# Patient Record
Sex: Female | Born: 1955 | Race: Black or African American | Hispanic: No | Marital: Married | Smoking: Never smoker
Health system: Southern US, Community
[De-identification: ages and names within clinical notes are randomized; demographics above are authoritative.]

## PROBLEM LIST (undated history)

## (undated) DIAGNOSIS — M797 Fibromyalgia: Secondary | ICD-10-CM

## (undated) DIAGNOSIS — F32A Depression, unspecified: Secondary | ICD-10-CM

## (undated) DIAGNOSIS — G629 Polyneuropathy, unspecified: Secondary | ICD-10-CM

## (undated) DIAGNOSIS — IMO0001 Reserved for inherently not codable concepts without codable children: Secondary | ICD-10-CM

## (undated) DIAGNOSIS — I639 Cerebral infarction, unspecified: Secondary | ICD-10-CM

## (undated) DIAGNOSIS — K2960 Other gastritis without bleeding: Secondary | ICD-10-CM

## (undated) DIAGNOSIS — R569 Unspecified convulsions: Secondary | ICD-10-CM

## (undated) DIAGNOSIS — T8859XA Other complications of anesthesia, initial encounter: Secondary | ICD-10-CM

## (undated) DIAGNOSIS — R202 Paresthesia of skin: Secondary | ICD-10-CM

## (undated) DIAGNOSIS — M1990 Unspecified osteoarthritis, unspecified site: Secondary | ICD-10-CM

## (undated) DIAGNOSIS — T4145XA Adverse effect of unspecified anesthetic, initial encounter: Secondary | ICD-10-CM

## (undated) DIAGNOSIS — K589 Irritable bowel syndrome without diarrhea: Secondary | ICD-10-CM

## (undated) DIAGNOSIS — I209 Angina pectoris, unspecified: Secondary | ICD-10-CM

## (undated) DIAGNOSIS — G4730 Sleep apnea, unspecified: Secondary | ICD-10-CM

## (undated) DIAGNOSIS — F329 Major depressive disorder, single episode, unspecified: Secondary | ICD-10-CM

## (undated) DIAGNOSIS — G7000 Myasthenia gravis without (acute) exacerbation: Secondary | ICD-10-CM

## (undated) DIAGNOSIS — G459 Transient cerebral ischemic attack, unspecified: Secondary | ICD-10-CM

## (undated) DIAGNOSIS — K219 Gastro-esophageal reflux disease without esophagitis: Secondary | ICD-10-CM

## (undated) DIAGNOSIS — E612 Magnesium deficiency: Secondary | ICD-10-CM

## (undated) DIAGNOSIS — R200 Anesthesia of skin: Secondary | ICD-10-CM

## (undated) DIAGNOSIS — Z9889 Other specified postprocedural states: Secondary | ICD-10-CM

## (undated) DIAGNOSIS — Z8719 Personal history of other diseases of the digestive system: Secondary | ICD-10-CM

## (undated) DIAGNOSIS — M5430 Sciatica, unspecified side: Secondary | ICD-10-CM

## (undated) DIAGNOSIS — Z8489 Family history of other specified conditions: Secondary | ICD-10-CM

## (undated) DIAGNOSIS — J45909 Unspecified asthma, uncomplicated: Secondary | ICD-10-CM

## (undated) HISTORY — DX: Fibromyalgia: M79.7

## (undated) HISTORY — PX: HAND SURGERY: SHX662

## (undated) HISTORY — DX: Magnesium deficiency: E61.2

## (undated) HISTORY — DX: Other gastritis without bleeding: K29.60

## (undated) HISTORY — PX: ABDOMINAL HYSTERECTOMY: SHX81

## (undated) HISTORY — PX: NECK SURGERY: SHX720

## (undated) HISTORY — DX: Polyneuropathy, unspecified: G62.9

## (undated) HISTORY — PX: NISSEN FUNDOPLICATION: SHX2091

## (undated) HISTORY — PX: CHOLECYSTECTOMY: SHX55

## (undated) HISTORY — PX: OTHER SURGICAL HISTORY: SHX169

## (undated) HISTORY — DX: Irritable bowel syndrome, unspecified: K58.9

## (undated) HISTORY — PX: BLADDER SURGERY: SHX569

## (undated) HISTORY — DX: Other specified postprocedural states: Z98.890

---

## 2005-04-28 ENCOUNTER — Ambulatory Visit: Payer: Self-pay | Admitting: Orthopedic Surgery

## 2008-07-18 ENCOUNTER — Ambulatory Visit: Payer: Self-pay | Admitting: Gastroenterology

## 2008-11-07 DIAGNOSIS — G4730 Sleep apnea, unspecified: Secondary | ICD-10-CM

## 2008-11-07 HISTORY — DX: Sleep apnea, unspecified: G47.30

## 2009-02-05 DIAGNOSIS — Z9109 Other allergy status, other than to drugs and biological substances: Secondary | ICD-10-CM | POA: Insufficient documentation

## 2009-02-05 DIAGNOSIS — IMO0001 Reserved for inherently not codable concepts without codable children: Secondary | ICD-10-CM | POA: Insufficient documentation

## 2009-02-05 DIAGNOSIS — G5790 Unspecified mononeuropathy of unspecified lower limb: Secondary | ICD-10-CM | POA: Insufficient documentation

## 2009-02-11 ENCOUNTER — Ambulatory Visit: Payer: Self-pay | Admitting: Gastroenterology

## 2009-02-11 DIAGNOSIS — K589 Irritable bowel syndrome without diarrhea: Secondary | ICD-10-CM | POA: Insufficient documentation

## 2009-06-02 ENCOUNTER — Ambulatory Visit: Payer: Self-pay | Admitting: Gastroenterology

## 2009-06-02 DIAGNOSIS — R1310 Dysphagia, unspecified: Secondary | ICD-10-CM

## 2009-06-02 DIAGNOSIS — R1319 Other dysphagia: Secondary | ICD-10-CM | POA: Insufficient documentation

## 2009-06-02 DIAGNOSIS — K6289 Other specified diseases of anus and rectum: Secondary | ICD-10-CM | POA: Insufficient documentation

## 2009-06-03 ENCOUNTER — Encounter: Payer: Self-pay | Admitting: Gastroenterology

## 2009-06-03 ENCOUNTER — Telehealth: Payer: Self-pay | Admitting: Gastroenterology

## 2009-06-04 ENCOUNTER — Encounter: Payer: Self-pay | Admitting: Gastroenterology

## 2009-06-09 ENCOUNTER — Ambulatory Visit (HOSPITAL_COMMUNITY): Admission: RE | Admit: 2009-06-09 | Discharge: 2009-06-09 | Payer: Self-pay | Admitting: Gastroenterology

## 2009-06-25 ENCOUNTER — Telehealth (INDEPENDENT_AMBULATORY_CARE_PROVIDER_SITE_OTHER): Payer: Self-pay

## 2009-06-26 ENCOUNTER — Encounter: Payer: Self-pay | Admitting: Gastroenterology

## 2009-09-01 ENCOUNTER — Ambulatory Visit: Payer: Self-pay | Admitting: Gastroenterology

## 2009-09-01 DIAGNOSIS — K219 Gastro-esophageal reflux disease without esophagitis: Secondary | ICD-10-CM | POA: Insufficient documentation

## 2009-09-21 ENCOUNTER — Telehealth (INDEPENDENT_AMBULATORY_CARE_PROVIDER_SITE_OTHER): Payer: Self-pay

## 2009-10-28 ENCOUNTER — Ambulatory Visit: Payer: Self-pay | Admitting: Gastroenterology

## 2009-12-18 ENCOUNTER — Ambulatory Visit: Payer: Self-pay | Admitting: Gastroenterology

## 2009-12-24 ENCOUNTER — Telehealth (INDEPENDENT_AMBULATORY_CARE_PROVIDER_SITE_OTHER): Payer: Self-pay

## 2010-04-30 ENCOUNTER — Encounter (INDEPENDENT_AMBULATORY_CARE_PROVIDER_SITE_OTHER): Payer: Self-pay | Admitting: *Deleted

## 2010-11-07 DIAGNOSIS — G459 Transient cerebral ischemic attack, unspecified: Secondary | ICD-10-CM

## 2010-11-07 HISTORY — DX: Transient cerebral ischemic attack, unspecified: G45.9

## 2010-12-07 NOTE — Letter (Signed)
Summary: [facility]  [facility]   Imported By: Diana Eves 06/26/2009 09:54:47  _____________________________________________________________________  External Attachment:    Type:   Image     Comment:   External Document

## 2010-12-07 NOTE — Letter (Signed)
Summary: Recall Office Visit  [facility]  [address]   [place], [place] [address]   Phone: [phone]  Fax: [phone]      April 30, 2010   [facility] [address] [place], [place]  [address] [birth year]   Dear [name],   According to our records, it is time for you to schedule a follow-up office visit with [place].   At your convenience, please call [phone] to schedule an office visit. If you have any questions, concerns, or feel that this letter is in error, we would appreciate your call.   Sincerely,    Diana Eves  [facility] Ph: [phone]   Fax: [phone]

## 2010-12-07 NOTE — Assessment & Plan Note (Signed)
Summary: IBS-CONSTIPATION/REFLUX   Visit Type:  Follow-up Visit Primary Care Provider:  Olena Leatherwood, M.D.  Chief Complaint:  abd pain.  History of Present Illness: No paion doctor. Came to see dr. Olena Leatherwood and he changed her to Aciphex. Taking [name] 2 at bedtime. Imipramine 3 at bedtime. BM: q2days, hard and soft stools. Pain: @navel  and in epigastrium. Giving hemorrhoids: pressure and they are coming out. No bleeding.   Current Medications (verified): 1)  Metanx 2.8-25-2 Mg Tabs (L-Methylfolate-B6-B12) .... Once Daily 2)  Promethazine Hcl 25 Mg Tabs (Promethazine Hcl) .... As Needed 3)  Premarin 0.625 Mg Tabs (Estrogens Conjugated) .... Take 1 Tablet By Mouth Once A Day 4)  Xyzal 5 Mg Tabs (Levocetirizine Dihydrochloride) .... Take 1 Tablet By Mouth Once A Day 5)  Citrucel .... As Needed 6)  Vitamin B-12 500 Mcg Lozg (Cyanocobalamin) .... Once Daily 7)  [name] 24 Mcg Caps (Lubiprostone) .... 2 At Bedtime 8)  Melatonin 5 Mg Caps (Melatonin) .... Once Daily 9)  Gas-X 80 Mg Chew (Simethicone) .... As Needed 10)  Triamcinolone Acetonide 0.1 % Crea (Triamcinolone Acetonide) .... Once Daily 11)  Restasis 0.05 % Emul (Cyclosporine) .... As Needed 12)  Astelin 137 Mcg/spray Soln (Azelastine Hcl) .... Two Times A Day 13)  Imipramine Hcl 20 Mg Tabs (Imipramine Hcl) .... Take Three Tablets At Bedtime 14)  Asa 81 Mg .... Take 1 Tablet By Mouth Once A Day 15)  Carafate 1 Gm/48ml Susp (Sucralfate) .... Two Tbls Before Meals 16)  Citrate of Magnesia  Soln (Magnesium Citrate) .... As Needed 17)  Zantac 150 Mg Tabs (Ranitidine Hcl) .... Take Two Tablets Daily As Needed 18)  Mucinex D [phone] Mg Xr12h-Tab (Pseudoephedrine-Guaifenesin) .... Take 1 Tablet By Mouth Two Times A Day 19)  Epi Pen .... As Directed 20)  Aciphex 20 Mg Tbec (Rabeprazole Sodium) .... Once Daily  Allergies (verified): 1)  ! Sulfa 2)  ! Penicillin 3)  ! Tetracycline 4)  ! Ultram 5)  ! Codeine 6)  ! Darvocet 7)  ! *  Macrolides 8)  ! * Metronidazol 9)  ! Keflex 10)  ! * Cymbalta 11)  ! Morphine 12)  ! 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 Linna Darner 2010  Past Surgical History: Last updated: 10/28/2009 Nissen Fundoplication, 1993 Choleystectomy in 9/09 Esophageal Surgery at [facility] in 2008 Hysterectomy 1986  Family History: Family History of Breast Cancer: aunt FH of Colon Cancer: 4 cousins  Social History: Disabled Married  Review of Systems       Long time ago saw a Camera operator.   Vital Signs:  Patient profile:   55 year old female Height:      63 inches Weight:      159 pounds BMI:     28.27 Temp:     98.3 degrees F oral Pulse rate:   72 / minute BP sitting:   110 / 70  (left arm) Cuff size:   regular  Vitals Entered By: Hendricks Limes LPN (December 18, 2009 8:37 AM)  Physical Exam  General:  Well developed, well nourished, no acute distress. Head:  Normocephalic and atraumatic. Mouth:  No deformity or lesions. Neck:  Supple; no masses. Lungs:  Clear throughout to auscultation. Heart:  Regular rate and rhythm; no murmurs Abdomen:  Soft, nondistended. Normal bowel sounds. Extremities:  No edema noted. Neurologic:  Alert and  oriented x4;  grossly  normal neurologically.  Impression & Recommendations:  Problem # 1:  IRRITABLE BOWEL, PREDOMINANTLY CONSTIPATION (ICD-564.1) Assessment Unchanged  Continues to c/o abd pain. No warning Sx/signs-weight stable. Refer to [facility], Dr. Tora Perches. Consider mental health visit. Explained stress may exacerbate all her Sx. Continue Imipramine, [name], and Aciphex. Call Dr. Linna Darner and ask about the Aciphex. OPV in 4 mos.  CC: Hasanaj, M.D.   Orders: Est. Patient Level III (60454) Prescriptions: [name] 24 MCG CAPS (LUBIPROSTONE) 1 by mouth BID  #60 x 5   Entered and Authorized  by:   West Bali MD   Signed by:   West Bali MD on 12/18/2009   Method used:   Electronically to        [facility] (retail)       [address]       [place], [place]  [address]       Ph: [phone]       Fax: [phone]   RxID:   [id number]   Appended Document: IBS-CONSTIPATION/REFLUX AUG 2010 [facility] EGD** no dilation**-MODERATE gastritis APR 2009 [facility] TCS: DR. Linna Darner NORMAL OCT 2008 [facility] EGD/removal of impacted food bolus-Dr. Linna Darner SEP 2008 [facility] LAP CHOLY  Appended Document: IBS-CONSTIPATION/REFLUX APPT [facility] MAR 11-2P

## 2010-12-07 NOTE — Assessment & Plan Note (Signed)
Summary: FU OV 6 MO,IBS/AMS   Visit Type:  Follow-up Consult Primary Care Provider:  Camelia Eng, M.D.  Chief Complaint:  follow up and still having problems with stomach.  History of Present Illness: Yesenia Boyd has IBS-constipation predominant. She continues to complain of abd pain, and constipation. [name] helps but cause abd cramps. She hasd a BW daily with [name]. She has stress caring for her grandchild who is with her in the office today. She complains of bloating and swelling. Cymbalta "almost took me out of here." She is not taking stool softeners or Cirtucel.  Current Medications (verified): 1)  Metanx 2.8-25-2 Mg Tabs (L-Methylfolate-B6-B12) .... Take 1 Tablet By Mouth Two Times A Day 2)  Lidoderm 5 % Ptch (Lidocaine) .... As Needed 3)  Promethazine Hcl 25 Mg Tabs (Promethazine Hcl) .... As Needed 4)  Premarin 0.625 Mg Tabs (Estrogens Conjugated) .... Take 1 Tablet By Mouth Once A Day 5)  Xyzal 5 Mg Tabs (Levocetirizine Dihydrochloride) .... Take 1 Tablet By Mouth Once A Day 6)  Clarinex 5 Mg Tabs (Desloratadine) .... Take 1 Tablet By Mouth Once A Day 7)  Nasonex 50 Mcg/act Susp (Mometasone Furoate) .... As Needed 8)  Carafate 1 Gm Tabs (Sucralfate) .... As Needed 9)  Citrucel .... As Needed 10)  Vitamin B-12 500 Mcg Lozg (Cyanocobalamin) .... Once Daily 11)  Xyzal 5 Mg Tabs (Levocetirizine Dihydrochloride) .... Once Daily 12)  [name] 24 Mcg Caps (Lubiprostone) .... Once Daily 13)  Vitamin D 60454 Unit Caps (Ergocalciferol) .... Q Month 14)  Baclofen 10 Mg Tabs (Baclofen) .... Q Hs 15)  Melatonin 5 Mg Caps (Melatonin) .... Once Daily 16)  Gas-X 80 Mg Chew (Simethicone) .... As Needed 17)  Tylenol Pm Extra Strength 500-25 Mg Tabs (Diphenhydramine-Apap (Sleep)) .... Once Daily 18)  Triamcinolone Acetonide 0.1 % Crea (Triamcinolone Acetonide) .... Once Daily  Allergies: 1)  ! Sulfa 2)  ! Penicillin 3)  ! Tetracycline 4)  ! Ultram 5)  ! Codeine 6)  ! Darvocet 7)  ! *  Macrolides 8)  ! * Metronidazol 9)  ! Keflex 10)  ! * Cymbalta 11)  ! Morphine 12)  ! 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 Head:  Normocephalic and atraumatic. Neck:  Supple; no masses or thyromegaly. Lungs:  Clear throughout to auscultation. Heart:  Regular rate and rhythm; no murmurs, rubs,  or bruits. Abdomen:  Soft, nontender but mild distention. No masses,  or hernias noted. Normal bowel sounds.  Impression & Recommendations:  Problem # 1:  IRRITABLE BOWEL SYNDROME, HX OF (ICD-V12.79) Assessment Unchanged She continues to be symptomatic. Could consider  adding a TCA. Discharge instructions given in writing. Drink water. Citrucel 1-2 times a day. Change [name] to 8 microg two times a day.  RPV in 4 months.  CC: Hasasnj, M.D. Prescriptions: [name] 8 MCG CAPS (LUBIPROSTONE) 1 by mouth bid  #60 x 3   Entered and Authorized by:   Jacques Navy MD   Signed by:   Jacques Navy MD on 02/11/2009   Method used:   Electronically to        [facility] (retail)       [address]       [place], [place]  [address]       Ph: [phone]       Fax: [phone]   RxID:   [id number]   Appended Document: Orders Update-charge    Clinical Lists Changes  Problems: Added new problem of  IRRITABLE BOWEL, PREDOMINANTLY CONSTIPATION (ICD-564.1) Orders: Added new Service order of Est. Patient Level III (81017) - Signed

## 2010-12-07 NOTE — Progress Notes (Signed)
Summary: pt wants referral  Phone Note Call from Patient Call back at Home Phone [phone]   Caller: Patient Summary of Call: pt called- would like referral to mental health. pt stated she would only go if she can get a private therapist that takes Medicare/BCBS. She said she would not go if it was in a "clinic". please advise Initial call taken by: Hendricks Limes LPN,  December 24, 2009 10:25 AM     Appended Document: pt wants referral Refer pt for Mental Health.  Appended Document: pt wants referral I called the pt. and told her I could refer her to Mental Health in [place] or [facility] in [place].Marland KitchenMarland KitchenShe stated she didn't want to be seen at either place.Marland KitchenShe will just deal with it herself.

## 2010-12-07 NOTE — Letter (Signed)
Summary: BARIUM SWALLOW ORDER  BARIUM SWALLOW ORDER   Imported By: Ave Filter 06/04/2009 08:02:44  _____________________________________________________________________  External Attachment:    Type:   Image     Comment:   External Document  Appended Document: BARIUM SWALLOW ORDER Pt is scheduled for 06/18/09@ 9:30am  Pt is aware of appt.  Appended Document: BARIUM SWALLOW ORDER Pt is rescheduled to 06/09/09@12 :30 pm

## 2010-12-07 NOTE — Assessment & Plan Note (Signed)
Summary: fu ov 4 mo,ibs,constipation/ams   Visit Type:  Follow-up Visit Primary Care Provider:  Olena Leatherwood, M.D.  Chief Complaint:  follow up- having problems with constipation and reflux and hemorroids.  History of Present Illness: Citrucel causes bloating. Doesn't really move her. Couldn't take the [name] 8 twice a day-gives her hemorrhoids. 24 once a day pushes out. MgCitrate-2x/week (1/2 btl). Burning in her esophagus and stomach. Been happening for a week. Going through changes: sickness on and off, never having a good day, problems at home. "Myasthenia Graves? has been messing with her". BM as long as with [name]. TCS: Linna Darner in past 5 years. Last BM yesterday. Feels like hemorrhoid coming out. Pushes it back up and  it helps.   Current Medications (verified): 1)  Metanx 2.8-25-2 Mg Tabs (L-Methylfolate-B6-B12) .... Take 1 Tablet By Mouth Two Times A Day 2)  Lidoderm 5 % Ptch (Lidocaine) .... As Needed 3)  Promethazine Hcl 25 Mg Tabs (Promethazine Hcl) .... As Needed 4)  Premarin 0.625 Mg Tabs (Estrogens Conjugated) .... Take 1 Tablet By Mouth Once A Day 5)  Xyzal 5 Mg Tabs (Levocetirizine Dihydrochloride) .... Take 1 Tablet By Mouth Once A Day 6)  Clarinex 5 Mg Tabs (Desloratadine) .... Take 1 Tablet By Mouth Once A Day 7)  Nasonex 50 Mcg/act Susp (Mometasone Furoate) .... As Needed 8)  Carafate 1 Gm Tabs (Sucralfate) .... As Needed 9)  Citrucel .... As Needed 10)  Vitamin B-12 500 Mcg Lozg (Cyanocobalamin) .... Once Daily 11)  Xyzal 5 Mg Tabs (Levocetirizine Dihydrochloride) .... Once Daily 12)  [name] 8 Mcg Caps (Lubiprostone) .Marland Kitchen.. 1 By Mouth Bid 13)  Vitamin D 16[address] Unit Caps (Ergocalciferol) .... Q Month 14)  Baclofen 10 Mg Tabs (Baclofen) .... Q Hs 15)  Melatonin 5 Mg Caps (Melatonin) .... Once Daily 16)  Gas-X 80 Mg Chew (Simethicone) .... As Needed 17)  Tylenol Pm Extra Strength 500-25 Mg Tabs (Diphenhydramine-Apap (Sleep)) .... Once Daily 18)  Triamcinolone Acetonide 0.1 %  Crea (Triamcinolone Acetonide) .... Once Daily 19)  Nexium 40 Mg Cpdr (Esomeprazole Magnesium) .Marland Kitchen.. 1 By Mouth 30 Minutes Before First Meal.  Allergies (verified): 1)  ! Sulfa 2)  ! Penicillin 3)  ! Tetracycline 4)  ! Ultram 5)  ! Codeine 6)  ! Darvocet 7)  ! * Macrolides 8)  ! * Metronidazol 9)  ! Keflex 10)  ! * Cymbalta 11)  ! Morphine 12)  ! 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 Head:  Normocephalic and atraumatic. Eyes:  PERRLA, no icterus. Mouth:  No deformity or lesions, dentition poor. Lungs:  Clear throughout to auscultation. Heart:  Regular rate and rhythm; no murmurs, rubs,  or bruits. Abdomen:  Soft, mild TTP in epigastrium and nondistended.  Normal bowel sounds.  Impression & Recommendations:  Problem # 1:  IRRITABLE BOWEL, PREDOMINANTLY CONSTIPATION (ICD-564.1) Assessment Improved Sx not ideally controlled. Add Imipramine 10 mg at  bedtime for 3 days the 20 mg at bedtime x3 days the 30 mg at bedtime. Continue [name] and MgCitrate. OPV in 3 months. Pt shoud consider mental health referrals. Declined today.  Problem # 2:  DYSPHAGIA UNSPECIFIED (ICD-787.20) Assessment: New P reportsprior Hx: esophageal motility disorder and Nissen. Records not available. Obtain reports from [facility] and [facility]. BaSw and EGD/dil if needed. Continue Nexium. Add imipramine.  CC: Hasanaj, M.D.  Problem # 3:  RECTAL PAIN (ICD-569.42) Pt reports prior TCS and c/o prolapsed hemorrhoid which can be reduced . Anusol HC  supp q12h for 7 days. Obtain TCS report from [facility]. Prescriptions: ANUSOL-HC 25 MG SUPP (HYDROCORTISONE ACETATE) 1 pr q12 for 7 days  #14 x 1   Entered and Authorized by:   Jacques Navy MD   Signed by:   Jacques Navy MD on 06/02/2009   Method used:   Electronically to        [facility] (ret[facility])       [address]       [place], [place]  [address]       Ph: [phone]       Fax: [phone]   RxID:   [id number] IMIPRAMINE HCL 10 MG TABS (IMIPRAMINE HCL) 1 by mouth at bedtime for 3 days then 2 by mouth at bedtime for 3 days, then 3 by mouth qhs  #90 x 5   Entered and Authorized by:   Jacques Navy MD   Signed by:   Jacques Navy MD on 06/02/2009   Method used:   Electronically to        [facility] (retail)       [address]       [place], [place]  [address]       Ph: [phone]       Fax: [phone]   RxID:   [id number]   Appended Document: Orders Update-charge    Clinical Lists Changes  Orders: Added new Service order of Est. Patient Level IV (35573) - Signed

## 2010-12-07 NOTE — Assessment & Plan Note (Signed)
Summary: FU OV 3 MO,DYSPHAGIA,IBS-C/AMS   Visit Type:  Follow-up Visit Primary Care Provider:  Olena Leatherwood, M.D.  Chief Complaint:  F/U dysphagia.  History of Present Illness: Still refluxing and soreness in stomach. For last couple weeks more irritable. Not caring for grandchild today. Not using Phenergan at all. Has a lot cold and mucous drainage. Pt has problems with varicose vein, a TIA, and Myasthenia Gravis. Taking the Imipramine and things are better: calmed down some. Taking Nexium twice a day. Using Zantac for itching. BM: slowed up lately, using MgCitrate as needed. "[name] 24s don't cause cramping but the 8s do".  Current Medications (verified): 1)  Metanx 2.8-25-2 Mg Tabs (L-Methylfolate-B6-B12) .... Take 1 Tablet By Mouth Two Times A Day 2)  Lidoderm 5 % Ptch (Lidocaine) .... As Needed 3)  Promethazine Hcl 25 Mg Tabs (Promethazine Hcl) .... As Needed 4)  Premarin 0.625 Mg Tabs (Estrogens Conjugated) .... Take 1 Tablet By Mouth Once A Day 5)  Xyzal 5 Mg Tabs (Levocetirizine Dihydrochloride) .... Take 1 Tablet By Mouth Once A Day 6)  Citrucel .... As Needed 7)  Vitamin B-12 500 Mcg Lozg (Cyanocobalamin) .... Once Daily 8)  [name] 24 Mcg Caps (Lubiprostone) .... Once Daily 9)  Vitamin D 16109 Unit Caps (Ergocalciferol) .... Q Month 10)  Melatonin 5 Mg Caps (Melatonin) .... Once Daily 11)  Gas-X 80 Mg Chew (Simethicone) .... As Needed 12)  Triamcinolone Acetonide 0.1 % Crea (Triamcinolone Acetonide) .... Once Daily 13)  Nexium 40 Mg Cpdr (Esomeprazole Magnesium) .... Take 1 Tablet By Mouth Two Times A Day 14)  Restasis 0.05 % Emul (Cyclosporine) .Marland Kitchen.. 1 Gtt Bid 15)  Astelin 137 Mcg/spray Soln (Azelastine Hcl) .... Two Times A Day 16)  Imipramine Hcl 20 Mg Tabs (Imipramine Hcl) .... Take Three Tablets At Bedtime 17)  Asa 81 Mg .... Take 1 Tablet By Mouth Once A Day 18)  Carafate 1 Gm/14ml Susp (Sucralfate) .... Two Tbls Before Meals 19)  Citrate of Magnesia  Soln (Magnesium  Citrate) .... As Needed 20)  Lcd 10% Sal/ Acid2%, Triam Cream .... As Directed 21)  Benzaclin 1-5 % Gel (Clindamycin Phos-Benzoyl Perox) .... As Directed 22)  Clobetasol Propionate 0.05 % Crea (Clobetasol Propionate) .... As Directed 23)  Refresh Tears 0.5 % Soln (Carboxymethylcellulose Sodium) .... As Directed 24)  Cetaphil Gentle Cleansing  Bar (Soap & Cleansers) .... As Directed 25)  Zantac 150 Mg Tabs (Ranitidine Hcl) .... Take Two Tablets Daily As Needed 26)  Mucinex D [phone] Mg Xr12h-Tab (Pseudoephedrine-Guaifenesin) .... Take 1 Tablet By Mouth Two Times A Day 27)  Epi Pen .... As Directed  Allergies (verified): 1)  ! Sulfa 2)  ! Penicillin 3)  ! Tetracycline 4)  ! Ultram 5)  ! Codeine 6)  ! Darvocet 7)  ! * Macrolides 8)  ! * Metronidazol 9)  ! Keflex 10)  ! * Cymbalta 11)  ! Morphine 12)  ! 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 ANWAR 2010  Vital Signs:  Patient profile:   55 year old female Height:      63 inches Weight:      158 pounds BMI:     28.09 Temp:     98.7 degrees F oral Pulse rate:   64 / minute BP sitting:   112 / 82  (left arm) Cuff size:   regular  Vitals Entered By: Cloria Spring LPN (September 01, 2009  2:32 PM)  Physical Exam  General:  Well developed, well nourished, no acute distress. Head:  Normocephalic and atraumatic. Lungs:  Clear throughout to auscultation. Heart:  Regular rate and rhythm; no murmurs, rubs,  or bruits. Abdomen:  Soft, nondistended. Normal bowel sounds. Mild epigastric tenderness without guarding, and without rebound.    Impression & Recommendations:  Problem # 1:  IRRITABLE BOWEL, PREDOMINANTLY CONSTIPATION (ICD-564.1) Assessment Improved Continue Imipramine and [name]. May use [name] twice daily. MgCitrate as needed. OPV in 4 mos.  Problem # 2:  GERD (ICD-530.81) Assessment: Improved May  use Nexium two times a day. OPV in 4 mos.  CC: Dr. Olena Leatherwood  Patient Instructions: 1)  May Use [name] 1-2 times a day according to how often you are having a BM. 2)  Use Nexium two times a day 30 minutes prior to breakfast and supper.  3)  Continue Imipramine.  4)  RETURN VISIT IN 4 MOS. 5)  The medication list was reviewed and reconciled.  All changed / newly prescribed medications were explained.  A complete medication list was provided to the patient / caregiver. Prescriptions: [name] 24 MCG CAPS (LUBIPROSTONE) 1 by mouth bid  #60 x 5   Entered and Authorized by:   West Bali MD   Signed by:   West Bali MD on 09/01/2009   Method used:   Electronically to        [facility] (ret[facility])       [address]       [place], [place]  [address]       Ph: [phone]       Fax: [phone]   RxID:   [id number] NEXIUM 40 MG CPDR (ESOMEPRAZOLE MAGNESIUM) Take 1 tablet by mouth 30 minutes priro to breakfast and supper  #60 x 5   Entered and Authorized by:   West Bali MD   Signed by:   West Bali MD on 09/01/2009   Method used:   Electronically to        [facility]       [address]       [place], [place]  [address]       Ph: [phone]       Fax: [phone]   RxID:   [id number]       Appended Document: Orders Update-charge    Clinical Lists Changes  Orders: Added new Service order of Est. Patient Level III ([address]301) - Signed

## 2010-12-07 NOTE — Progress Notes (Signed)
Summary: fyi from pt  Phone Note Call from Patient Call back at [facility] Phone [phone]   Caller: Patient Summary of Call: pt called- wanted to let [facility] know that she had an EGD/ED with Bx done recently by Dr. Linna Darner. He put her on Carafate and increased her nexium to two times a day.. Pt stated she had her Nissan wrap done at [facility] and wanted to make sure her records were here (records are here in [facility] box.). She also stated she has pictures of her Nissan wrap if you wanted to see them, she would bring them by here. Initial call taken by: Hendricks Limes LPN,  June 25, 2009 11:26 AM     Appended Document: fyi from pt Please call pt. I reviewed her information from [facility]. I will continue to manage her IBS-c but Dr. Linna Darner will need to manage her reflux & difficulty swallowing.  Appended Document: fyi from pt pt aware

## 2010-12-07 NOTE — Medication Information (Signed)
Summary: Tax adviser   Imported By: Diana Eves 06/03/2009 09:12:43  _____________________________________________________________________  External Attachment:    Type:   Image     Comment:   External Document  Appended Document: RX Folder Please call pharmacy. Ok to substitute Anucort HC.  Appended Document: RX Chief Strategy Officer

## 2010-12-07 NOTE — Letter (Signed)
Summary: [facility] Referral  [facility] Referral   Imported By: Ave Filter 12/18/2009 09:36:13  _____________________________________________________________________  External Attachment:    Type:   Image     Comment:   External Document

## 2010-12-07 NOTE — Progress Notes (Signed)
  Phone Note Call from Patient   Caller: Patient Summary of Call: Pt called and said she had some "meat like thing" come out of her rectum with the hemorrhoid. But when her  hemorrhoid moved it moved that thing back up inside of her rectum. She has not had pain, just pressure, but  has just contributed it all to her hemorrhoids. Please advise. She can be reached this afternoon on her cell ([phone]. Initial call taken by: Cloria Spring LPN,  September 21, 2009 12:47 PM     Appended Document:  Needs OV  Appended Document:  Pt informed. I told her if she needs to be seen before we can get her in to please let her PCP know, and she said she would.  Appended Document:  APPT MADE W/ LL ON 12/22@1 :30- PT AWARE- [facility]

## 2011-03-22 NOTE — Assessment & Plan Note (Signed)
Yesenia Boyd, Yesenia Boyd                   CHART#:  [id number]   DATE:  07/18/2008                       DOB:  January 13, 1956   REFERRING PHYSICIAN:  Dr. Lia Hopping   REASON FOR CONSULTATION:  Abdominal pain and reflux.   HISTORY OF PRESENT ILLNESS:  Yesenia Boyd is a 55 year old female who has a  longstanding history of functional gut disorder.  She reports her  esophagus does not move well, her stomach or intestines.  She was  diagnosed with irritable bowel syndrome and was seen and evaluated at  [facility] by Dr. Enrigue Catena.  Her last visit with him was in 2005.  She reports being on multiple medication and decided she needed to be  off this pain medicines and so she self-detoxed.  She still complains  of problems with pressure in her lower abdomen.  She said that it sounds  like her sigmoid colon is tortuous.  She is on Aciphex twice a day for  reflux.  She describes her reflux as a sensation of fluids feeling like  pushing up in her esophagus and a fullness in her abdomen and chest.  She stays sore in her abdomen.  If she gets agitated, her pain gets  worse.  She has bowel movement on a daily basis.  She uses stool  softeners and Citrucel (probably).  She only eats chicken and fish.  She  has been taking Carafate a very long time.  She is here for her flares.  She denies any vomiting.  She has not had used Phenergan in a while for  her nausea.  She desires to minimize the use of medications.   PAST MEDICAL HISTORY:  1. Irritable bowel syndrome, constipation predominant.  2. Fibromyalgia.  3. Allergies.  4. Neuropathy in her feet.  5. Report a history of myasthenia gravis.   PAST SURGICAL HISTORY:  1. Nissen fundoplication.  2. Cholecystectomy in September 2009 due to stones.  3. Esophageal surgery at [facility] in 2008.  4. Hysterectomy in 1986.   ALLERGIES:  Sulfa, penicillin, tetracycline, Ultram, codeine, Darvocet,  and macrolides.   MEDICATIONS:  1.  Metanx.  2. Lidoderm patch.  3. Phenergan.  4. Premarin.  5. Xyzal.  6. Aciphex 2 daily.  7. Clarinex.  8. Nasonex.  9. Stool softener.  10.Citrucel.   FAMILY HISTORY:  She has four cousins with colon cancer.  Her aunt has  breast cancer.   SOCIAL HISTORY:  She has been married twice.  The first marriage lasted  13 years and her second is currently 8 years.  She is disabled.  She  does not smoke or drink.   REVIEW OF SYSTEMS:  As per the HPI, otherwise all systems are negative.   PHYSICAL EXAMINATION:  VITAL SIGNS:  Weight 158 pounds, height 5 foot 3  inches, BMI 27.8 (overweight), temperature 98.4, blood pressure 110/78,  pulse 72.GENERAL:  She is in no apparent distress.  Alert and oriented  x4.HEENT:  Atraumatic, normocephalic.  Pupils equal and reactive to  light.  Mouth, no oral lesions.  Posterior pharynx without erythema or  exudate.NECK:  Full range of motion.  No lymphadenopathy.LUNGS:  Clear  to auscultation bilaterally.CARDIOVASCULAR:  Regular rhythm.  No murmur.  Normal S1 and S2.ABDOMEN:  Bowel sounds are present. <Her abdomen is  soft, nondistended  with mild tenderness to palpation in all four  quadrants without rebound or guarding.EXTREMITIES:  No cyanosis or  edema.NEUROLOGIC:  She has no focal neurologic deficits.   ASSESSMENT:  Yesenia Boyd is a 55 year old female who has a functional gut  disorder.  Her symptoms seemed to be fairly well controlled at this  point.  She has been seen and evaluated in [facility]  and [place]. Thank you for allowing me to see Yesenia Boyd in  consultation.  My recommendations follow.   RECOMMENDATIONS:  1. Will obtain records from [facility] and [facility].  2. She should continue the Aciphex for heartburn and indigestion.  She      should continue stool softeners and the Citrucel for her      constipation.  3. I explained to her that with irritable bowel syndrome and      fibromyalgia,  she is most likely always going to have abdominal      pain and should let me know if it often becomes worse.  4. She will follow up with me every 6 months.         Kassie Mends, M.D.  Electronically Signed     SM/MEDQ  D:  07/18/2008  T:  07/19/2008  Job:  [id number]   cc:   Lia Hopping

## 2011-06-20 ENCOUNTER — Ambulatory Visit (INDEPENDENT_AMBULATORY_CARE_PROVIDER_SITE_OTHER): Payer: BC Managed Care – PPO | Admitting: Gastroenterology

## 2011-06-20 ENCOUNTER — Other Ambulatory Visit: Payer: Self-pay | Admitting: General Practice

## 2011-06-20 ENCOUNTER — Encounter: Payer: Self-pay | Admitting: Gastroenterology

## 2011-06-20 VITALS — BP 127/74 | HR 60 | Temp 97.7°F | Ht 63.0 in | Wt 146.0 lb

## 2011-06-20 DIAGNOSIS — R1310 Dysphagia, unspecified: Secondary | ICD-10-CM

## 2011-06-20 DIAGNOSIS — K625 Hemorrhage of anus and rectum: Secondary | ICD-10-CM

## 2011-06-20 NOTE — Patient Instructions (Signed)
Please have labs done. We will contact you with results.  We will plan on a colonoscopy in the near future. We may be adding an endoscopy as well. We will let you know after discussion with Dr. Darrick Penna.  We will be in touch shortly.

## 2011-06-20 NOTE — Progress Notes (Signed)
Referring Provider: No ref. provider found Primary Care Physician:  Toma Deiters, MD Primary Gastroenterologist: Dr. Darrick Penna   Chief Complaint  Patient presents with  . Dysphagia  . Rectal Bleeding    and pressure    HPI:   [name] is a 55 year old female with a history significant for myasthenia gravis, GERD, and IBS. She was last seen by [place] in July of last year. Was on imipramine, [name], Aciphex at that time. She returns today with concerns of epigastric pain and intermittent brbpr. Interestingly, she states she has a consultation with Dr. Gabriel Cirri this Friday for a possible colonoscopy. I told her it would be best to group her GI care, and I asked if she still wanted to be followed by [place]. She would like to stay with [place] and cancel the upcoming appt with him.   She reports recent hospitalization at [facility] due to myasthenia gravis. She states she had hemoccult cards with her at the hospital, and she checked her stool herself. She reports this was heme +. I have no documentation of this to verify. We also do not have the records from [place] at the time of this visit. She is not quite clear about past history, but she does state she has "intermittent blockages", which sounds like significant constipation from her report.   Reports intermittent brbpr. We have not actually performed a colonoscopy at our facility; prior procedure done by Dr. Linna Darner reportedly normal in 2009  Takes 24 mcg [name] each night. Using mag citrate as well. Says gets hot and fast heart. But happens without [name]. Says gets a hot spell, then has a BM.   Also reports worsening dysphagia for a few months, has to crush pills, has to have gravy or butter, some type of sauce to help with food sliding down. Has to cut up in very small bites. +epigastric soreness, intermittent, for last month. Feels relief after BM. No NSAIDS or aspirin powders.  Has been seen at [facility] in March 2011 for myasthenia gravis. Aug 2010  UGI: small hiatal hernia, loose wrap (s/p Nissen)  Past Medical History  Diagnosis Date  . IBS (irritable bowel syndrome)   . Fibromyalgia   . Peripheral neuropathy   . Myasthenia gravis   . S/P endoscopy 2008, Aug 2010    Dr. Linna Darner 2008: removal of impacted food bolus, Dr. Linna Darner 2010: moderate gastritis  . S/P colonoscopy April 2009    normal Dr. Linna Darner    Past Surgical History  Procedure Date  . Nissen fundoplication   . Cholecystectomy   . Abdominal hysterectomy   . Esophageal surgery?     2008 [facility]  . Hand surgery   . Neck surgery   . Bladder surgery     Current Outpatient Prescriptions  Medication Sig Dispense Refill  . acetaminophen (TYLENOL) 650 MG CR tablet Take 650 mg by mouth every 8 (eight) hours as needed.        Marland Kitchen azelastine (OPTIVAR) 0.05 % ophthalmic solution 1 drop 2 (two) times daily.        . cimetidine (TAGAMET) 200 MG tablet Take 200 mg by mouth 4 (four) times daily.        . cycloSPORINE (RESTASIS) 0.05 % ophthalmic emulsion 1 drop 2 (two) times daily.        Marland Kitchen esomeprazole (NEXIUM) 40 MG capsule Take 40 mg by mouth daily before breakfast.        . estrogens, conjugated, (PREMARIN) 0.625 MG tablet Take 0.625 mg by  mouth daily. Take daily for 21 days then do not take for 7 days.       Marland Kitchen L-Methylfolate-B6-B12 (METANX PO) Take by mouth daily.        Marland Kitchen levocetirizine (XYZAL) 5 MG tablet Take 5 mg by mouth every evening.        . lubiprostone ([name]) 24 MCG capsule Take 24 mcg by mouth 2 (two) times daily with a meal.        . magnesium citrate solution Take 296 mLs by mouth once.        . Olopatadine HCl (PATADAY) 0.2 % SOLN Apply to eye.        . simethicone (GAS-X) 80 MG chewable tablet Chew 80 mg by mouth every 6 (six) hours as needed.        . sucralfate (CARAFATE) 1 GM/10ML suspension Take 1 g by mouth 4 (four) times daily.        Marland Kitchen UNABLE TO FIND Med Name: triamicinolone acetonide cream 0.1% prn        . UNABLE TO FIND Med Name: LCD10% sal acid 2 %  traim. Use as directed       . UNKNOWN TO PATIENT OXYGEN      2L        At bed time instead of C-Pap for sleep apnea.         Allergies as of 06/20/2011 - reviewed 02/11/2009  Allergen Reaction Noted  . Avelox (moxifloxacin hcl in nacl)  06/20/2011  . Cephalexin    . Codeine    . Cymbalta (duloxetine hcl)  06/20/2011  . Dicyclomine  06/20/2011  . Duloxetine    . Meperidine hcl    . Metronidazole (metrocream)  06/20/2011  . Morphine    . Penicillins    . Prednisone  06/20/2011  . Sulfonamide derivatives    . Tetracycline    . Tramadol hcl      Family History  Problem Relation Age of Onset  . Colon cancer Cousin     4 cousins    History   Social History  . Marital Status: Married    Spouse Name: N/A    Number of Children: N/A  . Years of Education: N/A   Social History Main Topics  . Smoking status: Never Smoker   . Smokeless tobacco: Not on file  . Alcohol Use: No  . Drug Use: Not on file  . Sexually Active: Not on file    Review of Systems: Gen: Denies fever, chills, anorexia. Denies fatigue, weakness, weight loss.  CV: Denies chest pain, palpitations, syncope, peripheral edema, and claudication. Resp: Denies dyspnea at rest, cough, wheezing, coughing up blood, and pleurisy. GI: Denies vomiting blood, jaundice, and fecal incontinence.   Denies odynophagia. + odynophagia.  Derm: Denies rash, itching, dry skin Psych: Denies depression, anxiety, memory loss, confusion. No homicidal or suicidal ideation.  Heme: Denies bruising, bleeding, and enlarged lymph nodes.  Physical Exam: BP 127/74  Pulse 60  Temp(Src) 97.7 F (36.5 C) (Temporal)  Ht 5\' 3"  (1.6 m)  Wt 146 lb (66.225 kg)  BMI 25.86 kg/m2 General:   Alert and oriented. No distress noted. Pleasant and cooperative.  Head:  Normocephalic and atraumatic. Eyes:  Conjuctiva clear without scleral icterus. Mouth:  Oral mucosa pink and moist. Good dentition. No lesions. Neck:  Supple, without mass or  thyromegaly. Heart:  S1, S2 present without murmurs, rubs, or gallops. Regular rate and rhythm. Abdomen:  +BS, soft, mildly tender to palpation epigastric region.  No rebound or guarding. No HSM or masses noted. Msk:  Symmetrical without gross deformities. Normal posture. Extremities:  Without edema. Neurologic:  Alert and  oriented x4;  grossly normal neurologically. Skin:  Intact without significant lesions or rashes. Cervical Nodes:  No significant cervical adenopathy. Psych:  Alert and cooperative. Normal mood and affect.

## 2011-06-21 ENCOUNTER — Telehealth: Payer: Self-pay

## 2011-06-21 LAB — CBC WITH DIFFERENTIAL/PLATELET
Basophils Absolute: 0 10*3/uL (ref 0.0–0.1)
Basophils Relative: 0 % (ref 0–1)
Eosinophils Absolute: 0 10*3/uL (ref 0.0–0.7)
Eosinophils Relative: 1 % (ref 0–5)
HCT: 35.1 % — ABNORMAL LOW (ref 36.0–46.0)
Hemoglobin: 11.5 g/dL — ABNORMAL LOW (ref 12.0–15.0)
Lymphocytes Relative: 59 % — ABNORMAL HIGH (ref 12–46)
Lymphs Abs: 2.7 10*3/uL (ref 0.7–4.0)
MCH: 26.4 pg (ref 26.0–34.0)
MCHC: 32.8 g/dL (ref 30.0–36.0)
MCV: 80.7 fL (ref 78.0–100.0)
Monocytes Absolute: 0.3 10*3/uL (ref 0.1–1.0)
Monocytes Relative: 6 % (ref 3–12)
Neutro Abs: 1.6 10*3/uL — ABNORMAL LOW (ref 1.7–7.7)
Neutrophils Relative %: 35 % — ABNORMAL LOW (ref 43–77)
Platelets: 284 10*3/uL (ref 150–400)
RBC: 4.35 MIL/uL (ref 3.87–5.11)
RDW: 13.2 % (ref 11.5–15.5)
WBC: 4.7 10*3/uL (ref 4.0–10.5)

## 2011-06-21 NOTE — Telephone Encounter (Signed)
PT NEEDS CPAP FOR PROCEDURE

## 2011-06-21 NOTE — Telephone Encounter (Signed)
Pt said she does not have a C-Pap machine. Said she uses O2 instead. Said Dr. Gerilyn Pilgrim is the doctor who is treating her for the sleep apnea.

## 2011-06-21 NOTE — Telephone Encounter (Signed)
Pt called and said that she is scheduled for colonoscopy and EGD on Friday this week. She said she forgot to tell us that she has to be on 2% oxygen whenever she has any procedures done. (She has sleep apnea but has never been able to get fitted perfectly with C-pap machine so therefore is suppose to use 2% oxygen. She did say that she does not use it very often, but wanted to make Dr. Darrick Penna aware, said if she did not have it she might not get roused up after medications for procedure. ( I'm adding Oxygen to the medication list).

## 2011-06-22 ENCOUNTER — Encounter: Payer: Self-pay | Admitting: Gastroenterology

## 2011-06-22 DIAGNOSIS — K625 Hemorrhage of anus and rectum: Secondary | ICD-10-CM | POA: Insufficient documentation

## 2011-06-22 NOTE — Assessment & Plan Note (Signed)
54 year old female with reports of intermittent brbpr, last colonoscopy by Dr. Linna Darner in 2009 listed as normal in medical records. States she checked her own stool with hemoccult cards while hospitalized for myasthenia gravis, and they were positive. Do not have confirmation of this, nor do I have the recent records with labs. She has hx of constipation, IBS, and worsening epigastric soreness for the past month that is slightly relieved after BM. Remote family hx of colon cancer (cousins). Doubt occult malignancy at this time, likely hematochezia due to benign anorectal source. Although last colonoscopy was in 2009, it was normal, and we have not performed one at our facility. We will proceed with a colonoscopy in the near future, obtain an updated CBC, and request the reports from [facility] in March 2011 for our records.    Proceed with colonoscopy with Dr. Darrick Penna in the near future. The risks, benefits, and alternatives have been discussed in detail with the patient. They state understanding and desire to proceed.

## 2011-06-22 NOTE — Assessment & Plan Note (Signed)
Worsening dysphagia over past few months with need for crushing pills, eating foods that have sauces/gravy to facilitate swallowing. Last EGD with Dr. Linna Darner in 2010, moderate gastritis. Has hx of impacted food bolus in 2008, EGD by Dr. Linna Darner at that time. Question of underlying Schatzki's ring, web, or stricture. Epigastric pain seems to be relieved by defecation; unable to exclude underlying gastritis/PUD. Will proceed with EGD at time of colonoscopy.   Proceed with upper endoscopy/dilation in the near future with Dr. Darrick Penna. The risks, benefits, and alternatives have been discussed in detail with patient. They have stated understanding and desire to proceed.

## 2011-06-22 NOTE — Progress Notes (Signed)
agree

## 2011-06-22 NOTE — Telephone Encounter (Signed)
Pt called back and said that she is getting very stressed over this situation about the C-Pap. She would like a call from Dr. Darrick Penna to her. Said she just does not want to be forced to do something that she does not want to do. York Spaniel this worrying over the situation causes a flare of her myasthenia gravis.

## 2011-06-22 NOTE — Telephone Encounter (Signed)
CALLED PT. LVM to call me RE: endo.

## 2011-06-22 NOTE — Telephone Encounter (Signed)
WE WILL PROVIDE CPAP.

## 2011-06-22 NOTE — Progress Notes (Signed)
Cc to PCP 

## 2011-06-23 ENCOUNTER — Other Ambulatory Visit: Payer: Self-pay | Admitting: General Practice

## 2011-06-23 NOTE — Telephone Encounter (Signed)
Pt is scheduled for procedure 07/18/2011. Instructions placed in the mail. Pt will come by and pick up prep kit at the office on the day of her pre-op visit 07/13/2011.

## 2011-06-23 NOTE — Telephone Encounter (Signed)
Called pt. Concerned about O2. Pt states she can't use a CPAP. When she goes to sleep she can't wake up. Hard to sedate. Pt has myasthenia gravis and last "attack" in JUL. Associated with dysphagia. Pt had BPE: sml hiatal hernia in 2010. LAST TCS 2009 ANWAR & EGD IN 2008/2010. RSC SEP 10-PICK UP MOVI-PREP SAMPLE. Pt declined Rx for hemorrhoids. Last CBC AUG 13 HB 11.5.

## 2011-06-23 NOTE — Telephone Encounter (Signed)
Pt left VM that she was at optomotrist's office yesterday and could not answer phone. Said please return call today.

## 2011-06-24 ENCOUNTER — Ambulatory Visit (HOSPITAL_COMMUNITY)
Admission: RE | Admit: 2011-06-24 | Payer: BC Managed Care – PPO | Source: Ambulatory Visit | Admitting: Gastroenterology

## 2011-06-24 ENCOUNTER — Encounter (HOSPITAL_COMMUNITY): Admission: RE | Payer: Self-pay | Source: Ambulatory Visit

## 2011-06-24 SURGERY — COLONOSCOPY
Anesthesia: Moderate Sedation

## 2011-06-27 NOTE — Progress Notes (Signed)
Pt informed

## 2011-06-27 NOTE — Progress Notes (Signed)
Quick Note:  Pt informed ______ 

## 2011-07-12 NOTE — Patient Instructions (Signed)
20 Yesenia Boyd  07/12/2011   Your procedure is scheduled on:  07/18/2011  Report to [facility] at  615  AM.  Call this number if you have problems the morning of surgery: [phone]   Remember:   Do not eat food:After Midnight.  Do not drink clear liquids: After Midnight.  Take these medicines the morning of surgery with A SIP OF WATER: tagamet,Nexium,carafate   Do not wear jewelry, make-up or nail polish.  Do not wear lotions, powders, or perfumes. You may wear deodorant.  Do not shave 48 hours prior to surgery.  Do not bring valuables to the hospital.  Contacts, dentures or bridgework may not be worn into surgery.  Leave suitcase in the car. After surgery it may be brought to your room.  For patients admitted to the hospital, checkout time is 11:00 AM the day of discharge.   Patients discharged the day of surgery will not be allowed to drive home.  Name and phone number of your driver: family  Special Instructions: N/A   Please read over the following fact sheets that you were given: Pain Booklet, Surgical Site Infection Prevention, Anesthesia Post-op Instructions and Care and Recovery After Surgery PATIENT INSTRUCTIONS POST-ANESTHESIA  IMMEDIATELY FOLLOWING SURGERY:  Do not drive or operate machinery for the first twenty four hours after surgery.  Do not make any important decisions for twenty four hours after surgery or while taking narcotic pain medications or sedatives.  If you develop intractable nausea and vomiting or a severe headache please notify your doctor immediately.  FOLLOW-UP:  Please make an appointment with your surgeon as instructed. You do not need to follow up with anesthesia unless specifically instructed to do so.  WOUND CARE INSTRUCTIONS (if applicable):  Keep a dry clean dressing on the anesthesia/puncture wound site if there is drainage.  Once the wound has quit draining you may leave it open to air.  Generally you should leave the bandage intact for twenty  four hours unless there is drainage.  If the epidural site drains for more than 36-48 hours please call the anesthesia department.  QUESTIONS?:  Please feel free to call your physician or the hospital operator if you have any questions, and they will be happy to assist you.     [facility] Anesthesia Department [address] [place] [phone]

## 2011-07-13 ENCOUNTER — Encounter (HOSPITAL_COMMUNITY)
Admission: RE | Admit: 2011-07-13 | Discharge: 2011-07-13 | Disposition: A | Discharge status: Home / Self Care | Payer: BC Managed Care – PPO | Source: Ambulatory Visit | Attending: Gastroenterology | Admitting: Gastroenterology

## 2011-07-13 ENCOUNTER — Encounter (HOSPITAL_COMMUNITY): Payer: Self-pay

## 2011-07-13 ENCOUNTER — Other Ambulatory Visit: Payer: Self-pay

## 2011-07-13 HISTORY — DX: Paresthesia of skin: R20.2

## 2011-07-13 HISTORY — DX: Anesthesia of skin: R20.0

## 2011-07-13 LAB — CBC
HCT: 31.7 % — ABNORMAL LOW (ref 36.0–46.0)
Hemoglobin: 11 g/dL — ABNORMAL LOW (ref 12.0–15.0)
MCH: 27.8 pg (ref 26.0–34.0)
MCHC: 34.7 g/dL (ref 30.0–36.0)
MCV: 80.3 fL (ref 78.0–100.0)
Platelets: 201 10*3/uL (ref 150–400)
RBC: 3.95 MIL/uL (ref 3.87–5.11)
RDW: 12.9 % (ref 11.5–15.5)
WBC: 3.9 10*3/uL — ABNORMAL LOW (ref 4.0–10.5)

## 2011-07-13 LAB — BASIC METABOLIC PANEL
BUN: 14 mg/dL (ref 6–23)
CO2: 29 mEq/L (ref 19–32)
Calcium: 9.5 mg/dL (ref 8.4–10.5)
Chloride: 102 mEq/L (ref 96–112)
Creatinine, Ser: 0.82 mg/dL (ref 0.50–1.10)
GFR calc Af Amer: >60 mL/min (ref >60)
GFR calc non Af Amer: >60 mL/min (ref >60)
Glucose, Bld: 90 mg/dL (ref 70–99)
Potassium: 4.4 mEq/L (ref 3.5–5.1)
Sodium: 138 mEq/L (ref 135–145)

## 2011-07-13 NOTE — Pre-Procedure Instructions (Addendum)
Pt sts during preop interview that she has recently been tested for myasthenia gravis but that her "test results" were not back yet. Dr Jayme Cloud notified and wanted to contact Dr Darrick Penna and have procedure postponed until test results were completed. Dr Darrick Penna called and said that according to her history, pt was diagnosed in 2009 and is on no meds for this. Sts that she would postpone procedure if Dr Jayme Cloud wanted to wait. Dr Jayme Cloud given new history information from Dr Darrick Penna, he sts he will proceed with procedure with this information. Dr Darrick Penna notified that procedure will continue as scheduled.

## 2011-07-18 ENCOUNTER — Other Ambulatory Visit: Payer: Self-pay | Admitting: Gastroenterology

## 2011-07-18 ENCOUNTER — Ambulatory Visit (HOSPITAL_COMMUNITY): Payer: BC Managed Care – PPO | Admitting: Anesthesiology

## 2011-07-18 ENCOUNTER — Encounter (HOSPITAL_COMMUNITY): Payer: Self-pay | Admitting: Anesthesiology

## 2011-07-18 ENCOUNTER — Ambulatory Visit (HOSPITAL_COMMUNITY)
Admission: RE | Admit: 2011-07-18 | Discharge: 2011-07-18 | Disposition: A | Discharge status: Home / Self Care | Payer: BC Managed Care – PPO | Source: Ambulatory Visit | Attending: Gastroenterology | Admitting: Gastroenterology

## 2011-07-18 ENCOUNTER — Encounter (HOSPITAL_COMMUNITY): Payer: Self-pay | Admitting: *Deleted

## 2011-07-18 ENCOUNTER — Encounter (HOSPITAL_COMMUNITY)
Admission: RE | Disposition: A | Discharge status: Home / Self Care | Payer: Self-pay | Source: Ambulatory Visit | Attending: Gastroenterology

## 2011-07-18 DIAGNOSIS — K2990 Gastroduodenitis, unspecified, without bleeding: Secondary | ICD-10-CM

## 2011-07-18 DIAGNOSIS — Z01812 Encounter for preprocedural laboratory examination: Secondary | ICD-10-CM | POA: Insufficient documentation

## 2011-07-18 DIAGNOSIS — R1310 Dysphagia, unspecified: Secondary | ICD-10-CM

## 2011-07-18 DIAGNOSIS — K921 Melena: Secondary | ICD-10-CM | POA: Insufficient documentation

## 2011-07-18 DIAGNOSIS — K625 Hemorrhage of anus and rectum: Secondary | ICD-10-CM

## 2011-07-18 DIAGNOSIS — Z9283 Personal history of failed moderate sedation: Secondary | ICD-10-CM | POA: Insufficient documentation

## 2011-07-18 DIAGNOSIS — Z01810 Encounter for preprocedural cardiovascular examination: Secondary | ICD-10-CM | POA: Insufficient documentation

## 2011-07-18 DIAGNOSIS — K319 Disease of stomach and duodenum, unspecified: Secondary | ICD-10-CM | POA: Insufficient documentation

## 2011-07-18 DIAGNOSIS — K648 Other hemorrhoids: Secondary | ICD-10-CM | POA: Insufficient documentation

## 2011-07-18 DIAGNOSIS — K2970 Gastritis, unspecified, without bleeding: Secondary | ICD-10-CM

## 2011-07-18 HISTORY — PX: SAVORY DILATION: SHX5439

## 2011-07-18 HISTORY — PX: COLONOSCOPY: SHX174

## 2011-07-18 HISTORY — PX: ESOPHAGOGASTRODUODENOSCOPY: SHX1529

## 2011-07-18 SURGERY — COLONOSCOPY WITH PROPOFOL
Anesthesia: Monitor Anesthesia Care | Site: Rectum

## 2011-07-18 MED ORDER — MINERAL OIL PO OIL
TOPICAL_OIL | ORAL | Status: AC
  Filled 2011-07-18: qty 30

## 2011-07-18 MED ORDER — PROPOFOL 10 MG/ML IV EMUL
INTRAVENOUS | Status: DC | PRN
  Administered 2011-07-18: 35 ug/kg/min via INTRAVENOUS

## 2011-07-18 MED ORDER — ONDANSETRON HCL 4 MG/2ML IJ SOLN
INTRAMUSCULAR | Status: AC
  Administered 2011-07-18: 4 mg via INTRAVENOUS
  Filled 2011-07-18: qty 2

## 2011-07-18 MED ORDER — FENTANYL CITRATE 0.05 MG/ML IJ SOLN
INTRAMUSCULAR | Status: DC | PRN
  Administered 2011-07-18 (×2): 50 ug via INTRAVENOUS

## 2011-07-18 MED ORDER — ONDANSETRON HCL 4 MG/2ML IJ SOLN
4.0000 mg | Freq: Once | INTRAMUSCULAR | Status: AC
  Administered 2011-07-18: 4 mg via INTRAVENOUS

## 2011-07-18 MED ORDER — FENTANYL CITRATE 0.05 MG/ML IJ SOLN
INTRAMUSCULAR | Status: AC
  Filled 2011-07-18: qty 2

## 2011-07-18 MED ORDER — LIDOCAINE HCL (PF) 1 % IJ SOLN
INTRAMUSCULAR | Status: AC
  Filled 2011-07-18: qty 5

## 2011-07-18 MED ORDER — MIDAZOLAM HCL 2 MG/2ML IJ SOLN
1.0000 mg | INTRAMUSCULAR | Status: DC | PRN
  Administered 2011-07-18: 2 mg via INTRAVENOUS

## 2011-07-18 MED ORDER — GLYCOPYRROLATE 0.2 MG/ML IJ SOLN
INTRAMUSCULAR | Status: AC
  Administered 2011-07-18: 0.2 mg via INTRAVENOUS
  Filled 2011-07-18: qty 1

## 2011-07-18 MED ORDER — LACTATED RINGERS IV SOLN
INTRAVENOUS | Status: DC
  Administered 2011-07-18: 50 mL/h via INTRAVENOUS

## 2011-07-18 MED ORDER — BUTAMBEN-TETRACAINE-BENZOCAINE 2-2-14 % EX AERO
1.0000 | INHALATION_SPRAY | Freq: Once | CUTANEOUS | Status: AC
  Administered 2011-07-18: 1 via TOPICAL
  Filled 2011-07-18: qty 56

## 2011-07-18 MED ORDER — FENTANYL CITRATE 0.05 MG/ML IJ SOLN: 25.0000 ug | INTRAMUSCULAR | Status: DC | PRN

## 2011-07-18 MED ORDER — SIMETHICONE LIQD
Status: DC | PRN
  Administered 2011-07-18: 15 mL

## 2011-07-18 MED ORDER — GLYCOPYRROLATE 0.2 MG/ML IJ SOLN
0.2000 mg | Freq: Once | INTRAMUSCULAR | Status: AC
  Administered 2011-07-18: 0.2 mg via INTRAVENOUS

## 2011-07-18 MED ORDER — WATER FOR IRRIGATION, STERILE IR SOLN
Status: DC | PRN
  Administered 2011-07-18: 1000 mL

## 2011-07-18 MED ORDER — PROPOFOL 10 MG/ML IV EMUL
INTRAVENOUS | Status: AC
  Filled 2011-07-18: qty 20

## 2011-07-18 MED ORDER — LACTATED RINGERS IV SOLN: INTRAVENOUS | Status: DC

## 2011-07-18 MED ORDER — STERILE WATER FOR IRRIGATION IR SOLN
Status: DC | PRN
  Administered 2011-07-18: 08:00:00

## 2011-07-18 MED ORDER — ALBUTEROL SULFATE (2.5 MG/3ML) 0.083% IN NEBU
2.5000 mg | INHALATION_SOLUTION | Freq: Once | RESPIRATORY_TRACT | Status: AC
  Administered 2011-07-18: 2.5 mg via RESPIRATORY_TRACT

## 2011-07-18 MED ORDER — MIDAZOLAM HCL 2 MG/2ML IJ SOLN
INTRAMUSCULAR | Status: AC
  Administered 2011-07-18: 2 mg via INTRAVENOUS
  Filled 2011-07-18: qty 2

## 2011-07-18 MED ORDER — ONDANSETRON HCL 4 MG/2ML IJ SOLN: 4.0000 mg | Freq: Once | INTRAMUSCULAR | Status: DC | PRN

## 2011-07-18 MED ORDER — ALBUTEROL SULFATE (5 MG/ML) 0.5% IN NEBU
INHALATION_SOLUTION | RESPIRATORY_TRACT | Status: AC
  Administered 2011-07-18: 2.5 mg via RESPIRATORY_TRACT
  Filled 2011-07-18: qty 0.5

## 2011-07-18 SURGICAL SUPPLY — 26 items
BLOCK BITE 60FR ADLT L/F BLUE (MISCELLANEOUS) ×4 IMPLANT
ELECT REM PT RETURN 9FT ADLT (ELECTROSURGICAL)
ELECTRODE REM PT RTRN 9FT ADLT (ELECTROSURGICAL) IMPLANT
FCP BXJMBJMB 240X2.8X (CUTTING FORCEPS)
FLOOR PAD 36X40 (MISCELLANEOUS) ×4
FORCEP RJ3 GP 1.8X160 W-NEEDLE (CUTTING FORCEPS) IMPLANT
FORCEPS BIOP RAD 4 LRG CAP 4 (CUTTING FORCEPS) ×2 IMPLANT
FORCEPS BIOP RJ4 240 W/NDL (CUTTING FORCEPS)
FORCEPS BXJMBJMB 240X2.8X (CUTTING FORCEPS) IMPLANT
INJECTOR/SNARE I SNARE (MISCELLANEOUS) IMPLANT
LUBRICANT JELLY 4.5OZ STERILE (MISCELLANEOUS) ×2 IMPLANT
MANIFOLD NEPTUNE II (INSTRUMENTS) ×2 IMPLANT
NDL SCLEROTHERAPY 25GX240 (NEEDLE) IMPLANT
NEEDLE SCLEROTHERAPY 25GX240 (NEEDLE) IMPLANT
PAD FLOOR 36X40 (MISCELLANEOUS) ×3 IMPLANT
PROBE APC STR FIRE (PROBE) IMPLANT
PROBE INJECTION GOLD (MISCELLANEOUS)
PROBE INJECTION GOLD 7FR (MISCELLANEOUS) IMPLANT
SNARE ROTATE MED OVAL 20MM (MISCELLANEOUS) IMPLANT
SNARE SHORT THROW 13M SML OVAL (MISCELLANEOUS) IMPLANT
SYR 50ML LL SCALE MARK (SYRINGE) ×2 IMPLANT
TRAP SPECIMEN MUCOUS 40CC (MISCELLANEOUS) IMPLANT
TUBING ENDO SMARTCAP (MISCELLANEOUS) ×4 IMPLANT
TUBING ENDO SMARTCAP PENTAX (MISCELLANEOUS) ×8 IMPLANT
TUBING IRRIGATION ENDOGATOR (MISCELLANEOUS) ×4 IMPLANT
WATER STERILE IRR 1000ML POUR (IV SOLUTION) ×2 IMPLANT

## 2011-07-18 NOTE — Progress Notes (Signed)
During transfer to [facility] and then to Phase II pt complains of "throat closing up"  Vital signs stable, pt anxious, Sao2 100% Dr.Gonzalez and Dr. Darrick Penna aware, albuterol nebulizer tx ordered and given.

## 2011-07-18 NOTE — Anesthesia Postprocedure Evaluation (Signed)
  Anesthesia Post-op Note  Patient: Yesenia Boyd  Procedure(s) Performed:  COLONOSCOPY WITH PROPOFOL - with propofol sedation  In cecum 0818   withdrawal time  .; ESOPHAGOGASTRODUODENOSCOPY (EGD) WITH PROPOFOL - with propofol sedation; procedure started @ 0833; SAVORY DILATION - with propofol sedation ; #16 savory dilation  Patient Location: PACU  Anesthesia Type: MAC  Level of Consciousness: awake and alert   Airway and Oxygen Therapy: Patient Spontanous Breathing  Post-op Pain: none  Post-op Assessment: Post-op Vital signs reviewed, Patient's Cardiovascular Status Stable and Respiratory Function Stable  Post-op Vital Signs: Reviewed and stable  Complications: No apparent anesthesia complications

## 2011-07-18 NOTE — Anesthesia Preprocedure Evaluation (Addendum)
Anesthesia Evaluation  Name, MR# and DOB Patient awake  General Assessment Comment  Reviewed: Allergy & Precautions, H&P , NPO status , Patient's Chart, lab work & pertinent test results  Airway Mallampati: I  Neck ROM: Full    Dental  (+) Teeth Intact, Partial Upper and Partial Lower   Pulmonary   hoarseness    Cardiovascular Regular Normal    Neuro/Psych Peripheral neuropathy  Neuromuscular disease (question of myasthenia gravis, atypical presentation thought not to be  by [facility] clinic. Off meds)   GI/Hepatic/Renal        GERD Medicated     Endo/Other    Abdominal   Musculoskeletal  (+) Fibromyalgia -  Hematology   Peds  Reproductive/Obstetrics    Anesthesia Other Findings             Anesthesia Physical Anesthesia Plan  ASA: III  Anesthesia Plan: MAC   Post-op Pain Management:    Induction: Intravenous  Airway Management Planned: Simple Face Mask  Additional Equipment:   Intra-op Plan:   Post-operative Plan:   Informed Consent: I have reviewed the patients History and Physical, chart, labs and discussed the procedure including the risks, benefits and alternatives for the proposed anesthesia with the patient or authorized representative who has indicated his/her understanding and acceptance.     Plan Discussed with: CRNA  Anesthesia Plan Comments:         Anesthesia Quick Evaluation

## 2011-07-18 NOTE — Interval H&P Note (Signed)
History and Physical Interval Note:   07/18/2011   7:43 AM   Yesenia Boyd  has presented today for surgery, with the diagnosis of INADEQUATE SEDATION, CONSTIPATION DYSPHAGIA  The various methods of treatment have been discussed with the patient and family. After consideration of risks, benefits and other options for treatment, the patient has consented to  Procedure(s): COLONOSCOPY WITH PROPOFOL ESOPHAGOGASTRODUODENOSCOPY (EGD) WITH PROPOFOL SAVORY DILATION MALONEY DILATION as a surgical intervention .  I have reviewed the patients' chart and labs.  Questions were answered to the patient's satisfaction.     Jonette Eva  MD

## 2011-07-18 NOTE — H&P (Signed)
Current Vitals       Recorded User        06/20/2011 10:46 AM  Yesenia Mutton, LPN           BP Pulse Temp (Src) Resp Ht Wt    127/74  60  97.7 F (36.5 C) (Temporal)  N/A  5\' 3"  (1.6 m)  146 lb (66.225 kg)       BMI SpO2 PF LMP    25.86 kg/m2  N/A  N/A  N/A          Progress Notes     Yesenia Halls, NP  06/22/2011  2:43 PM  Signed   Referring Provider: No ref. provider found Primary Care Physician:  Toma Deiters, MD Primary Gastroenterologist: Dr. Darrick Penna     Chief Complaint   Patient presents with   .  Dysphagia   .  Rectal Bleeding       and pressure      HPI:    [name] is a 55 year old female with a history significant for myasthenia gravis, GERD, and IBS. She was last seen by [place] in July of last year. Was on imipramine, [name], Aciphex at that time. She returns today with concerns of epigastric pain and intermittent brbpr. Interestingly, she states she has a consultation with Dr. Gabriel Cirri this Friday for a possible colonoscopy. I told her it would be best to group her GI care, and I asked if she still wanted to be followed by [place]. She would like to stay with [place] and cancel the upcoming appt with him.    She reports recent hospitalization at [facility] due to myasthenia gravis. She states she had hemoccult cards with her at the hospital, and she checked her stool herself. She reports this was heme +. I have no documentation of this to verify. We also do not have the records from [place] at the time of this visit. She is not quite clear about past history, but she does state she has "intermittent blockages", which sounds like significant constipation from her report.    Reports intermittent brbpr. We have not actually performed a colonoscopy at our facility; prior procedure done by Dr. Linna Darner reportedly normal in 2009   Takes 24 mcg [name] each night. Using mag citrate as well. Says gets hot and fast heart. But happens without [name]. Says gets a hot spell, then has a  BM.    Also reports worsening dysphagia for a few months, has to crush pills, has to have gravy or butter, some type of sauce to help with food sliding down. Has to cut up in very small bites. +epigastric soreness, intermittent, for last month. Feels relief after BM. No NSAIDS or aspirin powders.   Has been seen at [facility] in March 2011 for myasthenia gravis. Aug 2010 UGI: small hiatal hernia, loose wrap (s/p Nissen)    Past Medical History   Diagnosis  Date   .  IBS (irritable bowel syndrome)     .  Fibromyalgia     .  Peripheral neuropathy     .  Myasthenia gravis     .  S/P endoscopy  2008, Aug 2010       Dr. Linna Darner 2008: removal of impacted food bolus, Dr. Linna Darner 2010: moderate gastritis   .  S/P colonoscopy  April 2009       normal Dr. Linna Darner       Past Surgical History   Procedure  Date   .  Nissen  fundoplication     .  Cholecystectomy     .  Abdominal hysterectomy     .  Esophageal surgery?         2008 [facility]   .  Hand surgery     .  Neck surgery     .  Bladder surgery         Current Outpatient Prescriptions   Medication  Sig  Dispense  Refill   .  acetaminophen (TYLENOL) 650 MG CR tablet  Take 650 mg by mouth every 8 (eight) hours as needed.           Marland Kitchen  azelastine (OPTIVAR) 0.05 % ophthalmic solution  1 drop 2 (two) times daily.           .  cimetidine (TAGAMET) 200 MG tablet  Take 200 mg by mouth 4 (four) times daily.           .  cycloSPORINE (RESTASIS) 0.05 % ophthalmic emulsion  1 drop 2 (two) times daily.           Marland Kitchen  esomeprazole (NEXIUM) 40 MG capsule  Take 40 mg by mouth daily before breakfast.           .  estrogens, conjugated, (PREMARIN) 0.625 MG tablet  Take 0.625 mg by mouth daily. Take daily for 21 days then do not take for 7 days.          Marland Kitchen  L-Methylfolate-B6-B12 (METANX PO)  Take by mouth daily.           Marland Kitchen  levocetirizine (XYZAL) 5 MG tablet  Take 5 mg by mouth every evening.           .  lubiprostone ([name]) 24 MCG capsule  Take 24 mcg by mouth  2 (two) times daily with a meal.           .  magnesium citrate solution  Take 296 mLs by mouth once.           .  Olopatadine HCl (PATADAY) 0.2 % SOLN  Apply to eye.           .  simethicone (GAS-X) 80 MG chewable tablet  Chew 80 mg by mouth every 6 (six) hours as needed.           .  sucralfate (CARAFATE) 1 GM/10ML suspension  Take 1 g by mouth 4 (four) times daily.           Marland Kitchen  UNABLE TO FIND  Med Name: triamicinolone acetonide cream 0.1% prn            .  UNABLE TO FIND  Med Name: LCD10% sal acid 2 % traim. Use as directed          .  UNKNOWN TO PATIENT  OXYGEN      2L        At bed time instead of C-Pap for sleep apnea.              Allergies as of 06/20/2011 - reviewed 02/11/2009   Allergen  Reaction  Noted   .  Avelox (moxifloxacin hcl in nacl)    06/20/2011   .  Cephalexin       .  Codeine       .  Cymbalta (duloxetine hcl)    06/20/2011   .  Dicyclomine    06/20/2011   .  Duloxetine       .  Meperidine hcl       .  Metronidazole (metrocream)    06/20/2011   .  Morphine       .  Penicillins       .  Prednisone    06/20/2011   .  Sulfonamide derivatives       .  Tetracycline       .  Tramadol hcl           Family History   Problem  Relation  Age of Onset   .  Colon cancer  Cousin         4 cousins       History       Social History   .  Marital Status:  Married       Spouse Name:  N/A       Number of Children:  N/A   .  Years of Education:  N/A       Social History Main Topics   .  Smoking status:  Never Smoker    .  Smokeless tobacco:  Not on file   .  Alcohol Use:  No   .  Drug Use:  Not on file   .  Sexually Active:  Not on file        Review of Systems: Gen: Denies fever, chills, anorexia. Denies fatigue, weakness, weight loss.   CV: Denies chest pain, palpitations, syncope, peripheral edema, and claudication. Resp: Denies dyspnea at rest, cough, wheezing, coughing up blood, and pleurisy. GI: Denies vomiting blood, jaundice, and fecal  incontinence.   Denies odynophagia. + odynophagia.  Derm: Denies rash, itching, dry skin Psych: Denies depression, anxiety, memory loss, confusion. No homicidal or suicidal ideation.   Heme: Denies bruising, bleeding, and enlarged lymph nodes.   Physical Exam: BP 127/74  Pulse 60  Temp(Src) 97.7 F (36.5 C) (Temporal)  Ht 5\' 3"  (1.6 m)  Wt 146 lb (66.225 kg)  BMI 25.86 kg/m2 General:   Alert and oriented. No distress noted. Pleasant and cooperative.   Head:  Normocephalic and atraumatic. Eyes:  Conjuctiva clear without scleral icterus. Mouth:  Oral mucosa pink and moist. Good dentition. No lesions. Neck:  Supple, without mass or thyromegaly. Heart:  S1, S2 present without murmurs, rubs, or gallops. Regular rate and rhythm. Abdomen:  +BS, soft, mildly tender to palpation epigastric region. No rebound or guarding. No HSM or masses noted. Msk:  Symmetrical without gross deformities. Normal posture. Extremities:  Without edema. Neurologic:  Alert and  oriented x4;  grossly normal neurologically. Skin:  Intact without significant lesions or rashes. Cervical Nodes:  No significant cervical adenopathy. Psych:  Alert and cooperative. Normal mood and affect.     Jonette Eva, MD  06/22/2011  2:59 PM  Signed agree  Glendora Score  06/22/2011  3:26 PM  Signed Cc to PCP  Cloria Spring, LPN, LPN  11/25/1476  9:32 AM  Signed Pt informed.    Cloria Spring, LPN, LPN  2/95/6213  9:32 AM  Signed Pt informed.    Cloria Spring, LPN, LPN  0/86/5784  9:34 AM  Signed Quick Note:   Pt informed. ______        Rectal bleeding - Yesenia Halls, NP  06/22/2011  2:40 PM  Signed 55 year old female with reports of intermittent brbpr, last colonoscopy by Dr. Linna Darner in 2009 listed as normal in medical records. States she checked her own stool with hemoccult cards while hospitalized for myasthenia gravis, and they were positive. Do not have confirmation of this, nor do I have  the recent records with labs. She  has hx of constipation, IBS, and worsening epigastric soreness for the past month that is slightly relieved after BM. Remote family hx of colon cancer (cousins). Doubt occult malignancy at this time, likely hematochezia due to benign anorectal source. Although last colonoscopy was in 2009, it was normal, and we have not performed one at our facility. We will proceed with a colonoscopy in the near future, obtain an updated CBC, and request the reports from [facility] in March 2011 for our records.      Proceed with colonoscopy with Dr. Darrick Penna in the near future. The risks, benefits, and alternatives have been discussed in detail with the patient. They state understanding and desire to proceed.      DYSPHAGIA UNSPECIFIED - Yesenia Halls, NP  06/22/2011  2:43 PM  Signed Worsening dysphagia over past few months with need for crushing pills, eating foods that have sauces/gravy to facilitate swallowing. Last EGD with Dr. Linna Darner in 2010, moderate gastritis. Has hx of impacted food bolus in 2008, EGD by Dr. Linna Darner at that time. Question of underlying Schatzki's ring, web, or stricture. Epigastric pain seems to be relieved by defecation; unable to exclude underlying gastritis/PUD. Will proceed with EGD at time of colonoscopy.    Proceed with upper endoscopy/dilation in the near future with Dr. Darrick Penna. The risks, benefits, and alternatives have been discussed in detail with patient. They have stated understanding and desire to proceed.

## 2011-07-18 NOTE — Transfer of Care (Signed)
Immediate Anesthesia Transfer of Care Note  Patient: Yesenia Boyd  Procedure(s) Performed:  COLONOSCOPY WITH PROPOFOL - with propofol sedation  In cecum 0818   withdrawal time  .; ESOPHAGOGASTRODUODENOSCOPY (EGD) WITH PROPOFOL - with propofol sedation; procedure started @ 0833; SAVORY DILATION - with propofol sedation ; #16 savory dilation  Patient Location: PACU  Anesthesia Type: MAC  Level of Consciousness: awake and alert   Airway & Oxygen Therapy: Patient Spontanous Breathing and Patient connected to face mask oxygen  Post-op Assessment: Report given to PACU RN, Post -op Vital signs reviewed and stable and Patient moving all extremities  Post vital signs: Reviewed and stable  Complications: No apparent anesthesia complications

## 2011-07-22 ENCOUNTER — Encounter (HOSPITAL_COMMUNITY): Payer: Self-pay | Admitting: Gastroenterology

## 2011-07-27 ENCOUNTER — Telehealth: Payer: Self-pay | Admitting: Gastroenterology

## 2011-07-27 NOTE — Telephone Encounter (Signed)
Pt is aware of OV for 1/10 at 0830 with AS

## 2011-07-27 NOTE — Telephone Encounter (Signed)
Results Cc to PCP  

## 2011-07-27 NOTE — Telephone Encounter (Signed)
Please call pt. His stomach Bx shows mild gastritis. Continue Nexium, [name], and Tagamet. No need for additional GI workup. You may use Anusol HC suppositories intermittently for rectal bleeding from hemorrhoids. FOLLOW UP IN 4 MOS.

## 2011-07-28 NOTE — Telephone Encounter (Signed)
Pt informed

## 2011-11-17 ENCOUNTER — Ambulatory Visit: Payer: BC Managed Care – PPO | Admitting: Gastroenterology

## 2011-11-21 DIAGNOSIS — K921 Melena: Secondary | ICD-10-CM | POA: Diagnosis not present

## 2011-11-21 DIAGNOSIS — R1084 Generalized abdominal pain: Secondary | ICD-10-CM | POA: Diagnosis not present

## 2011-11-21 DIAGNOSIS — K2990 Gastroduodenitis, unspecified, without bleeding: Secondary | ICD-10-CM | POA: Diagnosis not present

## 2011-11-21 DIAGNOSIS — L503 Dermatographic urticaria: Secondary | ICD-10-CM | POA: Diagnosis not present

## 2011-11-21 DIAGNOSIS — J45901 Unspecified asthma with (acute) exacerbation: Secondary | ICD-10-CM | POA: Diagnosis not present

## 2011-11-21 DIAGNOSIS — K219 Gastro-esophageal reflux disease without esophagitis: Secondary | ICD-10-CM | POA: Diagnosis not present

## 2011-11-21 DIAGNOSIS — K2970 Gastritis, unspecified, without bleeding: Secondary | ICD-10-CM | POA: Diagnosis not present

## 2011-11-21 DIAGNOSIS — IMO0001 Reserved for inherently not codable concepts without codable children: Secondary | ICD-10-CM | POA: Diagnosis not present

## 2011-12-22 DIAGNOSIS — L819 Disorder of pigmentation, unspecified: Secondary | ICD-10-CM | POA: Diagnosis not present

## 2011-12-22 DIAGNOSIS — L708 Other acne: Secondary | ICD-10-CM | POA: Diagnosis not present

## 2011-12-22 DIAGNOSIS — L738 Other specified follicular disorders: Secondary | ICD-10-CM | POA: Diagnosis not present

## 2011-12-26 ENCOUNTER — Ambulatory Visit (INDEPENDENT_AMBULATORY_CARE_PROVIDER_SITE_OTHER): Payer: BC Managed Care – PPO | Admitting: Gastroenterology

## 2011-12-26 ENCOUNTER — Encounter: Payer: Self-pay | Admitting: Gastroenterology

## 2011-12-26 VITALS — BP 107/67 | HR 72 | Temp 97.6°F | Ht 63.0 in | Wt 147.0 lb

## 2011-12-26 DIAGNOSIS — K219 Gastro-esophageal reflux disease without esophagitis: Secondary | ICD-10-CM | POA: Diagnosis not present

## 2011-12-26 DIAGNOSIS — R109 Unspecified abdominal pain: Secondary | ICD-10-CM | POA: Diagnosis not present

## 2011-12-26 DIAGNOSIS — K589 Irritable bowel syndrome without diarrhea: Secondary | ICD-10-CM

## 2011-12-26 MED ORDER — LINACLOTIDE 290 MCG PO CAPS: 1.0000 | ORAL_CAPSULE | Freq: Every day | ORAL | Status: DC

## 2011-12-26 NOTE — Progress Notes (Signed)
Referring Provider: Toma Deiters, MD Primary Care Physician:  Toma Deiters, MD, MD Primary Gastroenterologist: Dr. Darrick Penna   Chief Complaint  Patient presents with  . Follow-up    HPI:   Yesenia Boyd presents today in follow-up for IBS-C and hx of dysphagia. Recent EGD/TCS as of Sept 2012 without significant findings, outlined below. Next TCS in 2022. Presents today with continued reflux, abdominal soreness/epigastric/RUQ, a lot of growling in stomach (gas). BMs there is a "pulling sensation". States having to take more mag citrate even with amitiza just daily (24 mcg). States stool is thick. Reports hx of myasthenia gravis, states can't take many medications due to "side effects". States [name] BID was too much to handle, made her dizzy, heart palpitations. She has tried multiple PPIs in the past, see below:  Prilosec, Protonix, Dexilant, Nexium, Aciphex on awhile. Aciphex did pretty well for awhile.   Past Medical History  Diagnosis Date  . IBS (irritable bowel syndrome)   . S/P endoscopy 2008, Aug 2010, Sept 2012    Dr. Linna Darner 2008: removal of impacted food bolus, Dr. Linna Darner 2010: moderate gastritis, Sept 2012 with [facility]: path with mild gastritis, no definite stricture noted, dilation with Svary 16 mm  . S/P colonoscopy April 2009, Sept 2012    normal Dr. Linna Darner, internal hemorrhoids, repeat in Sept 2022  . Fibromyalgia     sees Dr Philis Kendall at [facility]  . Peripheral neuropathy   . Myasthenia gravis   . Numbness and tingling in right hand     and whole left side of body-pt sts she has "spells to where she cannot walk or talk"    Past Surgical History  Procedure Date  . Nissen fundoplication   . Cholecystectomy   . Abdominal hysterectomy   . Esophageal surgery?     2008 [facility]  . Hand surgery     carpal tumnnel right and removal of cyst left  . Neck surgery     removal of "knot"t from neck  . Bladder surgery     stretch bladder opening  . Savory dilation 07/18/2011    Procedure: SAVORY  DILATION;  Surgeon: Arlyce Harman, MD;  Location: [facility];  Service: Endoscopy;  Laterality: N/A;  with propofol sedation ; #16 savory dilation    Current Outpatient Prescriptions  Medication Sig Dispense Refill  . acetaminophen (TYLENOL) 650 MG CR tablet Take 650 mg by mouth every 8 (eight) hours as needed.        Marland Kitchen adapalene (DIFFERIN) 0.1 % gel Apply topically at bedtime.       Marland Kitchen azelastine (ASTELIN) 137 MCG/SPRAY nasal spray Place 1 spray into the nose 2 (two) times daily. Use in each nostril as directed       . azelastine (OPTIVAR) 0.05 % ophthalmic solution 1 drop 2 (two) times daily.       . Cholecalciferol ([employer] VIT D 5000 HIGH-POTENCY) 5000 UNITS capsule Take 5,000 Units by mouth daily.        . cycloSPORINE (RESTASIS) 0.05 % ophthalmic emulsion Place 1 drop into both eyes 2 (two) times daily.       Marland Kitchen desonide (DESOWEN) 0.05 % cream Apply 1 application topically 2 (two) times daily.       . ergocalciferol (VITAMIN D2) 50000 UNITS capsule Take 50,000 Units by mouth once a week.        . esomeprazole (NEXIUM) 40 MG capsule Take 40 mg by mouth 2 (two) times daily.       Marland Kitchen estrogens,  conjugated, (PREMARIN) 0.625 MG tablet Take 0.625 mg by mouth daily. Take daily for 21 days then do not take for 7 days.       Marland Kitchen L-Methylfolate-B6-B12 (METANX PO) Take by mouth daily.        Marland Kitchen levocetirizine (XYZAL) 5 MG tablet Take 5 mg by mouth every evening.        . lubiprostone ([name]) 24 MCG capsule Take 24 mcg by mouth 2 (two) times daily with a meal.        . magnesium citrate solution Take 296 mLs by mouth daily as needed. For blockage      . Olopatadine HCl (PATADAY) 0.2 % SOLN Apply to eye.        . simethicone (GAS-X) 80 MG chewable tablet Chew 80 mg by mouth every 6 (six) hours as needed. For gas      . Soap & Cleansers (AQUA GLYCOLIC TONER EX) Apply topically.      . triamcinolone (KENALOG) 0.1 % cream Apply 1 application topically 2 (two) times daily as needed. For skin       . UNABLE TO FIND  Med Name: triamicinolone acetonide cream 0.1% prn        . UNABLE TO FIND Apply 1 application topically daily. Med Name: LCD10% sal acid 2 % traim. Use as directed      . UNKNOWN TO PATIENT OXYGEN      2L        At bed time instead of C-Pap for sleep apnea.         Allergies as of 12/26/2011 - Review Complete 12/26/2011  Allergen Reaction Noted  . Avelox (moxifloxacin hcl in nacl)  06/20/2011  . Cephalexin    . Codeine    . Cymbalta (duloxetine hcl)  06/20/2011  . Dicyclomine  06/20/2011  . Duloxetine    . Macrolides and ketolides Swelling 07/18/2011  . Meperidine hcl    . Metronidazole (metrocream)  06/20/2011  . Morphine    . Penicillins    . Prednisone  06/20/2011  . Sulfonamide derivatives    . Tetracycline    . Tramadol hcl      Family History  Problem Relation Age of Onset  . Colon cancer Cousin     4 cousins  . Anesthesia problems Sister   . Pseudochol deficiency Neg Hx   . Hypotension Neg Hx   . Malignant hyperthermia Neg Hx     History   Social History  . Marital Status: Married    Spouse Name: N/A    Number of Children: N/A  . Years of Education: N/A   Social History Main Topics  . Smoking status: Never Smoker   . Smokeless tobacco: None  . Alcohol Use: No     drinks wine "every blue moon"  . Drug Use: No  . Sexually Active: Yes    Birth Control/ Protection: Surgical   Other Topics Concern  . None   Social History Narrative  . None    Review of Systems: Gen: Denies fever, chills, anorexia. Denies fatigue, weakness, weight loss.  CV: Denies chest pain, palpitations, syncope, peripheral edema, and claudication. Resp: Denies dyspnea at rest, cough, wheezing, coughing up blood, and pleurisy. GI: Denies vomiting blood, jaundice, and fecal incontinence.   Denies dysphagia or odynophagia. Derm: Denies rash, itching, dry skin Psych: Denies depression, anxiety, memory loss, confusion. No homicidal or suicidal ideation.  Heme: Denies bruising,  bleeding, and enlarged lymph nodes.  Physical Exam: BP 107/67  Pulse  72  Temp(Src) 97.6 F (36.4 C) (Temporal)  Ht 5\' 3"  (1.6 m)  Wt 147 lb (66.679 kg)  BMI 26.04 kg/m2 General:   Alert and oriented. No distress noted. Pleasant and cooperative.  Head:  Normocephalic and atraumatic. Eyes:  Conjuctiva clear without scleral icterus. Neck:  Supple, without mass or thyromegaly. Heart:  S1, S2 present without murmurs, rubs, or gallops. Regular rate and rhythm. Abdomen:  +BS, soft, non-tender and non-distended. No rebound or guarding. No HSM or masses noted. Msk:  Symmetrical without gross deformities. Normal posture. Extremities:  Without edema. Neurologic:  Alert and  oriented x4;  grossly normal neurologically. Skin:  Intact without significant lesions or rashes. Cervical Nodes:  No significant cervical adenopathy. Psych:  Alert and cooperative. Normal mood and affect.

## 2011-12-26 NOTE — Patient Instructions (Signed)
Please have blood work completed. We will call you with the results.  Stop [name]. Start taking Linzess 290 mcg daily. Take this 30 minutes before the first meal of the day.   Continue to take Nexium as you are doing.  You may also start taking a probiotic daily. Some examples over-the-counter are [employer]age, [employer], Restora, Librarian, academic. We have provided samples for you.  Dr. Darrick Penna will be seeing you in 2 months. If you notice improvement with Linzess for constipation, please let us know so we may send this to your pharmacy.

## 2011-12-27 DIAGNOSIS — R109 Unspecified abdominal pain: Secondary | ICD-10-CM | POA: Insufficient documentation

## 2011-12-27 LAB — HEPATIC FUNCTION PANEL
ALT: 15 U/L (ref 0–35)
AST: 19 U/L (ref 0–37)
Albumin: 4 g/dL (ref 3.5–5.2)
Alkaline Phosphatase: 42 U/L (ref 39–117)
Bilirubin, Direct: 0.1 mg/dL (ref 0.0–0.3)
Indirect Bilirubin: 0.2 mg/dL (ref 0.0–0.9)
Total Bilirubin: 0.3 mg/dL (ref 0.3–1.2)
Total Protein: 6.6 g/dL (ref 6.0–8.3)

## 2011-12-27 NOTE — Assessment & Plan Note (Signed)
[name] 24 mcg daily, does not want to do BID. Taking mag citrate routinely. Will trial Linzess 290 mcg. Samples provided. Pt to call if improvement and rx will be sent. F/U in 2 mos with [facility]

## 2011-12-27 NOTE — Assessment & Plan Note (Signed)
Appears chronic, continued epigastric/RUQ pain, EGD up-to-date. Question chronic abdominal pain. Has been on imipramine in the past. May benefit from pain management, restarting imipramine, or referral to tertiary facility for further management. No recent CT scans, may be beneficial. Will proceed with HFP. To discuss with [facility].  Return to see Dr. Darrick Penna in 2 mos

## 2011-12-27 NOTE — Assessment & Plan Note (Signed)
Chronic, on Nexium currently, has tried virtually all PPIs. EGD up-to-date, has had multiple endoscopic evaluations in past. F/U with [facility] in 2 mos. Offered to restart Aciphex, pt denied at this time. Doubt further evaluation (pH/impedance, etc) would be of much benefit, will defer to [facility] for further recommendations.

## 2011-12-28 NOTE — Progress Notes (Signed)
Faxed to PCP

## 2012-01-02 ENCOUNTER — Ambulatory Visit (HOSPITAL_COMMUNITY): Payer: BC Managed Care – PPO | Admitting: Psychology

## 2012-01-10 ENCOUNTER — Telehealth: Payer: Self-pay | Admitting: Gastroenterology

## 2012-01-10 NOTE — Telephone Encounter (Signed)
Patient is asking if there is a lower dose Lenzess she could take shes wanting Rx called to [facility] in [place] please advise if dose has changed

## 2012-01-10 NOTE — Progress Notes (Signed)
Quick Note:  Normal.  Pt is scheduled to see Dr. Darrick Penna again in future. I don't see where she has had an imaging recently.  Is she still having abdominal pain? (most likely, yes, because she has chronic abdominal pain). Let's find out how she is doing. ______

## 2012-01-10 NOTE — Telephone Encounter (Signed)
If the 290 mcg is working well, why doesn't she stick with that? Sorry for confusion.

## 2012-01-10 NOTE — Telephone Encounter (Signed)
The Linzess is working good. She will come by and try the sample first before we call in the Rx. I will leave them up front for her.

## 2012-01-10 NOTE — Progress Notes (Signed)
Quick Note:  Called, LMOM for a return call. ______

## 2012-01-10 NOTE — Telephone Encounter (Signed)
Great!

## 2012-01-10 NOTE — Telephone Encounter (Signed)
Will try to call back later

## 2012-01-10 NOTE — Telephone Encounter (Signed)
Yes, she could try Linzess 145 mcg.  We can offer her samples.  Have her call if this works, and we will send in prescription.

## 2012-01-10 NOTE — Telephone Encounter (Signed)
She will stay with the 290 for now. I will call back if she has anymore questions

## 2012-01-11 NOTE — Progress Notes (Signed)
Quick Note:  Noted. On Linzess. May need adjustment of dose if further diarrhea. ______

## 2012-01-11 NOTE — Progress Notes (Signed)
PT NEEDS E 30 FOLLOW UP VISIT.  REVIEWED.

## 2012-01-11 NOTE — Progress Notes (Signed)
Quick Note:  Pt informed and will let us know if she has any more problems with diarrhea. ______

## 2012-01-11 NOTE — Progress Notes (Signed)
Quick Note:  Pt returned call and was informed. Said the abdominal pain is much improved. Does not have pain on a daily basis, just occasionally. She said her hemorrhoids are better. She did have some diarrhea last night, but is OK this AM. She will call if she has any more problems. ______

## 2012-01-11 NOTE — Progress Notes (Signed)
Quick Note:  LMOM (cell ) for a return call. ______

## 2012-01-16 ENCOUNTER — Telehealth: Payer: Self-pay

## 2012-01-16 NOTE — Telephone Encounter (Signed)
Stop Linzess.  Once diarrhea resolves, may resume taking previous doze of [name], just once per day for now.  Keep upcoming appt.

## 2012-01-16 NOTE — Telephone Encounter (Signed)
Called and informed pt.  

## 2012-01-16 NOTE — Telephone Encounter (Signed)
Pt called and said she is having some complications. She had a bad week-end, with abdominal pain/soreness. She had some diarrhea and some dizziness. Her heart was racing at times. She wants to know if this could be side effect of the Linzess or the probitotic. Please advise!

## 2012-01-17 ENCOUNTER — Ambulatory Visit (HOSPITAL_COMMUNITY): Payer: BC Managed Care – PPO | Admitting: Psychology

## 2012-01-19 NOTE — Progress Notes (Signed)
Reminder nicd in the computer

## 2012-01-20 ENCOUNTER — Ambulatory Visit (HOSPITAL_COMMUNITY): Payer: BC Managed Care – PPO | Admitting: Psychiatry

## 2012-01-23 DIAGNOSIS — K219 Gastro-esophageal reflux disease without esophagitis: Secondary | ICD-10-CM | POA: Diagnosis not present

## 2012-01-23 DIAGNOSIS — K921 Melena: Secondary | ICD-10-CM | POA: Diagnosis not present

## 2012-01-23 DIAGNOSIS — R1084 Generalized abdominal pain: Secondary | ICD-10-CM | POA: Diagnosis not present

## 2012-01-23 DIAGNOSIS — K2990 Gastroduodenitis, unspecified, without bleeding: Secondary | ICD-10-CM | POA: Diagnosis not present

## 2012-01-23 DIAGNOSIS — J45901 Unspecified asthma with (acute) exacerbation: Secondary | ICD-10-CM | POA: Diagnosis not present

## 2012-01-23 DIAGNOSIS — IMO0001 Reserved for inherently not codable concepts without codable children: Secondary | ICD-10-CM | POA: Diagnosis not present

## 2012-01-23 DIAGNOSIS — K2970 Gastritis, unspecified, without bleeding: Secondary | ICD-10-CM | POA: Diagnosis not present

## 2012-01-23 DIAGNOSIS — L503 Dermatographic urticaria: Secondary | ICD-10-CM | POA: Diagnosis not present

## 2012-01-31 ENCOUNTER — Ambulatory Visit (HOSPITAL_COMMUNITY): Payer: BC Managed Care – PPO | Admitting: Psychology

## 2012-02-16 ENCOUNTER — Ambulatory Visit (HOSPITAL_COMMUNITY): Payer: Self-pay | Admitting: Psychology

## 2012-02-20 DIAGNOSIS — R252 Cramp and spasm: Secondary | ICD-10-CM | POA: Diagnosis not present

## 2012-02-21 ENCOUNTER — Ambulatory Visit (HOSPITAL_COMMUNITY): Payer: BC Managed Care – PPO | Admitting: Psychology

## 2012-03-13 ENCOUNTER — Ambulatory Visit (HOSPITAL_COMMUNITY): Payer: BC Managed Care – PPO | Admitting: Psychology

## 2012-03-23 DIAGNOSIS — K219 Gastro-esophageal reflux disease without esophagitis: Secondary | ICD-10-CM | POA: Diagnosis not present

## 2012-04-03 ENCOUNTER — Ambulatory Visit (HOSPITAL_COMMUNITY): Payer: BC Managed Care – PPO | Admitting: Psychology

## 2012-04-23 DIAGNOSIS — M545 Low back pain, unspecified: Secondary | ICD-10-CM | POA: Diagnosis not present

## 2012-04-24 ENCOUNTER — Ambulatory Visit (HOSPITAL_COMMUNITY): Payer: BC Managed Care – PPO | Admitting: Psychology

## 2012-05-25 ENCOUNTER — Ambulatory Visit (HOSPITAL_COMMUNITY): Payer: BC Managed Care – PPO | Admitting: Psychology

## 2012-06-25 ENCOUNTER — Ambulatory Visit (HOSPITAL_COMMUNITY): Payer: BC Managed Care – PPO | Admitting: Psychology

## 2012-06-26 DIAGNOSIS — H2510 Age-related nuclear cataract, unspecified eye: Secondary | ICD-10-CM | POA: Diagnosis not present

## 2012-06-26 DIAGNOSIS — H5200 Hypermetropia, unspecified eye: Secondary | ICD-10-CM | POA: Diagnosis not present

## 2012-06-26 DIAGNOSIS — H524 Presbyopia: Secondary | ICD-10-CM | POA: Diagnosis not present

## 2012-06-26 DIAGNOSIS — H52229 Regular astigmatism, unspecified eye: Secondary | ICD-10-CM | POA: Diagnosis not present

## 2012-06-28 DIAGNOSIS — L708 Other acne: Secondary | ICD-10-CM | POA: Diagnosis not present

## 2012-07-26 ENCOUNTER — Ambulatory Visit (HOSPITAL_COMMUNITY): Payer: BC Managed Care – PPO | Admitting: Psychology

## 2012-07-30 DIAGNOSIS — K2990 Gastroduodenitis, unspecified, without bleeding: Secondary | ICD-10-CM | POA: Diagnosis not present

## 2012-07-30 DIAGNOSIS — K2970 Gastritis, unspecified, without bleeding: Secondary | ICD-10-CM | POA: Diagnosis not present

## 2012-08-03 DIAGNOSIS — Z23 Encounter for immunization: Secondary | ICD-10-CM | POA: Diagnosis not present

## 2012-08-06 DIAGNOSIS — Z8249 Family history of ischemic heart disease and other diseases of the circulatory system: Secondary | ICD-10-CM | POA: Diagnosis not present

## 2012-08-06 DIAGNOSIS — Z886 Allergy status to analgesic agent status: Secondary | ICD-10-CM | POA: Diagnosis not present

## 2012-08-06 DIAGNOSIS — Z880 Allergy status to penicillin: Secondary | ICD-10-CM | POA: Diagnosis not present

## 2012-08-06 DIAGNOSIS — Z882 Allergy status to sulfonamides status: Secondary | ICD-10-CM | POA: Diagnosis not present

## 2012-08-06 DIAGNOSIS — Z883 Allergy status to other anti-infective agents status: Secondary | ICD-10-CM | POA: Diagnosis not present

## 2012-08-06 DIAGNOSIS — Z888 Allergy status to other drugs, medicaments and biological substances status: Secondary | ICD-10-CM | POA: Diagnosis not present

## 2012-08-06 DIAGNOSIS — Z881 Allergy status to other antibiotic agents status: Secondary | ICD-10-CM | POA: Diagnosis not present

## 2012-08-06 DIAGNOSIS — Z885 Allergy status to narcotic agent status: Secondary | ICD-10-CM | POA: Diagnosis not present

## 2012-08-06 DIAGNOSIS — Z79899 Other long term (current) drug therapy: Secondary | ICD-10-CM | POA: Diagnosis not present

## 2012-08-06 DIAGNOSIS — Z803 Family history of malignant neoplasm of breast: Secondary | ICD-10-CM | POA: Diagnosis not present

## 2012-08-06 DIAGNOSIS — IMO0001 Reserved for inherently not codable concepts without codable children: Secondary | ICD-10-CM | POA: Diagnosis not present

## 2012-08-06 DIAGNOSIS — I2510 Atherosclerotic heart disease of native coronary artery without angina pectoris: Secondary | ICD-10-CM | POA: Diagnosis not present

## 2012-08-06 DIAGNOSIS — K5900 Constipation, unspecified: Secondary | ICD-10-CM | POA: Diagnosis not present

## 2012-08-06 DIAGNOSIS — Z800 Family history of malignant neoplasm of digestive organs: Secondary | ICD-10-CM | POA: Diagnosis not present

## 2012-08-06 DIAGNOSIS — K449 Diaphragmatic hernia without obstruction or gangrene: Secondary | ICD-10-CM | POA: Diagnosis not present

## 2012-08-06 DIAGNOSIS — R079 Chest pain, unspecified: Secondary | ICD-10-CM | POA: Diagnosis not present

## 2012-08-07 DIAGNOSIS — R079 Chest pain, unspecified: Secondary | ICD-10-CM | POA: Diagnosis not present

## 2012-08-07 DIAGNOSIS — K449 Diaphragmatic hernia without obstruction or gangrene: Secondary | ICD-10-CM | POA: Diagnosis not present

## 2012-08-07 DIAGNOSIS — IMO0001 Reserved for inherently not codable concepts without codable children: Secondary | ICD-10-CM | POA: Diagnosis not present

## 2012-08-07 DIAGNOSIS — I2510 Atherosclerotic heart disease of native coronary artery without angina pectoris: Secondary | ICD-10-CM | POA: Diagnosis not present

## 2012-08-07 DIAGNOSIS — K5900 Constipation, unspecified: Secondary | ICD-10-CM | POA: Diagnosis not present

## 2012-08-21 DIAGNOSIS — K2960 Other gastritis without bleeding: Secondary | ICD-10-CM | POA: Diagnosis not present

## 2012-09-05 ENCOUNTER — Ambulatory Visit (HOSPITAL_COMMUNITY): Payer: BC Managed Care – PPO | Admitting: Psychology

## 2012-09-21 DIAGNOSIS — K589 Irritable bowel syndrome without diarrhea: Secondary | ICD-10-CM | POA: Diagnosis not present

## 2012-10-03 ENCOUNTER — Ambulatory Visit (HOSPITAL_COMMUNITY): Payer: BC Managed Care – PPO | Admitting: Psychology

## 2012-10-22 DIAGNOSIS — K2960 Other gastritis without bleeding: Secondary | ICD-10-CM | POA: Diagnosis not present

## 2012-10-24 ENCOUNTER — Ambulatory Visit (HOSPITAL_COMMUNITY): Payer: BC Managed Care – PPO | Admitting: Psychology

## 2012-11-16 ENCOUNTER — Ambulatory Visit (INDEPENDENT_AMBULATORY_CARE_PROVIDER_SITE_OTHER): Payer: BC Managed Care – PPO | Admitting: Psychology

## 2012-11-16 DIAGNOSIS — F411 Generalized anxiety disorder: Secondary | ICD-10-CM | POA: Diagnosis not present

## 2012-11-16 DIAGNOSIS — F339 Major depressive disorder, recurrent, unspecified: Secondary | ICD-10-CM

## 2012-11-23 DIAGNOSIS — R5381 Other malaise: Secondary | ICD-10-CM | POA: Diagnosis not present

## 2012-12-06 DIAGNOSIS — K219 Gastro-esophageal reflux disease without esophagitis: Secondary | ICD-10-CM | POA: Diagnosis not present

## 2012-12-07 ENCOUNTER — Ambulatory Visit (INDEPENDENT_AMBULATORY_CARE_PROVIDER_SITE_OTHER): Payer: BC Managed Care – PPO | Admitting: Psychology

## 2012-12-07 DIAGNOSIS — F339 Major depressive disorder, recurrent, unspecified: Secondary | ICD-10-CM | POA: Diagnosis not present

## 2012-12-07 DIAGNOSIS — F411 Generalized anxiety disorder: Secondary | ICD-10-CM | POA: Diagnosis not present

## 2012-12-07 DIAGNOSIS — F419 Anxiety disorder, unspecified: Secondary | ICD-10-CM

## 2012-12-11 ENCOUNTER — Encounter (HOSPITAL_COMMUNITY): Payer: Self-pay | Admitting: Psychology

## 2012-12-11 NOTE — Progress Notes (Signed)
Patient:  Yesenia Boyd   DOB: [birth year]  MR Number: [id number]  Location: [facility]AVIOR[facility] [address] [place] [address] Dept: [phone]  Start:  2 PM End: 3 PM  Provider/Observer:     Hershal Coria PSYD  Chief Complaint:      Chief Complaint  Patient presents with  . Agitation  . Stress  . Depression    Reason For Service:     The patient initially presented with issues of depression, anxiety, and adjustment difficulties. She has had a lot of traumatic and stressful events in her life that she has difficulty coping with. The patient has numerous medical issues that can be found in her medical chart. Many these have some components to difficulty with sympathetic arousal and anxiety issues.  Interventions Strategy:  Cognitive/behavioral psychotherapeutic interventions  Participation Level:   Active  Participation Quality:  Appropriate      Behavioral Observation:  Well Groomed, Alert, and Appropriate.   Current Psychosocial Factors: The patient continues to struggle a great deal with issues have to do with her son and his complex medical issues as well as her daughter. Today issues primarily has to do with stress around her daughters relationship and the fact that her daughter's girlfriend is strongly shown her strikes to the point that the daughter is realizing that the patient has been right about a number of issues regarding this relationship.  Content of Session:   Reviewed current symptoms and continued work on therapeutic interventions for depression and anxiety.  Current Status:   The patient reports that her symptoms of depression anxiety have continued to improve but she continues to experience a lot of agitation and stress.  Patient Progress:   Very good  Target Goals:   Target goals include reducing the intensity, duration, and frequency of significant episodes of  depression and anxiety.  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  Impression/Diagnosis:   The patient is a long-standing history of recurrent depression and anxiety. Fibromyalgia and irritable bowel syndrome are also present. 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  Diagnosis:    Axis I:  1. Major depression, recurrent   2. Anxiety         Axis II: No diagnosis

## 2012-12-24 ENCOUNTER — Encounter (HOSPITAL_COMMUNITY): Payer: Self-pay | Admitting: Psychology

## 2012-12-24 DIAGNOSIS — L255 Unspecified contact dermatitis due to plants, except food: Secondary | ICD-10-CM | POA: Diagnosis not present

## 2012-12-24 NOTE — Progress Notes (Signed)
Patient:  Yesenia Boyd   DOB: 1956/08/02  MR Number: [id number]  Location: [facility] [address] [place] [address] Dept: [phone]  Start: 2 PM  End: 3 PM  Provider/Observer: Hershal Coria PSYD  Chief Complaint:  Chief Complaint   Patient presents with   .  Agitation   .  Stress   .  Depression   Reason For Service: The patient initially presented with issues of depression, anxiety, and adjustment difficulties. She has had a lot of traumatic and stressful events in her life that she has difficulty coping with. The patient has numerous medical issues that can be found in her medical chart. Many these have some components to difficulty with sympathetic arousal and anxiety issues.  Interventions Strategy: Cognitive/behavioral psychotherapeutic interventions  Participation Level: Active  Participation Quality: Appropriate  Behavioral Observation: Well Groomed, Alert, and Appropriate.  Current Psychosocial Factors: The patient continues to struggle a great deal with issues have to do with her son and his complex medical issues as well as her daughter. Today issues primarily has to do with stress around her daughters relationship and the fact that her daughter's girlfriend is strongly shown her strikes to the point that the daughter is realizing that the patient has been right about a number of issues regarding this relationship.  Content of Session: Reviewed current symptoms and continued work on therapeutic interventions for depression and anxiety.  Current Status: The patient reports that her symptoms of depression anxiety have continued to improve but she continues to experience a lot of agitation and stress.  Patient Progress: Very good  Target Goals: Target goals include reducing the intensity, duration, and frequency of significant episodes of depression and anxiety.  Last Reviewed:  12/07/2012  Goals Addressed Today: Today we worked on Producer, television/film/video and strategies are in issues of her depression and anxiety.  Impression/Diagnosis: The patient is a long-standing history of recurrent depression and anxiety. Fibromyalgia and irritable bowel syndrome are also present. 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  Diagnosis:   Axis I: Major depression, recurrent  Anxiety state, unspecified      Axis II: No diagnosis

## 2012-12-25 DIAGNOSIS — R1013 Epigastric pain: Secondary | ICD-10-CM | POA: Diagnosis not present

## 2012-12-25 DIAGNOSIS — K219 Gastro-esophageal reflux disease without esophagitis: Secondary | ICD-10-CM | POA: Diagnosis not present

## 2012-12-26 DIAGNOSIS — R1310 Dysphagia, unspecified: Secondary | ICD-10-CM | POA: Diagnosis not present

## 2012-12-27 ENCOUNTER — Encounter: Payer: Self-pay | Admitting: Gastroenterology

## 2012-12-27 ENCOUNTER — Ambulatory Visit (INDEPENDENT_AMBULATORY_CARE_PROVIDER_SITE_OTHER): Payer: BC Managed Care – PPO | Admitting: Gastroenterology

## 2012-12-27 ENCOUNTER — Telehealth (INDEPENDENT_AMBULATORY_CARE_PROVIDER_SITE_OTHER): Payer: Self-pay

## 2012-12-27 VITALS — BP 109/66 | HR 71 | Temp 98.3°F | Ht 63.0 in | Wt 147.2 lb

## 2012-12-27 DIAGNOSIS — K219 Gastro-esophageal reflux disease without esophagitis: Secondary | ICD-10-CM | POA: Diagnosis not present

## 2012-12-27 DIAGNOSIS — R1310 Dysphagia, unspecified: Secondary | ICD-10-CM | POA: Diagnosis not present

## 2012-12-27 DIAGNOSIS — K589 Irritable bowel syndrome without diarrhea: Secondary | ICD-10-CM

## 2012-12-27 NOTE — Assessment & Plan Note (Addendum)
SX NOT IDEALLY CONTROLLED.   1. PUT THE HEAD OF YOUR BED ON 6 INCH BLOCKS. 2. CONTINUE NEXIUM. TAKE 30 MINUTES BEFORE MEALS TWICE DAILY. 3. FOLLOW FULL LIQUID DIET FOR 3 DAYS WITH FLARES OF PAIN, NAUSEA, OR VOMITING THEN FOLLOW A LOW FAT/SOFT MECHANICAL DIET.   HO GIVEN. 4. EAT 4 TO 6 SMALL MEALS DAILY. 5. STAY UPRIGHT 30 MINUTES AFTER EATING. WAIT 3 TO 4 HOURS BEFORE LAYING DOWN AFTER EATING. 6. FOLLOW UP WITH [facility] FOR EVALUATION FOR RE-DO NISSEN. 7. FOLLOW UP IN 3 MOS

## 2012-12-27 NOTE — Assessment & Plan Note (Signed)
MOST LIKELY DUE TO MANY FACTORS: PRIOR NISSEN FUNDOPLICATION, ESOPHAGEAL MOTILITY DISORDER, AND REFLUX.   FOLLOW FULL LIQUID DIET WITH FLARES OF PAIN, NAUSEA, OR VOMITIING. HO GIVEN. FOLLOW A LOW FAT/SOFT MECHANICAL DIET.  HO GIVEN. EAT 4 TO 6 SMALL MEALS DAILY. FOLLOW UP WITH [facility] FOR EVALUATION FOR RE-DO NISSEN. FOLLOW UP IN 3 MOS.

## 2012-12-27 NOTE — Telephone Encounter (Signed)
LMOM for pt letting her know that I have scheduled for NP appt for Mon, March 3 @ 930a.  I also stated in the message that the pt needs to bring in her recent UGI report.

## 2012-12-27 NOTE — Progress Notes (Signed)
Faxed to PCP

## 2012-12-27 NOTE — Assessment & Plan Note (Signed)
SX FAIRLY WELL CONTROLLED WITH [name] AND MG CITRATE.

## 2012-12-27 NOTE — Patient Instructions (Addendum)
YOUR SWALLOWING PROBLEM IS DUE TO YOUR PRIOR SURGERY, A WEAK ESOPHAGUS, AND REFLUX.  TO BETTER CONTROL YOUR SYMPTOMS:  1. PUT THE HEAD OF YOUR BED ON 6 INCH BLOCKS. 2. CONTINUE NEXIUM. TAKE 30 MINUTES BEFORE MEALS TWICE DAILY. 3. FOLLOW FULL LIQUID DIET FOR 3 DAYS WITH FLARES OF PAIN, NAUSEA, OR VOMITING THEN FOLLOW A LOW FAT/SOFT MECHANICAL DIET.  SEE INFO BELOW. 4. EAT 4 TO 6 SMALL MEALS DAILY. 5. STAY UPRIGHT 30 MINUTES AFTER EATING. WAIT 3 TO 4 HOURS BEFORE LAYING DOWN AFTER EATING. 6. FOLLOW UP WITH [facility] FOR EVALUATION FOR RE-DO NISSEN. 7. FOLLOW UP IN 3 MOS.    Full Liquid Diet A high-calorie, high-protein supplement should be used to meet your nutritional requirements when the full liquid diet is continued for more than 2 or 3 days. If this diet is to be used for an extended period of time (more than 7 days), a multivitamin should be considered.  Breads and Starches  Allowed: None are allowed except crackersWHOLE OR pureed (made into a thick, smooth soup) in soup. Cooked, refined corn, oat, rice, rye, and wheat cereals are also allowed.   Avoid: Any others.    Potatoes/Pasta/Rice  Allowed: ANY ITEM AS A SOUP OR SMALL PLATE OF MASHED POTATOES OR RICE.       Vegetables  Allowed: Strained tomato or vegetable juice. Vegetables pureed in soup.   Avoid: Any others.    Fruit  Allowed: Any strained fruit juices and fruit drinks. Include 1 serving of citrus or vitamin C-enriched fruit juice daily.   Avoid: Any others.  Meat and Meat Substitutes  Allowed: Egg  Avoid: Any meat, fish, or fowl. All cheese.  Milk  Allowed: SOY Milk beverages, including milk shakes and instant breakfast mixes. Smooth yogurt.   Avoid: Any others. Avoid dairy products if not tolerated.    Soups and Combination Foods  Allowed: Broth, strained cream soups. Strained, broth-based soups.   Avoid: Any others.    Desserts and Sweets  Allowed: flavored gelatin,  tapioca, plain LACTOSE FREE ice cream, sherbet, smooth pudding, junket, fruit ices, frozen ice pops, pudding pops,, frozen fudge pops, chocolate syrup. Sugar, honey, jelly, syrup.   Avoid: Any others.  Fats and Oils  Allowed: Margarine, butter, cream, sour cream, oils.   Avoid: Any others.  Beverages  Allowed: All.   Avoid: None.  Condiments  Allowed: Iodized salt, pepper, spices, flavorings. Cocoa powder.   Avoid: Any others.    SAMPLE MEAL PLAN Breakfast   cup orange juice.   1 cup cooked wheat cereal.   1 cup SOY milk.   1 cup beverage (coffee or tea).   Cream or sugar, if desired.    Midmorning Snack  2 SCRAMBLED OR HARD BOILED EGG   Lunch  1 cup cream soup.    cup fruit juice.   1 cup SOY milk.    cup custard.   1 cup beverage (coffee or tea).   Cream or sugar, if desired.    Midafternoon Snack  1 cup VANILLA SOY milk shake.  Dinner  1 cup cream soup.    cup fruit juice.   1 cup SOY milk.    cup pudding.   1 cup beverage (coffee or tea).   Cream or sugar, if desired.  Evening Snack  1 cup supplement.  To increase calories, add sugar, cream, butter, or margarine if possible. Nutritional supplements will also increase the total calories.   SOFT MECHANICAL DIET This SOFT  MECHANICAL DIET is restricted to:  Foods that are moist, soft-textured, and easy to chew and swallow. MEATS SHOULD BE CHOPPED OR GROUND.  Meats that are ground or are minced no larger than one-quarter inch pieces. Meats are moist with gravy or sauce added.   Foods that do not include bread or bread-like textures except soft pancakes, well-moistened with syrup or sauce.   Textures with some chewing ability required.   Casseroles without rice.   Cooked vegetables that are less than half an inch in size and easily mashed with a fork. No cooked corn, peas, broccoli, cauliflower, cabbage, Brussels sprouts, asparagus, or other fibrous, non-tender or rubbery  cooked vegetables.   Canned fruit except for pineapple. Fruit must be cut into pieces no larger than half an inch in size.   Foods that do not include nuts, seeds, coconut, or sticky textures.    FOOD TEXTURES FOR DYSPHAGIA DIET LEVEL 2 -SOFT MECHANICAL DIET (includes all foods on Dysphagia Diet Level 1 - Pureed, in addition to the foods listed below)  FOOD GROUP: Breads. RECOMMENDED: Soft pancakes, well-moistened with syrup or sauce. AVOID: All others.  FOOD GROUP: Cereals. RECOMMENDED: Cooked cereals with little texture, including oatmeal. Unprocessed wheat bran stirred into cereals for bulk. Note: If thin liquids are restricted, it is important that all of the liquid is absorbed into the cereal. AVOID: All dry cereals and any cooked cereals that may contain flax seeds or other seeds or nuts. Whole-grain, dry, or coarse cereals. Cereals with nuts, seeds, dried fruit, and/or coconut.  FOOD GROUP: Desserts. RECOMMENDED: Pudding, custard. Soft fruit pies with bottom crust only. Canned fruit (excluding pineapple). Soft, moist cakes with icing.Frozen malts, milk shakes, frozen yogurt, eggnog, nutritional supplements, ice cream, sherbet, regular or sugar-free gelatin, or any foods that become thin liquid at either room (70 F) or body temperature (98 F). AVOID: Dry, coarse cakes and cookies. Anything with nuts, seeds, coconut, pineapple, or dried fruit. Breakfast yogurt with nuts. Rice or bread pudding.  FOOD GROUP: Fats. RECOMMENDED: Butter, margarine, cream for cereal (depending on liquid consistency recommendations), gravy, cream sauces, sour cream, sour cream dips with soft additives, mayonnaise, salad dressings, cream cheese, cream cheese spreads with soft additives, whipped toppings. AVOID: All fats with coarse or chunky additives.  FOOD GROUP: Fruits. RECOMMENDED: Soft drained, canned, or cooked fruits without seeds or skin. Fresh soft and ripe banana. Fruit juices with a small amount  of pulp. If thin liquids are restricted, fruit juices should be thickened to appropriate consistency. AVOID: Fresh or frozen fruits. Cooked fruit with skin or seeds. Dried fruits. Fresh, canned, or cooked pineapple.  FOOD GROUP: Meats and Meat Substitutes. (Meat pieces should not exceed 1/4 of an inch cube and should be tender.) RECOMMENDED: Moistened ground or cooked meat, poultry, or fish. Moist ground or tender meat may be served with gravy or sauce. Casseroles without rice. Moist macaroni and cheese, well-cooked pasta with meat sauce, tuna noodle casserole, soft, moist lasagna. Moist meatballs, meatloaf, or fish loaf. Protein salads, such as tuna or egg without large chunks, celery, or onion. Cottage cheese, smooth quiche without large chunks. Poached, scrambled, or soft-cooked eggs (egg yolks should not be "runny" but should be moist and able to be mashed with butter, margarine, or other moisture added to them). (Cook eggs to 160 F or use pasteurized eggs for safety.) Souffls may have small, soft chunks. Tofu. Well-cooked, slightly mashed, moist legumes, such as baked beans. All meats or protein substitutes should be served with  sauces or moistened to help maintain cohesiveness in the oral cavity. AVOID: Dry meats, tough meats (such as bacon, sausage, hot dogs, bratwurst). Dry casseroles or casseroles with rice or large chunks. Peanut butter. Cheese slices and cubes. Hard-cooked or crisp fried eggs. Sandwiches.Pizza.  FOOD GROUP: Potatoes and Starches. RECOMMENDED: Well-cooked, moistened, boiled, baked, or mashed potatoes. Well-cooked shredded hash brown potatoes that are not crisp. (All potatoes need to be moist and in sauces.)Well-cooked noodles in sauce. Spaetzel or soft dumplings that have been moistened with butter or gravy. AVOID: Potato skins and chips. Fried or French-fried potatoes. Rice.  FOOD GROUP: Soups. RECOMMENDED: Soups with easy-to-chew or easy-to-swallow meats or vegetables:  Particle sizes in soups should be less than 1/2 inch. Soups will need to be thickened to appropriate consistency if soup is thinner than prescribed liquid consistency. AVOID: Soups with large chunks of meat and vegetables. Soups with rice, corn, peas.  FOOD GROUP: Vegetables. RECOMMENDED: All soft, well-cooked vegetables. Vegetables should be less than a half inch. Should be easily mashed with a fork. AVOID: Cooked corn and peas. Broccoli, cabbage, Brussels sprouts, asparagus, or other fibrous, non-tender or rubbery cooked vegetables.  FOOD GROUP: Miscellaneous. RECOMMENDED: Jams and preserves without seeds, jelly. Sauces, salsas, etc., that may have small tender chunks less than 1/2 inch. Soft, smooth chocolate bars that are easily chewed. AVOID: Seeds, nuts, coconut, or sticky foods. Chewy candies such as caramels or licorice.      Low-Fat Diet BREADS, CEREALS, PASTA, RICE, DRIED PEAS, AND BEANS These products are high in carbohydrates and most are low in fat. Therefore, they can be increased in the diet as substitutes for fatty foods. They too, however, contain calories and should not be eaten in excess. Cereals can be eaten for snacks as well as for breakfast.   FRUITS AND VEGETABLES It is good to eat fruits and vegetables. Besides being sources of fiber, both are rich in vitamins and some minerals. They help you get the daily allowances of these nutrients. Fruits and vegetables can be used for snacks and desserts.  MEATS Limit lean meat, chicken, [place], and fish to no more than 6 ounces per day. Beef, Pork, and Lamb Use lean cuts of beef, pork, and lamb. Lean cuts include:  Extra-lean ground beef.  Arm roast.  Sirloin tip.  Center-cut ham.  Round steak.  Loin chops.  Rump roast.  Tenderloin.  Trim all fat off the outside of meats before cooking. It is not necessary to severely decrease the intake of red meat, but lean choices should be made. Lean meat is rich in protein and  contains a highly absorbable form of iron. Premenopausal women, in particular, should avoid reducing lean red meat because this could increase the risk for low red blood cells (iron-deficiency anemia).  Chicken and [place] These are good sources of protein. The fat of poultry can be reduced by removing the skin and underlying fat layers before cooking. Chicken and [place] can be substituted for lean red meat in the diet. Poultry should not be fried or covered with high-fat sauces. Fish and Shellfish Fish is a good source of protein. Shellfish contain cholesterol, but they usually are low in saturated fatty acids. The preparation of fish is important. Like chicken and [place], they should not be fried or covered with high-fat sauces. EGGS Egg whites contain no fat or cholesterol. They can be eaten often. Try 1 to 2 egg whites instead of whole eggs in recipes or use egg substitutes that do not  contain yolk. MILK AND DAIRY PRODUCTS Use skim or 1% milk instead of 2% or whole milk. Decrease whole milk, natural, and processed cheeses. Use nonfat or low-fat (2%) cottage cheese or low-fat cheeses made from vegetable oils. Choose nonfat or low-fat (1 to 2%) yogurt. Experiment with evaporated skim milk in recipes that call for heavy cream. Substitute low-fat yogurt or low-fat cottage cheese for sour cream in dips and salad dressings. Have at least 2 servings of low-fat dairy products, such as 2 glasses of skim (or 1%) milk each day to help get your daily calcium intake. FATS AND OILS Reduce the total intake of fats, especially saturated fat. Butterfat, lard, and beef fats are high in saturated fat and cholesterol. These should be avoided as much as possible. Vegetable fats do not contain cholesterol, but certain vegetable fats, such as coconut oil, palm oil, and palm kernel oil are very high in saturated fats. These should be limited. These fats are often used in bakery goods, processed foods, popcorn, oils, and  nondairy creamers. Vegetable shortenings and some peanut butters contain hydrogenated oils, which are also saturated fats. Read the labels on these foods and check for saturated vegetable oils. Unsaturated vegetable oils and fats do not raise blood cholesterol. However, they should be limited because they are fats and are high in calories. Total fat should still be limited to 30% of your daily caloric intake. Desirable liquid vegetable oils are corn oil, cottonseed oil, olive oil, canola oil, safflower oil, soybean oil, and sunflower oil. Peanut oil is not as good, but small amounts are acceptable. Buy a heart-healthy tub margarine that has no partially hydrogenated oils in the ingredients. Mayonnaise and salad dressings often are made from unsaturated fats, but they should also be limited because of their high calorie and fat content. Seeds, nuts, peanut butter, olives, and avocados are high in fat, but the fat is mainly the unsaturated type. These foods should be limited mainly to avoid excess calories and fat. OTHER EATING TIPS Snacks  Most sweets should be limited as snacks. They tend to be rich in calories and fats, and their caloric content outweighs their nutritional value. Some good choices in snacks are graham crackers, melba toast, soda crackers, bagels (no egg), English muffins, fruits, and vegetables. These snacks are preferable to snack crackers, [place] fries, TORTILLA CHIPS, and POTATO chips. Popcorn should be air-popped or cooked in small amounts of liquid vegetable oil. Desserts Eat fruit, low-fat yogurt, and fruit ices instead of pastries, cake, and cookies. Sherbet, angel food cake, gelatin dessert, frozen low-fat yogurt, or other frozen products that do not contain saturated fat (pure fruit juice bars, frozen ice pops) are also acceptable.  COOKING METHODS Choose those methods that use little or no fat. They include: Poaching.  Braising.  Steaming.  Grilling.  Baking.  Stir-frying.   Broiling.  Microwaving.  Foods can be cooked in a nonstick pan without added fat, or use a nonfat cooking spray in regular cookware. Limit fried foods and avoid frying in saturated fat. Add moisture to lean meats by using water, broth, cooking wines, and other nonfat or low-fat sauces along with the cooking methods mentioned above. Soups and stews should be chilled after cooking. The fat that forms on top after a few hours in the refrigerator should be skimmed off. When preparing meals, avoid using excess salt. Salt can contribute to raising blood pressure in some people.  EATING AWAY FROM HOME Order entres, potatoes, and vegetables without sauces or butter.  When meat exceeds the size of a deck of cards (3 to 4 ounces), the rest can be taken home for another meal. Choose vegetable or fruit salads and ask for low-calorie salad dressings to be served on the side. Use dressings sparingly. Limit high-fat toppings, such as bacon, crumbled eggs, cheese, sunflower seeds, and olives. Ask for heart-healthy tub margarine instead of butter.   Patient has been referred to Dr. Daphine Deutscher at [facility] on March 3rd at 9:30 and patient is aware

## 2012-12-27 NOTE — Progress Notes (Signed)
Subjective:    Patient ID: Yesenia Boyd, female    DOB: October 18, 1956, 57 y.o.   MRN: [id number]  PCP: Hasanaj  HPI Last seen by me 2012. Seen as OP BY AS. SAW DR. Marcha Solders AND ORDERED UGI FEB 18 AND SHOWED 13 MM BARIUM TABLET HANGS AT GE JUNCTION, MILD ESOPHAGEAL MOTILITY DISORDER, REFLUX.  WEIGHT STABLE FOR 2 YEARS. HAS REFLUX AND FOOD HANGS UP AND FEELS A PULLING IN HER SIDE. INTESTINES FEEL LIKE THEY ARE ROLLING. LAST REFLUX LAST NIGHT. HAS MOST EPISODE AFTER EATING. MAY HAPPEN IN HER SLEEPS: 1-2X/MO, MAYBE MORE. LINZESS GAVE HER DIARRHEA.DIZZINESS. [name] CAUSE A HEART PALPITATIONS STILL TAKING IT BECAUSE IT'S THE ONLY THING LEFT. BMs: WITH [name] WILL BE REGULAR AND SOMETIMES TAKES CITRATE WITH IT. THINKS SOMETHING IS WRONG WITH HER LIVER BECAUSE SHE HAS RUQ PAIN. LAST HFP: FEB 2013-NL. BEEN SWELLING AND BLOATING. GOT DOWN TO 143 LBS. DOESN'T GET HUNGRY. PT WEARING BELT AROUND WAIST TODAY    Past Medical History  Diagnosis Date  . IBS (irritable bowel syndrome)   . S/P endoscopy 2008, Aug 2010, Sept 2012    Dr. Linna Darner 2008: removal of impacted food bolus, Dr. Linna Darner 2010: moderate gastritis, Sept 2012 with SLF: path with mild gastritis, no definite stricture noted, dilation with Svary 16 mm  . S/P colonoscopy April 2009, Sept 2012    normal Dr. Linna Darner, internal hemorrhoids, repeat in Sept 2022  . Fibromyalgia     sees Dr Philis Kendall at [facility]  . Peripheral neuropathy   . Myasthenia gravis   . Numbness and tingling in right hand     and whole left side of body-pt sts she has "spells to where she cannot walk or talk"    Past Surgical History  Procedure Laterality Date  . Nissen fundoplication    . Cholecystectomy    . Abdominal hysterectomy    . Esophageal surgery?      2008 [facility]  . Hand surgery      carpal tumnnel right and removal of cyst left  . Neck surgery      removal of "knot"t from neck  . Bladder surgery      stretch bladder opening  . Savory dilation  07/18/2011    Procedure:  SAVORY DILATION;  Surgeon: Arlyce Harman, MD;  Location: [facility];  Service: Endoscopy;  Laterality: N/A;  with propofol sedation ; #16 savory dilation   Allergies  Allergen Reactions  . Avelox (Moxifloxacin Hcl In Nacl)   . Cephalexin   . Codeine   . Cymbalta (Duloxetine Hcl)   . Dicyclomine   . Duloxetine   . Macrolides And Ketolides Swelling  . Meperidine Hcl   . Metronidazole (Metronidazole)   . Morphine   . Penicillins   . Prednisone   . Sulfonamide Derivatives   . Tetracycline   . Tramadol Hcl     Current Outpatient Prescriptions  Medication Sig Dispense Refill  . acetaminophen (TYLENOL) 650 MG CR tablet Take 650 mg by mouth every 8 (eight) hours as needed.        Marland Kitchen adapalene (DIFFERIN) 0.1 % gel Apply topically at bedtime.       Marland Kitchen azelastine (ASTELIN) 137 MCG/SPRAY nasal spray Place 1 spray into the nose 2 (two) times daily. Use in each nostril as directed       . azelastine (OPTIVAR) 0.05 % ophthalmic solution 1 drop 2 (two) times daily.       . Cholecalciferol (CVS VIT D 5000 HIGH-POTENCY) 5000 UNITS  capsule Take 5,000 Units by mouth daily.        . cycloSPORINE (RESTASIS) 0.05 % ophthalmic emulsion Place 1 drop into both eyes 2 (two) times daily.       Marland Kitchen desonide (DESOWEN) 0.05 % cream Apply 1 application topically 2 (two) times daily.       . ergocalciferol (VITAMIN D2) 50000 UNITS capsule Take 50,000 Units by mouth once a week.        . esomeprazole (NEXIUM) 40 MG capsule Take 40 mg by mouth 2 (two) times daily.       Marland Kitchen estrogens, conjugated, (PREMARIN) 0.625 MG tablet Take 0.625 mg by mouth daily. Take daily for 21 days then do not take for 7 days.       Marland Kitchen L-Methylfolate-B6-B12 (METANX PO) Take by mouth daily.        Marland Kitchen levocetirizine (XYZAL) 5 MG tablet Take 5 mg by mouth every evening.        .      . lubiprostone ([name]) 24 MCG capsule Take 24 mcg by mouth 2 (two) times daily with a meal.        . magnesium citrate solution Take 296 mLs by mouth daily as needed.  For blockage      . Olopatadine HCl (PATADAY) 0.2 % SOLN Apply to eye.        . simethicone (GAS-X) 80 MG chewable tablet Chew 80 mg by mouth every 6 (six) hours as needed. For gas      . Soap & Cleansers (AQUA GLYCOLIC TONER EX) Apply topically.      . triamcinolone (KENALOG) 0.1 % cream Apply 1 application topically 2 (two) times daily as needed. For skin       . UNABLE TO FIND Med Name: triamicinolone acetonide cream 0.1% prn        . UNABLE TO FIND Apply 1 application topically daily. Med Name: LCD10% sal acid 2 % traim. Use as directed      . UNKNOWN TO PATIENT OXYGEN      2L        At bed time instead of C-Pap for sleep apnea.          Review of Systems     Objective:   Physical Exam  Vitals reviewed. Constitutional: She is oriented to person, place, and time. She appears well-nourished. No distress.  HENT:  Head: Normocephalic and atraumatic.  Mouth/Throat: Oropharynx is clear and moist.  Eyes: Pupils are equal, round, and reactive to light. No scleral icterus.  Neck: Normal range of motion. Neck supple.  Cardiovascular: Normal rate, regular rhythm and normal heart sounds.   Pulmonary/Chest: Effort normal and breath sounds normal. No respiratory distress.  Abdominal: Soft. Bowel sounds are normal. She exhibits no distension. There is tenderness. There is no rebound and no guarding.  MILD TO MODERATE TTP IN THE EPIGASTRIUM   Musculoskeletal: She exhibits no edema.  Lymphadenopathy:    She has no cervical adenopathy.  Neurological: She is alert and oriented to person, place, and time.  NO  NEW FOCAL DEFICITS   Psychiatric:  FLAT AFFECT, NL MOOD           Assessment & Plan:

## 2013-01-04 ENCOUNTER — Ambulatory Visit (INDEPENDENT_AMBULATORY_CARE_PROVIDER_SITE_OTHER): Payer: BC Managed Care – PPO | Admitting: Psychology

## 2013-01-04 DIAGNOSIS — F411 Generalized anxiety disorder: Secondary | ICD-10-CM

## 2013-01-04 DIAGNOSIS — F339 Major depressive disorder, recurrent, unspecified: Secondary | ICD-10-CM | POA: Diagnosis not present

## 2013-01-04 DIAGNOSIS — F419 Anxiety disorder, unspecified: Secondary | ICD-10-CM

## 2013-01-04 NOTE — Progress Notes (Signed)
Pt is aware of OV on 5/7 at 2 with [facility]

## 2013-01-07 ENCOUNTER — Ambulatory Visit (INDEPENDENT_AMBULATORY_CARE_PROVIDER_SITE_OTHER): Payer: BC Managed Care – PPO | Admitting: Surgery

## 2013-01-07 ENCOUNTER — Encounter (HOSPITAL_COMMUNITY): Payer: Self-pay | Admitting: Psychology

## 2013-01-07 ENCOUNTER — Encounter (INDEPENDENT_AMBULATORY_CARE_PROVIDER_SITE_OTHER): Payer: Self-pay | Admitting: Surgery

## 2013-01-07 DIAGNOSIS — Z9889 Other specified postprocedural states: Secondary | ICD-10-CM

## 2013-01-07 DIAGNOSIS — Z09 Encounter for follow-up examination after completed treatment for conditions other than malignant neoplasm: Secondary | ICD-10-CM | POA: Diagnosis not present

## 2013-01-07 NOTE — Progress Notes (Signed)
Chief Complaint:  Dysphagia with unsuccessful esophageal dilatations  History of Present Illness:  Yesenia Boyd is an 57 y.o. female referred by Dr. Fraser Din for Prolonged dysphagia. She had an upper GI series dated 01/04/2013 which I reviewed on the DVD. This was done at [facility].  She has a very long narrowed area consistent with a very tight wrap. She has been dilated unsuccessfully before.  In discussing her surgical history this dysphasia occurred immediately postop and never resolved in the usual 4-6 weeks after a laparoscopic Nissen fundoplication. At that time she was referred to [facility] and [facility] And both were unable to help her. At the time of this procedure there was no harmonic scalpel available. Oftentimes wraps were performed with an adequate mobilization of the proximal stomach and this resulted in a twisting of the wrap that caused this to be very tight although on endoscopy this twisting is not apparent. The patient may well benefit from a redo laparoscopic procedure where the old wrap was taken down if possible and then she is rewrapped over a 56 or 60 dilator.  At the present time I will obtain an esophageal manometry to assess her motility and will plan to see her back after that. It is likely that we will go ahead and discuss redo lap Nissen possible open Nissen with her and proceed.  Past Medical History  Diagnosis Date  . IBS (irritable bowel syndrome)   . S/P endoscopy 2008, Aug 2010, Sept 2012    Dr. Linna Darner 2008: removal of impacted food bolus, Dr. Linna Darner 2010: moderate gastritis, Sept 2012 with SLF: path with mild gastritis, no definite stricture noted, dilation with Svary 16 mm  . S/P colonoscopy April 2009, Sept 2012    normal Dr. Linna Darner, internal hemorrhoids, repeat in Sept 2022  . Fibromyalgia     sees Dr Philis Kendall at [facility]  . Peripheral neuropathy   . Myasthenia gravis   . Numbness and tingling in right hand     and whole left side of body-pt sts she has "spells  to where she cannot walk or talk"    Past Surgical History  Procedure Laterality Date  . Nissen fundoplication    . Cholecystectomy    . Abdominal hysterectomy    . Esophageal surgery?      2008 [facility]  . Hand surgery      carpal tumnnel right and removal of cyst left  . Neck surgery      removal of "knot"t from neck  . Bladder surgery      stretch bladder opening  . Savory dilation  07/18/2011    Procedure: SAVORY DILATION;  Surgeon: Arlyce Harman, MD;  Location: [facility];  Service: Endoscopy;  Laterality: N/A;  with propofol sedation ; #16 savory dilation    Current Outpatient Prescriptions  Medication Sig Dispense Refill  . acetaminophen (TYLENOL) 650 MG CR tablet Take 650 mg by mouth every 8 (eight) hours as needed.        Marland Kitchen azelastine (ASTELIN) 137 MCG/SPRAY nasal spray Place 1 spray into the nose 2 (two) times daily. Use in each nostril as directed       . azelastine (OPTIVAR) 0.05 % ophthalmic solution 1 drop 2 (two) times daily.       Marland Kitchen CARAFATE 1 GM/10ML suspension       . Cholecalciferol (CVS VIT D 5000 HIGH-POTENCY) 5000 UNITS capsule Take 5,000 Units by mouth daily.        . cycloSPORINE (RESTASIS)  0.05 % ophthalmic emulsion Place 1 drop into both eyes 2 (two) times daily.       . ergocalciferol (VITAMIN D2) 50000 UNITS capsule Take 50,000 Units by mouth once a week.        . esomeprazole (NEXIUM) 40 MG capsule Take 40 mg by mouth 2 (two) times daily.       Marland Kitchen estrogens, conjugated, (PREMARIN) 0.625 MG tablet Take 0.625 mg by mouth daily. Take daily for 21 days then do not take for 7 days.       . hydrOXYzine (VISTARIL) 25 MG capsule       . L-Methylfolate-B6-B12 (METANX PO) Take by mouth daily.        Marland Kitchen levocetirizine (XYZAL) 5 MG tablet Take 5 mg by mouth every evening.        . Linaclotide (LINZESS) 290 MCG CAPS Take 1 capsule by mouth daily. 30 minutes before first meal of the day  30 capsule  0  . lubiprostone ([name]) 24 MCG capsule Take 24 mcg by mouth 2 (two) times  daily with a meal.        . magnesium citrate solution Take 296 mLs by mouth daily as needed. For blockage      . Olopatadine HCl (PATADAY) 0.2 % SOLN Apply to eye.        . simethicone (GAS-X) 80 MG chewable tablet Chew 80 mg by mouth every 6 (six) hours as needed. For gas      . tiZANidine (ZANAFLEX) 4 MG tablet       . triamcinolone (KENALOG) 0.1 % cream Apply 1 application topically 2 (two) times daily as needed. For skin       . UNABLE TO FIND Med Name: triamicinolone acetonide cream 0.1% prn        . UNABLE TO FIND Apply 1 application topically daily. Med Name: LCD10% sal acid 2 % traim. Use as directed      . UNKNOWN TO PATIENT OXYGEN      2L        At bed time instead of C-Pap for sleep apnea.        No current facility-administered medications for this visit.   Avelox; Cephalexin; Codeine; Cymbalta; Dicyclomine; Duloxetine; Macrolides and ketolides; Meperidine hcl; Metronidazole; Morphine; Penicillins; Prednisone; Sulfonamide derivatives; Tetracycline; and Tramadol hcl Family History  Problem Relation Age of Onset  . Colon cancer Cousin     4 cousins  . Anesthesia problems Sister   . Cancer Sister     breast  . Hypertension Sister   . Pseudochol deficiency Neg Hx   . Hypotension Neg Hx   . Malignant hyperthermia Neg Hx   . Parkinson's disease Mother   . Kidney disease Mother   . Diabetes Mother   . Heart attack Father   . Cancer Father     stomach cancer  . Hypertension Brother   . Diabetes Brother   . Arthritis Brother   . Kidney disease Brother   . Parkinson's disease Brother   . Cancer Maternal Aunt     breast  . Cancer Maternal Grandmother     breast   Social History:   reports that she has never smoked. She does not have any smokeless tobacco history on file. She reports that  drinks alcohol. She reports that she does not use illicit drugs.   REVIEW OF SYSTEMS - PERTINENT POSITIVES ONLY: Positive for marked weight loss after her procedure. Her asthma could  be related to reflux and she  has now based more on pain too tight.  Physical Exam:   There were no vitals taken for this visit. There is no weight on file to calculate BMI.  Gen:  WDWN AAF NAD  Neurological: Alert and oriented to person, place, and time. Motor and sensory function is grossly intact  Head: Normocephalic and atraumatic.  Eyes: Conjunctivae are normal. Pupils are equal, round, and reactive to light. No scleral icterus.  Neck: Normal range of motion. Neck supple. No tracheal deviation or thyromegaly present.  Cardiovascular:  SR without murmurs or gallops.  No carotid bruits Respiratory: Effort normal.  No respiratory distress. No chest wall tenderness. Breath sounds normal.  No wheezes, rales or rhonchi.  Abdomen:  Well healed incisions from prior laparoscopic Nissen fundoplication GU: Musculoskeletal: Normal range of motion. Extremities are nontender. No cyanosis, edema or clubbing noted Lymphadenopathy: No cervical, preauricular, postauricular or axillary adenopathy is present Skin: Skin is warm and dry. No rash noted. No diaphoresis. No erythema. No pallor. Pscyh: Normal mood and affect. Behavior is normal. Judgment and thought content normal.   LABORATORY RESULTS: No results found for this or any previous visit (from the past 48 hour(s)).  RADIOLOGY RESULTS: No results found.  Problem List: Patient Active Problem List  Diagnosis  . PERIPHERAL NEUROPATHY, FEET  . GERD  . IRRITABLE BOWEL, PREDOMINANTLY CONSTIPATION  . RECTAL PAIN  . FIBROMYALGIA  . NEONATAL MYASTHENIA GRAVIS  . DYSPHAGIA UNSPECIFIED  . ALLERGY, HX OF  . Rectal bleeding  . Abdominal  pain, other specified site  . Lap Nissen 1993 [place], [place] with postop prolonged dysphagia    Assessment & Plan: Rosetti modification of Nissen with twisting of wrap and stricture.  Plan motility study and then probable takedown and redo lap Nissen.     Matt B. Daphine Deutscher, MD, [facility] [phone] beeper [phone]  01/07/2013 10:36 AM

## 2013-01-07 NOTE — Progress Notes (Signed)
Patient:  Yesenia Boyd   DOB: [birth year]  MR Number: [id number]  Location: [facility] [address] [place] [address] Dept: [phone]  Start: 4 PM End: 5 PM  Provider/Observer:     Hershal Coria PSYD  Chief Complaint:      Chief Complaint  Patient presents with  . Anxiety  . Depression    Reason For Service:     The patient initially presented with issues of depression, anxiety, and adjustment difficulties. She has had a lot of traumatic and stressful events in her life that she has difficulty coping with. The patient has numerous medical issues that can be found in her medical chart. Many these have some components to difficulty with sympathetic arousal and anxiety issues.  Interventions Strategy:  Cognitive/behavioral psychotherapeutic interventions  Participation Level:   Active  Participation Quality:  Appropriate      Behavioral Observation:  Well Groomed, Alert, and Appropriate.   Current Psychosocial Factors: The patient is still having difficult with her son who is been very sick and really can't take care of his medical situation is on. The patient's daughter is also in some significant difficulty as well.  Content of Session:   Reviewed current symptoms and continued work on therapeutic interventions for depression and anxiety.  Current Status:   The patient reports that continued psychosocial stressors are having a negative impact on her mood state. Depression and anxiety continue..  Patient Progress:   Very good  Target Goals:   Target goals include reducing the intensity, duration, and frequency of significant episodes of depression and anxiety.  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  Impression/Diagnosis:   The patient is a long-standing history  of recurrent depression and anxiety. Fibromyalgia and irritable bowel syndrome are also present. 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  Diagnosis:    Axis I:  Major depression, recurrent  Anxiety      Axis II: No diagnosis

## 2013-01-07 NOTE — Patient Instructions (Signed)
Thanks for your patience.  If you need further assistance after leaving the office, please call our office and speak with a [facility] nurse.  [phone].  If you want to leave a message for Dr. Martin, please call his office phone at [phone]. 

## 2013-01-15 ENCOUNTER — Telehealth (INDEPENDENT_AMBULATORY_CARE_PROVIDER_SITE_OTHER): Payer: Self-pay

## 2013-01-15 NOTE — Telephone Encounter (Signed)
Pt called asking what her eating restrictions will be prior to manometry. I advised pt she should be npo after midnight prior to test. Pt states she understands.

## 2013-01-21 ENCOUNTER — Encounter (HOSPITAL_COMMUNITY): Payer: Self-pay | Admitting: *Deleted

## 2013-01-21 ENCOUNTER — Ambulatory Visit (HOSPITAL_COMMUNITY)
Admission: RE | Admit: 2013-01-21 | Discharge: 2013-01-21 | Disposition: A | Discharge status: Home / Self Care | Payer: BC Managed Care – PPO | Source: Ambulatory Visit | Attending: Surgery | Admitting: Surgery

## 2013-01-21 ENCOUNTER — Encounter (HOSPITAL_COMMUNITY)
Admission: RE | Disposition: A | Discharge status: Home / Self Care | Payer: Self-pay | Source: Ambulatory Visit | Attending: Surgery

## 2013-01-21 DIAGNOSIS — R1310 Dysphagia, unspecified: Secondary | ICD-10-CM | POA: Insufficient documentation

## 2013-01-21 HISTORY — PX: ESOPHAGEAL MANOMETRY: SHX5429

## 2013-01-21 SURGERY — MANOMETRY, ESOPHAGUS

## 2013-01-21 MED ORDER — LIDOCAINE VISCOUS 2 % MT SOLN
OROMUCOSAL | Status: AC
  Filled 2013-01-21: qty 15

## 2013-01-22 ENCOUNTER — Ambulatory Visit (INDEPENDENT_AMBULATORY_CARE_PROVIDER_SITE_OTHER): Payer: BC Managed Care – PPO | Admitting: Surgery

## 2013-01-22 ENCOUNTER — Encounter (HOSPITAL_COMMUNITY): Payer: Self-pay | Admitting: Surgery

## 2013-01-22 VITALS — BP 110/78 | HR 74 | Resp 18 | Ht 62.0 in | Wt 144.0 lb

## 2013-01-22 DIAGNOSIS — R1310 Dysphagia, unspecified: Secondary | ICD-10-CM | POA: Diagnosis not present

## 2013-01-22 DIAGNOSIS — Z9889 Other specified postprocedural states: Secondary | ICD-10-CM

## 2013-01-22 NOTE — Patient Instructions (Signed)
Nissen Fundoplication Care After Please read the instructions outlined below and refer to this sheet for the next few weeks. These discharge instructions provide you with general information on caring for yourself after you leave the hospital. Your doctor may also give you specific instructions. While your treatment has been planned according to the most current medical practices available, unavoidable complications sometimes happen. If you have any problems or questions after discharge, please call your doctor. ACTIVITY  Take frequent rest periods throughout the day.  Take frequent walks throughout the day. This will help to prevent blood clots.  Continue to do your coughing and deep breathing exercises once you get home. This will help to prevent pneumonia.  No strenuous activities such as heavy lifting, pushing or pulling until after your follow-up visit with your doctor. Do not lift anything heavier than 10 pounds.  Talk with your caregiver about when you may return to work and your exercise routine.  You may shower 2 days after surgery. Pat incisions dry. Do not rub incisions with washcloth or towel.  Do not drive while taking prescription pain medication. NUTRITION  Continue with a liquid diet, or the diet you were directed to take, until your first follow-up visit with your surgeon.  Drink fluids (6-8 glasses a day).  Call your caregiver for persistent nausea (feeling sick to your stomach), vomiting, bloating or difficulty swallowing. ELIMINATION It is very important not to strain during bowel movements. If constipation should occur, you may:  Take a mild laxative (such as Milk of Magnesia).  Add fruit and bran to your diet.  Drink more fluids.  Call your caregiver if constipation is not relieved. FEVER If you feel feverish or have shaking chills, take your temperature. If it is 102 F (38.9 C) or above, call your caregiver. The fever may mean there is an infection. PAIN  CONTROL  If a prescription was given for a pain reliever, please follow your caregiver's directions.  Only take over-the-counter or prescription medicines for pain, discomfort, or fever as directed by your caregiver.  If the pain is not relieved by your medicine, becomes worse, or you have difficulty breathing, call your doctor. INCISION  It is normal for your cuts (incisions) from surgery to have a small amount of drainage for the first 1-2 days. Once the drainage has stopped, leave your incision(s) open to air.  Check your incision(s) and surrounding area daily for any redness, swelling, increased drainage or bleeding. If any of these are present or if the wound edges start to separate, call your doctor.  If you have small adhesive strips in place, they will peel and fall off. (If these strips are covered with a clear bandage, your doctor will tell you when to remove them.)  If you have staples, your caregiver will remove them at the follow-up appointment. Document Released: 06/16/2004 Document Revised: 01/16/2012 Document Reviewed: 09/20/2007 Exit[employer] Patient Information 2013 [employer].  

## 2013-01-22 NOTE — Progress Notes (Signed)
Chief Complaint:  Dysphagia with unsuccessful esophageal dilatations  History of Present Illness:  Yesenia Boyd is an 57 y.o. female referred by Dr. Fraser Din for Prolonged dysphagia. She had an upper GI series dated 01/04/2013 which I reviewed on the DVD. This was done at [facility].  She has a very long narrowed area consistent with a very tight wrap. She has been dilated unsuccessfully before.  In discussing her surgical history this dysphasia occurred immediately postop and never resolved in the usual 4-6 weeks after a laparoscopic Nissen fundoplication. At that time she was referred to [facility] and [facility] And both were unable to help her. At the time of this procedure there was no harmonic scalpel available. Oftentimes wraps were performed with an adequate mobilization of the proximal stomach and this resulted in a twisting of the wrap that caused this to be very tight although on endoscopy this twisting is not apparent. The patient may well benefit from a redo laparoscopic procedure where the old wrap was taken down if possible and then she is rewrapped over a 56 or 60 dilator.  At the present time I will obtain an esophageal manometry to assess her motility and will plan to see her back after that. It is likely that we will go ahead and discuss redo lap Nissen possible open Nissen with her and proceed.  Past Medical History  Diagnosis Date  . IBS (irritable bowel syndrome)   . S/P endoscopy 2008, Aug 2010, Sept 2012    Dr. Linna Darner 2008: removal of impacted food bolus, Dr. Linna Darner 2010: moderate gastritis, Sept 2012 with SLF: path with mild gastritis, no definite stricture noted, dilation with Svary 16 mm  . S/P colonoscopy April 2009, Sept 2012    normal Dr. Linna Darner, internal hemorrhoids, repeat in Sept 2022  . Fibromyalgia     sees Dr Philis Kendall at [facility]  . Peripheral neuropathy   . Myasthenia gravis   . Numbness and tingling in right hand     and whole left side of body-pt sts she has "spells  to where she cannot walk or talk"    Past Surgical History  Procedure Laterality Date  . Nissen fundoplication    . Cholecystectomy    . Abdominal hysterectomy    . Esophageal surgery?      2008 [facility]  . Hand surgery      carpal tumnnel right and removal of cyst left  . Neck surgery      removal of "knot"t from neck  . Bladder surgery      stretch bladder opening  . Savory dilation  07/18/2011    Procedure: SAVORY DILATION;  Surgeon: Arlyce Harman, MD;  Location: [facility];  Service: Endoscopy;  Laterality: N/A;  with propofol sedation ; #16 savory dilation  . Esophageal manometry N/A 01/21/2013    Procedure: ESOPHAGEAL MANOMETRY (EM);  Surgeon: Valarie Merino, MD;  Location: [facility];  Service: General;  Laterality: N/A;    Current Outpatient Prescriptions  Medication Sig Dispense Refill  . acetaminophen (TYLENOL) 650 MG CR tablet Take 650 mg by mouth every 8 (eight) hours as needed.        Marland Kitchen azelastine (ASTELIN) 137 MCG/SPRAY nasal spray Place 1 spray into the nose 2 (two) times daily. Use in each nostril as directed       . azelastine (OPTIVAR) 0.05 % ophthalmic solution 1 drop 2 (two) times daily.       Marland Kitchen CARAFATE 1 GM/10ML suspension       .  Cholecalciferol ([employer] VIT D 5000 HIGH-POTENCY) 5000 UNITS capsule Take 5,000 Units by mouth daily.        . cycloSPORINE (RESTASIS) 0.05 % ophthalmic emulsion Place 1 drop into both eyes 2 (two) times daily.       . ergocalciferol (VITAMIN D2) 50000 UNITS capsule Take 50,000 Units by mouth once a week.        . esomeprazole (NEXIUM) 40 MG capsule Take 40 mg by mouth 2 (two) times daily.       Marland Kitchen estrogens, conjugated, (PREMARIN) 0.625 MG tablet Take 0.625 mg by mouth daily. Take daily for 21 days then do not take for 7 days.       . hydrOXYzine (VISTARIL) 25 MG capsule       . L-Methylfolate-B6-B12 (METANX PO) Take by mouth daily.        Marland Kitchen levocetirizine (XYZAL) 5 MG tablet Take 5 mg by mouth every evening.        . Linaclotide (LINZESS)  290 MCG CAPS Take 1 capsule by mouth daily. 30 minutes before first meal of the day  30 capsule  0  . lubiprostone ([name]) 24 MCG capsule Take 24 mcg by mouth 2 (two) times daily with a meal.        . magnesium citrate solution Take 296 mLs by mouth daily as needed. For blockage      . Olopatadine HCl (PATADAY) 0.2 % SOLN Apply to eye.        . simethicone (GAS-X) 80 MG chewable tablet Chew 80 mg by mouth every 6 (six) hours as needed. For gas      . tiZANidine (ZANAFLEX) 4 MG tablet       . triamcinolone (KENALOG) 0.1 % cream Apply 1 application topically 2 (two) times daily as needed. For skin       . UNABLE TO FIND Med Name: triamicinolone acetonide cream 0.1% prn        . UNABLE TO FIND Apply 1 application topically daily. Med Name: LCD10% sal acid 2 % traim. Use as directed      . UNKNOWN TO PATIENT OXYGEN      2L        At bed time instead of C-Pap for sleep apnea.        No current facility-administered medications for this visit.   Avelox; Cephalexin; Codeine; Cymbalta; Dicyclomine; Duloxetine; Macrolides and ketolides; Meperidine hcl; Metronidazole; Morphine; Penicillins; Prednisone; Sulfonamide derivatives; Tetracycline; and Tramadol hcl Family History  Problem Relation Age of Onset  . Colon cancer Cousin     4 cousins  . Anesthesia problems Sister   . Cancer Sister     breast  . Hypertension Sister   . Pseudochol deficiency Neg Hx   . Hypotension Neg Hx   . Malignant hyperthermia Neg Hx   . Parkinson's disease Mother   . Kidney disease Mother   . Diabetes Mother   . Heart attack Father   . Cancer Father     stomach cancer  . Hypertension Brother   . Diabetes Brother   . Arthritis Brother   . Kidney disease Brother   . Parkinson's disease Brother   . Cancer Maternal Aunt     breast  . Cancer Maternal Grandmother     breast   Social History:   reports that she has never smoked. She does not have any smokeless tobacco history on file. She reports that  drinks  alcohol. She reports that she does not use illicit drugs.  REVIEW OF SYSTEMS - PERTINENT POSITIVES ONLY: Positive for marked weight loss after her procedure. Her asthma could be related to reflux and she has now based more on pain too tight.  Physical Exam:   Blood pressure 110/78, pulse 74, resp. rate 18, height 5\' 2"  (1.575 m), weight 144 lb (65.318 kg). Body mass index is 26.33 kg/(m^2).  Gen:  WDWN AAF NAD  Neurological: Alert and oriented to person, place, and time. Motor and sensory function is grossly intact  Head: Normocephalic and atraumatic.  Eyes: Conjunctivae are normal. Pupils are equal, round, and reactive to light. No scleral icterus.  Neck: Normal range of motion. Neck supple. No tracheal deviation or thyromegaly present.  Cardiovascular:  SR without murmurs or gallops.  No carotid bruits Respiratory: Effort normal.  No respiratory distress. No chest wall tenderness. Breath sounds normal.  No wheezes, rales or rhonchi.  Abdomen:  Well healed incisions from prior laparoscopic Nissen fundoplication GU: Musculoskeletal: Normal range of motion. Extremities are nontender. No cyanosis, edema or clubbing noted Lymphadenopathy: No cervical, preauricular, postauricular or axillary adenopathy is present Skin: Skin is warm and dry. No rash noted. No diaphoresis. No erythema. No pallor. Pscyh: Normal mood and affect. Behavior is normal. Judgment and thought content normal.   LABORATORY RESULTS: No results found for this or any previous visit (from the past 48 hour(s)).  RADIOLOGY RESULTS: No results found.  Problem List: Patient Active Problem List  Diagnosis  . PERIPHERAL NEUROPATHY, FEET  . GERD  . IRRITABLE BOWEL, PREDOMINANTLY CONSTIPATION  . RECTAL PAIN  . FIBROMYALGIA  . NEONATAL MYASTHENIA GRAVIS  . DYSPHAGIA UNSPECIFIED  . ALLERGY, HX OF  . Rectal bleeding  . Abdominal  pain, other specified site  . Lap Nissen 1993 [place], [place] with postop prolonged  dysphagia    Assessment & Plan: Rosetti modification of Nissen with twisting of wrap and stricture.  Plan motility study and then probable takedown and redo lap Nissen. Manometry prelim report reviewed over the phone--distal obstruction with weakened peristasis She brought in endo pics from [facility] and these appear to show twisting of the wrap. Plan lap/open take down and redo Nissen if possible.  She is aware of the risk and benefits.     Matt B. Daphine Deutscher, MD, [facility] [phone] beeper [phone]  01/22/2013 3:28 PM

## 2013-01-24 ENCOUNTER — Encounter (HOSPITAL_COMMUNITY): Payer: Self-pay | Admitting: Pharmacy Technician

## 2013-01-24 ENCOUNTER — Other Ambulatory Visit (INDEPENDENT_AMBULATORY_CARE_PROVIDER_SITE_OTHER): Payer: Self-pay | Admitting: *Deleted

## 2013-01-25 ENCOUNTER — Telehealth: Payer: Self-pay

## 2013-01-25 ENCOUNTER — Telehealth (INDEPENDENT_AMBULATORY_CARE_PROVIDER_SITE_OTHER): Payer: Self-pay

## 2013-01-25 NOTE — Telephone Encounter (Signed)
She really needs to FU with surgeon for her surgery. We can try to change PPIs but I doubt this will help.

## 2013-01-25 NOTE — Telephone Encounter (Signed)
Called and informed pt. She is scheduled for the surgery on 02/13/2013. She will check with them and see if it can be done earlier. Per Lorenza Burton, NP, explained that is what is going to help her. Pt doesn't want to try different PPI.

## 2013-01-25 NOTE — Telephone Encounter (Signed)
Pt left Vm and I returned her call. She said she is having a lot of burning in her chest and abdomen and it has been going on about a month. She is taking the Nexium bid and the Carafate as directed. She wants to know if there are any other recommendations. Please advise!

## 2013-01-25 NOTE — Telephone Encounter (Signed)
Pt called stating her symptoms are increasing and she wants to know if her surgery can be moved up. Pt states she spoke with her GI MD office today and was advised to call our office to see if surgery could be moved up. Pt advised I will send msg to Dr Daphine Deutscher and his assistant re: this request. Pt can be reached at [phone] or [phone].

## 2013-01-28 DIAGNOSIS — M25569 Pain in unspecified knee: Secondary | ICD-10-CM | POA: Diagnosis not present

## 2013-01-29 NOTE — Telephone Encounter (Signed)
REVIEWED.  

## 2013-02-01 ENCOUNTER — Ambulatory Visit (INDEPENDENT_AMBULATORY_CARE_PROVIDER_SITE_OTHER): Payer: BC Managed Care – PPO | Admitting: Psychology

## 2013-02-01 DIAGNOSIS — F419 Anxiety disorder, unspecified: Secondary | ICD-10-CM

## 2013-02-01 DIAGNOSIS — F339 Major depressive disorder, recurrent, unspecified: Secondary | ICD-10-CM | POA: Diagnosis not present

## 2013-02-01 DIAGNOSIS — F411 Generalized anxiety disorder: Secondary | ICD-10-CM

## 2013-02-04 ENCOUNTER — Encounter (HOSPITAL_COMMUNITY): Payer: Self-pay

## 2013-02-04 ENCOUNTER — Encounter (HOSPITAL_COMMUNITY): Payer: Self-pay | Admitting: Psychology

## 2013-02-04 ENCOUNTER — Ambulatory Visit (HOSPITAL_COMMUNITY)
Admission: RE | Admit: 2013-02-04 | Discharge: 2013-02-04 | Disposition: A | Discharge status: Home / Self Care | Payer: BC Managed Care – PPO | Source: Ambulatory Visit | Attending: Surgery | Admitting: Surgery

## 2013-02-04 ENCOUNTER — Encounter (HOSPITAL_COMMUNITY)
Admission: RE | Admit: 2013-02-04 | Discharge: 2013-02-04 | Disposition: A | Discharge status: Home / Self Care | Payer: BC Managed Care – PPO | Source: Ambulatory Visit | Attending: Surgery | Admitting: Surgery

## 2013-02-04 DIAGNOSIS — Z01818 Encounter for other preprocedural examination: Secondary | ICD-10-CM | POA: Diagnosis not present

## 2013-02-04 DIAGNOSIS — R1310 Dysphagia, unspecified: Secondary | ICD-10-CM | POA: Insufficient documentation

## 2013-02-04 DIAGNOSIS — Z01810 Encounter for preprocedural cardiovascular examination: Secondary | ICD-10-CM | POA: Insufficient documentation

## 2013-02-04 DIAGNOSIS — J45909 Unspecified asthma, uncomplicated: Secondary | ICD-10-CM | POA: Diagnosis not present

## 2013-02-04 DIAGNOSIS — Z01812 Encounter for preprocedural laboratory examination: Secondary | ICD-10-CM | POA: Insufficient documentation

## 2013-02-04 HISTORY — DX: Sleep apnea, unspecified: G47.30

## 2013-02-04 LAB — COMPREHENSIVE METABOLIC PANEL
ALT: 15 U/L (ref 0–35)
AST: 21 U/L (ref 0–37)
Albumin: 3.6 g/dL (ref 3.5–5.2)
Alkaline Phosphatase: 42 U/L (ref 39–117)
BUN: 9 mg/dL (ref 6–23)
CO2: 28 mEq/L (ref 19–32)
Calcium: 9.8 mg/dL (ref 8.4–10.5)
Chloride: 99 mEq/L (ref 96–112)
Creatinine, Ser: 0.79 mg/dL (ref 0.50–1.10)
GFR calc Af Amer: >90 mL/min (ref >90)
GFR calc non Af Amer: >90 mL/min (ref >90)
Glucose, Bld: 88 mg/dL (ref 70–99)
Potassium: 3.4 mEq/L — ABNORMAL LOW (ref 3.5–5.1)
Sodium: 137 mEq/L (ref 135–145)
Total Bilirubin: 0.3 mg/dL (ref 0.3–1.2)
Total Protein: 7.3 g/dL (ref 6.0–8.3)

## 2013-02-04 LAB — CBC
HCT: 33.9 % — ABNORMAL LOW (ref 36.0–46.0)
Hemoglobin: 11.7 g/dL — ABNORMAL LOW (ref 12.0–15.0)
MCH: 27.3 pg (ref 26.0–34.0)
MCHC: 34.5 g/dL (ref 30.0–36.0)
MCV: 79.2 fL (ref 78.0–100.0)
Platelets: 229 10*3/uL (ref 150–400)
RBC: 4.28 MIL/uL (ref 3.87–5.11)
RDW: 12.5 % (ref 11.5–15.5)
WBC: 5.5 10*3/uL (ref 4.0–10.5)

## 2013-02-04 LAB — SURGICAL PCR SCREEN
MRSA, PCR: NEGATIVE
Staphylococcus aureus: NEGATIVE

## 2013-02-04 MED ORDER — CHLORHEXIDINE GLUCONATE 4 % EX LIQD
1.0000 "application " | Freq: Once | CUTANEOUS | Status: DC
  Filled 2013-02-04: qty 15

## 2013-02-04 NOTE — Progress Notes (Signed)
Patient:  Yesenia Boyd   DOB: 06/07/1956  MR Number: [id number]  Location: [facility] [address] [place] [address] Dept: [phone]  Start: 4 PM End: 5 PM  Provider/Observer:     Hershal Coria PSYD  Chief Complaint:      Chief Complaint  Patient presents with  . Anxiety  . Depression    Reason For Service:     The patient initially presented with issues of depression, anxiety, and adjustment difficulties. She has had a lot of traumatic and stressful events in her life that she has difficulty coping with. The patient has numerous medical issues that can be found in her medical chart. Many these have some components to difficulty with sympathetic arousal and anxiety issues.  Interventions Strategy:  Cognitive/behavioral psychotherapeutic interventions  Participation Level:   Active  Participation Quality:  Appropriate      Behavioral Observation:  Well Groomed, Alert, and Appropriate.   Current Psychosocial Factors: The patient continues to be very stressed by a number of medical issues and she has had some new ones crop up recently. She is planning on having surgery soon and has been worrying more about the prospect of the surgery.  Content of Session:   Reviewed current symptoms and continued work on therapeutic interventions for depression and anxiety.  Current Status:   The patient reports that continued psychosocial stressors are having a negative impact on her mood state. Depression and anxiety continue..  Patient Progress:   Very good  Target Goals:   Target goals include reducing the intensity, duration, and frequency of significant episodes of depression and anxiety.  Last Reviewed:   02/01/2013  Goals Addressed Today:    Today we worked on Producer, television/film/video and strategies are in issues of her depression and anxiety.  Impression/Diagnosis:   The patient is a  long-standing history of recurrent depression and anxiety. Fibromyalgia and irritable bowel syndrome are also present. 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  Diagnosis:    Axis I:  Major depression, recurrent  Anxiety      Axis II: No diagnosis

## 2013-02-04 NOTE — Patient Instructions (Addendum)
20 Yesenia Boyd  02/04/2013   Your procedure is scheduled on:  4-9 -2014  Report to [facility] at     0630   AM ..  Call this number if you have problems the morning of surgery: [phone]  Or Presurgical Testing [phone](Byrant Valent)   Remember: Follow any bowel prep instructions per MD office. For Cpap use: Bring mask and tubing only.( Patient states"will not bring" "can't tolerate"-"have to use nasal Prongs".   Do not eat food:After Midnight.    Take these medicines the morning of surgery with A SIP OF WATER: Tylenol. Nexium. Premarin.Xyzal. Use Astelin nasal sparay and bring. Albuterol as needed   Do not wear jewelry, make-up or nail polish.  Do not wear lotions, powders, or perfumes. You may wear deodorant.  Do not shave 12 hours prior to first CHG shower(legs and under arms).(face and neck okay.)  Do not bring valuables to the hospital.  Contacts, dentures or bridgework,body piercing,  may not be worn into surgery.  Leave suitcase in the car. After surgery it may be brought to your room.  For patients admitted to the hospital, checkout time is 11:00 AM the day of discharge.   Patients discharged the day of surgery will not be allowed to drive home. Must have responsible person with you x 24 hours once discharged.  Name and phone number of your driver: [name], spouse [phone] cell  Special Instructions: CHG(Chlorhedine 4%-"Hibiclens","Betasept","Aplicare") Shower Use Special Wash: see special instructions.(avoid face and genitals)   Please read over the following fact sheets that you were given: MRSA Information.Incentive Spirometry Instruction.    Failure to follow these instructions may result in Cancellation of your surgery.   Patient signature_______________________________________________________

## 2013-02-05 ENCOUNTER — Telehealth (INDEPENDENT_AMBULATORY_CARE_PROVIDER_SITE_OTHER): Payer: Self-pay

## 2013-02-05 NOTE — Telephone Encounter (Signed)
Returned pt call regarding her films.  Pt stated that she gave MM films at her last visit and that he promised them back to her within 3 days.  After looking (hands off) in his office I let the pt know that her films are not here but most likely with him.  I let her know that he is not due back into the office until next Thursday, to which she stated "my sx is next Wednesday, are you telling me he isn't doing my sx?" I replied that MM will be performing her Sx, though I will not see him before then.

## 2013-02-13 ENCOUNTER — Ambulatory Visit (HOSPITAL_COMMUNITY): Payer: BC Managed Care – PPO | Admitting: Anesthesiology

## 2013-02-13 ENCOUNTER — Encounter (HOSPITAL_COMMUNITY)
Admission: RE | Disposition: A | Discharge status: Home / Self Care | Payer: Self-pay | Source: Ambulatory Visit | Attending: Surgery

## 2013-02-13 ENCOUNTER — Encounter (HOSPITAL_COMMUNITY): Payer: Self-pay | Admitting: *Deleted

## 2013-02-13 ENCOUNTER — Encounter (HOSPITAL_COMMUNITY): Payer: Self-pay | Admitting: Anesthesiology

## 2013-02-13 ENCOUNTER — Observation Stay (HOSPITAL_COMMUNITY)
Admission: RE | Admit: 2013-02-13 | Discharge: 2013-02-15 | Disposition: A | Discharge status: Home / Self Care | Payer: BC Managed Care – PPO | Source: Ambulatory Visit | Attending: Surgery | Admitting: Surgery

## 2013-02-13 DIAGNOSIS — G4710 Hypersomnia, unspecified: Secondary | ICD-10-CM | POA: Diagnosis not present

## 2013-02-13 DIAGNOSIS — G7000 Myasthenia gravis without (acute) exacerbation: Secondary | ICD-10-CM | POA: Diagnosis not present

## 2013-02-13 DIAGNOSIS — Y838 Other surgical procedures as the cause of abnormal reaction of the patient, or of later complication, without mention of misadventure at the time of the procedure: Secondary | ICD-10-CM | POA: Diagnosis not present

## 2013-02-13 DIAGNOSIS — K219 Gastro-esophageal reflux disease without esophagitis: Secondary | ICD-10-CM | POA: Diagnosis not present

## 2013-02-13 DIAGNOSIS — G4730 Sleep apnea, unspecified: Secondary | ICD-10-CM | POA: Insufficient documentation

## 2013-02-13 DIAGNOSIS — IMO0001 Reserved for inherently not codable concepts without codable children: Secondary | ICD-10-CM | POA: Diagnosis not present

## 2013-02-13 DIAGNOSIS — Z79899 Other long term (current) drug therapy: Secondary | ICD-10-CM | POA: Diagnosis not present

## 2013-02-13 DIAGNOSIS — K929 Disease of digestive system, unspecified: Principal | ICD-10-CM | POA: Insufficient documentation

## 2013-02-13 DIAGNOSIS — R1310 Dysphagia, unspecified: Secondary | ICD-10-CM | POA: Insufficient documentation

## 2013-02-13 DIAGNOSIS — G609 Hereditary and idiopathic neuropathy, unspecified: Secondary | ICD-10-CM | POA: Insufficient documentation

## 2013-02-13 DIAGNOSIS — Z9889 Other specified postprocedural states: Secondary | ICD-10-CM

## 2013-02-13 HISTORY — PX: LAPAROSCOPIC NISSEN FUNDOPLICATION: SHX1932

## 2013-02-13 LAB — CBC
HCT: 30.8 % — ABNORMAL LOW (ref 36.0–46.0)
Hemoglobin: 10.6 g/dL — ABNORMAL LOW (ref 12.0–15.0)
MCH: 27 pg (ref 26.0–34.0)
MCHC: 34.4 g/dL (ref 30.0–36.0)
MCV: 78.6 fL (ref 78.0–100.0)
Platelets: 184 10*3/uL (ref 150–400)
RBC: 3.92 MIL/uL (ref 3.87–5.11)
RDW: 12.7 % (ref 11.5–15.5)
WBC: 8.5 10*3/uL (ref 4.0–10.5)

## 2013-02-13 LAB — CREATININE, SERUM
Creatinine, Ser: 0.66 mg/dL (ref 0.50–1.10)
GFR calc Af Amer: >90 mL/min (ref >90)
GFR calc non Af Amer: >90 mL/min (ref >90)

## 2013-02-13 SURGERY — FUNDOPLICATION, NISSEN, LAPAROSCOPIC
Anesthesia: General | Site: Abdomen | Wound class: Clean

## 2013-02-13 MED ORDER — LACTATED RINGERS IR SOLN
Status: DC | PRN
  Administered 2013-02-13: 1000 mL

## 2013-02-13 MED ORDER — ONDANSETRON HCL 4 MG PO TABS: 4.0000 mg | ORAL_TABLET | Freq: Four times a day (QID) | ORAL | Status: DC | PRN

## 2013-02-13 MED ORDER — PROPOFOL 10 MG/ML IV BOLUS
INTRAVENOUS | Status: DC | PRN
  Administered 2013-02-13: 110 mg via INTRAVENOUS

## 2013-02-13 MED ORDER — CYCLOSPORINE 0.05 % OP EMUL
1.0000 [drp] | Freq: Two times a day (BID) | OPHTHALMIC | Status: DC | PRN
Start: 2013-02-13 — End: 2013-02-15

## 2013-02-13 MED ORDER — LIDOCAINE HCL (CARDIAC) 20 MG/ML IV SOLN
INTRAVENOUS | Status: DC | PRN
  Administered 2013-02-13: 50 mg via INTRAVENOUS

## 2013-02-13 MED ORDER — HEPARIN SODIUM (PORCINE) 5000 UNIT/ML IJ SOLN
5000.0000 [IU] | Freq: Once | INTRAMUSCULAR | Status: AC
  Administered 2013-02-13: 5000 [IU] via SUBCUTANEOUS
  Filled 2013-02-13: qty 1

## 2013-02-13 MED ORDER — CISATRACURIUM BESYLATE (PF) 10 MG/5ML IV SOLN
INTRAVENOUS | Status: DC | PRN
  Administered 2013-02-13 (×5): 3 mg via INTRAVENOUS

## 2013-02-13 MED ORDER — HYDROMORPHONE HCL PF 1 MG/ML IJ SOLN
0.5000 mg | INTRAMUSCULAR | Status: DC | PRN
  Administered 2013-02-13 – 2013-02-14 (×5): 0.5 mg via INTRAVENOUS
  Filled 2013-02-13 (×5): qty 1

## 2013-02-13 MED ORDER — NEOSTIGMINE METHYLSULFATE 1 MG/ML IJ SOLN
INTRAMUSCULAR | Status: DC | PRN
  Administered 2013-02-13: 2.5 mg via INTRAVENOUS

## 2013-02-13 MED ORDER — DIPHENHYDRAMINE HCL 50 MG/ML IJ SOLN
INTRAMUSCULAR | Status: DC | PRN
  Administered 2013-02-13: 25 mg via INTRAVENOUS

## 2013-02-13 MED ORDER — HEPARIN SODIUM (PORCINE) 5000 UNIT/ML IJ SOLN
5000.0000 [IU] | Freq: Three times a day (TID) | INTRAMUSCULAR | Status: DC
  Administered 2013-02-13 – 2013-02-15 (×5): 5000 [IU] via SUBCUTANEOUS
  Filled 2013-02-13 (×8): qty 1

## 2013-02-13 MED ORDER — ALBUTEROL SULFATE HFA 108 (90 BASE) MCG/ACT IN AERS: 2.0000 | INHALATION_SPRAY | RESPIRATORY_TRACT | Status: DC | PRN

## 2013-02-13 MED ORDER — DIPHENHYDRAMINE HCL 50 MG/ML IJ SOLN
INTRAMUSCULAR | Status: AC
  Filled 2013-02-13: qty 1

## 2013-02-13 MED ORDER — DIPHENHYDRAMINE HCL 50 MG/ML IJ SOLN
25.0000 mg | Freq: Once | INTRAMUSCULAR | Status: AC
  Administered 2013-02-13: 25 mg via INTRAVENOUS

## 2013-02-13 MED ORDER — AZELASTINE HCL 0.1 % NA SOLN
1.0000 | Freq: Two times a day (BID) | NASAL | Status: DC
  Administered 2013-02-14 (×2): 1 via NASAL
  Filled 2013-02-13: qty 30

## 2013-02-13 MED ORDER — KCL IN DEXTROSE-NACL 20-5-0.45 MEQ/L-%-% IV SOLN
INTRAVENOUS | Status: DC
  Administered 2013-02-13 – 2013-02-14 (×2): via INTRAVENOUS
  Administered 2013-02-14: 75 mL/h via INTRAVENOUS
  Filled 2013-02-13 (×4): qty 1000

## 2013-02-13 MED ORDER — HYDROMORPHONE HCL PF 1 MG/ML IJ SOLN
INTRAMUSCULAR | Status: AC
  Filled 2013-02-13: qty 1

## 2013-02-13 MED ORDER — MIDAZOLAM HCL 5 MG/5ML IJ SOLN
INTRAMUSCULAR | Status: DC | PRN
  Administered 2013-02-13 (×2): 1 mg via INTRAVENOUS

## 2013-02-13 MED ORDER — NAPHAZOLINE HCL 0.1 % OP SOLN: 1.0000 [drp] | Freq: Four times a day (QID) | OPHTHALMIC | Status: DC | PRN

## 2013-02-13 MED ORDER — GLYCOPYRROLATE 0.2 MG/ML IJ SOLN
INTRAMUSCULAR | Status: DC | PRN
  Administered 2013-02-13: .4 mg via INTRAVENOUS

## 2013-02-13 MED ORDER — ACETAMINOPHEN 10 MG/ML IV SOLN
INTRAVENOUS | Status: AC
  Filled 2013-02-13: qty 100

## 2013-02-13 MED ORDER — HYDROMORPHONE HCL PF 1 MG/ML IJ SOLN
0.2500 mg | INTRAMUSCULAR | Status: DC | PRN
  Administered 2013-02-13 (×4): 0.5 mg via INTRAVENOUS

## 2013-02-13 MED ORDER — BUPIVACAINE LIPOSOME 1.3 % IJ SUSP
20.0000 mL | Freq: Once | INTRAMUSCULAR | Status: DC
  Filled 2013-02-13: qty 20

## 2013-02-13 MED ORDER — DEXTROSE 5 % IV SOLN
2.0000 g | INTRAVENOUS | Status: AC
  Administered 2013-02-13: 2 g via INTRAVENOUS
  Filled 2013-02-13: qty 2

## 2013-02-13 MED ORDER — LACTATED RINGERS IV SOLN
INTRAVENOUS | Status: DC | PRN
  Administered 2013-02-13 (×3): via INTRAVENOUS

## 2013-02-13 MED ORDER — ONDANSETRON HCL 4 MG/2ML IJ SOLN
4.0000 mg | Freq: Four times a day (QID) | INTRAMUSCULAR | Status: DC | PRN
  Administered 2013-02-13: 4 mg via INTRAVENOUS
  Filled 2013-02-13 (×2): qty 2

## 2013-02-13 MED ORDER — HYDROMORPHONE HCL PF 1 MG/ML IJ SOLN
INTRAMUSCULAR | Status: DC | PRN
  Administered 2013-02-13 (×4): 0.5 mg via INTRAVENOUS

## 2013-02-13 MED ORDER — ONDANSETRON HCL 4 MG/2ML IJ SOLN
INTRAMUSCULAR | Status: DC | PRN
  Administered 2013-02-13: 4 mg via INTRAVENOUS

## 2013-02-13 MED ORDER — ACETAMINOPHEN 10 MG/ML IV SOLN
INTRAVENOUS | Status: DC | PRN
  Administered 2013-02-13: 1000 mg via INTRAVENOUS

## 2013-02-13 MED ORDER — DIPHENHYDRAMINE HCL 50 MG/ML IJ SOLN
25.0000 mg | Freq: Four times a day (QID) | INTRAMUSCULAR | Status: DC | PRN
  Administered 2013-02-14 (×4): 25 mg via INTRAVENOUS
  Filled 2013-02-13 (×5): qty 1

## 2013-02-13 MED ORDER — CEFOXITIN SODIUM-DEXTROSE 1-4 GM-% IV SOLR (PREMIX)
INTRAVENOUS | Status: AC
  Filled 2013-02-13: qty 100

## 2013-02-13 MED ORDER — PROMETHAZINE HCL 25 MG/ML IJ SOLN: 6.2500 mg | INTRAMUSCULAR | Status: DC | PRN

## 2013-02-13 MED ORDER — SUFENTANIL CITRATE 50 MCG/ML IV SOLN
INTRAVENOUS | Status: DC | PRN
  Administered 2013-02-13 (×2): 7.5 ug via INTRAVENOUS
  Administered 2013-02-13 (×6): 5 ug via INTRAVENOUS

## 2013-02-13 MED ORDER — SUCCINYLCHOLINE CHLORIDE 20 MG/ML IJ SOLN
INTRAMUSCULAR | Status: DC | PRN
  Administered 2013-02-13: 100 mg via INTRAVENOUS

## 2013-02-13 SURGICAL SUPPLY — 63 items
APL SKNCLS STERI-STRIP NONHPOA (GAUZE/BANDAGES/DRESSINGS)
APPLIER CLIP ROT 10 11.4 M/L (STAPLE)
APR CLP MED LRG 11.4X10 (STAPLE)
BENZOIN TINCTURE PRP APPL 2/3 (GAUZE/BANDAGES/DRESSINGS) ×1 IMPLANT
CABLE HIGH FREQUENCY MONO STRZ (ELECTRODE) IMPLANT
CANISTER SUCTION 2500CC (MISCELLANEOUS) ×1 IMPLANT
CLAMP ENDO BABCK 10MM (STAPLE) IMPLANT
CLIP APPLIE ROT 10 11.4 M/L (STAPLE) IMPLANT
CLOTH BEACON ORANGE TIMEOUT ST (SAFETY) ×2 IMPLANT
COVER SURGICAL LIGHT HANDLE (MISCELLANEOUS) ×1 IMPLANT
DECANTER SPIKE VIAL GLASS SM (MISCELLANEOUS) ×1 IMPLANT
DEVICE SUT QUICK LOAD TK 5 (STAPLE) ×3 IMPLANT
DEVICE SUT TI-KNOT TK 5X26 (MISCELLANEOUS) IMPLANT
DEVICE SUTURE ENDOST 10MM (ENDOMECHANICALS) ×1 IMPLANT
DISSECTOR BLUNT TIP ENDO 5MM (MISCELLANEOUS) ×2 IMPLANT
DRAIN PENROSE .75X.25X12 SILI (WOUND CARE) ×1 IMPLANT
DRAIN PENROSE 18X1/2 LTX STRL (DRAIN) ×1 IMPLANT
DRAPE LAPAROSCOPIC ABDOMINAL (DRAPES) ×2 IMPLANT
ELECT REM PT RETURN 9FT ADLT (ELECTROSURGICAL) ×2
ELECTRODE REM PT RTRN 9FT ADLT (ELECTROSURGICAL) ×1 IMPLANT
FILTER SMOKE EVAC LAPAROSHD (FILTER) IMPLANT
GLOVE BIOGEL M 8.0 STRL (GLOVE) ×1 IMPLANT
GLOVE BIOGEL PI IND STRL 7.0 (GLOVE) IMPLANT
GLOVE BIOGEL PI INDICATOR 7.0 (GLOVE)
GLOVE SURG SS PI 6.0 STRL IVOR (GLOVE) ×4 IMPLANT
GLOVE SURG SS PI 6.5 STRL IVOR (GLOVE) ×1 IMPLANT
GLOVE SURG SS PI 7.0 STRL IVOR (GLOVE) ×3 IMPLANT
GLOVE SURG SS PI 8.0 STRL IVOR (GLOVE) ×3 IMPLANT
GOWN STRL NON-REIN LRG LVL3 (GOWN DISPOSABLE) ×2 IMPLANT
GOWN STRL REIN XL XLG (GOWN DISPOSABLE) ×8 IMPLANT
GRASPER ENDO BABCOCK 10 (MISCELLANEOUS) IMPLANT
GRASPER ENDO BABCOCK 10MM (MISCELLANEOUS)
KIT BASIN OR (CUSTOM PROCEDURE TRAY) ×2 IMPLANT
NS IRRIG 1000ML POUR BTL (IV SOLUTION) ×2 IMPLANT
PENCIL BUTTON HOLSTER BLD 10FT (ELECTRODE) IMPLANT
SCALPEL HARMONIC ACE (MISCELLANEOUS) ×2 IMPLANT
SCISSORS LAP 5X35 DISP (ENDOMECHANICALS) ×2 IMPLANT
SET IRRIG TUBING LAPAROSCOPIC (IRRIGATION / IRRIGATOR) ×2 IMPLANT
SLEEVE ADV FIXATION 5X100MM (TROCAR) IMPLANT
SLEEVE ENDOPATH XCEL 5M (ENDOMECHANICALS) ×1 IMPLANT
SLEEVE XCEL OPT CAN 5 100 (ENDOMECHANICALS) ×4 IMPLANT
SLEEVE Z-THREAD 5X100MM (TROCAR) IMPLANT
SOLUTION ANTI FOG 6CC (MISCELLANEOUS) ×2 IMPLANT
STAPLER VISISTAT 35W (STAPLE) ×2 IMPLANT
STRIP CLOSURE SKIN 1/2X4 (GAUZE/BANDAGES/DRESSINGS) IMPLANT
SUT SURGIDAC NAB ES-9 0 48 120 (SUTURE) ×7 IMPLANT
SUT VIC AB 4-0 SH 18 (SUTURE) ×2 IMPLANT
SYR 30ML LL (SYRINGE) ×2 IMPLANT
TIP INNERVISION DETACH 40FR (MISCELLANEOUS) IMPLANT
TIP INNERVISION DETACH 50FR (MISCELLANEOUS) IMPLANT
TIP INNERVISION DETACH 56FR (MISCELLANEOUS) IMPLANT
TIPS INNERVISION DETACH 40FR (MISCELLANEOUS)
TRAY FOLEY CATH 14FRSI W/METER (CATHETERS) ×1 IMPLANT
TRAY FOLEY METER SIL LF 16FR (CATHETERS) ×1 IMPLANT
TRAY LAP CHOLE (CUSTOM PROCEDURE TRAY) ×2 IMPLANT
TROCAR ADV FIXATION 11X100MM (TROCAR) IMPLANT
TROCAR ADV FIXATION 5X100MM (TROCAR) IMPLANT
TROCAR XCEL BLUNT TIP 100MML (ENDOMECHANICALS) IMPLANT
TROCAR XCEL NON-BLD 11X100MML (ENDOMECHANICALS) IMPLANT
TROCAR Z-THREAD FIOS 11X100 BL (TROCAR) ×2 IMPLANT
TROCAR Z-THREAD FIOS 5X100MM (TROCAR) ×2 IMPLANT
TROCAR Z-THREAD SLEEVE 11X100 (TROCAR) IMPLANT
TUBING FILTER THERMOFLATOR (ELECTROSURGICAL) ×2 IMPLANT

## 2013-02-13 NOTE — Interval H&P Note (Signed)
History and Physical Interval Note:  02/13/2013 8:38 AM  Yesenia Boyd  has presented today for surgery, with the diagnosis of long term dysphagia  The various methods of treatment have been discussed with the patient and family. After consideration of risks, benefits and other options for treatment, the patient has consented to  Procedure(s): Redo LAPAROSCOPIC NISSEN FUNDOPLICATION (N/A) as a surgical intervention .  The patient's history has been reviewed, patient examined, no change in status, stable for surgery.  I have reviewed the patient's chart and labs.  Questions were answered to the patient's satisfaction.     Dimitriy Carreras B

## 2013-02-13 NOTE — Transfer of Care (Signed)
Immediate Anesthesia Transfer of Care Note  Patient: Yesenia Boyd  Procedure(s) Performed: Procedure(s) with comments: Redo LAPAROSCOPIC NISSEN FUNDOPLICATION (N/A) - Forgut explortation with partial  takedown nissan funliplication from 1994 for wrap torsion and persistant dysphagia  Patient Location: PACU  Anesthesia Type:General  Level of Consciousness: alert , oriented and patient cooperative  Airway & Oxygen Therapy: Patient Spontanous Breathing and Patient connected to face mask oxygen  Post-op Assessment: Report given to PACU RN, Post -op Vital signs reviewed and stable and Patient moving all extremities  Post vital signs: Reviewed and stable  Complications: No apparent anesthesia complications

## 2013-02-13 NOTE — Op Note (Signed)
Surgeon: Wenda Low, MD, FACS  Asst:  Fortunato Curling, MD  Anes:  General  Procedure: Laparoscopic dissection of the foregut and takedown of Nissen fundoplication (1994) for suspected wrap torsion and chronic dysphagia  Diagnosis: Nissen wrap torsion with chronic dysphagia  Complications: none  EBL:   30 cc  Description of Procedure:  The patient was taken to room 1 and given general anesthesia. The abdomen was prepped with PCMX and draped sterilely. A timeout was performed and access was achieved to the abdomen using a 5 mm 0 Optiview through the left upper quadrant. The abdomen was insufflated and visualization revealed some adhesions of the colon to the upper midline. Also there were adhesions of the stomach up to the liver which was distorting the pyloric channel. Each of these areas was taken down with sharp dissection with no suspected injuries. This cleared the way for the placement of the trochars on the right side and I ended up with 25 mm on the left side 25 mm on the right side and a 5 mm upper midline for the [facility] retractor.  A proceeded to work my way taking down adhesions of the stomach to the left lateral segment and anteriorly where the wrap was. Numerous sutures and a few staples or clips contributed to the fairly tenacious adhesions felt in this place. Based on when this Nissen was performed laparoscopically and it was performed and the margins will I suspected that the short gastrics were not taken down and a rosette E. Modification of the Nissen was performed. This often occurred and the stomach was brought around and it led to a torsion of the wrap and dysphagia. I told her we would try to improve her dysphagia she may have some reflux has resolved.  I spent a total of 4 hours doing sharp dissection with scissors or the harmonic scalpel to eventually divide the sutures holding the wrap in place and in taking down the wrap from the patient's left side. Down numerous  adhesions to the spleen where the short gastrics had pulled around it had never been divided. Work along the left side all the way over to the right side and freed the wrap to where it went from began about 360 wrap to being about 270 wrap.  With the degree of inflammation I did not feel it completely taking her down and tried retractor per was the best procedure. I performed endoscopy and felt like I had relaxed her enough and there was no evidence of any perforations.  I retroflexed and could see the wrap which was now partial and not a complete 360. Everything else seemed to be in order. EG junction seemed to be below the diaphragm She seemed to tolerate the procedure well and at the conclusion a went ahead and infiltrated all of the incisions with Exparel. Skin was closed with Dermabond  Matt B. Daphine Deutscher, MD, [facility], [place] [phone]

## 2013-02-13 NOTE — H&P (View-Only) (Signed)
Chief Complaint:  Dysphagia with unsuccessful esophageal dilatations  History of Present Illness:  Yesenia Boyd is an 57 y.o. female referred by Dr. Reginald Cathey for Prolonged dysphagia. She had an upper GI series dated 01/04/2013 which I reviewed on the DVD. This was done at [facility].  She has a very long narrowed area consistent with a very tight wrap. She has been dilated unsuccessfully before.  In discussing her surgical history this dysphasia occurred immediately postop and never resolved in the usual 4-6 weeks after a laparoscopic Nissen fundoplication. At that time she was referred to [facility] and [facility] And both were unable to help her. At the time of this procedure there was no harmonic scalpel available. Oftentimes wraps were performed with an adequate mobilization of the proximal stomach and this resulted in a twisting of the wrap that caused this to be very tight although on endoscopy this twisting is not apparent. The patient may well benefit from a redo laparoscopic procedure where the old wrap was taken down if possible and then she is rewrapped over a 56 or 60 dilator.  At the present time I will obtain an esophageal manometry to assess her motility and will plan to see her back after that. It is likely that we will go ahead and discuss redo lap Nissen possible open Nissen with her and proceed.  Past Medical History  Diagnosis Date  . IBS (irritable bowel syndrome)   . S/P endoscopy 2008, Aug 2010, Sept 2012    Dr. Anwar 2008: removal of impacted food bolus, Dr. Anwar 2010: moderate gastritis, Sept 2012 with SLF: path with mild gastritis, no definite stricture noted, dilation with Svary 16 mm  . S/P colonoscopy April 2009, Sept 2012    normal Dr. Anwar, internal hemorrhoids, repeat in Sept 2022  . Fibromyalgia     sees Dr Sheen at [facility]  . Peripheral neuropathy   . Myasthenia gravis   . Numbness and tingling in right hand     and whole left side of body-pt sts she has "spells  to where she cannot walk or talk"    Past Surgical History  Procedure Laterality Date  . Nissen fundoplication    . Cholecystectomy    . Abdominal hysterectomy    . Esophageal surgery?      2008 [facility]  . Hand surgery      carpal tumnnel right and removal of cyst left  . Neck surgery      removal of "knot"t from neck  . Bladder surgery      stretch bladder opening  . Savory dilation  07/18/2011    Procedure: SAVORY DILATION;  Surgeon: Sandi M Fields, MD;  Location: [facility];  Service: Endoscopy;  Laterality: N/A;  with propofol sedation ; #16 savory dilation  . Esophageal manometry N/A 01/21/2013    Procedure: ESOPHAGEAL MANOMETRY (EM);  Surgeon: Melaina Howerton B Roald Lukacs, MD;  Location: [facility];  Service: General;  Laterality: N/A;    Current Outpatient Prescriptions  Medication Sig Dispense Refill  . acetaminophen (TYLENOL) 650 MG CR tablet Take 650 mg by mouth every 8 (eight) hours as needed.        . azelastine (ASTELIN) 137 MCG/SPRAY nasal spray Place 1 spray into the nose 2 (two) times daily. Use in each nostril as directed       . azelastine (OPTIVAR) 0.05 % ophthalmic solution 1 drop 2 (two) times daily.       . CARAFATE 1 GM/10ML suspension       .   Cholecalciferol (CVS VIT D 5000 HIGH-POTENCY) 5000 UNITS capsule Take 5,000 Units by mouth daily.        . cycloSPORINE (RESTASIS) 0.05 % ophthalmic emulsion Place 1 drop into both eyes 2 (two) times daily.       . ergocalciferol (VITAMIN D2) 50000 UNITS capsule Take 50,000 Units by mouth once a week.        . esomeprazole (NEXIUM) 40 MG capsule Take 40 mg by mouth 2 (two) times daily.       . estrogens, conjugated, (PREMARIN) 0.625 MG tablet Take 0.625 mg by mouth daily. Take daily for 21 days then do not take for 7 days.       . hydrOXYzine (VISTARIL) 25 MG capsule       . L-Methylfolate-B6-B12 (METANX PO) Take by mouth daily.        . levocetirizine (XYZAL) 5 MG tablet Take 5 mg by mouth every evening.        . Linaclotide (LINZESS)  290 MCG CAPS Take 1 capsule by mouth daily. 30 minutes before first meal of the day  30 capsule  0  . lubiprostone ([name]) 24 MCG capsule Take 24 mcg by mouth 2 (two) times daily with a meal.        . magnesium citrate solution Take 296 mLs by mouth daily as needed. For blockage      . Olopatadine HCl (PATADAY) 0.2 % SOLN Apply to eye.        . simethicone (GAS-X) 80 MG chewable tablet Chew 80 mg by mouth every 6 (six) hours as needed. For gas      . tiZANidine (ZANAFLEX) 4 MG tablet       . triamcinolone (KENALOG) 0.1 % cream Apply 1 application topically 2 (two) times daily as needed. For skin       . UNABLE TO FIND Med Name: triamicinolone acetonide cream 0.1% prn        . UNABLE TO FIND Apply 1 application topically daily. Med Name: LCD10% sal acid 2 % traim. Use as directed      . UNKNOWN TO PATIENT OXYGEN      2L        At bed time instead of C-Pap for sleep apnea.        No current facility-administered medications for this visit.   Avelox; Cephalexin; Codeine; Cymbalta; Dicyclomine; Duloxetine; Macrolides and ketolides; Meperidine hcl; Metronidazole; Morphine; Penicillins; Prednisone; Sulfonamide derivatives; Tetracycline; and Tramadol hcl Family History  Problem Relation Age of Onset  . Colon cancer Cousin     4 cousins  . Anesthesia problems Sister   . Cancer Sister     breast  . Hypertension Sister   . Pseudochol deficiency Neg Hx   . Hypotension Neg Hx   . Malignant hyperthermia Neg Hx   . Parkinson's disease Mother   . Kidney disease Mother   . Diabetes Mother   . Heart attack Father   . Cancer Father     stomach cancer  . Hypertension Brother   . Diabetes Brother   . Arthritis Brother   . Kidney disease Brother   . Parkinson's disease Brother   . Cancer Maternal Aunt     breast  . Cancer Maternal Grandmother     breast   Social History:   reports that she has never smoked. She does not have any smokeless tobacco history on file. She reports that  drinks  alcohol. She reports that she does not use illicit drugs.     REVIEW OF SYSTEMS - PERTINENT POSITIVES ONLY: Positive for marked weight loss after her procedure. Her asthma could be related to reflux and she has now based more on pain too tight.  Physical Exam:   Blood pressure 110/78, pulse 74, resp. rate 18, height 5' 2" (1.575 m), weight 144 lb (65.318 kg). Body mass index is 26.33 kg/(m^2).  Gen:  WDWN AAF NAD  Neurological: Alert and oriented to person, place, and time. Motor and sensory function is grossly intact  Head: Normocephalic and atraumatic.  Eyes: Conjunctivae are normal. Pupils are equal, round, and reactive to light. No scleral icterus.  Neck: Normal range of motion. Neck supple. No tracheal deviation or thyromegaly present.  Cardiovascular:  SR without murmurs or gallops.  No carotid bruits Respiratory: Effort normal.  No respiratory distress. No chest wall tenderness. Breath sounds normal.  No wheezes, rales or rhonchi.  Abdomen:  Well healed incisions from prior laparoscopic Nissen fundoplication GU: Musculoskeletal: Normal range of motion. Extremities are nontender. No cyanosis, edema or clubbing noted Lymphadenopathy: No cervical, preauricular, postauricular or axillary adenopathy is present Skin: Skin is warm and dry. No rash noted. No diaphoresis. No erythema. No pallor. Pscyh: Normal mood and affect. Behavior is normal. Judgment and thought content normal.   LABORATORY RESULTS: No results found for this or any previous visit (from the past 48 hour(s)).  RADIOLOGY RESULTS: No results found.  Problem List: Patient Active Problem List  Diagnosis  . PERIPHERAL NEUROPATHY, FEET  . GERD  . IRRITABLE BOWEL, PREDOMINANTLY CONSTIPATION  . RECTAL PAIN  . FIBROMYALGIA  . NEONATAL MYASTHENIA GRAVIS  . DYSPHAGIA UNSPECIFIED  . ALLERGY, HX OF  . Rectal bleeding  . Abdominal  pain, other specified site  . Lap Nissen 1993 [place], [place] with postop prolonged  dysphagia    Assessment & Plan: Rosetti modification of Nissen with twisting of wrap and stricture.  Plan motility study and then probable takedown and redo lap Nissen. Manometry prelim report reviewed over the phone--distal obstruction with weakened peristasis She brought in endo pics from 2000 [place] and these appear to show twisting of the wrap. Plan lap/open take down and redo Nissen if possible.  She is aware of the risk and benefits.     Matt B. Zavier Canela, MD, FACS  [facility] [phone] beeper [phone]  01/22/2013 3:28 PM     

## 2013-02-13 NOTE — Anesthesia Postprocedure Evaluation (Signed)
  Anesthesia Post-op Note  Patient: Yesenia Boyd  Procedure(s) Performed: Procedure(s) (LRB): Redo LAPAROSCOPIC NISSEN FUNDOPLICATION (N/A)  Patient Location: PACU  Anesthesia Type: General  Level of Consciousness: awake and alert   Airway and Oxygen Therapy: Patient Spontanous Breathing  Post-op Pain: mild  Post-op Assessment: Post-op Vital signs reviewed, Patient's Cardiovascular Status Stable, Respiratory Function Stable, Patent Airway and No signs of Nausea or vomiting  Last Vitals:  Filed Vitals:   02/13/13 1315  BP: 129/73  Pulse: 59  Temp:   Resp: 8    Post-op Vital Signs: stable   Complications: No apparent anesthesia complications. Pruritis treated. Sleep apnea precautions on floor.

## 2013-02-13 NOTE — Anesthesia Preprocedure Evaluation (Addendum)
Anesthesia Evaluation  Patient identified by MRN, date of birth, ID band Patient awake    Reviewed: Allergy & Precautions, H&P , NPO status , Patient's Chart, lab work & pertinent test results  Airway Mallampati: II TM Distance: >3 FB Neck ROM: Full    Dental no notable dental hx.    Pulmonary sleep apnea ,  breath sounds clear to auscultation  Pulmonary exam normal       Cardiovascular Exercise Tolerance: Good negative cardio ROS  Rhythm:Regular Rate:Normal  CXR and ECG reviewed.   Neuro/Psych Myasthenia Gravis. No medicines for MG currently, but does sometimes have symptoms of weakness, difficulty swallowing.  Neuromuscular disease negative psych ROS   GI/Hepatic Neg liver ROS, GERD-  Medicated,  Endo/Other  negative endocrine ROS  Renal/GU negative Renal ROS  negative genitourinary   Musculoskeletal  (+) Fibromyalgia -  Abdominal   Peds negative pediatric ROS (+)  Hematology negative hematology ROS (+)   Anesthesia Other Findings   Reproductive/Obstetrics negative OB ROS                         Anesthesia Physical Anesthesia Plan  ASA: III  Anesthesia Plan: General   Post-op Pain Management:    Induction: Intravenous  Airway Management Planned: Oral ETT  Additional Equipment:   Intra-op Plan:   Post-operative Plan: Extubation in OR  Informed Consent: I have reviewed the patients History and Physical, chart, labs and discussed the procedure including the risks, benefits and alternatives for the proposed anesthesia with the patient or authorized representative who has indicated his/her understanding and acceptance.   Dental advisory given  Plan Discussed with: CRNA  Anesthesia Plan Comments: (Myasthenia precautions and sleep apnea precautions.)       Anesthesia Quick Evaluation

## 2013-02-14 ENCOUNTER — Observation Stay (HOSPITAL_COMMUNITY): Payer: BC Managed Care – PPO

## 2013-02-14 ENCOUNTER — Encounter (HOSPITAL_COMMUNITY): Payer: Self-pay | Admitting: Surgery

## 2013-02-14 DIAGNOSIS — K219 Gastro-esophageal reflux disease without esophagitis: Secondary | ICD-10-CM | POA: Diagnosis not present

## 2013-02-14 DIAGNOSIS — G609 Hereditary and idiopathic neuropathy, unspecified: Secondary | ICD-10-CM | POA: Diagnosis not present

## 2013-02-14 DIAGNOSIS — Z9889 Other specified postprocedural states: Secondary | ICD-10-CM | POA: Diagnosis not present

## 2013-02-14 DIAGNOSIS — R1310 Dysphagia, unspecified: Secondary | ICD-10-CM | POA: Diagnosis not present

## 2013-02-14 DIAGNOSIS — IMO0001 Reserved for inherently not codable concepts without codable children: Secondary | ICD-10-CM | POA: Diagnosis not present

## 2013-02-14 DIAGNOSIS — K929 Disease of digestive system, unspecified: Secondary | ICD-10-CM | POA: Diagnosis not present

## 2013-02-14 DIAGNOSIS — G7000 Myasthenia gravis without (acute) exacerbation: Secondary | ICD-10-CM | POA: Diagnosis not present

## 2013-02-14 LAB — CBC
HCT: 28.3 % — ABNORMAL LOW (ref 36.0–46.0)
Hemoglobin: 10.1 g/dL — ABNORMAL LOW (ref 12.0–15.0)
MCH: 27.8 pg (ref 26.0–34.0)
MCHC: 35.7 g/dL (ref 30.0–36.0)
MCV: 78 fL (ref 78.0–100.0)
Platelets: 118 10*3/uL — ABNORMAL LOW (ref 150–400)
RBC: 3.63 MIL/uL — ABNORMAL LOW (ref 3.87–5.11)
RDW: 12.6 % (ref 11.5–15.5)
WBC: 6.5 10*3/uL (ref 4.0–10.5)

## 2013-02-14 LAB — BASIC METABOLIC PANEL
BUN: 6 mg/dL (ref 6–23)
CO2: 29 mEq/L (ref 19–32)
Calcium: 8.6 mg/dL (ref 8.4–10.5)
Chloride: 101 mEq/L (ref 96–112)
Creatinine, Ser: 0.74 mg/dL (ref 0.50–1.10)
GFR calc Af Amer: >90 mL/min (ref >90)
GFR calc non Af Amer: >90 mL/min (ref >90)
Glucose, Bld: 109 mg/dL — ABNORMAL HIGH (ref 70–99)
Potassium: 3.2 mEq/L — ABNORMAL LOW (ref 3.5–5.1)
Sodium: 137 mEq/L (ref 135–145)

## 2013-02-14 MED ORDER — IOHEXOL 300 MG/ML  SOLN
50.0000 mL | Freq: Once | INTRAMUSCULAR | Status: AC | PRN
  Administered 2013-02-14: 50 mL via ORAL

## 2013-02-14 NOTE — Progress Notes (Signed)
Patient ID: LADY WISHAM, female   DOB: December 20, 1955, 57 y.o.   MRN: [id number] [facility] Progress Note:   1 Day Post-Op  Subjective: Mental status is clear Objective: Vital signs in last 24 hours: Temp:  [97.5 F (36.4 C)-98.3 F (36.8 C)] 98.2 F (36.8 C) (04/10 0630) Pulse Rate:  [59-87] 81 (04/10 0630) Resp:  [13-26] 18 (04/10 0630) BP: (108-135)/(65-82) 108/67 mmHg (04/10 0630) SpO2:  [96 %-100 %] 100 % (04/10 0630) Weight:  [143 lb (64.864 kg)] 143 lb (64.864 kg) (04/09 1710)  Intake/Output from previous day: 04/09 0701 - 04/10 0700 In: 3958.8 [I.V.:3958.8] Out: 2315 [Urine:2215; Blood:100] Intake/Output this shift:    Physical Exam: Work of breathing is normal.  Not much abdominal pain.  Will get UGI today  Lab Results:  Results for orders placed during the hospital encounter of 02/13/13 (from the past 48 hour(s))  CBC     Status: Abnormal   Collection Time    02/13/13  3:21 PM      Result Value Range   WBC 8.5  4.0 - 10.5 K/uL   RBC 3.92  3.87 - 5.11 MIL/uL   Hemoglobin 10.6 (*) 12.0 - 15.0 g/dL   HCT 40.9 (*) 81.1 - 91.4 %   MCV 78.6  78.0 - 100.0 fL   MCH 27.0  26.0 - 34.0 pg   MCHC 34.4  30.0 - 36.0 g/dL   RDW 78.2  95.6 - 21.3 %   Platelets 184  150 - 400 K/uL  CREATININE, SERUM     Status: None   Collection Time    02/13/13  3:21 PM      Result Value Range   Creatinine, Ser 0.66  0.50 - 1.10 mg/dL   GFR calc non Af Amer >90  >90 mL/min   GFR calc Af Amer >90  >90 mL/min   Comment:            The eGFR has been calculated     using the CKD EPI equation.     This calculation has not been     validated in all clinical     situations.     eGFR's persistently     <90 mL/min signify     possible Chronic Kidney Disease.  CBC     Status: Abnormal   Collection Time    02/14/13  4:30 AM      Result Value Range   WBC 6.5  4.0 - 10.5 K/uL   RBC 3.63 (*) 3.87 - 5.11 MIL/uL   Hemoglobin 10.1 (*) 12.0 - 15.0 g/dL   HCT 08.6 (*) 57.8 - 46.9 %   MCV 78.0  78.0 - 100.0 fL   MCH 27.8  26.0 - 34.0 pg   MCHC 35.7  30.0 - 36.0 g/dL   RDW 62.9  52.8 - 41.3 %   Platelets 118 (*) 150 - 400 K/uL   Comment: SPECIMEN CHECKED FOR CLOTS     REPEATED TO VERIFY     DELTA CHECK NOTED     PLATELET COUNT CONFIRMED BY SMEAR  BASIC METABOLIC PANEL     Status: Abnormal   Collection Time    02/14/13  4:30 AM      Result Value Range   Sodium 137  135 - 145 mEq/L   Potassium 3.2 (*) 3.5 - 5.1 mEq/L   Chloride 101  96 - 112 mEq/L   CO2 29  19 - 32 mEq/L   Glucose, Bld 109 (*)  70 - 99 mg/dL   BUN 6  6 - 23 mg/dL   Creatinine, Ser 1.61  0.50 - 1.10 mg/dL   Calcium 8.6  8.4 - 09.6 mg/dL   GFR calc non Af Amer >90  >90 mL/min   GFR calc Af Amer >90  >90 mL/min   Comment:            The eGFR has been calculated     using the CKD EPI equation.     This calculation has not been     validated in all clinical     situations.     eGFR's persistently     <90 mL/min signify     possible Chronic Kidney Disease.    Radiology/Results: No results found.  Anti-infectives: Anti-infectives   Start     Dose/Rate Route Frequency Ordered Stop   02/13/13 [phone]  cefOXitin (MEFOXIN) 2 g in dextrose 5 % 50 mL IVPB     2 g 100 mL/hr over 30 Minutes Intravenous On call to O.R. 02/13/13 0633 02/13/13 0845      Assessment/Plan: Problem List: Patient Active Problem List  Diagnosis  . PERIPHERAL NEUROPATHY, FEET  . GERD  . IRRITABLE BOWEL, PREDOMINANTLY CONSTIPATION  . RECTAL PAIN  . FIBROMYALGIA  . NEONATAL MYASTHENIA GRAVIS  . DYSPHAGIA UNSPECIFIED  . ALLERGY, HX OF  . Rectal bleeding  . Abdominal  pain, other specified site  . Lap Nissen 1993 [place], [place] with postop prolonged dysphagia    Will get UGI this am.  Hopefully will start clears after that.  1 Day Post-Op    LOS: 1 day   Matt B. Daphine Deutscher, MD, [facility] [phone] beeper [phone]  02/14/2013 8:32 AM

## 2013-02-14 NOTE — Care Management Note (Signed)
    Page 1 of 1   02/14/2013     10:04:06 AM   CARE MANAGEMENT NOTE 02/14/2013  Patient:  Yesenia Boyd, Yesenia Boyd   Account Number:  [id number]  Date Initiated:  02/14/2013  Documentation initiated by:  Lorenda Ishihara  Subjective/Objective Assessment:   57 yo female admitted s/p lap dissection of forgut and takedown of Nissen Fundoplication. PTA lived at home with spouse.     Action/Plan:   Home when stable   Anticipated DC Date:  02/15/2013   Anticipated DC Plan:  HOME/SELF CARE      DC Planning Services  CM consult      Choice offered to / List presented to:             Status of service:  Completed, signed off Medicare Important Message given?   (If response is "NO", the following Medicare IM given date fields will be blank) Date Medicare IM given:   Date Additional Medicare IM given:    Discharge Disposition:  HOME/SELF CARE  Per UR Regulation:  Reviewed for med. necessity/level of care/duration of stay  If discussed at Long Length of Stay Meetings, dates discussed:    Comments:

## 2013-02-14 NOTE — Progress Notes (Signed)
Spoke with pharmacy regarding medication of Zylox 5 mg that patient takes every morning, Zylox is not available and patient currently does not have a supply available, pharmacy offered that Zyrtec 5 mg be substituted but patient did not want the Zyrtec Stanford Breed RN 02-14-2013

## 2013-02-15 DIAGNOSIS — IMO0001 Reserved for inherently not codable concepts without codable children: Secondary | ICD-10-CM | POA: Diagnosis not present

## 2013-02-15 DIAGNOSIS — R1310 Dysphagia, unspecified: Secondary | ICD-10-CM | POA: Diagnosis not present

## 2013-02-15 DIAGNOSIS — G7000 Myasthenia gravis without (acute) exacerbation: Secondary | ICD-10-CM | POA: Diagnosis not present

## 2013-02-15 DIAGNOSIS — K219 Gastro-esophageal reflux disease without esophagitis: Secondary | ICD-10-CM | POA: Diagnosis not present

## 2013-02-15 DIAGNOSIS — K929 Disease of digestive system, unspecified: Secondary | ICD-10-CM | POA: Diagnosis not present

## 2013-02-15 DIAGNOSIS — G609 Hereditary and idiopathic neuropathy, unspecified: Secondary | ICD-10-CM | POA: Diagnosis not present

## 2013-02-15 LAB — BASIC METABOLIC PANEL
BUN: <3 mg/dL — ABNORMAL LOW (ref 6–23)
CO2: 30 mEq/L (ref 19–32)
Calcium: 8.6 mg/dL (ref 8.4–10.5)
Chloride: 102 mEq/L (ref 96–112)
Creatinine, Ser: 0.69 mg/dL (ref 0.50–1.10)
GFR calc Af Amer: >90 mL/min (ref >90)
GFR calc non Af Amer: >90 mL/min (ref >90)
Glucose, Bld: 117 mg/dL — ABNORMAL HIGH (ref 70–99)
Potassium: 3.2 mEq/L — ABNORMAL LOW (ref 3.5–5.1)
Sodium: 138 mEq/L (ref 135–145)

## 2013-02-15 LAB — CBC
HCT: 28 % — ABNORMAL LOW (ref 36.0–46.0)
Hemoglobin: 9.9 g/dL — ABNORMAL LOW (ref 12.0–15.0)
MCH: 27.9 pg (ref 26.0–34.0)
MCHC: 35.4 g/dL (ref 30.0–36.0)
MCV: 78.9 fL (ref 78.0–100.0)
Platelets: 93 10*3/uL — ABNORMAL LOW (ref 150–400)
RBC: 3.55 MIL/uL — ABNORMAL LOW (ref 3.87–5.11)
RDW: 12.7 % (ref 11.5–15.5)
WBC: 5.9 10*3/uL (ref 4.0–10.5)

## 2013-02-15 NOTE — Discharge Summary (Signed)
Physician Discharge Summary  Patient ID: Yesenia Boyd MRN: [id number] DOB/AGE: 1955-11-10 57 y.o.  Admit date: 02/13/2013 Discharge date: 02/15/2013  Admission Diagnoses:  Dysphagia secondary to prior Nissen dating back to 1994  Discharge Diagnoses:  Same post partial takedown of Nissen  Active Problems:   * No active hospital problems. *   Surgery:  Laparoscopic takedown of Nissen wrap and endoscopy  Discharged Condition: improved  Hospital Course:   Patient had 4 hour surgery.  Admitted and UGI obtained on day one.  Started on liquids and discharged on PD 2  Consults: none  Significant Diagnostic Studies: UGI    Discharge Exam: Blood pressure 130/77, pulse 72, temperature 99 F (37.2 C), temperature source Oral, resp. rate 16, height 5\' 3"  (1.6 m), weight 143 lb (64.864 kg), SpO2 98.00%. Minimal pain.  Dermbond on incisions  Disposition: 01-Home or Self Care  Discharge Orders   Future Appointments Provider Department Dept Phone   02/28/2013 8:45 AM Valarie Merino, MD [facility] [place] [phone]   03/13/2013 2:00 PM West Bali, MD [facility] [phone]   Future Orders Complete By Expires     Diet - low sodium heart healthy  As directed     Increase activity slowly  As directed     No wound care  As directed         Medication List    TAKE these medications       acetaminophen 650 MG CR tablet  Commonly known as:  TYLENOL  Take 650 mg by mouth every 8 (eight) hours as needed for pain.     albuterol 108 (90 BASE) MCG/ACT inhaler  Commonly known as:  PROVENTIL HFA;VENTOLIN HFA  Inhale 2 puffs into the lungs every 4 (four) hours as needed for shortness of breath.     azelastine 137 MCG/SPRAY nasal spray  Commonly known as:  ASTELIN  Place 1 spray into the nose 2 (two) times daily.     CARAFATE 1 GM/10ML suspension  Generic drug:  sucralfate  Take 1 g by mouth 4 (four) times daily.     CVS VIT D 5000  HIGH-POTENCY 5000 UNITS capsule  Generic drug:  Cholecalciferol  Take 5,000 Units by mouth every morning.     cycloSPORINE 0.05 % ophthalmic emulsion  Commonly known as:  RESTASIS  Place 1 drop into both eyes 2 (two) times daily as needed.     esomeprazole 40 MG capsule  Commonly known as:  NEXIUM  Take 40 mg by mouth 2 (two) times daily.     estrogens (conjugated) 0.625 MG tablet  Commonly known as:  PREMARIN  Take 0.625 mg by mouth every morning. Take daily for 21 days then do not take for 7 days.     GAS-X 80 MG chewable tablet  Generic drug:  simethicone  Chew 80 mg by mouth every 6 (six) hours as needed. For gas     hydrOXYzine 25 MG capsule  Commonly known as:  VISTARIL  Take 25 mg by mouth at bedtime.     levocetirizine 5 MG tablet  Commonly known as:  XYZAL  Take 5 mg by mouth every morning.     lubiprostone 24 MCG capsule  Commonly known as:  [name]  Take 24 mcg by mouth 2 (two) times daily with a meal.     magnesium citrate solution  Take 296 mLs by mouth daily as needed. For blockage     METANX PO  Take 1 tablet  by mouth at bedtime.     PATADAY 0.2 % Soln  Generic drug:  Olopatadine HCl  Place 1 drop into both eyes daily as needed (For allergies.).     PRESCRIPTION MEDICATION  Apply 1 application topically every morning. LCD10% with Salicylic Acid 2%. Use as directed for skin and rashes.     tetrahydrozoline 0.05 % ophthalmic solution  Place 1 drop into both eyes 4 (four) times daily as needed (For eye irritation.).     tiZANidine 4 MG tablet  Commonly known as:  ZANAFLEX  Take 4 mg by mouth at bedtime.     triamcinolone cream 0.1 %  Commonly known as:  KENALOG  Apply 1 application topically 2 (two) times daily as needed. For skin.           Follow-up Information   Follow up with Luretha Murphy B, MD In 3 weeks.   Contact information:   [address] [place] [address] [phone]       Signed: Valarie Merino 02/15/2013, 8:44 AM

## 2013-02-19 ENCOUNTER — Telehealth (INDEPENDENT_AMBULATORY_CARE_PROVIDER_SITE_OTHER): Payer: Self-pay

## 2013-02-19 DIAGNOSIS — R0989 Other specified symptoms and signs involving the circulatory and respiratory systems: Secondary | ICD-10-CM | POA: Diagnosis not present

## 2013-02-19 NOTE — Telephone Encounter (Signed)
Pt called our office c/o having trouble breathing since she has been home after surgery on 02/13/13 of Lap Nissen redo by Dr Daphine Deutscher. The pt states that she had asthma a long time a go and has not been treated for this in a long time. The pt has an emergency albuterol inhaler that she has been trying to use but not getting any relief with the inhaler. I asked the pt if she felt like something was sitting on her chest while trying to breathe but she stated no she doesn't have any pain. The pt has a little discomfort from the surgery last week. I asked the pt if she called her PCP but she hasn't she called [place] first. I will call Dr Daphine Deutscher first but I think she needs to be seen by her PCP.

## 2013-02-19 NOTE — Telephone Encounter (Signed)
Called pt back to let her know that I did speak to Dr Daphine Deutscher and he advises pt to be seen by her PCP today. I will call her PCP to make an appt for the pt.  I called Dr Bartholomew Crews office to see about getting the pt seen today b/c of the breathing issue. They will work pt in today at 4:30 to see Dr Olena Leatherwood. I called the pt to let her know that I did get her worked in today but the pt is now stating that she doesn't have a ride. I advised her that she needed to be seen today that she really needed to find a ride to take her to the appt. The pt said she would try.

## 2013-02-26 ENCOUNTER — Ambulatory Visit (INDEPENDENT_AMBULATORY_CARE_PROVIDER_SITE_OTHER): Payer: BC Managed Care – PPO | Admitting: Psychology

## 2013-02-26 DIAGNOSIS — F339 Major depressive disorder, recurrent, unspecified: Secondary | ICD-10-CM

## 2013-02-26 DIAGNOSIS — F419 Anxiety disorder, unspecified: Secondary | ICD-10-CM

## 2013-02-26 DIAGNOSIS — F411 Generalized anxiety disorder: Secondary | ICD-10-CM

## 2013-02-28 ENCOUNTER — Ambulatory Visit (INDEPENDENT_AMBULATORY_CARE_PROVIDER_SITE_OTHER): Payer: BC Managed Care – PPO | Admitting: Surgery

## 2013-02-28 VITALS — BP 98/62 | HR 60 | Temp 98.0°F | Resp 18 | Ht 63.0 in | Wt 141.0 lb

## 2013-02-28 DIAGNOSIS — Z9889 Other specified postprocedural states: Secondary | ICD-10-CM

## 2013-02-28 DIAGNOSIS — Z09 Encounter for follow-up examination after completed treatment for conditions other than malignant neoplasm: Secondary | ICD-10-CM

## 2013-02-28 NOTE — Patient Instructions (Signed)
Gradually advance diet

## 2013-02-28 NOTE — Progress Notes (Signed)
Followup visit after surgery. She is able to swallow easier and the discomfort that she was having her shoulders is now on both sides rather and concentrated on her right side. She is a complicated lady with a lot of allergies. I think that she should be able to gradually advance her diet and hopefully will get back toward some normalcy.  I did return the CD of her upper GI and the pictures of her endoscopy.  Return 2 months

## 2013-03-05 ENCOUNTER — Telehealth (INDEPENDENT_AMBULATORY_CARE_PROVIDER_SITE_OTHER): Payer: Self-pay | Admitting: *Deleted

## 2013-03-05 ENCOUNTER — Ambulatory Visit (HOSPITAL_COMMUNITY): Payer: Self-pay | Admitting: Psychology

## 2013-03-05 NOTE — Telephone Encounter (Signed)
Patient called to state that she is having some GERD and trouble getting food down.  Patient encouraged to go back to a liquid bland diet for the next 1-2 days then try again with foods.  Patient is also following up with her PMD regarding her drainage and sinus issues.  Patient states understanding and agreeable at this time.

## 2013-03-07 DIAGNOSIS — J301 Allergic rhinitis due to pollen: Secondary | ICD-10-CM | POA: Diagnosis not present

## 2013-03-13 ENCOUNTER — Encounter: Payer: Self-pay | Admitting: Gastroenterology

## 2013-03-13 ENCOUNTER — Ambulatory Visit (INDEPENDENT_AMBULATORY_CARE_PROVIDER_SITE_OTHER): Payer: BC Managed Care – PPO | Admitting: Gastroenterology

## 2013-03-13 VITALS — BP 118/77 | HR 68 | Temp 97.6°F | Ht 63.0 in | Wt 142.2 lb

## 2013-03-13 DIAGNOSIS — K219 Gastro-esophageal reflux disease without esophagitis: Secondary | ICD-10-CM

## 2013-03-13 DIAGNOSIS — K589 Irritable bowel syndrome without diarrhea: Secondary | ICD-10-CM

## 2013-03-13 DIAGNOSIS — R1310 Dysphagia, unspecified: Secondary | ICD-10-CM | POA: Diagnosis not present

## 2013-03-13 NOTE — Progress Notes (Signed)
Reminder in epic °

## 2013-03-13 NOTE — Assessment & Plan Note (Signed)
SX IMPROVED. TOLERATING [name].  CONTINUE [name] OPV IN 6 MOS

## 2013-03-13 NOTE — Assessment & Plan Note (Signed)
IMPROVED AFTER RE-DO NISSEN. PT HAS MILD ESO MOT D/O AND REGURGITATION/REFLUX  1. PUT THE HEAD OF YOUR BED ON 6 INCH BLOCKS.  2. CONTINUE NEXIUM. TAKE 30 MINUTES BEFORE MEALS TWICE DAILY.  3. FOLLOW FULL LIQUID DIET FOR 3 DAYS WITH FLARES OF PAIN, NAUSEA, OR VOMITING THEN FOLLOW A LOW FAT/SOFT MECHANICAL DIET.   4. EAT 4 TO 6 SMALL MEALS DAILY.  5. STAY UPRIGHT 30 MINUTES AFTER EATING. WAIT 3 TO 4 HOURS BEFORE LAYING DOWN AFTER EATING.  6. FOLLOW UP IN 6 MOS

## 2013-03-13 NOTE — Progress Notes (Signed)
Cc PCP 

## 2013-03-13 NOTE — Patient Instructions (Signed)
CONTINUE [name] AND AS NEEDED CITRATE TO HELP WITH CONSTIPATION.  TO HELP CONTROL YOUR REGURGITATION AND REFLUX- 1. PUT THE HEAD OF YOUR BED ON 6 INCH BLOCKS.  2. CONTINUE NEXIUM. TAKE 30 MINUTES BEFORE MEALS TWICE DAILY.  3. FOLLOW FULL LIQUID DIET FOR 3 DAYS WITH FLARES OF PAIN, NAUSEA, REGURGITATION, OR VOMITING THEN FOLLOW A LOW FAT/SOFT MECHANICAL DIET.   4. EAT 4 TO 6 SMALL MEALS DAILY.  5. STAY UPRIGHT 30 MINUTES AFTER EATING. WAIT 3 TO 4 HOURS BEFORE LAYING DOWN AFTER EATING.  6. FOLLOW UP IN 6 MOS

## 2013-03-13 NOTE — Progress Notes (Signed)
Subjective:    Patient ID: Yesenia Boyd, female    DOB: [birth year], 57 y.o.   MRN: [id number]  PCP: HASANAJ  HPI HAD RE-DO NISSEN APR 2014. HAVING PROBLEMS SWALLOWING. PAIN IN SHOULDER IS LESS. CAN HAVE TROUBLE PROBLEM SWALLOWING BREAD. HAVING PROBLEMS WITH ALLERGIES AND REFLUX. WORKING ON DIET MODIFICATION. CAN VOMIT A & CRAMPS IN LEGS AFTER EATING. BMs: EVERY 2-3 DAYS. MILD TO MODERATE  RUQ ABD PAIN. HAS JUST REGURGITATION AND BURNING IN MIDDLE PART. MILD TO MODERATE NAUSEA FOR THR PAST 2 NIGHTS. PT DENIES FEVER, CHILLS, BRBPR, melena, diarrhea.  Past Medical History  Diagnosis Date  . IBS (irritable bowel syndrome)   . S/P endoscopy 2008, Aug 2010, Sept 2012    Dr. Linna Darner 2008: removal of impacted food bolus, Dr. Linna Darner 2010: moderate gastritis, Sept 2012 with SLF: path with mild gastritis, no definite stricture noted, dilation with Svary 16 mm  . S/P colonoscopy April 2009, Sept 2012    normal Dr. Linna Darner, internal hemorrhoids, repeat in Sept 2022  . Numbness and tingling in right hand     and whole left side of body-pt sts she has "spells to where she cannot walk or talk"  . Fibromyalgia     sees Dr Philis Kendall at [facility]  . Peripheral neuropathy   . Myasthenia gravis     right sided weakness occ.  . Sleep apnea     no cpap used, was told to get oxygen  to use   Past Surgical History  Procedure Laterality Date  . Nissen fundoplication    . Cholecystectomy    . Abdominal hysterectomy    . Esophageal surgery?      2008 [facility]  . Hand surgery      carpal tumnnel right and removal of cyst left  . Neck surgery      removal of "knot"t from neck  . Bladder surgery      stretch bladder opening  . Savory dilation  07/18/2011    Procedure: SAVORY DILATION;  Surgeon: Arlyce Harman, MD;  Location: [facility];  Service: Endoscopy;  Laterality: N/A;  with propofol sedation ; #16 savory dilation  . Esophageal manometry N/A 01/21/2013    Procedure: ESOPHAGEAL MANOMETRY (EM);  Surgeon: Valarie Merino,  MD;  Location: [facility];  Service: General;  Laterality: N/A;  . Laparoscopic nissen fundoplication N/A 02/13/2013    Procedure: Redo LAPAROSCOPIC NISSEN FUNDOPLICATION;  Surgeon: Valarie Merino, MD;  Location: [facility];  Service: General;  Laterality: N/A;  Forgut explortation with partial  takedown nissan funliplication from 1994 for wrap torsion and persistant dysphagia    Allergies  Allergen Reactions  . Avelox (Moxifloxacin Hcl In Nacl) Anaphylaxis and Itching  . Linzess (Linaclotide) Swelling    Swelling, bloating, nausea.Med used for "IBS"  . Adhesive (Tape) Other (See Comments)    Red and itchy under tape  . Cephalexin Hives and Itching  . Codeine Hives and Itching  . Cymbalta (Duloxetine Hcl) Other (See Comments)    Hair loss, nervousness, severe constipation, red blotches  . Dicyclomine Itching and Other (See Comments)    Nervousness  . Latex Itching  . Macrolides And Ketolides Swelling  . Meperidine Hcl Itching and Other (See Comments)    Nervousness  . Metronidazole Hives and Other (See Comments)    Red blotches.  . Morphine Itching and Other (See Comments)    Nervousness  . Penicillins Hives  . Prednisone Hives, Swelling and Other (See Comments)    Red blotches -  Can take with Benadryl  . Sulfonamide Derivatives Hives  . Tetracycline Other (See Comments)    Reaction unknown  . Tramadol Hcl Hives and Other (See Comments)    Nervousness    Current Outpatient Prescriptions  Medication Sig Dispense Refill  . acetaminophen (TYLENOL) 650 MG CR tablet Take 650 mg by mouth every 8 (eight) hours as needed for pain.       Marland Kitchen albuterol (PROVENTIL HFA;VENTOLIN HFA) 108 (90 BASE) MCG/ACT inhaler Inhale 2 puffs into the lungs every 4 (four) hours as needed for shortness of breath.      Marland Kitchen azelastine (ASTELIN) 137 MCG/SPRAY nasal spray Place 1 spray into the nose 2 (two) times daily.       Marland Kitchen CARAFATE 1 GM/10ML suspension Take 1 g by mouth 4 (four) times daily.       .  Cholecalciferol ([employer] VIT D 5000 HIGH-POTENCY) 5000 UNITS capsule Take 5,000 Units by mouth every morning.       . cycloSPORINE (RESTASIS) 0.05 % ophthalmic emulsion Place 1 drop into both eyes 2 (two) times daily as needed.       Marland Kitchen esomeprazole (NEXIUM) 40 MG capsule Take 40 mg by mouth 2 (two) times daily.       Marland Kitchen estrogens, conjugated, (PREMARIN) 0.625 MG tablet Take 0.625 mg by mouth every morning. Take daily for 21 days then do not take for 7 days.      . hydrOXYzine (VISTARIL) 25 MG capsule Take 25 mg by mouth at bedtime.       Marland Kitchen L-Methylfolate-B6-B12 (METANX PO) Take 1 tablet by mouth at bedtime.       Marland Kitchen levocetirizine (XYZAL) 5 MG tablet Take 5 mg by mouth every morning.       . lubiprostone ([name]) 24 MCG capsule Take 24 mcg by mouth 2 (two) times daily with a meal.        . magnesium citrate solution Take 296 mLs by mouth daily as needed. For blockage      . Olopatadine HCl (PATADAY) 0.2 % SOLN Place 1 drop into both eyes daily as needed (For allergies.).       Marland Kitchen PRESCRIPTION MEDICATION Apply 1 application topically every morning. LCD10% with Salicylic Acid 2%. Use as directed for skin and rashes.      . simethicone (GAS-X) 80 MG chewable tablet Chew 80 mg by mouth every 6 (six) hours as needed. For gas  2-3X/DAY    . tetrahydrozoline 0.05 % ophthalmic solution Place 1 drop into both eyes 4 (four) times daily as needed (For eye irritation.).      Marland Kitchen tiZANidine (ZANAFLEX) 4 MG tablet Take 4 mg by mouth at bedtime.       . triamcinolone (KENALOG) 0.1 % cream Apply 1 application topically 2 (two) times daily as needed. For skin.          Review of Systems     Objective:   Physical Exam  Vitals reviewed. Constitutional: She is oriented to person, place, and time. She appears well-nourished. No distress.  HENT:  Head: Normocephalic and atraumatic.  Mouth/Throat: Oropharynx is clear and moist. No oropharyngeal exudate.  Eyes: Pupils are equal, round, and reactive to light. No scleral  icterus.  Neck: Normal range of motion. Neck supple.  Cardiovascular: Normal rate, regular rhythm and normal heart sounds.   Pulmonary/Chest: Effort normal. No respiratory distress.  Abdominal: Soft. Bowel sounds are normal. She exhibits no distension. There is tenderness.  MILD TTP IN THE EPIGASTRIUM & RUQ,  INCISIONS WELL HEALED  Musculoskeletal: She exhibits no edema.  Lymphadenopathy:    She has no cervical adenopathy.  Neurological: She is alert and oriented to person, place, and time.  NO  NEW FOCAL DEFICITS   Psychiatric: She has a normal mood and affect.          Assessment & Plan:

## 2013-03-13 NOTE — Assessment & Plan Note (Signed)
SX FAIRLY WELL CONTROLLED.  1. PUT THE HEAD OF YOUR BED ON 6 INCH BLOCKS.  2. CONTINUE NEXIUM. TAKE 30 MINUTES BEFORE MEALS TWICE DAILY.  3. FOLLOW FULL LIQUID DIET FOR 3 DAYS WITH FLARES OF PAIN, NAUSEA, OR VOMITING THEN FOLLOW A LOW FAT/SOFT MECHANICAL DIET.   4. EAT 4 TO 6 SMALL MEALS DAILY.  5. STAY UPRIGHT 30 MINUTES AFTER EATING. WAIT 3 TO 4 HOURS BEFORE LAYING DOWN AFTER EATING.  6. FOLLOW UP IN 6 MOS

## 2013-03-15 ENCOUNTER — Encounter (HOSPITAL_COMMUNITY): Payer: Self-pay | Admitting: Psychology

## 2013-03-15 NOTE — Progress Notes (Signed)
Patient:  Yesenia Boyd   DOB: 02/23/1956  MR Number: [id number]  Location: BEHAVIORAL [facility] [address] [place] [address] Dept: [phone]  Start: 4 PM End: 5 PM  Provider/Observer:     Hershal Coria PSYD  Chief Complaint:      Chief Complaint  Patient presents with  . Anxiety  . Depression  . Stress  . Trauma    Reason For Service:     The patient initially presented with issues of depression, anxiety, and adjustment difficulties. She has had a lot of traumatic and stressful events in her life that she has difficulty coping with. The patient has numerous medical issues that can be found in her medical chart. Many these have some components to difficulty with sympathetic arousal and anxiety issues.  Interventions Strategy:  Cognitive/behavioral psychotherapeutic interventions  Participation Level:   Active  Participation Quality:  Appropriate      Behavioral Observation:  Well Groomed, Alert, and Appropriate.   Current Psychosocial Factors: The patient reports that she has had her surgery and is a little disappointed that she has not gotten as much relief as she. She reports that the surgeon said there was a lot of complications from previous surgeries that he felt some of them he was not able to reverse. He did try to improve some things and she reports that the areas that he worked on half experience an improvement and reduction in pain.  Content of Session:   Reviewed current symptoms and continued work on therapeutic interventions for depression and anxiety.  Current Status:   The patient reports that continued psychosocial stressors are having a negative impact on her mood state. Depression and anxiety continue..  Patient Progress:   Very good  Target Goals:   Target goals include reducing the intensity, duration, and frequency of significant episodes of depression and  anxiety.  Last Reviewed:   422/2014  Goals Addressed Today:    Today we worked on Producer, television/film/video and strategies are in issues of her depression and anxiety.  Impression/Diagnosis:   The patient is a long-standing history of recurrent depression and anxiety. Fibromyalgia and irritable bowel syndrome are also present. 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  Diagnosis:    Axis I:  Major depression, recurrent  Anxiety      Axis II: No diagnosis

## 2013-03-19 ENCOUNTER — Ambulatory Visit (INDEPENDENT_AMBULATORY_CARE_PROVIDER_SITE_OTHER): Payer: BC Managed Care – PPO | Admitting: Psychology

## 2013-03-19 DIAGNOSIS — F339 Major depressive disorder, recurrent, unspecified: Secondary | ICD-10-CM | POA: Diagnosis not present

## 2013-03-19 DIAGNOSIS — F411 Generalized anxiety disorder: Secondary | ICD-10-CM | POA: Diagnosis not present

## 2013-04-05 DIAGNOSIS — Z79899 Other long term (current) drug therapy: Secondary | ICD-10-CM | POA: Diagnosis not present

## 2013-04-05 DIAGNOSIS — R1310 Dysphagia, unspecified: Secondary | ICD-10-CM | POA: Diagnosis not present

## 2013-04-08 ENCOUNTER — Telehealth (INDEPENDENT_AMBULATORY_CARE_PROVIDER_SITE_OTHER): Payer: Self-pay

## 2013-04-08 NOTE — Telephone Encounter (Signed)
Patient calling in to report having a continuous sore throat and throat irritation.  Patient has been seen by her PCP and has another appointment today with Dr. Olena Leatherwood. Patient feel's like her reflux is not improving.  Patient states she had imaging done at [facility].  Patient reports that she has followed advice given from her PCP but, has no relief.  Patient reports having asthma, sinus issues and many allergies would could be contributing to her throat irritation.  Patient has tried elevating the back of her bed but, at times she's not able to sleep with the elevation.  Patient will continue with her appointment today with her PCP and call our office and schedule a follow up appointment if recommended by her PCP.

## 2013-04-15 ENCOUNTER — Encounter (HOSPITAL_COMMUNITY): Payer: Self-pay | Admitting: Psychology

## 2013-04-15 NOTE — Progress Notes (Signed)
Patient:  Yesenia Boyd   DOB: Dec 05, 1955  MR Number: [id number]  Location: BEHAVIORAL [facility] [address] [place] [address] Dept: [phone]  Start: 4 PM End: 5 PM  Provider/Observer:     Hershal Coria PSYD  Chief Complaint:      Chief Complaint  Patient presents with  . Anxiety  . Depression  . Stress    Reason For Service:     The patient initially presented with issues of depression, anxiety, and adjustment difficulties. She has had a lot of traumatic and stressful events in her life that she has difficulty coping with. The patient has numerous medical issues that can be found in her medical chart. Many these have some components to difficulty with sympathetic arousal and anxiety issues.  Interventions Strategy:  Cognitive/behavioral psychotherapeutic interventions  Participation Level:   Active  Participation Quality:  Appropriate      Behavioral Observation:  Well Groomed, Alert, and Appropriate.   Current Psychosocial Factors: The patient reports that she has had a very difficult time recovering from the previous surgery and is having some pain. However, she feels like they were able to help with one of the issues with her abdominal pain but not with another.  Content of Session:   Reviewed current symptoms and continued work on therapeutic interventions for depression and anxiety.  Current Status:   The patient reports that continued psychosocial stressors are having a negative impact on her mood state. Depression and anxiety continue..  Patient Progress:   Very good  Target Goals:   Target goals include reducing the intensity, duration, and frequency of significant episodes of depression and anxiety.  Last Reviewed:   03/19/2013  Goals Addressed Today:    Today we worked on Producer, television/film/video and strategies are in issues of her depression and  anxiety.  Impression/Diagnosis:   The patient is a long-standing history of recurrent depression and anxiety. Fibromyalgia and irritable bowel syndrome are also present. 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  Diagnosis:    Axis I:  Major depression, recurrent  Anxiety state, unspecified      Axis II: No diagnosis

## 2013-04-19 ENCOUNTER — Ambulatory Visit (INDEPENDENT_AMBULATORY_CARE_PROVIDER_SITE_OTHER): Payer: BC Managed Care – PPO | Admitting: Psychology

## 2013-04-19 DIAGNOSIS — F339 Major depressive disorder, recurrent, unspecified: Secondary | ICD-10-CM | POA: Diagnosis not present

## 2013-04-19 DIAGNOSIS — F411 Generalized anxiety disorder: Secondary | ICD-10-CM

## 2013-05-02 ENCOUNTER — Encounter (INDEPENDENT_AMBULATORY_CARE_PROVIDER_SITE_OTHER): Payer: Self-pay | Admitting: Surgery

## 2013-05-02 ENCOUNTER — Ambulatory Visit (INDEPENDENT_AMBULATORY_CARE_PROVIDER_SITE_OTHER): Payer: BC Managed Care – PPO | Admitting: Surgery

## 2013-05-02 VITALS — BP 114/68 | HR 68 | Resp 14 | Ht 63.0 in | Wt 141.8 lb

## 2013-05-02 DIAGNOSIS — Z9889 Other specified postprocedural states: Secondary | ICD-10-CM

## 2013-05-02 DIAGNOSIS — Z09 Encounter for follow-up examination after completed treatment for conditions other than malignant neoplasm: Secondary | ICD-10-CM

## 2013-05-02 NOTE — Patient Instructions (Signed)
Obtain Coenzyme Q from health food store Obtain general multipurpose vitamin Try drinking a small bottle of tonic water (contains quinine) every other day

## 2013-05-02 NOTE — Progress Notes (Signed)
Yesenia Boyd 57 y.o.  Body mass index is 25.13 kg/(m^2).  Patient Active Problem List   Diagnosis Date Noted  . Lap Nissen 1993 [place], [place] with postop prolonged dysphagia--partial takedown April 2014 01/07/2013  . Abdominal  pain, other specified site 12/27/2011  . Rectal bleeding 06/22/2011  . GERD 09/01/2009  . RECTAL PAIN 06/02/2009  . DYSPHAGIA UNSPECIFIED 06/02/2009  . IRRITABLE BOWEL, PREDOMINANTLY CONSTIPATION 02/11/2009  . PERIPHERAL NEUROPATHY, FEET 02/05/2009  . FIBROMYALGIA 02/05/2009  . 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 ALLERGY, HX OF 02/05/2009    Allergies  Allergen Reactions  . Avelox (Moxifloxacin Hcl In Nacl) Anaphylaxis and Itching  . Linzess (Linaclotide) Swelling    Swelling, bloating, nausea.Med used for "IBS"  . Adhesive (Tape) Other (See Comments)    Red and itchy under tape  . Cephalexin Hives and Itching  . Codeine Hives and Itching  . Cymbalta (Duloxetine Hcl) Other (See Comments)    Hair loss, nervousness, severe constipation, red blotches  . Dicyclomine Itching and Other (See Comments)    Nervousness  . Latex Itching  . Macrolides And Ketolides Swelling  . Meperidine Hcl Itching and Other (See Comments)    Nervousness  . Metronidazole Hives and Other (See Comments)    Red blotches.  . Morphine Itching and Other (See Comments)    Nervousness  . Penicillins Hives  . Prednisone Hives, Swelling and Other (See Comments)    Red blotches - Can take with Benadryl  . Sulfonamide Derivatives Hives  . Tetracycline Other (See Comments)    Reaction unknown  . Tramadol Hcl Hives and Other (See Comments)    Nervousness    Past Surgical History  Procedure Laterality Date  . Nissen fundoplication    . Cholecystectomy    . Abdominal hysterectomy    . Esophageal surgery?      2008 [facility]  . Hand surgery      carpal tumnnel right and removal of cyst left  . Neck surgery      removal of "knot"t from neck  . Bladder surgery      stretch  bladder opening  . Savory dilation  07/18/2011    Procedure: SAVORY DILATION;  Surgeon: Arlyce Harman, MD;  Location: [facility];  Service: Endoscopy;  Laterality: N/A;  with propofol sedation ; #16 savory dilation  . Esophageal manometry N/A 01/21/2013    Procedure: ESOPHAGEAL MANOMETRY (EM);  Surgeon: Valarie Merino, MD;  Location: [facility];  Service: General;  Laterality: N/A;  . Laparoscopic nissen fundoplication N/A 02/13/2013    Procedure: Redo LAPAROSCOPIC NISSEN FUNDOPLICATION;  Surgeon: Valarie Merino, MD;  Location: [facility];  Service: General;  Laterality: N/A;  Forgut explortation with partial  takedown nissan funliplication from 1994 for wrap torsion and persistant dysphagia   HASANAJ,XAJE A, MD No diagnosis found.  followup with Ms. [name] and her swallowing. She is able to eat food which she was unable the before. It sounds like she still having some proximal esophageal dysphasia which she relates to an underlying muscle problem that she said no one has ever been able to figure out. She reports episodes of muscle twitching in her feet and hands it occurs sometimes after she eats. She is not taking any dietary supplements and, recommend that she get a general multipurpose vitamin and also at coenzyme Q. I've also mentioned to try tonic water which contains quinine to see if that will improve her muscle spasms. I will see her back in 2 months.  Matt B. Daphine Deutscher, MD, [facility] [phone] beeper [phone]  05/02/2013 11:00 AM

## 2013-05-03 ENCOUNTER — Encounter (INDEPENDENT_AMBULATORY_CARE_PROVIDER_SITE_OTHER): Payer: BC Managed Care – PPO | Admitting: Surgery

## 2013-05-17 ENCOUNTER — Ambulatory Visit (INDEPENDENT_AMBULATORY_CARE_PROVIDER_SITE_OTHER): Payer: BC Managed Care – PPO | Admitting: Psychology

## 2013-05-17 DIAGNOSIS — F339 Major depressive disorder, recurrent, unspecified: Secondary | ICD-10-CM

## 2013-05-17 DIAGNOSIS — F411 Generalized anxiety disorder: Secondary | ICD-10-CM

## 2013-06-03 ENCOUNTER — Encounter (HOSPITAL_COMMUNITY): Payer: Self-pay | Admitting: Psychology

## 2013-06-03 NOTE — Progress Notes (Signed)
Patient:  Yesenia Boyd   DOB: [birth year]  MR Number: [id number]  Location: BEHAVIORAL [facility] [address] [place] [address] Dept: [phone]  Start: 4 PM End: 5 PM  Provider/Observer:     Hershal Coria PSYD  Chief Complaint:      Chief Complaint  Patient presents with  . Anxiety  . Depression    Reason For Service:     The patient initially presented with issues of depression, anxiety, and adjustment difficulties. She has had a lot of traumatic and stressful events in her life that she has difficulty coping with. The patient has numerous medical issues that can be found in her medical chart. Many these have some components to difficulty with sympathetic arousal and anxiety issues.  Interventions Strategy:  Cognitive/behavioral psychotherapeutic interventions  Participation Level:   Active  Participation Quality:  Appropriate      Behavioral Observation:  Well Groomed, Alert, and Appropriate.   Current Psychosocial Factors: The patient reports that she has done much better with her family as she has been refusing to get involved in the middle of arguments and disagreements. The patient reports that she as long as her family members aren't doing as much as they can't take care of themselves that she has been able to better handle not jumping into their situation.  Content of Session:   Reviewed current symptoms and continued work on therapeutic interventions for depression and anxiety.  Current Status:   The patient reports that continued psychosocial stressors are having a negative impact on her mood state. Depression and anxiety continue..  Patient Progress:   Very good  Target Goals:   Target goals include reducing the intensity, duration, and frequency of significant episodes of depression and anxiety.  Last Reviewed:   04/19/2013  Goals Addressed Today:    Today we worked on  Producer, television/film/video and strategies are in issues of her depression and anxiety.  Impression/Diagnosis:   The patient is a long-standing history of recurrent depression and anxiety. Fibromyalgia and irritable bowel syndrome are also present. 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  Diagnosis:    Axis I:  Major depression, recurrent  Anxiety state, unspecified      Axis II: No diagnosis

## 2013-06-17 ENCOUNTER — Ambulatory Visit (INDEPENDENT_AMBULATORY_CARE_PROVIDER_SITE_OTHER): Payer: BC Managed Care – PPO | Admitting: Psychology

## 2013-06-17 DIAGNOSIS — F411 Generalized anxiety disorder: Secondary | ICD-10-CM

## 2013-06-17 DIAGNOSIS — F339 Major depressive disorder, recurrent, unspecified: Secondary | ICD-10-CM | POA: Diagnosis not present

## 2013-06-19 ENCOUNTER — Encounter (HOSPITAL_COMMUNITY): Payer: Self-pay | Admitting: Psychology

## 2013-06-19 NOTE — Progress Notes (Signed)
Patient:  Yesenia Boyd   DOB: 12-Jul-1956  MR Number: [id number]  Location: BEHAVIORAL [facility] [address] [place] [address] Dept: [phone]  Start: 4 PM End: 5 PM  Provider/Observer:     Hershal Coria PSYD  Chief Complaint:      Chief Complaint  Patient presents with  . Anxiety  . Depression  . Stress    Reason For Service:     The patient initially presented with issues of depression, anxiety, and adjustment difficulties. She has had a lot of traumatic and stressful events in her life that she has difficulty coping with. The patient has numerous medical issues that can be found in her medical chart. Many these have some components to difficulty with sympathetic arousal and anxiety issues.  Interventions Strategy:  Cognitive/behavioral psychotherapeutic interventions  Participation Level:   Active  Participation Quality:  Appropriate      Behavioral Observation:  Well Groomed, Alert, and Appropriate.   Current Psychosocial Factors: The patient reports that she is still struggling with issues related to her daughter and her son. Most pressing for her recently had to do with difficulty that her son is having accident and here..  Content of Session:   Reviewed current symptoms and continued work on therapeutic interventions for depression and anxiety.  Current Status:   The patient reports that continued psychosocial stressors are having a negative impact on her mood state. Depression and anxiety continue..  Patient Progress:   Very good  Target Goals:   Target goals include reducing the intensity, duration, and frequency of significant episodes of depression and anxiety.  Last Reviewed:   06/17/2013  Goals Addressed Today:    Today we worked on Producer, television/film/video and strategies are in issues of her depression and anxiety.  Impression/Diagnosis:   The patient is a long-standing  history of recurrent depression and anxiety. Fibromyalgia and irritable bowel syndrome are also present. 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  Diagnosis:    Axis I:  Major depression, recurrent  Anxiety state, unspecified      Axis II: No diagnosis

## 2013-07-04 ENCOUNTER — Encounter (INDEPENDENT_AMBULATORY_CARE_PROVIDER_SITE_OTHER): Payer: Self-pay | Admitting: Surgery

## 2013-07-04 ENCOUNTER — Ambulatory Visit (INDEPENDENT_AMBULATORY_CARE_PROVIDER_SITE_OTHER): Payer: BC Managed Care – PPO | Admitting: Surgery

## 2013-07-04 VITALS — BP 126/72 | HR 68 | Temp 97.6°F | Resp 14 | Ht 63.0 in | Wt 142.6 lb

## 2013-07-04 DIAGNOSIS — Z9889 Other specified postprocedural states: Secondary | ICD-10-CM

## 2013-07-04 DIAGNOSIS — Z09 Encounter for follow-up examination after completed treatment for conditions other than malignant neoplasm: Secondary | ICD-10-CM

## 2013-07-04 NOTE — Patient Instructions (Signed)
Thanks for your patience.  If you need further assistance after leaving the office, please call our office and speak with a [facility] nurse.  [phone].  If you want to leave a message for Dr. Keshav Winegar, please call his office phone at [phone]. 

## 2013-07-04 NOTE — Progress Notes (Signed)
Yesenia Boyd 57 y.o.  Body mass index is 25.27 kg/(m^2).  Patient Active Problem List   Diagnosis Date Noted  . Lap Nissen 1993 [place], [place] with postop prolonged dysphagia--partial takedown April 2014 01/07/2013  . Abdominal  pain, other specified site 12/27/2011  . Rectal bleeding 06/22/2011  . GERD 09/01/2009  . RECTAL PAIN 06/02/2009  . DYSPHAGIA UNSPECIFIED 06/02/2009  . IRRITABLE BOWEL, PREDOMINANTLY CONSTIPATION 02/11/2009  . PERIPHERAL NEUROPATHY, FEET 02/05/2009  . FIBROMYALGIA 02/05/2009  . 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 ALLERGY, HX OF 02/05/2009    Allergies  Allergen Reactions  . Avelox [Moxifloxacin Hcl In Nacl] Anaphylaxis and Itching  . Linzess [Linaclotide] Swelling    Swelling, bloating, nausea.Med used for "IBS"  . Adhesive [Tape] Other (See Comments)    Red and itchy under tape  . Cephalexin Hives and Itching  . Codeine Hives and Itching  . Cymbalta [Duloxetine Hcl] Other (See Comments)    Hair loss, nervousness, severe constipation, red blotches  . Dicyclomine Itching and Other (See Comments)    Nervousness  . Latex Itching  . Macrolides And Ketolides Swelling  . Meperidine Hcl Itching and Other (See Comments)    Nervousness  . Metronidazole Hives and Other (See Comments)    Red blotches.  . Morphine Itching and Other (See Comments)    Nervousness  . Penicillins Hives  . Prednisone Hives, Swelling and Other (See Comments)    Red blotches - Can take with Benadryl  . Sulfonamide Derivatives Hives  . Tetracycline Other (See Comments)    Reaction unknown  . Tramadol Hcl Hives and Other (See Comments)    Nervousness    Past Surgical History  Procedure Laterality Date  . Nissen fundoplication    . Cholecystectomy    . Abdominal hysterectomy    . Esophageal surgery?      2008 [facility]  . Hand surgery      carpal tumnnel right and removal of cyst left  . Neck surgery      removal of "knot"t from neck  . Bladder surgery      stretch  bladder opening  . Savory dilation  07/18/2011    Procedure: SAVORY DILATION;  Surgeon: Arlyce Harman, MD;  Location: [facility];  Service: Endoscopy;  Laterality: N/A;  with propofol sedation ; #16 savory dilation  . Esophageal manometry N/A 01/21/2013    Procedure: ESOPHAGEAL MANOMETRY (EM);  Surgeon: Valarie Merino, MD;  Location: [facility];  Service: General;  Laterality: N/A;  . Laparoscopic nissen fundoplication N/A 02/13/2013    Procedure: Redo LAPAROSCOPIC NISSEN FUNDOPLICATION;  Surgeon: Valarie Merino, MD;  Location: [facility];  Service: General;  Laterality: N/A;  Forgut explortation with partial  takedown nissan funliplication from 1994 for wrap torsion and persistant dysphagia   HASANAJ,XAJE A, MD No diagnosis found.  Postop long-term followup for Ms. Heroux. She is having some issues with her hemorrhoids bothering her intermittently and I told her that she needed to get some K-Y jelly and pushed those back in. She needs to get on a regimen of twice weekly enemas to help facilitate her bowels.  She is able to swallow much better. He asked me question about her incision and how they will count of pulling her abdominal wall. I think this is related to scar tissue and how it pulling the skin and a little dimpled areas. This is not bad when I look at it but I think she's more aware of it.  I think  overall she's had a reasonably good outcome from a difficult problem. I will be glad to see her again whenever needed. Matt B. Daphine Deutscher, MD, [facility] [phone] beeper [phone]  07/04/2013 9:51 AM

## 2013-07-11 DIAGNOSIS — Z23 Encounter for immunization: Secondary | ICD-10-CM | POA: Diagnosis not present

## 2013-07-17 ENCOUNTER — Encounter (HOSPITAL_COMMUNITY): Payer: Self-pay | Admitting: Psychology

## 2013-07-17 NOTE — Progress Notes (Signed)
Patient:  Yesenia Boyd   DOB: 10/28/1956  MR Number: [id number]  Location: [facility]HAVIORAL [facility] [address] [place] [address] Dept: [phone]  Start: 3 PM End: 4 PM  Provider/Observer:     Hershal Coria PSYD  Chief Complaint:      Chief Complaint  Patient presents with  . Anxiety  . Depression    Reason For Service:     The patient initially presented with issues of depression, anxiety, and adjustment difficulties. She has had a lot of traumatic and stressful events in her life that she has difficulty coping with. The patient has numerous medical issues that can be found in her medical chart. Many these have some components to difficulty with sympathetic arousal and anxiety issues.  Interventions Strategy:  Cognitive/behavioral psychotherapeutic interventions  Participation Level:   Active  Participation Quality:  Appropriate      Behavioral Observation:  Well Groomed, Alert, and Appropriate.   Current Psychosocial Factors: The patient reports that the situation is between her and her daughter have improved somewhat but she is continuing to struggle with what is happening with her son and his inability to function very well independently. She constantly feels like she is being drawn into these situations and forced to solve problems but much of the time her efforts going pain.  Content of Session:   Reviewed current symptoms and continued work on therapeutic interventions for depression and anxiety.  Current Status:   The patient reports that continued psychosocial stressors are having a negative impact on her mood state. Depression and anxiety continue..  Patient Progress:   Very good  Target Goals:   Target goals include reducing the intensity, duration, and frequency of significant episodes of depression and anxiety.  Last Reviewed:   05/17/2013  Goals Addressed Today:    Today we  worked on Producer, television/film/video and strategies are in issues of her depression and anxiety.  Impression/Diagnosis:   The patient is a long-standing history of recurrent depression and anxiety. Fibromyalgia and irritable bowel syndrome are also present. 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  Diagnosis:    Axis I:  Major depression, recurrent  Anxiety state, unspecified      Axis II: No diagnosis

## 2013-07-19 ENCOUNTER — Ambulatory Visit (INDEPENDENT_AMBULATORY_CARE_PROVIDER_SITE_OTHER): Payer: BC Managed Care – PPO | Admitting: Psychology

## 2013-07-19 DIAGNOSIS — F339 Major depressive disorder, recurrent, unspecified: Secondary | ICD-10-CM

## 2013-07-19 DIAGNOSIS — F411 Generalized anxiety disorder: Secondary | ICD-10-CM | POA: Diagnosis not present

## 2013-07-29 ENCOUNTER — Other Ambulatory Visit: Payer: Self-pay | Admitting: Obstetrics and Gynecology

## 2013-08-06 ENCOUNTER — Encounter (INDEPENDENT_AMBULATORY_CARE_PROVIDER_SITE_OTHER): Payer: Self-pay

## 2013-08-19 ENCOUNTER — Ambulatory Visit (INDEPENDENT_AMBULATORY_CARE_PROVIDER_SITE_OTHER): Payer: BC Managed Care – PPO | Admitting: Psychology

## 2013-08-19 DIAGNOSIS — F339 Major depressive disorder, recurrent, unspecified: Secondary | ICD-10-CM

## 2013-08-19 DIAGNOSIS — F411 Generalized anxiety disorder: Secondary | ICD-10-CM | POA: Diagnosis not present

## 2013-08-21 ENCOUNTER — Other Ambulatory Visit: Payer: Self-pay | Admitting: Obstetrics and Gynecology

## 2013-08-29 ENCOUNTER — Ambulatory Visit (INDEPENDENT_AMBULATORY_CARE_PROVIDER_SITE_OTHER): Payer: BC Managed Care – PPO | Admitting: Obstetrics and Gynecology

## 2013-08-29 ENCOUNTER — Encounter: Payer: Self-pay | Admitting: Obstetrics and Gynecology

## 2013-08-29 ENCOUNTER — Encounter (INDEPENDENT_AMBULATORY_CARE_PROVIDER_SITE_OTHER): Payer: Self-pay

## 2013-08-29 VITALS — BP 104/60 | Ht 63.5 in | Wt 143.2 lb

## 2013-08-29 DIAGNOSIS — Z1273 Encounter for screening for malignant neoplasm of ovary: Secondary | ICD-10-CM

## 2013-08-29 DIAGNOSIS — Z1212 Encounter for screening for malignant neoplasm of rectum: Secondary | ICD-10-CM

## 2013-08-29 DIAGNOSIS — Z01419 Encounter for gynecological examination (general) (routine) without abnormal findings: Secondary | ICD-10-CM | POA: Diagnosis not present

## 2013-08-29 DIAGNOSIS — Z0000 Encounter for general adult medical examination without abnormal findings: Secondary | ICD-10-CM

## 2013-08-29 LAB — HEMOCCULT GUIAC POC 1CARD (OFFICE): Fecal Occult Blood, POC: NEGATIVE

## 2013-08-29 NOTE — Progress Notes (Signed)
Patient ID: Yesenia Boyd, female   DOB: [birth year], 57 y.o.   MRN: [id number]  Assessment:  Annual Gyn Exam Hx. 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  2. return semi-annually or prn 3    Annual mammogram advised 4. Pt given info about gyn clinic in [place] that focused on rectovaginal issues, pt to increase use of miralax for now, and discuss with her neurology consultants at [place] at next visit. Subjective:  Yesenia Boyd is a 57 y.o. female No obstetric history on file. who presents for annual exam. No LMP recorded. Patient has had a hysterectomy. The patient has complaints today of frequent urination and nocturia x1-2 nightly. History of irritable bowel constipation dominant.   The following portions of the patient's history were reviewed and updated as appropriate: allergies, current medications, past family history, past medical history, past social history, past surgical history and problem list.  Review of Systems Constitutional: negative, History of Nissen fundoplication for type II hiatal hernia, with revision Gastrointestinal: negative, IBS-C. Genitourinary: Status post hysterectomy, TAH/BSO, frequent urination patient require extremities mag citrate and enemas for defecation  Patient gets mammograms, next mammogram view November 14  Objective:  BP 104/60  Ht 5' 3.5" (1.613 m)  Wt 143 lb 3.2 oz (64.955 kg)  BMI 24.97 kg/m2   BMI: Body mass index is 24.97 kg/(m^2).  General Appearance: Alert, appropriate appearance for age. No acute distress HEENT: Grossly normal Neck / Thyroid:  Cardiovascular: RRR; normal S1, S2, no murmur Lungs: CTA bilaterally Back: No CVAT Breast Exam: No dimpling, nipple retraction or discharge. No masses or nodes.  and No masses or nodes.No dimpling, nipple retraction or discharge. Gastrointestinal: Soft, non-tender, no masses or organomegaly Pelvic Exam: External genitalia: normal general appearance Cervix: absent Adnexa: normal bimanual exam Uterus: absent Rectovaginal: Digital rectal exam quite unusual in that than with patient relaxation she is barely able to tolerate insertion digit, AND above that she has a very generous rectal vault. The patient describes her bowel movements as quite small In diameter Lymphatic Exam: Non-palpable nodes in neck, clavicular, axillary, or inguinal regions Skin: no rash or abnormalities Neurologic: Normal gait and speech, no tremor  Psychiatric: Alert and oriented, appropriate affect.  Urinalysis:Not done  Christin Bach. MD Pgr [phone] 9:46 AM

## 2013-08-29 NOTE — Patient Instructions (Signed)
PLEASE be sure your gastroenterologist are aware of the very small diameter of your stools, and then the anus appears to be ,in my opinion much tighter than normal

## 2013-09-03 DIAGNOSIS — Z0000 Encounter for general adult medical examination without abnormal findings: Secondary | ICD-10-CM | POA: Insufficient documentation

## 2013-09-11 ENCOUNTER — Encounter (INDEPENDENT_AMBULATORY_CARE_PROVIDER_SITE_OTHER): Payer: Self-pay

## 2013-09-11 ENCOUNTER — Ambulatory Visit (INDEPENDENT_AMBULATORY_CARE_PROVIDER_SITE_OTHER): Payer: BC Managed Care – PPO | Admitting: Gastroenterology

## 2013-09-11 ENCOUNTER — Encounter: Payer: Self-pay | Admitting: Gastroenterology

## 2013-09-11 VITALS — BP 109/65 | HR 70 | Temp 97.6°F | Wt 143.8 lb

## 2013-09-11 DIAGNOSIS — K625 Hemorrhage of anus and rectum: Secondary | ICD-10-CM

## 2013-09-11 DIAGNOSIS — K219 Gastro-esophageal reflux disease without esophagitis: Secondary | ICD-10-CM

## 2013-09-11 DIAGNOSIS — K589 Irritable bowel syndrome without diarrhea: Secondary | ICD-10-CM | POA: Diagnosis not present

## 2013-09-11 NOTE — Progress Notes (Signed)
Subjective:    Patient ID: Yesenia Boyd, female    DOB: 23-Jun-1956, 57 y.o.   MRN: [id number]  Yesenia Hopping A, MD  HPI Saw Gyn said she has STRICTURED ANUS. She still has epigastric pain. HAD CORTISONE IN HER NECK AND BACK. IF EATS SALAD OR CERTAIN VEGETABLE HAS Boyd HARD TIME. PUTTING BED ON BLOCK MADE HER NECK AND BACK HURT. WATCHING WHAT SHE EATS. TAKING HER NEXIUM. CONTINUES WITH EPIGASTRIC PAIN BUT UPPER ABDOMINAL PAIN IS BETTER. BMs: DIFFICULTY PASSING STOOL. WITH [name]/MG CITRATE: BM DAILY. IF NO CITRATE, MAY GO 2 DAYS W/O BM BUT SHE HURTS LIKE CRAZY IF SHE DOESN'T HAVE Boyd BM.RARE BLOOD IN STOOL. HAS RECTAL PAIN: BURNING/SHARP. SUPP MAKES HER HAVE CRAMPY ABD PAIN. MAY HAVE OCCASIONAL NAUSEA. REFLUX MAY CHOKE HER JUST ABOUT EVERY DAY IF SHE EATS THE WRONG THING. PT DENIES FEVER, CHILLS, vomiting, melena, OR constipation. MAY HAVE problems swallowing: SOLIDS AND CAN'T EAT AND DRINK AT THE SAME TIME.SOMETIMES LIQUIDS. MUCINEX HELPS WITH REFLUX.    Past Medical History  Diagnosis Date  . IBS (irritable bowel syndrome)   . S/P endoscopy 2008, Aug 2010, Sept 2012    Dr. Linna Darner 2008: removal of impacted food bolus, Dr. Linna Darner 2010: moderate gastritis, Sept 2012 with SLF: path with mild gastritis, no definite stricture noted, dilation with Svary 16 mm  . S/P colonoscopy April 2009, Sept 2012    normal Dr. Linna Darner, internal hemorrhoids, repeat in Sept 2022  . Numbness and tingling in right hand     and whole left side of body-pt sts she has "spells to where she cannot walk or talk"  . Fibromyalgia     sees Dr Philis Kendall at [facility]  . Peripheral neuropathy   . Myasthenia gravis     right sided weakness occ.  . Sleep apnea     no cpap used, was told to get oxygen  to use  . Bile reflux gastritis    Past Surgical History  Procedure Laterality Date  . Nissen fundoplication    . Cholecystectomy    . Abdominal hysterectomy    . Esophageal surgery?      2008 [facility]  . Hand surgery      carpal tumnnel  right and removal of cyst left  . Neck surgery      removal of "knot"t from neck  . Bladder surgery      stretch bladder opening  . Savory dilation  07/18/2011    Procedure: SAVORY DILATION;  Surgeon: Arlyce Harman, MD;  Location: [facility];  Service: Endoscopy;  Laterality: N/Boyd;  with propofol sedation ; #16 savory dilation  . Esophageal manometry N/Boyd 01/21/2013    Procedure: ESOPHAGEAL MANOMETRY (EM);  Surgeon: Valarie Merino, MD;  [facility];  Service: General;  Laterality: N/Boyd;  . Laparoscopic nissen fundoplication N/Boyd 02/13/2013    Procedure: Redo LAPAROSCOPIC NISSEN FUNDOPLICATION;  Surgeon: Valarie Merino, MD;  Location: [facility];  Service: General;  Laterality: N/Boyd;  Forgut explortation with partial  takedown nissan funliplication from 1994 for wrap torsion and persistant dysphagia    Allergies  Allergen Reactions  . Avelox [Moxifloxacin Hcl In Nacl] Anaphylaxis and Itching  . Linzess [Linaclotide] Swelling    Swelling, bloating, nausea.Med used for "IBS"  . Adhesive [Tape] Other (See Comments)    Red and itchy under tape  . Cephalexin Hives and Itching  . Codeine Hives and Itching  . Cymbalta [Duloxetine Hcl] Other (See Comments)    Hair loss, nervousness, severe constipation,  red blotches  . Dicyclomine Itching and Other (See Comments)    Nervousness  . Latex Itching  . Macrolides And Ketolides Swelling  . Meperidine Hcl Itching and Other (See Comments)    Nervousness  . Metronidazole Hives and Other (See Comments)    Red blotches.  . Morphine Itching and Other (See Comments)    Nervousness  . Penicillins Hives  . Prednisone Hives, Swelling and Other (See Comments)    Red blotches - Can take with Benadryl  . Sulfonamide Derivatives Hives  . Tetracycline Other (See Comments)    Reaction unknown  . Tramadol Hcl Hives and Other (See Comments)    Nervousness    Current Outpatient Prescriptions  Medication Sig Dispense Refill  . acetaminophen (TYLENOL) 650  MG CR tablet Take 650 mg by mouth every 8 (eight) hours as needed for pain.       Marland Kitchen albuterol (PROVENTIL HFA;VENTOLIN HFA) 108 (90 BASE) MCG/ACT inhaler Inhale 2 puffs into the lungs every 4 (four) hours as needed for shortness of breath.      Marland Kitchen azelastine (ASTELIN) 137 MCG/SPRAY nasal spray Place 1 spray into the nose 2 (two) times daily.       Marland Kitchen CARAFATE 1 GM/10ML suspension Take 1 g by mouth 4 (four) times daily.       . Cholecalciferol ([employer] VIT D 5000 HIGH-POTENCY) 5000 UNITS capsule Take 5,000 Units by mouth every morning.       Marland Kitchen esomeprazole (NEXIUM) 40 MG capsule Take 40 mg by mouth 2 (two) times daily.       Marland Kitchen estrogens, conjugated, (PREMARIN) 0.625 MG tablet Take 0.625 mg by mouth every morning. Take daily for 21 days then do not take for 7 days.      Marland Kitchen guaiFENesin (MUCINEX) 600 MG 12 hr tablet Take 1,200 mg by mouth 2 (two) times daily.      . hydrOXYzine (VISTARIL) 25 MG capsule Take 25 mg by mouth at bedtime.       Marland Kitchen L-Methylfolate-B6-B12 (METANX PO) Take 1 tablet by mouth at bedtime.       Marland Kitchen levocetirizine (XYZAL) 5 MG tablet Take 5 mg by mouth every morning.       . lubiprostone ([name]) 24 MCG capsule Take 24 mcg by mouth 2 (two) times daily with Boyd meal.    AS NEEDED    . magnesium citrate solution Take 296 mLs by mouth daily as needed. For blockage  3-4 TIMES Boyd WEEK    . PRESCRIPTION MEDICATION Apply 1 application topically every morning. LCD10% with Salicylic Acid 2%. Use as directed for skin and rashes.      . simethicone (GAS-X) 80 MG chewable tablet Chew 80 mg by mouth every 6 (six) hours as needed. For gas      . tiZANidine (ZANAFLEX) 4 MG tablet Take 4 mg by mouth at bedtime.       . triamcinolone (KENALOG) 0.1 % cream Apply 1 application topically 2 (two) times daily as needed. For skin.      . cycloSPORINE (RESTASIS) 0.05 % ophthalmic emulsion Place 1 drop into both eyes 2 (two) times daily as needed.       Marland Kitchen tetrahydrozoline 0.05 % ophthalmic solution Place 1 drop into  both eyes 4 (four) times daily as needed (For eye irritation.).          Review of Systems     Objective:   Physical Exam  Vitals reviewed. Constitutional: She is oriented to person, place, and time.  She appears well-nourished. No distress.  HENT:  Head: Normocephalic and atraumatic.  Mouth/Throat: Oropharynx is clear and moist. No oropharyngeal exudate.  Eyes: Pupils are equal, round, and reactive to light. No scleral icterus.  Neck: Normal range of motion. Neck supple.  Cardiovascular: Normal rate, regular rhythm and normal heart sounds.   Pulmonary/Chest: Effort normal and breath sounds normal. No respiratory distress.  Abdominal: Soft. Bowel sounds are normal. She exhibits no distension. There is tenderness. There is no rebound and no guarding.  MILD TTP IN THE EPIGASTRIUM, INCISIONS WELL HEALED     Musculoskeletal: Normal range of motion. She exhibits no edema.  Lymphadenopathy:    She has no cervical adenopathy.  Neurological: She is alert and oriented to person, place, and time.  NO  NEW FOCAL DEFICITS   Psychiatric:  SLIGHTLY ANXIOUS MOOD, NL AFFECT           Assessment & Plan:

## 2013-09-11 NOTE — Patient Instructions (Addendum)
TO TREAT YOUR RECTAL SPASM & HELP YOUR ANUS RELAX, USE Nitroglycerin ointment 0.125 % Apply a pea sized amount WITH Q-TIP JUST INSIDE OF RECTUM FOUR times a day FOR 3 MOS. OINTMENT CAN CAUSE HEADACHES AND LOW BLOOD PRESSURE.  CONTINUE WEDGE AS NEEDED TO PREVENT REGURGITATION. CONTINUE NEXIUM. TAKE 30 MINUTES BEFORE MEALS TWICE DAILY.  FOLLOW FULL LIQUID DIET FOR 3 DAYS WITH FLARES OF PAIN, NAUSEA, OR VOMITING THEN FOLLOW A LOW FAT/SOFT MECHANICAL DIET.  TRY TO EAT 4 TO 6 SMALL MEALS DAILY.  STAY UPRIGHT 30 MINUTES AFTER EATING. WAIT 3 TO 4 HOURS BEFORE LAYING DOWN AFTER EATING.  FOLLOW UP IN 4 MOS.

## 2013-09-11 NOTE — Assessment & Plan Note (Addendum)
SX FAIRLY WELL CONTROLLED.  ADD NTG OINTMENT TO TREAT RECTAL SPASM/?PELVIC FLOOR DYSFUNCTION. MED SIDE EFFECTS DISCUSSED: HA/LOW BP. CONTINUE [name]. CAATIONED PT TO USE MG CITRATE JUDICIOUSLY. OPV IN 4 MOS

## 2013-09-11 NOTE — Assessment & Plan Note (Signed)
RARE.  CONTINUE TO MONITOR SYMPTOMS. FOLLOW UP IN 4 MOS.

## 2013-09-11 NOTE — Assessment & Plan Note (Signed)
SX FAIRLY WELL CONTROLLED.  CONTINUE WEDGE AS NEEDED TO PREVENT REGURGITATION. CONTINUE NEXIUM. TAKE 30 MINUTES BEFORE MEALS TWICE DAILY.  FOLLOW FULL LIQUID DIET FOR 3 DAYS WITH FLARES OF PAIN, NAUSEA, OR VOMITING THEN FOLLOW A LOW FAT/SOFT MECHANICAL DIET.  TRY TO EAT 4 TO 6 SMALL MEALS DAILY.  STAY UPRIGHT 30 MINUTES AFTER EATING. WAIT 3 TO 4 HOURS BEFORE LAYING DOWN AFTER EATING.  FOLLOW UP IN 6 MOS

## 2013-09-16 NOTE — Progress Notes (Signed)
cc'd to pcp 

## 2013-09-20 ENCOUNTER — Encounter (HOSPITAL_COMMUNITY): Payer: Self-pay | Admitting: Psychology

## 2013-09-20 NOTE — Progress Notes (Signed)
Patient:  Yesenia Boyd   DOB: Nov 27, 1955  MR Number: [id number]  Location: [facility] [address] [place] [address] Dept: [phone]  Start: 4 PM End: 5 PM  Provider/Observer:     Hershal Coria PSYD  Chief Complaint:      Chief Complaint  Patient presents with  . Depression  . Anxiety  . Stress    Reason For Service:     The patient initially presented with issues of depression, anxiety, and adjustment difficulties. She has had a lot of traumatic and stressful events in her life that she has difficulty coping with. The patient has numerous medical issues that can be found in her medical chart. Many these have some components to difficulty with sympathetic arousal and anxiety issues.  Interventions Strategy:  Cognitive/behavioral psychotherapeutic interventions  Participation Level:   Active  Participation Quality:  Appropriate      Behavioral Observation:  Well Groomed, Alert, and Appropriate.   Current Psychosocial Factors: The patient reports continued family stressors with a normal cement of issues related to family members relied on the patient excessively.  Content of Session:   Reviewed current symptoms and continued work on therapeutic interventions for depression and anxiety.  Current Status:   The patient reports that continued psychosocial stressors are having a negative impact on her mood state. Depression and anxiety continue..  Patient Progress:   Very good  Target Goals:   Target goals include reducing the intensity, duration, and frequency of significant episodes of depression and anxiety.  Last Reviewed:   07/19/2013  Goals Addressed Today:    Today we worked on Producer, television/film/video and strategies are in issues of her depression and anxiety.  Impression/Diagnosis:   The patient is a long-standing history of recurrent depression and anxiety. Fibromyalgia  and irritable bowel syndrome are also present. 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  Diagnosis:    Axis I:  Major depression, recurrent  Anxiety state, unspecified      Axis II: No diagnosis

## 2013-09-23 ENCOUNTER — Ambulatory Visit (INDEPENDENT_AMBULATORY_CARE_PROVIDER_SITE_OTHER): Payer: BC Managed Care – PPO | Admitting: Psychology

## 2013-09-23 DIAGNOSIS — F339 Major depressive disorder, recurrent, unspecified: Secondary | ICD-10-CM

## 2013-09-23 DIAGNOSIS — F411 Generalized anxiety disorder: Secondary | ICD-10-CM | POA: Diagnosis not present

## 2013-09-24 NOTE — Progress Notes (Signed)
Patient:  Yesenia Boyd   DOB: 1956-05-02  MR Number: [id number]  Location: BE[facility]AVIORAL [facility] [address] [place] [address] Dept: [phone]  Start: 3 PM End: 4 PM  Provider/Observer:     Hershal Coria PSYD  Chief Complaint:      Chief Complaint  Patient presents with  . Depression  . Anxiety  . Stress    Reason For Service:     The patient initially presented with issues of depression, anxiety, and adjustment difficulties. She has had a lot of traumatic and stressful events in her life that she has difficulty coping with. The patient has numerous medical issues that can be found in her medical chart. Many these have some components to difficulty with sympathetic arousal and anxiety issues.  Interventions Strategy:  Cognitive/behavioral psychotherapeutic interventions  Participation Level:   Active  Participation Quality:  Appropriate      Behavioral Observation:  Well Groomed, Alert, and Appropriate.   Current Psychosocial Factors: The patient reports that she has been doing much better with keeping her family in as far as their of or expecting and demanding of her help and attention. The patient reports this has helped to some degree and she did not\that she is expecting check .  Content of Session:   Reviewed current symptoms and continued work on therapeutic interventions for depression and anxiety.  Current Status:   the patient reports that her mood has improved to some degree recently and she's been actively working on coping skills we have been developing.  Patient Progress:   Very good  Target Goals:   Target goals include reducing the intensity, duration, and frequency of significant episodes of depression and anxiety.  Last Reviewed:   09/24/2013  Goals Addressed Today:    Today we worked on Producer, television/film/video and strategies are in issues of her depression and  anxiety.  Impression/Diagnosis:   The patient is a long-standing history of recurrent depression and anxiety. Fibromyalgia and irritable bowel syndrome are also present. 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  Diagnosis:    Axis I:  Major depression, recurrent  Anxiety state, unspecified      Axis II: No diagnosis

## 2013-10-01 ENCOUNTER — Encounter (HOSPITAL_COMMUNITY): Payer: Self-pay | Admitting: Psychology

## 2013-10-01 NOTE — Progress Notes (Signed)
Patient:  Yesenia Boyd   DOB: 08-Dec-1955  MR Number: [id number]  Location: BEHAVIORAL [facility] [address] [place] [address] Dept: [phone]  Start: 4 PM End: 5 PM  Provider/Observer:     Hershal Coria PSYD  Chief Complaint:      Chief Complaint  Patient presents with  . Stress  . Agitation  . Trauma    Reason For Service:     The patient initially presented with issues of depression, anxiety, and adjustment difficulties. She has had a lot of traumatic and stressful events in her life that she has difficulty coping with. The patient has numerous medical issues that can be found in her medical chart. Many these have some components to difficulty with sympathetic arousal and anxiety issues.  Interventions Strategy:  Cognitive/behavioral psychotherapeutic interventions  Participation Level:   Active  Participation Quality:  Appropriate      Behavioral Observation:  Well Groomed, Alert, and Appropriate.   Current Psychosocial Factors: The patient reports continued family stressors with a normal cement of issues related to family members relied on the patient excessively.  Content of Session:   Reviewed current symptoms and continued work on therapeutic interventions for depression and anxiety.  Current Status:   The patient reports that continued psychosocial stressors are having a negative impact on her mood state. Depression and anxiety continue..  Patient Progress:   Very good  Target Goals:   Target goals include reducing the intensity, duration, and frequency of significant episodes of depression and anxiety.  Last Reviewed:   10/13 2014  Goals Addressed Today:    Today we worked on Producer, television/film/video and strategies are in issues of her depression and anxiety.  Impression/Diagnosis:   The patient is a long-standing history of recurrent depression and anxiety. Fibromyalgia and  irritable bowel syndrome are also present. 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  Diagnosis:    Axis I:  Major depression, recurrent  Anxiety state, unspecified      Axis II: No diagnosis

## 2013-10-16 ENCOUNTER — Ambulatory Visit (INDEPENDENT_AMBULATORY_CARE_PROVIDER_SITE_OTHER): Payer: BC Managed Care – PPO | Admitting: Psychology

## 2013-10-16 DIAGNOSIS — F339 Major depressive disorder, recurrent, unspecified: Secondary | ICD-10-CM | POA: Diagnosis not present

## 2013-10-16 DIAGNOSIS — F411 Generalized anxiety disorder: Secondary | ICD-10-CM

## 2013-11-15 ENCOUNTER — Encounter (HOSPITAL_COMMUNITY): Payer: Self-pay | Admitting: Psychology

## 2013-11-15 ENCOUNTER — Ambulatory Visit (INDEPENDENT_AMBULATORY_CARE_PROVIDER_SITE_OTHER): Payer: BC Managed Care – PPO | Admitting: Psychology

## 2013-11-15 DIAGNOSIS — F339 Major depressive disorder, recurrent, unspecified: Secondary | ICD-10-CM | POA: Diagnosis not present

## 2013-11-15 DIAGNOSIS — F411 Generalized anxiety disorder: Secondary | ICD-10-CM | POA: Diagnosis not present

## 2013-11-15 NOTE — Progress Notes (Signed)
Patient:  Yesenia Boyd   DOB: 10/12/1956  MR Number: [id number]  Location: BEHAVIORAL [facility] [address] [place] [address] Dept: [phone]  Start: 3 PM End: 4 PM  Provider/Observer:     Hershal CoriaJohn R Nabria Nevin PSYD  Chief Complaint:      Chief Complaint  Patient presents with  . Anxiety  . Depression    Reason For Service:     The patient initially presented with issues of depression, anxiety, and adjustment difficulties. She has had a lot of traumatic and stressful events in her life that she has difficulty coping with. The patient has numerous medical issues that can be found in her medical chart. Many these have some components to difficulty with sympathetic arousal and anxiety issues.  Interventions Strategy:  Cognitive/behavioral psychotherapeutic interventions  Participation Level:   Active  Participation Quality:  Appropriate      Behavioral Observation:  Well Groomed, Alert, and Appropriate.   Current Psychosocial Factors: The patient reports that she is still having a lot of stress with her family and the fact that they are.  Content of Session:   Reviewed current symptoms and continued work on therapeutic interventions for depression and anxiety.  Current Status:   the patient reports that her mood has improved to some degree recently and she's been actively working on coping skills we have been developing.  Patient Progress:   Very good  Target Goals:   Target goals include reducing the intensity, duration, and frequency of significant episodes of depression and anxiety.  Last Reviewed:   10/16/2013  Goals Addressed Today:    Today we worked on Producer, television/film/videobuilding coping skills and strategies are in issues of her depression and anxiety.  Impression/Diagnosis:   The patient is a long-standing history of recurrent depression and anxiety. Fibromyalgia and irritable bowel syndrome are also  present. 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  Diagnosis:    Axis I:  Major depression, recurrent  Anxiety state, unspecified      Axis II: No diagnosis

## 2013-11-15 NOTE — Progress Notes (Signed)
Patient:  Yesenia Boyd   DOB: 12/02/1955  MR Number: [id number]  Location: [facility]AVIOR[facility] [address] [place] [address] Dept: [phone]  Start: 3 PM End: 4 PM  Provider/Observer:     Hershal CoriaJohn R Rodenbough PSYD  Chief Complaint:      Chief Complaint  Patient presents with  . Depression  . Anxiety    Reason For Service:     The patient initially presented with issues of depression, anxiety, and adjustment difficulties. She has had a lot of traumatic and stressful events in her life that she has difficulty coping with. The patient has numerous medical issues that can be found in her medical chart. Many these have some components to difficulty with sympathetic arousal and anxiety issues.  Interventions Strategy:  Cognitive/behavioral psychotherapeutic interventions  Participation Level:   Active  Participation Quality:  Appropriate      Behavioral Observation:  Well Groomed, Alert, and Appropriate.   Current Psychosocial Factors: The patient reports that the issues with her and her husband have been improving and there level of intimacy has improved some but is still a work in progress.   The patient reports that she is still having issues with other family members.  Content of Session:   Reviewed current symptoms and continued work on therapeutic interventions for depression and anxiety.  Current Status:   the patient reports that her mood has improved to some degree recently and she's been actively working on coping skills we have been developing.  Patient Progress:   Very good  Target Goals:   Target goals include reducing the intensity, duration, and frequency of significant episodes of depression and anxiety.  Last Reviewed:   11/15/2013  Goals Addressed Today:    Today we worked on Producer, television/film/videobuilding coping skills and strategies are in issues of her depression and  anxiety.  Impression/Diagnosis:   The patient is a long-standing history of recurrent depression and anxiety. Fibromyalgia and irritable bowel syndrome are also present. 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  Diagnosis:    Axis I:  Major depression, recurrent  Anxiety state, unspecified      Axis II: No diagnosis

## 2013-11-22 DIAGNOSIS — S139XXA Sprain of joints and ligaments of unspecified parts of neck, initial encounter: Secondary | ICD-10-CM | POA: Diagnosis not present

## 2013-11-22 DIAGNOSIS — Z6825 Body mass index (BMI) 25.0-25.9, adult: Secondary | ICD-10-CM | POA: Diagnosis not present

## 2013-12-13 ENCOUNTER — Ambulatory Visit (INDEPENDENT_AMBULATORY_CARE_PROVIDER_SITE_OTHER): Payer: BC Managed Care – PPO | Admitting: Psychology

## 2013-12-13 DIAGNOSIS — F411 Generalized anxiety disorder: Secondary | ICD-10-CM | POA: Diagnosis not present

## 2013-12-13 DIAGNOSIS — F339 Major depressive disorder, recurrent, unspecified: Secondary | ICD-10-CM

## 2013-12-25 ENCOUNTER — Encounter (HOSPITAL_COMMUNITY): Payer: Self-pay | Admitting: Psychology

## 2013-12-25 NOTE — Progress Notes (Signed)
Patient:  Yesenia Boyd   DOB: 07/24/1956  MR Number: [id number]  Location: [facility]AVIORAL [facility] [address] [place] [address] Dept: [phone]  Start: 3 PM End: 4 PM  Provider/Observer:     Hershal CoriaJohn R Taj Arteaga PSYD  Chief Complaint:      Chief Complaint  Patient presents with  . Anxiety  . Stress    Reason For Service:     The patient initially presented with issues of depression, anxiety, and adjustment difficulties. She has had a lot of traumatic and stressful events in her life that she has difficulty coping with. The patient has numerous medical issues that can be found in her medical chart. Many these have some components to difficulty with sympathetic arousal and anxiety issues.  Interventions Strategy:  Cognitive/behavioral psychotherapeutic interventions  Participation Level:   Active  Participation Quality:  Appropriate      Behavioral Observation:  Well Groomed, Alert, and Appropriate.   Current Psychosocial Factors: The patient reports that the issues with her and her husband have been improving and there level of intimacy has improved some but is still a work in progress.   The patient reports that she is still having issues with other family members.  Content of Session:   Reviewed current symptoms and continued work on therapeutic interventions for depression and anxiety.  Current Status:   the patient reports that her mood has improved to some degree recently and she's been actively working on coping skills we have been developing.  Patient Progress:   Very good  Target Goals:   Target goals include reducing the intensity, duration, and frequency of significant episodes of depression and anxiety.  Last Reviewed:   12/13/2013  Goals Addressed Today:    Today we worked on Producer, television/film/videobuilding coping skills and strategies are in issues of her depression and  anxiety.  Impression/Diagnosis:   The patient is a long-standing history of recurrent depression and anxiety. Fibromyalgia and irritable bowel syndrome are also present. 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  Diagnosis:    Axis I:  Major depression, recurrent  Anxiety state, unspecified      Axis II: No diagnosis

## 2014-01-10 ENCOUNTER — Ambulatory Visit (INDEPENDENT_AMBULATORY_CARE_PROVIDER_SITE_OTHER): Payer: BC Managed Care – PPO | Admitting: Psychology

## 2014-01-10 DIAGNOSIS — F339 Major depressive disorder, recurrent, unspecified: Secondary | ICD-10-CM | POA: Diagnosis not present

## 2014-01-10 DIAGNOSIS — F411 Generalized anxiety disorder: Secondary | ICD-10-CM | POA: Diagnosis not present

## 2014-02-04 ENCOUNTER — Encounter (HOSPITAL_COMMUNITY): Payer: Self-pay | Admitting: Psychology

## 2014-02-04 NOTE — Progress Notes (Signed)
Patient:  Yesenia Boyd   DOB: 09/16/1956  MR Number: [id number]  Location: [facility] [address] [place] [address] Dept: [phone]  Start: 3 PM End: 4 PM  Provider/Observer:     Hershal CoriaJohn R Rodenbough PSYD  Chief Complaint:      Chief Complaint  Patient presents with  . Anxiety  . Depression    Reason For Service:     The patient initially presented with issues of depression, anxiety, and adjustment difficulties. She has had a lot of traumatic and stressful events in her life that she has difficulty coping with. The patient has numerous medical issues that can be found in her medical chart. Many these have some components to difficulty with sympathetic arousal and anxiety issues.  Interventions Strategy:  Cognitive/behavioral psychotherapeutic interventions  Participation Level:   Active  Participation Quality:  Appropriate      Behavioral Observation:  Well Groomed, Alert, and Appropriate.   Current Psychosocial Factors: The patient reports that she has been doing much better with regard to her relationship with her family. She has been keeping them in today as far as asking her to do too much.  Content of Session:   Reviewed current symptoms and continued work on therapeutic interventions for depression and anxiety.  Current Status:   the patient reports that her mood has improved to some degree recently and she's been actively working on coping skills we have been developing.  Patient Progress:   Very good  Target Goals:   Target goals include reducing the intensity, duration, and frequency of significant episodes of depression and anxiety.  Last Reviewed:   01/10/2014  Goals Addressed Today:    Today we worked on Producer, television/film/videobuilding coping skills and strategies are in issues of her depression and anxiety.  Impression/Diagnosis:   The patient is a long-standing history of recurrent  depression and anxiety. Fibromyalgia and irritable bowel syndrome are also present. 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  Diagnosis:    Axis I:  Major depression, recurrent  Anxiety state, unspecified      Axis II: No diagnosis

## 2014-02-05 DIAGNOSIS — H109 Unspecified conjunctivitis: Secondary | ICD-10-CM | POA: Diagnosis not present

## 2014-02-05 DIAGNOSIS — Z6826 Body mass index (BMI) 26.0-26.9, adult: Secondary | ICD-10-CM | POA: Diagnosis not present

## 2014-02-07 ENCOUNTER — Ambulatory Visit (HOSPITAL_COMMUNITY): Payer: Self-pay | Admitting: Psychology

## 2014-02-11 ENCOUNTER — Encounter: Payer: Self-pay | Admitting: Gastroenterology

## 2014-02-17 ENCOUNTER — Encounter (HOSPITAL_COMMUNITY): Payer: Self-pay | Admitting: Psychology

## 2014-02-17 ENCOUNTER — Ambulatory Visit (INDEPENDENT_AMBULATORY_CARE_PROVIDER_SITE_OTHER): Payer: BC Managed Care – PPO | Admitting: Psychology

## 2014-02-17 DIAGNOSIS — F411 Generalized anxiety disorder: Secondary | ICD-10-CM

## 2014-02-17 DIAGNOSIS — F339 Major depressive disorder, recurrent, unspecified: Secondary | ICD-10-CM | POA: Diagnosis not present

## 2014-02-17 NOTE — Progress Notes (Signed)
    PROGRESS NOTE  Patient:  Yesenia Boyd   DOB: 08/23/1956  MR Number: [id number]  Location: [facility] [address] [place] [address] Dept: [phone]  Start: 3 PM End: 4 PM  Provider/Observer:     Hershal CoriaJohn R Rodenbough PSYD  Chief Complaint:      Chief Complaint  Patient presents with  . Depression  . Anxiety  . Stress    Reason For Service:     The patient initially presented with issues of depression, anxiety, and adjustment difficulties. She has had a lot of traumatic and stressful events in her life that she has difficulty coping with. The patient has numerous medical issues that can be found in her medical chart. Many these have some components to difficulty with sympathetic arousal and anxiety issues.  Interventions Strategy:  Cognitive/behavioral psychotherapeutic interventions  Participation Level:   Active  Participation Quality:  Appropriate      Behavioral Observation:  Well Groomed, Alert, and Appropriate.   Current Psychosocial Factors: The patient reports that she has been trying to spend more time with family but be able to not take on responsibilities of their lives even if they are trying to push her to help them out is some ways.  Content of Session:   Reviewed current symptoms and continued work on therapeutic interventions for depression and anxiety.  Current Status:   the patient reports that her mood continues to be better but as she has been doing better she has had more physical issues and then feels guilty about being better and others in her life not doing well.  Patient Progress:   Very good  Target Goals:   Target goals include reducing the intensity, duration, and frequency of significant episodes of depression and anxiety.  Last Reviewed:   02/17/2014  Goals Addressed Today:    Today we worked on Producer, television/film/videobuilding coping skills and strategies are in issues  of her depression and anxiety.  Impression/Diagnosis:   The patient is a long-standing history of recurrent depression and anxiety. Fibromyalgia and irritable bowel syndrome are also present. 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  Diagnosis:    Axis I:  Major depression, recurrent  Anxiety state, unspecified      Axis II: No diagnosis     RODENBOUGH,JOHN R, PsyD 02/17/2014

## 2014-03-17 ENCOUNTER — Ambulatory Visit (INDEPENDENT_AMBULATORY_CARE_PROVIDER_SITE_OTHER): Payer: BC Managed Care – PPO | Admitting: Psychology

## 2014-03-17 ENCOUNTER — Encounter (HOSPITAL_COMMUNITY): Payer: Self-pay | Admitting: Psychology

## 2014-03-17 DIAGNOSIS — F411 Generalized anxiety disorder: Secondary | ICD-10-CM | POA: Diagnosis not present

## 2014-03-17 DIAGNOSIS — F339 Major depressive disorder, recurrent, unspecified: Secondary | ICD-10-CM

## 2014-03-17 NOTE — Progress Notes (Signed)
    PROGRESS NOTE  Patient:  Yesenia Boyd   DOB: 08/10/1956  MR Number: [id number]  Location: BE[facility]AVIORAL [facility] [address] [place] [address] Dept: [phone]  Start: 4 PM End: 5 PM  Provider/Observer:     Hershal CoriaJohn R Horton Ellithorpe PSYD  Chief Complaint:      Chief Complaint  Patient presents with  . Anxiety  . Depression    Reason For Service:     The patient initially presented with issues of depression, anxiety, and adjustment difficulties. She has had a lot of traumatic and stressful events in her life that she has difficulty coping with. The patient has numerous medical issues that can be found in her medical chart. Many these have some components to difficulty with sympathetic arousal and anxiety issues.  Interventions Strategy:  Cognitive/behavioral psychotherapeutic interventions  Participation Level:   Active  Participation Quality:  Appropriate      Behavioral Observation:  Well Groomed, Alert, and Appropriate.   Current Psychosocial Factors: The patient reports that she has continued to have trouble with her family and the stress they create.  The patient reports taht things are going better with her husband and they are closer now.  Content of Session:   Reviewed current symptoms and continued work on therapeutic interventions for depression and anxiety.  Current Status:   the patient reports that her mood continues to be better overall, but that family stress continues to push her anxiety and worry leads to depressive symptoms.  Patient Progress:   Very good  Target Goals:   Target goals include reducing the intensity, duration, and frequency of significant episodes of depression and anxiety.  Last Reviewed:   02/17/2014  Goals Addressed Today:    Today we worked on Producer, television/film/videobuilding coping skills and strategies are in issues of her depression and anxiety.  Impression/Diagnosis:   The  patient is a long-standing history of recurrent depression and anxiety. Fibromyalgia and irritable bowel syndrome are also present. 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  Diagnosis:    Axis I:  Major depression, recurrent  Anxiety state, unspecified      Axis II: No diagnosis     Tashayla Therien R, PsyD 03/17/2014

## 2014-03-22 DIAGNOSIS — J45909 Unspecified asthma, uncomplicated: Secondary | ICD-10-CM | POA: Diagnosis not present

## 2014-03-22 DIAGNOSIS — Z79899 Other long term (current) drug therapy: Secondary | ICD-10-CM | POA: Diagnosis not present

## 2014-03-22 DIAGNOSIS — K219 Gastro-esophageal reflux disease without esophagitis: Secondary | ICD-10-CM | POA: Diagnosis not present

## 2014-03-22 DIAGNOSIS — J309 Allergic rhinitis, unspecified: Secondary | ICD-10-CM | POA: Diagnosis not present

## 2014-03-22 DIAGNOSIS — E559 Vitamin D deficiency, unspecified: Secondary | ICD-10-CM | POA: Diagnosis not present

## 2014-03-22 DIAGNOSIS — J069 Acute upper respiratory infection, unspecified: Secondary | ICD-10-CM | POA: Diagnosis not present

## 2014-03-26 ENCOUNTER — Encounter: Payer: Self-pay | Admitting: Gastroenterology

## 2014-03-26 ENCOUNTER — Encounter (INDEPENDENT_AMBULATORY_CARE_PROVIDER_SITE_OTHER): Payer: Self-pay

## 2014-03-26 ENCOUNTER — Ambulatory Visit (INDEPENDENT_AMBULATORY_CARE_PROVIDER_SITE_OTHER): Payer: BC Managed Care – PPO | Admitting: Gastroenterology

## 2014-03-26 VITALS — BP 107/67 | HR 70 | Temp 98.2°F | Ht 63.0 in | Wt 147.4 lb

## 2014-03-26 DIAGNOSIS — R109 Unspecified abdominal pain: Secondary | ICD-10-CM

## 2014-03-26 DIAGNOSIS — K219 Gastro-esophageal reflux disease without esophagitis: Secondary | ICD-10-CM

## 2014-03-26 DIAGNOSIS — K589 Irritable bowel syndrome without diarrhea: Secondary | ICD-10-CM | POA: Diagnosis not present

## 2014-03-26 NOTE — Assessment & Plan Note (Signed)
Continue [name] BID.

## 2014-03-26 NOTE — Assessment & Plan Note (Signed)
Continue Nexium BID.  

## 2014-03-26 NOTE — Assessment & Plan Note (Signed)
History of chronic abdominal pain, with worsening of symptoms over past few months. Burning sensation epigastric region. No CT imaging on file. Last EGD in 2012 with mild gastritis; doubt repeat EGD needed at this time unless evidence of dysphagia or melena. Hgb appears at baseline. Will proceed with CT for evaluation of occult issues; physical exam without acute findings. Will continue Nexium BID as this seems to work best out of the PPIs. May ultimately need referral for Pain Management. Differentials include gastritis, non-ulcer dyspepsia, IBS-C, adhesive disease, doubt pancreatitis or occult malignancy.

## 2014-03-26 NOTE — Patient Instructions (Signed)
I have provided the paperwork to take to Dr. Olena LeatherwoodHasanaj so that he is aware of our plan.  We have scheduled a CT scan for you for further evaluation. You may need to see Pain Management in the future if the CT is unrevealing.   For your lymph node concerns, I recommend follow-up with Dr. Olena LeatherwoodHasanaj.

## 2014-03-26 NOTE — Progress Notes (Signed)
Referring Provider: Toma DeitersHasanaj, Xaje A, MD Primary Care Physician:  Toma DeitersHASANAJ,XAJE A, MD Primary GI: Dr. Darrick PennaFields   Chief Complaint  Patient presents with  . Follow-up    HPI:   Yesenia Boyd presents today in follow-up with a history of GERD, constipation, chronic abdominal pain. Feels sore in epigastric region. States she has episodes where she has swollen lymph nodes in her neck, feels sore cervical area. Felt a knot in posterior cervical chain. Feels freezing. Upper abdomen cramping at times. Feels like epigastric area is burning.Worse in last few months. Feels like she needs something to cool it down. Nexium BID. Feels like this helps some with reflux symptoms. Taking [name] 24 mcg BID. Causes cramps and nausea but still wants to take it. Takes Mag Citrate prn. No melena. Feels like food sits heavy in epigastric region.   5/1 outside labs: Hgb 11.1, Hct 33.3, Platelets 221, microcytic.   Past Medical History  Diagnosis Date  . IBS (irritable bowel syndrome)   . S/P endoscopy 2008, Aug 2010, Sept 2012    Dr. Linna DarnerAnwar 2008: removal of impacted food bolus, Dr. Linna DarnerAnwar 2010: moderate gastritis, Sept 2012 with SLF: path with mild gastritis, no definite stricture noted, dilation with Savary 16 mm  . S/P colonoscopy April 2009, Sept 2012    normal Dr. Linna DarnerAnwar, internal hemorrhoids, repeat in Sept 2022  . Numbness and tingling in right hand     and whole left side of body-pt sts she has "spells to where she cannot walk or talk"  . Fibromyalgia     [facility]  . Peripheral neuropathy   . Myasthenia gravis     right sided weakness occ.  . Sleep apnea     no cpap used, was told to get oxygen  to use  . Bile reflux gastritis     Past Surgical History  Procedure Laterality Date  . Nissen fundoplication    . Cholecystectomy    . Abdominal hysterectomy    . Esophageal surgery?      2008 [facility]  . Hand surgery      carpal tumnnel right and removal of cyst left  . Neck surgery      removal of "knot"t from neck  . Bladder surgery      stretch bladder opening  . Savory dilation  07/18/2011    Procedure: SAVORY DILATION;  Surgeon: Arlyce HarmanSandi M Fields, MD;  Location: [facility];  Service: Endoscopy;  Laterality: N/A;  with propofol sedation ; #16 savory dilation  . Esophageal manometry N/A 01/21/2013    Procedure: ESOPHAGEAL MANOMETRY (EM);  Surgeon: Valarie MerinoMatthew B Martin, MD;  Location: [facility];  Service: General;  Laterality: N/A;  . Laparoscopic nissen fundoplication N/A 02/13/2013    Procedure: Redo LAPAROSCOPIC NISSEN FUNDOPLICATION;  Surgeon: Valarie MerinoMatthew B Martin, MD;  Location: [facility];  Service: General;  Laterality: N/A;  Forgut explortation with partial  takedown nissan funliplication from 1994 for wrap torsion and persistant dysphagia    Current Outpatient Prescriptions  Medication Sig Dispense Refill  . acetaminophen (TYLENOL) 650 MG CR tablet Take 650 mg by mouth every 8 (eight) hours as needed for pain.       Marland Kitchen. albuterol (PROVENTIL HFA;VENTOLIN HFA) 108 (90 BASE) MCG/ACT inhaler Inhale 2 puffs into the lungs every 4 (four) hours as needed for shortness of breath.      Marland Kitchen. azelastine (ASTELIN) 137 MCG/SPRAY nasal spray Place 1 spray into the nose 2 (two) times daily.       .Marland Kitchen  CARAFATE 1 GM/10ML suspension Take 1 g by mouth 4 (four) times daily.       . Cholecalciferol ([employer] VIT D 5000 HIGH-POTENCY) 5000 UNITS capsule Take 5,000 Units by mouth every morning.       . clindamycin (CLEOCIN T) 1 % lotion Apply topically 2 (two) times daily.       . cycloSPORINE (RESTASIS) 0.05 % ophthalmic emulsion Place 1 drop into both eyes 2 (two) times daily as needed.       Marland Kitchen. esomeprazole (NEXIUM) 40 MG capsule Take 40 mg by mouth 2 (two) times daily.       Marland Kitchen. estrogens, conjugated, (PREMARIN) 0.625 MG tablet Take 0.625 mg by mouth every morning. Take daily for 21 days then do not take for 7 days.      Marland Kitchen. guaiFENesin (MUCINEX) 600 MG 12 hr tablet Take 1,200 mg by mouth 2 (two) times daily.      .  hydrOXYzine (VISTARIL) 25 MG capsule Take 25 mg by mouth at bedtime.       Marland Kitchen. L-Methylfolate-B6-B12 (METANX PO) Take 1 tablet by mouth at bedtime.       Marland Kitchen. levocetirizine (XYZAL) 5 MG tablet Take 5 mg by mouth every morning.       . lubiprostone ([name]) 24 MCG capsule Take 24 mcg by mouth 2 (two) times daily with a meal.        . magnesium citrate solution Take 296 mLs by mouth daily as needed. For blockage      . PRESCRIPTION MEDICATION Apply 1 application topically every morning. LCD10% with Salicylic Acid 2%. Use as directed for skin and rashes.      . simethicone (GAS-X) 80 MG chewable tablet Chew 80 mg by mouth every 6 (six) hours as needed. For gas      . tetrahydrozoline 0.05 % ophthalmic solution Place 1 drop into both eyes 4 (four) times daily as needed (For eye irritation.).      Marland Kitchen. tiZANidine (ZANAFLEX) 4 MG tablet Take 4 mg by mouth at bedtime.       . triamcinolone (KENALOG) 0.1 % cream Apply 1 application topically 2 (two) times daily as needed. For skin.      Marland Kitchen. vitamin E 1000 UNIT capsule Take 1,000 Units by mouth daily.       No current facility-administered medications for this visit.    Allergies as of 03/26/2014 - Review Complete 03/26/2014  Allergen Reaction Noted  . Avelox [moxifloxacin hcl in nacl] Anaphylaxis and Itching 06/20/2011  . Linzess [linaclotide] Swelling 02/04/2013  . Adhesive [tape] Other (See Comments) 01/24/2013  . Cephalexin Hives and Itching   . Codeine Hives and Itching   . Cymbalta [duloxetine hcl] Other (See Comments) 06/20/2011  . Dicyclomine Itching and Other (See Comments) 06/20/2011  . Latex Itching 01/24/2013  . Macrolides and ketolides Swelling 07/18/2011  . Meperidine hcl Itching and Other (See Comments)   . Metronidazole Hives and Other (See Comments) 06/20/2011  . Morphine Itching and Other (See Comments)   . Penicillins Hives   . Prednisone Hives, Swelling, and Other (See Comments) 06/20/2011  . Sulfonamide derivatives Hives   .  Tetracycline Other (See Comments)   . Tramadol hcl Hives and Other (See Comments)     Family History  Problem Relation Age of Onset  . Colon cancer Cousin     4 cousins  . Anesthesia problems Sister   . Cancer Sister     breast  . Hypertension Sister   . Pseudochol  deficiency Neg Hx   . Hypotension Neg Hx   . Malignant hyperthermia Neg Hx   . Parkinson's disease Mother   . Kidney disease Mother   . Diabetes Mother   . Heart attack Father   . Cancer Father     stomach cancer  . Hypertension Brother   . Diabetes Brother   . Arthritis Brother   . Kidney disease Brother   . Parkinson's disease Brother   . Cancer Maternal Aunt     breast  . Cancer Maternal Grandmother     breast  . Multiple sclerosis Daughter     History   Social History  . Marital Status: Married    Spouse Name: N/A    Number of Children: N/A  . Years of Education: N/A   Social History Main Topics  . Smoking status: Never Smoker   . Smokeless tobacco: Never Used  . Alcohol Use: Yes     Comment: drinks wine "every blue moon"  . Drug Use: No  . Sexual Activity: Not Currently    Birth Control/ Protection: Surgical   Other Topics Concern  . None   Social History Narrative  . None    Review of Systems: As mentioned in HPI.   Physical Exam: BP 107/67  Pulse 70  Temp(Src) 98.2 F (36.8 C) (Oral)  Ht 5\' 3"  (1.6 m)  Wt 147 lb 6.4 oz (66.86 kg)  BMI 26.12 kg/m2 General:   Alert and oriented. No distress noted. Pleasant and cooperative.  Head:  Normocephalic and atraumatic. Eyes:  Conjuctiva clear without scleral icterus. Mouth:  Oral mucosa pink and moist.  Neck:  Supple, left submandibular, small, pea-sized lymph node TTP Abdomen:  +BS, soft, non-tender and non-distended. No rebound or guarding. No HSM or masses noted. Msk:  Symmetrical without gross deformities. Normal posture. Extremities:  Without edema. Neurologic:  Alert and  oriented x4;  grossly normal neurologically. Skin:   Intact without significant lesions or rashes. Psych:  Alert and cooperative. Normal mood and affect.

## 2014-04-02 NOTE — Progress Notes (Signed)
cc'd to pcp 

## 2014-04-03 ENCOUNTER — Telehealth: Payer: Self-pay | Admitting: Gastroenterology

## 2014-04-03 NOTE — Telephone Encounter (Signed)
CT denied by insurance. I called [phone] per letter recommendations and have requested a Provider Courtesy Review. Nira Retort, ANP-BC [facility]

## 2014-04-10 NOTE — Telephone Encounter (Signed)
CT abd/pel is approved pac # [id number] It was scheduled for Friday June 5 at 11:00, I called her to inform her and she is stated she does NOT want to have it done now, her PCP has advised against the Dye and her allergies. So CT has been cancelled

## 2014-04-10 NOTE — Telephone Encounter (Signed)
Reminder in epic °

## 2014-04-10 NOTE — Telephone Encounter (Signed)
Noted. I do not see where she has a dye allergy.  Let's offer her a routine follow-up in about 2-3 months.

## 2014-04-11 ENCOUNTER — Other Ambulatory Visit (HOSPITAL_COMMUNITY): Payer: Self-pay

## 2014-04-15 ENCOUNTER — Ambulatory Visit (INDEPENDENT_AMBULATORY_CARE_PROVIDER_SITE_OTHER): Payer: BC Managed Care – PPO | Admitting: Psychology

## 2014-04-15 DIAGNOSIS — F411 Generalized anxiety disorder: Secondary | ICD-10-CM | POA: Diagnosis not present

## 2014-04-15 DIAGNOSIS — F339 Major depressive disorder, recurrent, unspecified: Secondary | ICD-10-CM | POA: Diagnosis not present

## 2014-04-16 ENCOUNTER — Encounter (HOSPITAL_COMMUNITY): Payer: Self-pay | Admitting: Psychology

## 2014-04-16 NOTE — Progress Notes (Signed)
    PROGRESS NOTE  Patient:  Yesenia Boyd   DOB: [birth year]  MR Number: [id number]  Location: BEHAVIORAL [facility] [address] [place] [address] Dept: [phone]  Start: 4 PM End: 5 PM  Provider/Observer:     Hershal Coria PSYD  Chief Complaint:      Chief Complaint  Patient presents with  . Anxiety  . Depression    Reason For Service:     The patient initially presented with issues of depression, anxiety, and adjustment difficulties. She has had a lot of traumatic and stressful events in her life that she has difficulty coping with. The patient has numerous medical issues that can be found in her medical chart. Many these have some components to difficulty with sympathetic arousal and anxiety issues.  Interventions Strategy:  Cognitive/behavioral psychotherapeutic interventions  Participation Level:   Active  Participation Quality:  Appropriate      Behavioral Observation:  Well Groomed, Alert, and Appropriate.   Current Psychosocial Factors: The patient reports she had some medical issues that resulted in her going into the emergency department. She apparently had an allergic reaction like symptoms is something that they used there. However, one of the attendings realized the concern about potentially using epinephrine issues are he had Benadryl and was concerned that maybe helped if she can just be calmed down. He spent time trying to calm her down and her symptoms improved. The patient reports that this was a learning experience for her and realized that some of her reactions may be exacerbated by her emotional state..  Content of Session:   Reviewed current symptoms and continued work on therapeutic interventions for depression and anxiety.  Current Status:   the patient reports that her mood continues to be better overall, but that family stress continues to push her anxiety and worry  leads to depressive symptoms.  Patient Progress:   Very good  Target Goals:   Target goals include reducing the intensity, duration, and frequency of significant episodes of depression and anxiety.  Last Reviewed:   04/15/2014  Goals Addressed Today:    Today we worked on Producer, television/film/video and strategies are in issues of her depression and anxiety.  Impression/Diagnosis:   The patient is a long-standing history of recurrent depression and anxiety. Fibromyalgia and irritable bowel syndrome are also present. 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  Diagnosis:    Axis I:  Major depression, recurrent  Anxiety state, unspecified      Axis II: No diagnosis     RODENBOUGH,JOHN R, PsyD 04/16/2014

## 2014-05-08 DIAGNOSIS — Z6825 Body mass index (BMI) 25.0-25.9, adult: Secondary | ICD-10-CM | POA: Diagnosis not present

## 2014-05-08 DIAGNOSIS — M542 Cervicalgia: Secondary | ICD-10-CM | POA: Diagnosis not present

## 2014-05-23 ENCOUNTER — Ambulatory Visit (INDEPENDENT_AMBULATORY_CARE_PROVIDER_SITE_OTHER): Payer: BC Managed Care – PPO | Admitting: Psychology

## 2014-05-23 DIAGNOSIS — F331 Major depressive disorder, recurrent, moderate: Secondary | ICD-10-CM

## 2014-05-23 DIAGNOSIS — F411 Generalized anxiety disorder: Secondary | ICD-10-CM

## 2014-06-09 DIAGNOSIS — J0110 Acute frontal sinusitis, unspecified: Secondary | ICD-10-CM | POA: Diagnosis not present

## 2014-06-09 DIAGNOSIS — Z6826 Body mass index (BMI) 26.0-26.9, adult: Secondary | ICD-10-CM | POA: Diagnosis not present

## 2014-06-09 DIAGNOSIS — M542 Cervicalgia: Secondary | ICD-10-CM | POA: Diagnosis not present

## 2014-06-18 ENCOUNTER — Encounter (HOSPITAL_COMMUNITY): Payer: Self-pay | Admitting: Psychology

## 2014-06-18 NOTE — Progress Notes (Signed)
    PROGRESS NOTE  Patient:  Yesenia Boyd   DOB: 05/20/1956  MR Number: [id number]  [facility]: [facility] [address] [place] [address] Dept: [phone]  Start: 4 PM End: 5 PM  Provider/Observer:     Hershal CoriaJohn R Shayden Bobier PSYD  Chief Complaint:      Chief Complaint  Patient presents with  . Anxiety  . Depression    Reason For Service:     The patient initially presented with issues of depression, anxiety, and adjustment difficulties. She has had a lot of traumatic and stressful events in her life that she has difficulty coping with. The patient has numerous medical issues that can be found in her medical chart. Many these have some components to difficulty with sympathetic arousal and anxiety issues.  Interventions Strategy:  Cognitive/behavioral psychotherapeutic interventions  Participation Level:   Active  Participation Quality:  Appropriate      Behavioral Observation:  Well Groomed, Alert, and Appropriate.   Current Psychosocial Factors: The patient reports she has been doing much better telling her family members know when they of persistent about trying to get her to take care of him in ways that are likely not beneficial to the family members along. The patient reports that this has helped her a great deal with regard to her anxiety and that she is continued work on Producer, television/film/videobuilding coping skills and strategies..  Content of Session:   Reviewed current symptoms and continued work on therapeutic interventions for depression and anxiety.  Current Status:   the patient reports that her mood continues to be better overall, but that family stress continues to push her anxiety and worry leads to depressive symptoms.  Patient Progress:   Very good  Target Goals:   Target goals include reducing the intensity, duration, and frequency of significant episodes of depression and anxiety.  Last  Reviewed:   05/23/2014  Goals Addressed Today:    Today we worked on Producer, television/film/videobuilding coping skills and strategies are in issues of her depression and anxiety.  Impression/Diagnosis:   The patient is a long-standing history of recurrent depression and anxiety. Fibromyalgia and irritable bowel syndrome are also present. 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  Diagnosis:    Axis I:  Major depressive disorder, recurrent episode, moderate  Anxiety state, unspecified      Axis II: No diagnosis     Vina Byrd R, PsyD 06/18/2014

## 2014-06-23 ENCOUNTER — Ambulatory Visit (INDEPENDENT_AMBULATORY_CARE_PROVIDER_SITE_OTHER): Payer: BC Managed Care – PPO | Admitting: Psychology

## 2014-06-23 DIAGNOSIS — F331 Major depressive disorder, recurrent, moderate: Secondary | ICD-10-CM

## 2014-06-23 DIAGNOSIS — F411 Generalized anxiety disorder: Secondary | ICD-10-CM

## 2014-06-24 ENCOUNTER — Encounter (HOSPITAL_COMMUNITY): Payer: Self-pay | Admitting: Psychology

## 2014-06-24 NOTE — Progress Notes (Signed)
    PROGRESS NOTE  Patient:  Yesenia Boyd   DOB: 09/09/1956  MR Number: [id number]  [facility] [address] [place] [address] Dept: [phone]  Start: 4 PM End: 5 PM  Provider/Observer:     Hershal CoriaJohn R Revel Stellmach PSYD  Chief Complaint:      Chief Complaint  Patient presents with  . Depression  . Anxiety  . Stress    Reason For Service:     The patient initially presented with issues of depression, anxiety, and adjustment difficulties. She has had a lot of traumatic and stressful events in her life that she has difficulty coping with. The patient has numerous medical issues that can be found in her medical chart. Many these have some components to difficulty with sympathetic arousal and anxiety issues.  Interventions Strategy:  Cognitive/behavioral psychotherapeutic interventions  Participation Level:   Active  Participation Quality:  Appropriate      Behavioral Observation:  Well Groomed, Alert, and Appropriate.   Current Psychosocial Factors: The patient reports that there've been a lot of family stressors recently. Most notably, her son who has a number of cognitive difficulties and has been in a very tumultuous relationship had an argument with his girlfriend and mother of this child. Girlfriend was taken her sons Xbox and common in the process of him trying to retrieve those he ended up striking his hand across her chin. When he threatened to leave she called the police and he was charged with assault of a female. His girlfriend tried to go to the police to get the charges dropped but the state refused to drop the charges even with her requesting that be done. There is a legal case and will be coming up shortly. The patient has been quite involved in trying to get this worked out..  Content of Session:   Reviewed current symptoms and continued work on therapeutic interventions  for depression and anxiety.  Current Status:   the patient reports that her anxiety has been worse recently primarily precipitated by psychosocial stressors related to her son..  Patient Progress:   Very good  Target Goals:   Target goals include reducing the intensity, duration, and frequency of significant episodes of depression and anxiety.  Last Reviewed:   06/23/2014  Goals Addressed Today:    Today we worked on Producer, television/film/videobuilding coping skills and strategies are in issues of her depression and anxiety.  Impression/Diagnosis:   The patient is a long-standing history of recurrent depression and anxiety. Fibromyalgia and irritable bowel syndrome are also present. 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  Diagnosis:    Axis I:  Major depressive disorder, recurrent episode, moderate  Anxiety state, unspecified      Axis II: No diagnosis     Fayette Gasner R, PsyD 06/24/2014

## 2014-07-01 DIAGNOSIS — R5381 Other malaise: Secondary | ICD-10-CM | POA: Diagnosis not present

## 2014-07-01 DIAGNOSIS — R5383 Other fatigue: Secondary | ICD-10-CM | POA: Diagnosis not present

## 2014-07-01 DIAGNOSIS — Z6826 Body mass index (BMI) 26.0-26.9, adult: Secondary | ICD-10-CM | POA: Diagnosis not present

## 2014-07-01 DIAGNOSIS — J309 Allergic rhinitis, unspecified: Secondary | ICD-10-CM | POA: Diagnosis not present

## 2014-07-03 ENCOUNTER — Encounter: Payer: Self-pay | Admitting: Gastroenterology

## 2014-07-08 ENCOUNTER — Encounter (HOSPITAL_COMMUNITY): Payer: Self-pay | Admitting: Emergency Medicine

## 2014-07-08 ENCOUNTER — Emergency Department (HOSPITAL_COMMUNITY)
Admission: EM | Admit: 2014-07-08 | Discharge: 2014-07-08 | Disposition: A | Discharge status: Home / Self Care | Payer: BC Managed Care – PPO | Attending: Emergency Medicine | Admitting: Emergency Medicine

## 2014-07-08 DIAGNOSIS — R5383 Other fatigue: Secondary | ICD-10-CM

## 2014-07-08 DIAGNOSIS — M542 Cervicalgia: Secondary | ICD-10-CM | POA: Insufficient documentation

## 2014-07-08 DIAGNOSIS — K589 Irritable bowel syndrome without diarrhea: Secondary | ICD-10-CM | POA: Insufficient documentation

## 2014-07-08 DIAGNOSIS — Z79899 Other long term (current) drug therapy: Secondary | ICD-10-CM | POA: Diagnosis not present

## 2014-07-08 DIAGNOSIS — Z9889 Other specified postprocedural states: Secondary | ICD-10-CM | POA: Diagnosis not present

## 2014-07-08 DIAGNOSIS — Z880 Allergy status to penicillin: Secondary | ICD-10-CM | POA: Insufficient documentation

## 2014-07-08 DIAGNOSIS — Z792 Long term (current) use of antibiotics: Secondary | ICD-10-CM | POA: Diagnosis not present

## 2014-07-08 DIAGNOSIS — IMO0001 Reserved for inherently not codable concepts without codable children: Secondary | ICD-10-CM | POA: Insufficient documentation

## 2014-07-08 DIAGNOSIS — R5381 Other malaise: Secondary | ICD-10-CM | POA: Diagnosis not present

## 2014-07-08 DIAGNOSIS — M79609 Pain in unspecified limb: Secondary | ICD-10-CM | POA: Insufficient documentation

## 2014-07-08 DIAGNOSIS — Z91040 Latex allergy status: Secondary | ICD-10-CM | POA: Insufficient documentation

## 2014-07-08 LAB — BASIC METABOLIC PANEL
Anion gap: 11 (ref 5–15)
BUN: 6 mg/dL (ref 6–23)
CO2: 26 mEq/L (ref 19–32)
Calcium: 8.9 mg/dL (ref 8.4–10.5)
Chloride: 102 mEq/L (ref 96–112)
Creatinine, Ser: 0.79 mg/dL (ref 0.50–1.10)
GFR calc Af Amer: >90 mL/min (ref >90)
GFR calc non Af Amer: >90 mL/min (ref >90)
Glucose, Bld: 87 mg/dL (ref 70–99)
Potassium: 3.9 mEq/L (ref 3.7–5.3)
Sodium: 139 mEq/L (ref 137–147)

## 2014-07-08 LAB — CBC
HCT: 33.2 % — ABNORMAL LOW (ref 36.0–46.0)
Hemoglobin: 11.5 g/dL — ABNORMAL LOW (ref 12.0–15.0)
MCH: 27.1 pg (ref 26.0–34.0)
MCHC: 34.6 g/dL (ref 30.0–36.0)
MCV: 78.3 fL (ref 78.0–100.0)
Platelets: 210 10*3/uL (ref 150–400)
RBC: 4.24 MIL/uL (ref 3.87–5.11)
RDW: 12.7 % (ref 11.5–15.5)
WBC: 4.9 10*3/uL (ref 4.0–10.5)

## 2014-07-08 LAB — TROPONIN I: Troponin I: <0.3 ng/mL (ref <0.30)

## 2014-07-08 NOTE — ED Notes (Signed)
Pt reports generalized weakness x 3 days, states "I just feel so tired all the time." Pt also reports right sided chronic dental pain but states pain has increased over last several weeks and now pain radiates down the right side of her neck. Pt denies fever/chills. Denies CP, N/V, SOB. Pt AO x4. Neuro intact.

## 2014-07-08 NOTE — ED Notes (Signed)
Pt showed a picture where she had swollen lymph nodes on her neck that were fairly large. Pt states she has felt weak.

## 2014-07-08 NOTE — Discharge Instructions (Signed)
Keep your scheduled appointment with the ear nose and throat doctor later this month. You can also followup with Dr.Hasani as needed. Lab work today was normal. Return if concerned for any reason

## 2014-07-08 NOTE — ED Provider Notes (Signed)
CSN: [id number]     Arrival date & time 07/08/14  1428 History   First MD Initiated Contact with Patient 07/08/14 1723     Chief Complaint  Patient presents with  . Fatigue  . Dental Problem     (Consider location/radiation/quality/duration/timing/severity/associated sxs/prior Treatment) HPI complains of pain at the axilla and at right right neck for approximately several months. She also complains of fatigue for the past one week. Questionable fever, she has not taken her temperature. She reports that she's been treated with 2 separate antibiotics for the same complaint . She showed me a picture on her cell phone of swollen lymph glands at posterior neck which were present one week ago, which has since resolved. Nothing makes symptoms better or worse. Patient has been seen by multiple physicians for same complaint. She has also seen her dentist who has told her that she does not have a dental problem. She reports that was referred to an ear nose throat physician by her dentist with whom she has an appointment later this month. Nothing makes symptoms better or worse  Past Medical History  Diagnosis Date  . IBS (irritable bowel syndrome)   . S/P endoscopy 2008, Aug 2010, Sept 2012    Dr. Linna Darner 2008: removal of impacted food bolus, Dr. Linna Darner 2010: moderate gastritis, Sept 2012 with SLF: path with mild gastritis, no definite stricture noted, dilation with Savary 16 mm  . S/P colonoscopy April 2009, Sept 2012    normal Dr. Linna Darner, internal hemorrhoids, repeat in Sept 2022  . Numbness and tingling in right hand     and whole left side of body-pt sts she has "spells to where she cannot walk or talk"  . Fibromyalgia     sees Dr Philis Kendall at [facility]  . Peripheral neuropathy   . Myasthenia gravis     right sided weakness occ.  . Sleep apnea     no cpap used, was told to get oxygen  to use  . Bile reflux gastritis    Past Surgical History  Procedure Laterality Date  . Nissen fundoplication    .  Cholecystectomy    . Abdominal hysterectomy    . Esophageal surgery?      2008 [facility]  . Hand surgery      carpal tumnnel right and removal of cyst left  . Neck surgery      removal of "knot"t from neck  . Bladder surgery      stretch bladder opening  . Savory dilation  07/18/2011    Procedure: SAVORY DILATION;  Surgeon: Arlyce Harman, MD;  Location: [facility];  Service: Endoscopy;  Laterality: N/A;  with propofol sedation ; #16 savory dilation  . Esophageal manometry N/A 01/21/2013    Procedure: ESOPHAGEAL MANOMETRY (EM);  Surgeon: Valarie Merino, MD;  Location: [facility];  Service: General;  Laterality: N/A;  . Laparoscopic nissen fundoplication N/A 02/13/2013    Procedure: Redo LAPAROSCOPIC NISSEN FUNDOPLICATION;  Surgeon: Valarie Merino, MD;  Location: [facility];  Service: General;  Laterality: N/A;  Forgut explortation with partial  takedown nissan funliplication from 1994 for wrap torsion and persistant dysphagia   Family History  Problem Relation Age of Onset  . Colon cancer Cousin     4 cousins  . Anesthesia problems Sister   . Cancer Sister     breast  . Hypertension Sister   . Pseudochol deficiency Neg Hx   . Hypotension Neg Hx   . Malignant hyperthermia Neg Hx   .  Parkinson's disease Mother   . Kidney disease Mother   . Diabetes Mother   . Heart attack Father   . Cancer Father     stomach cancer  . Hypertension Brother   . Diabetes Brother   . Arthritis Brother   . Kidney disease Brother   . Parkinson's disease Brother   . Cancer Maternal Aunt     breast  . Cancer Maternal Grandmother     breast  . Multiple sclerosis Daughter    History  Substance Use Topics  . Smoking status: Never Smoker   . Smokeless tobacco: Never Used  . Alcohol Use: Yes     Comment: drinks wine "every blue moon"   OB History   Grav Para Term Preterm Abortions TAB SAB Ect Mult Living                 Review of Systems  Constitutional: Positive for fatigue.  Musculoskeletal:  Positive for neck pain.  Neurological: Positive for weakness.       Chronic right arm weakness  Hematological: Positive for adenopathy.  All other systems reviewed and are negative.     Allergies  Avelox; Linzess; Adhesive; Cephalexin; Codeine; Cymbalta; Dicyclomine; Latex; Macrolides and ketolides; Meperidine hcl; Metronidazole; Morphine; Penicillins; Prednisone; Sulfonamide derivatives; Tetracycline; and Tramadol hcl  Home Medications   Prior to Admission medications   Medication Sig Start Date End Date Taking? Authorizing Provider  acetaminophen (TYLENOL) 650 MG CR tablet Take 650 mg by mouth every 8 (eight) hours as needed for pain.     Historical Provider, MD  albuterol (PROVENTIL HFA;VENTOLIN HFA) 108 (90 BASE) MCG/ACT inhaler Inhale 2 puffs into the lungs every 4 (four) hours as needed for shortness of breath.    Historical Provider, MD  azelastine (ASTELIN) 137 MCG/SPRAY nasal spray Place 1 spray into the nose 2 (two) times daily.     Historical Provider, MD  CARAFATE 1 GM/10ML suspension Take 1 g by mouth 4 (four) times daily.  12/21/12   Historical Provider, MD  Cholecalciferol ([employer] VIT D 5000 HIGH-POTENCY) 5000 UNITS capsule Take 5,000 Units by mouth every morning.     Historical Provider, MD  clindamycin (CLEOCIN T) 1 % lotion Apply topically 2 (two) times daily.  03/03/14   Historical Provider, MD  cycloSPORINE (RESTASIS) 0.05 % ophthalmic emulsion Place 1 drop into both eyes 2 (two) times daily as needed.     Historical Provider, MD  esomeprazole (NEXIUM) 40 MG capsule Take 40 mg by mouth 2 (two) times daily.     Historical Provider, MD  estrogens, conjugated, (PREMARIN) 0.625 MG tablet Take 0.625 mg by mouth every morning. Take daily for 21 days then do not take for 7 days.    Historical Provider, MD  guaiFENesin (MUCINEX) 600 MG 12 hr tablet Take 1,200 mg by mouth 2 (two) times daily.    Historical Provider, MD  hydrOXYzine (VISTARIL) 25 MG capsule Take 25 mg by mouth at  bedtime.  12/19/12   Historical Provider, MD  L-Methylfolate-B6-B12 (METANX PO) Take 1 tablet by mouth at bedtime.     Historical Provider, MD  levocetirizine (XYZAL) 5 MG tablet Take 5 mg by mouth every morning.     Historical Provider, MD  lubiprostone ([name]) 24 MCG capsule Take 24 mcg by mouth 2 (two) times daily with a meal.      Historical Provider, MD  magnesium citrate solution Take 296 mLs by mouth daily as needed. For blockage    Historical Provider, MD  PRESCRIPTION MEDICATION Apply 1 application topically every morning. LCD10% with Salicylic Acid 2%. Use as directed for skin and rashes.    Historical Provider, MD  simethicone (GAS-X) 80 MG chewable tablet Chew 80 mg by mouth every 6 (six) hours as needed. For gas    Historical Provider, MD  tetrahydrozoline 0.05 % ophthalmic solution Place 1 drop into both eyes 4 (four) times daily as needed (For eye irritation.).    Historical Provider, MD  tiZANidine (ZANAFLEX) 4 MG tablet Take 4 mg by mouth at bedtime.  12/07/12   Historical Provider, MD  triamcinolone (KENALOG) 0.1 % cream Apply 1 application topically 2 (two) times daily as needed. For skin.    Historical Provider, MD  vitamin E 1000 UNIT capsule Take 1,000 Units by mouth daily.    Historical Provider, MD   BP 113/68  Pulse 60  Temp(Src) 98 F (36.7 C) (Oral)  Resp 18  SpO2 100% Physical Exam  Nursing note and vitals reviewed. Constitutional: She is oriented to person, place, and time. She appears well-developed and well-nourished.  HENT:  Head: Normocephalic and atraumatic.  Gingiva normal. She removed both partial plates. Teeth are not tender.  Eyes: Conjunctivae are normal. Pupils are equal, round, and reactive to light.  Neck: Neck supple. No tracheal deviation present. No thyromegaly present.  Cardiovascular: Normal rate and regular rhythm.   No murmur heard. Pulmonary/Chest: Effort normal and breath sounds normal.  Abdominal: Soft. Bowel sounds are normal. She  exhibits no distension. There is no tenderness.  Musculoskeletal: Normal range of motion. She exhibits no edema and no tenderness.  Lymphadenopathy:    She has no cervical adenopathy.  Neurological: She is alert and oriented to person, place, and time. No cranial nerve deficit. Coordination normal.  Gait normal Romberg normal pronator drift normal finger-nose normal. DTRs symmetric bilaterally knee jerk ankle jerk and biceps toes or go bilaterally  Skin: Skin is warm and dry. No rash noted.  Psychiatric: She has a normal mood and affect.    ED Course  Procedures (including critical care time) Labs Review Labs Reviewed  CBC - Abnormal; Notable for the following:    Hemoglobin 11.5 (*)    HCT 33.2 (*)    All other components within normal limits  BASIC METABOLIC PANEL    Imaging Review No results found.   EKG Interpretation   Date/Time:  Tuesday July 08 2014 14:40:39 EDT Ventricular Rate:  81 PR Interval:  134 QRS Duration: 80 QT Interval:  398 QTC Calculation: 462 R Axis:   60 Text Interpretation:  Normal sinus rhythm Normal ECG No significant change  since last tracing Confirmed by Ethelda Chick  MD, Christiaan Strebeck [phone]) on 07/08/2014  5:48:10 PM     Results for orders placed during the hospital encounter of 07/08/14  CBC      Result Value Ref Range   WBC 4.9  4.0 - 10.5 K/uL   RBC 4.24  3.87 - 5.11 MIL/uL   Hemoglobin 11.5 (*) 12.0 - 15.0 g/dL   HCT 40.9 (*) 81.1 - 91.4 %   MCV 78.3  78.0 - 100.0 fL   MCH 27.1  26.0 - 34.0 pg   MCHC 34.6  30.0 - 36.0 g/dL   RDW 78.2  95.6 - 21.3 %   Platelets 210  150 - 400 K/uL  BASIC METABOLIC PANEL      Result Value Ref Range   Sodium 139  137 - 147 mEq/L   Potassium 3.9  3.7 - 5.3  mEq/L   Chloride 102  96 - 112 mEq/L   CO2 26  19 - 32 mEq/L   Glucose, Bld 87  70 - 99 mg/dL   BUN 6  6 - 23 mg/dL   Creatinine, Ser 1.61  0.50 - 1.10 mg/dL   Calcium 8.9  8.4 - 09.6 mg/dL   GFR calc non Af Amer >90  >90 mL/min   GFR calc Af Amer  >90  >90 mL/min   Anion gap 11  5 - 15  TROPONIN I      Result Value Ref Range   Troponin I <0.30  <0.30 ng/mL   No results found.  MDM  Plan patient is encouraged to follow up with ear nose and throat doctor. Keep scheduled appointment.  PT is chronic. No evidence of acute coronary syndrome  Dx#1 neck pain  #2 maxillary pain  #3 fatigue  Final diagnoses:  None        Doug Sou, MD 07/08/14 1916

## 2014-07-18 DIAGNOSIS — Z23 Encounter for immunization: Secondary | ICD-10-CM | POA: Diagnosis not present

## 2014-07-25 ENCOUNTER — Ambulatory Visit (INDEPENDENT_AMBULATORY_CARE_PROVIDER_SITE_OTHER): Payer: BC Managed Care – PPO | Admitting: Psychology

## 2014-07-25 DIAGNOSIS — F419 Anxiety disorder, unspecified: Secondary | ICD-10-CM

## 2014-07-25 DIAGNOSIS — F411 Generalized anxiety disorder: Secondary | ICD-10-CM

## 2014-07-25 DIAGNOSIS — F331 Major depressive disorder, recurrent, moderate: Secondary | ICD-10-CM

## 2014-08-04 DIAGNOSIS — G500 Trigeminal neuralgia: Secondary | ICD-10-CM | POA: Diagnosis not present

## 2014-08-05 DIAGNOSIS — Z6826 Body mass index (BMI) 26.0-26.9, adult: Secondary | ICD-10-CM | POA: Diagnosis not present

## 2014-08-05 DIAGNOSIS — M542 Cervicalgia: Secondary | ICD-10-CM | POA: Diagnosis not present

## 2014-08-11 DIAGNOSIS — R51 Headache: Secondary | ICD-10-CM | POA: Diagnosis not present

## 2014-08-21 ENCOUNTER — Encounter (HOSPITAL_COMMUNITY): Payer: Self-pay | Admitting: Psychology

## 2014-08-21 ENCOUNTER — Ambulatory Visit (INDEPENDENT_AMBULATORY_CARE_PROVIDER_SITE_OTHER): Payer: BC Managed Care – PPO | Admitting: Psychology

## 2014-08-21 DIAGNOSIS — F331 Major depressive disorder, recurrent, moderate: Secondary | ICD-10-CM

## 2014-08-21 DIAGNOSIS — F419 Anxiety disorder, unspecified: Secondary | ICD-10-CM

## 2014-08-21 NOTE — Progress Notes (Signed)
    PROGRESS NOTE  Patient:  Yesenia Boyd   DOB: 05/11/1956  MR Number: [id number]  Location: [facility] [address] [place] [address] Dept: [phone]  Start: 4 PM  End: 5 PM  Provider/Observer:     Hershal CoriaJohn R Betti Goodenow PSYD  Chief Complaint:      Chief Complaint  Patient presents with  . Depression  . Anxiety    Reason For Service:     The patient initially presented with issues of depression, anxiety, and adjustment difficulties. She has had a lot of traumatic and stressful events in her life that she has difficulty coping with. The patient has numerous medical issues that can be found in her medical chart. Many these have some components to difficulty with sympathetic arousal and anxiety issues.  Interventions Strategy:  Cognitive/behavioral psychotherapeutic interventions  Participation Level:   Active  Participation Quality:  Appropriate      Behavioral Observation:  Well Groomed, Alert, and Appropriate.   Current Psychosocial Factors: The patient reports that there've been a lot of family stressors recently. Most notably, her son who has a number of cognitive difficulties and has been in a very tumultuous relationship had an argument with his girlfriend and mother of this child. Girlfriend was taken her sons Xbox and common in the process of him trying to retrieve those he ended up striking his hand across her chin. When he threatened to leave she called the police and he was charged with assault of a female. His girlfriend tried to go to the police to get the charges dropped but the state refused to drop the charges even with her requesting that be done. There is a legal case and will be coming up shortly. The patient has been quite involved in trying to get this worked out..  Content of Session:   Reviewed current symptoms and continued work on therapeutic interventions for  depression and anxiety.  Current Status:   the patient reports that her anxiety has been worse recently primarily precipitated by psychosocial stressors related to her son..  Patient Progress:   Very good  Target Goals:   Target goals include reducing the intensity, duration, and frequency of significant episodes of depression and anxiety.  Last Reviewed:   07/25/2014  Goals Addressed Today:    Today we worked on Producer, television/film/videobuilding coping skills and strategies are in issues of her depression and anxiety.  Impression/Diagnosis:   The patient is a long-standing history of recurrent depression and anxiety. Fibromyalgia and irritable bowel syndrome are also present. 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  Diagnosis:    Axis I:  Major depressive disorder, recurrent episode, moderate  Anxiety      Axis II: No diagnosis     Bannon Giammarco R, PsyD 08/21/2014

## 2014-08-28 ENCOUNTER — Encounter (HOSPITAL_COMMUNITY): Payer: Self-pay | Admitting: Psychology

## 2014-08-28 NOTE — Progress Notes (Signed)
    PROGRESS NOTE  Patient:  Yesenia Boyd   DOB: 03/30/1956  MR Number: [id number]  Location: [facility] [address] [place] [address] Dept: [phone]  Start: 3 PM  End: 4 PM  Provider/Observer:     Hershal CoriaJohn R Shantea Poulton PSYD  Chief Complaint:      Chief Complaint  Patient presents with  . Anxiety  . Depression  . Stress    Reason For Service:     The patient initially presented with issues of depression, anxiety, and adjustment difficulties. She has had a lot of traumatic and stressful events in her life that she has difficulty coping with. The patient has numerous medical issues that can be found in her medical chart. Many these have some components to difficulty with sympathetic arousal and anxiety issues.  Interventions Strategy:  Cognitive/behavioral psychotherapeutic interventions  Participation Level:   Active  Participation Quality:  Appropriate      Behavioral Observation:  Well Groomed, Alert, and Appropriate.   Current Psychosocial Factors: The patient reports that psychosocial issues have diminished recently and that she has been actively working on better ways to cope with this. The patient reports that she went to see one of her physicians who gave her information that she was quite confused by. We reviewed this and looked at interpret what she been told regarding her overall status.  Content of Session:   Reviewed current symptoms and continued work on therapeutic interventions for depression and anxiety.  Current Status:   the patient reports that her anxiety has been worse recently primarily precipitated by psychosocial stressors related to her son..  Patient Progress:   Very good  Target Goals:   Target goals include reducing the intensity, duration, and frequency of significant episodes of depression and anxiety.  Last Reviewed:   08/21/2014  Goals Addressed  Today:    Today we worked on Producer, television/film/videobuilding coping skills and strategies are in issues of her depression and anxiety.  Impression/Diagnosis:   The patient is a long-standing history of recurrent depression and anxiety. Fibromyalgia and irritable bowel syndrome are also present. 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  Diagnosis:    Axis I:  Major depressive disorder, recurrent episode, moderate  Anxiety      Axis II: No diagnosis     Annabel Gibeau R, PsyD 08/28/2014

## 2014-09-04 DIAGNOSIS — M25571 Pain in right ankle and joints of right foot: Secondary | ICD-10-CM | POA: Diagnosis not present

## 2014-09-04 DIAGNOSIS — Z6826 Body mass index (BMI) 26.0-26.9, adult: Secondary | ICD-10-CM | POA: Diagnosis not present

## 2014-09-17 ENCOUNTER — Encounter: Payer: Self-pay | Admitting: *Deleted

## 2014-09-22 ENCOUNTER — Ambulatory Visit: Payer: Medicare Other | Admitting: Gastroenterology

## 2014-09-23 ENCOUNTER — Ambulatory Visit: Payer: Medicare Other | Admitting: Gastroenterology

## 2014-09-23 ENCOUNTER — Ambulatory Visit (INDEPENDENT_AMBULATORY_CARE_PROVIDER_SITE_OTHER): Payer: BC Managed Care – PPO | Admitting: Psychology

## 2014-09-23 DIAGNOSIS — F419 Anxiety disorder, unspecified: Secondary | ICD-10-CM

## 2014-09-23 DIAGNOSIS — F331 Major depressive disorder, recurrent, moderate: Secondary | ICD-10-CM

## 2014-10-15 ENCOUNTER — Encounter (HOSPITAL_COMMUNITY): Payer: Self-pay | Admitting: Psychology

## 2014-10-15 NOTE — Progress Notes (Signed)
      PROGRESS NOTE  Patient:  Yesenia Boyd   DOB: 08/08/1956  MR Number: [id number]  [facility]: [facility] [address] [place] [address] Dept: [phone]  Start: 4 PM  End: 5 PM  Provider/Observer:     Hershal CoriaJohn R Laketia Vicknair PSYD  Chief Complaint:      Chief Complaint  Patient presents with  . Anxiety  . Depression  . Stress    Reason For Service:     The patient initially presented with issues of depression, anxiety, and adjustment difficulties. She has had a lot of traumatic and stressful events in her life that she has difficulty coping with. The patient has numerous medical issues that can be found in her medical chart. Many these have some components to difficulty with sympathetic arousal and anxiety issues.  Interventions Strategy:  Cognitive/behavioral psychotherapeutic interventions  Participation Level:   Active  Participation Quality:  Appropriate      Behavioral Observation:  Well Groomed, Alert, and Appropriate.   Current Psychosocial Factors: The patient reports that she has been doing much better as far as keeping her family from expecting too much and demanding too much from her. She reports that this is really helped her mood..  Content of Session:   Reviewed current symptoms and continued work on therapeutic interventions for depression and anxiety.  Current Status:   the patient reports that her anxiety and depression significantly improved a she has been drawn better boundaries for people like her son, her husband, and her daughter..  Patient Progress:   Very good  Target Goals:   Target goals include reducing the intensity, duration, and frequency of significant episodes of depression and anxiety.  Last Reviewed:   09/23/2014  Goals Addressed Today:    Today we worked on Producer, television/film/videobuilding coping skills and strategies are in issues of her depression and  anxiety.  Impression/Diagnosis:   The patient is a long-standing history of recurrent depression and anxiety. Fibromyalgia and irritable bowel syndrome are also present. 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  Diagnosis:    Axis I:  Major depressive disorder, recurrent episode, moderate  Anxiety      Axis II: No diagnosis     Bodey Frizell R, PsyD 10/15/2014

## 2014-10-22 ENCOUNTER — Encounter: Payer: Self-pay | Admitting: Gastroenterology

## 2014-10-22 ENCOUNTER — Ambulatory Visit (INDEPENDENT_AMBULATORY_CARE_PROVIDER_SITE_OTHER): Payer: BC Managed Care – PPO | Admitting: Gastroenterology

## 2014-10-22 ENCOUNTER — Encounter (INDEPENDENT_AMBULATORY_CARE_PROVIDER_SITE_OTHER): Payer: Self-pay

## 2014-10-22 VITALS — BP 108/65 | HR 69 | Temp 97.3°F | Ht 63.0 in | Wt 150.6 lb

## 2014-10-22 DIAGNOSIS — K589 Irritable bowel syndrome without diarrhea: Secondary | ICD-10-CM | POA: Diagnosis not present

## 2014-10-22 DIAGNOSIS — K625 Hemorrhage of anus and rectum: Secondary | ICD-10-CM

## 2014-10-22 MED ORDER — HYDROCORTISONE 2.5 % RE CREA: 1.0000 "application " | TOPICAL_CREAM | Freq: Two times a day (BID) | RECTAL | Status: DC

## 2014-10-22 NOTE — Assessment & Plan Note (Signed)
Continue [name] BID. Has multiple reasons for not adding other agents such as fiber, probiotic, stool softener, Miralax (see HPI). Abdominal pain and bloating noted likely multifactorial in the setting of constipation; [place]. 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 Olena LeatherwoodHasanaj has prescribed in past for antibiotics. Will research this; I did tell her that certain abx may not be used for similar situations, and the abx he prescribed may not be appropriate for treatment of bacterial overgrowth. States understanding.

## 2014-10-22 NOTE — Progress Notes (Signed)
Referring Provider: Toma DeitersHasanaj, Xaje A, MD Primary Care Physician:  Toma DeitersHASANAJ,XAJE A, MD Primary GI: Dr. Darrick PennaFields   Chief Complaint  Patient presents with  . Abdominal Pain  . Gastrophageal Reflux    HPI:   Presents today in follow-up with history of GERD, constipation, chronic abdominal pain. Last seen in May 2015. Noted epigastric soreness at that time. Last EGD by Dr. Darrick PennaFields in Sept 2012 with mild gastritis, empiric dilation. I had recommended a CT at time of last appt in May 2015, but she cancelled this.   Occasional low-volume hematochezia. Last colonoscopy in 2012 with internal hemorrhoids. Takes [name] and gets cold spells, cramping in feet and legs but can't take Linzess as it is too strong. Has to take Mag Citrate at times to help have bowel movements. Rectum area felt crusty-like. Discomfort with bowel movements. Has been given nitro ointment in past but did not want to use due to concern for any allergic reactions. Can't take too much fiber as it causes severe gas. Couldn't do a probiotic.   Reflux intermittently. Nexium BID, Carafate QID. No improvement with Dexilant or other PPIs in the past. No N/V. Notes chronic RUQ discomfort.   [facility] positive hydrogen breath test.    Past Medical History  Diagnosis Date  . IBS (irritable bowel syndrome)   . S/P endoscopy 2008, Aug 2010, Sept 2012    Dr. Linna DarnerAnwar 2008: removal of impacted food bolus, Dr. Linna DarnerAnwar 2010: moderate gastritis, Sept 2012 with [facility]: path with mild gastritis, no definite stricture noted, dilation with Savary 16 mm  . S/P colonoscopy April 2009, Sept 2012    normal Dr. Linna DarnerAnwar, internal hemorrhoids, repeat in Sept 2022  . Numbness and tingling in right hand     and whole left side of body-pt sts she has "spells to where she cannot walk or talk"  . Fibromyalgia     [facility]  . Peripheral neuropathy   . Myasthenia gravis     right sided weakness occ.  . Sleep apnea     no cpap used, was  told to get oxygen  to use  . Bile reflux gastritis     Past Surgical History  Procedure Laterality Date  . Nissen fundoplication    . Cholecystectomy    . Abdominal hysterectomy    . Esophageal surgery?      2008 [facility]  . Hand surgery      carpal tumnnel right and removal of cyst left  . Neck surgery      removal of "knot"t from neck  . Bladder surgery      stretch bladder opening  . Savory dilation  07/18/2011    Surgeon:Sandi M Fields  . Esophageal manometry N/A 01/21/2013    Procedure: ESOPHAGEAL MANOMETRY (EM);  Surgeon: Valarie MerinoMatthew B Martin, MD;  Location: [facility];  Service: General;  Laterality: N/A;  . Laparoscopic nissen fundoplication N/A 02/13/2013    Procedure: Redo LAPAROSCOPIC NISSEN FUNDOPLICATION;  Surgeon: Valarie MerinoMatthew B Martin, MD;  Location: [facility];  Service: General;  Laterality: N/A;  Forgut explortation with partial  takedown nissan funliplication from 1994 for wrap torsion and persistant dysphagia    Current Outpatient Prescriptions  Medication Sig Dispense Refill  . acetaminophen (TYLENOL) 650 MG CR tablet Take 650 mg by mouth every 8 (eight) hours as needed for pain.     Marland Kitchen. albuterol (PROVENTIL HFA;VENTOLIN HFA) 108 (90 BASE) MCG/ACT inhaler Inhale 2 puffs into the lungs every 4 (  four) hours as needed for shortness of breath.    Marland Kitchen azelastine (ASTELIN) 137 MCG/SPRAY nasal spray Place 1 spray into the nose 2 (two) times daily.     . beclomethasone (QVAR) 80 MCG/ACT inhaler Inhale 2 puffs into the lungs daily.    Marland Kitchen CARAFATE 1 GM/10ML suspension Take 1 g by mouth 4 (four) times daily.     . carbamazepine (CARBATROL) 100 MG 12 hr capsule Take 100 mg by mouth daily.     . Cholecalciferol (CVS VIT D 5000 HIGH-POTENCY) 5000 UNITS capsule Take 5,000 Units by mouth every morning.     Marland Kitchen esomeprazole (NEXIUM) 40 MG capsule Take 40 mg by mouth 2 (two) times daily.     Marland Kitchen estrogens, conjugated, (PREMARIN) 0.625 MG tablet Take 0.625 mg by mouth every morning. Take daily for 21 days  then do not take for 7 days.    . hydrOXYzine (VISTARIL) 25 MG capsule Take 25 mg by mouth at bedtime.     Marland Kitchen L-Methylfolate-B6-B12 (METANX PO) Take 1 tablet by mouth at bedtime.     Marland Kitchen levocetirizine (XYZAL) 5 MG tablet Take 5 mg by mouth every morning.     . lubiprostone ([name]) 24 MCG capsule Take 24 mcg by mouth 2 (two) times daily with a meal.      . magnesium citrate solution Take 296 mLs by mouth daily as needed. For blockage    . PRESCRIPTION MEDICATION Apply 1 application topically every morning. LCD10% with Salicylic Acid 2%. Use as directed for skin and rashes.    . simethicone (GAS-X) 80 MG chewable tablet Chew 80 mg by mouth every 6 (six) hours as needed. For gas    . tiZANidine (ZANAFLEX) 4 MG tablet Take 4 mg by mouth at bedtime.     . triamcinolone (KENALOG) 0.1 % cream Apply 1 application topically 2 (two) times daily as needed. For skin.    Marland Kitchen vitamin E 1000 UNIT capsule Take 1,000 Units by mouth daily.    . Eyelid Cleansers (VISINE TOTAL EYE SOOTHING EX) Place 1 drop into both eyes daily.     No current facility-administered medications for this visit.    Allergies as of 10/22/2014 - Review Complete 10/15/2014  Allergen Reaction Noted  . Avelox [moxifloxacin hcl in nacl] Anaphylaxis and Itching 06/20/2011  . Linzess [linaclotide] Swelling 02/04/2013  . Adhesive [tape] Other (See Comments) 01/24/2013  . Cephalexin Hives and Itching   . Codeine Hives and Itching   . Cymbalta [duloxetine hcl] Other (See Comments) 06/20/2011  . Dicyclomine Itching and Other (See Comments) 06/20/2011  . Latex Itching 01/24/2013  . Macrolides and ketolides Swelling 07/18/2011  . Meperidine hcl Itching and Other (See Comments)   . Metronidazole Hives and Other (See Comments) 06/20/2011  . Morphine Itching and Other (See Comments)   . Penicillins Hives   . Prednisone Hives, Swelling, and Other (See Comments) 06/20/2011  . Sulfonamide derivatives Hives   . Tetracycline Other (See Comments)     . Tramadol hcl Hives and Other (See Comments)     Family History  Problem Relation Age of Onset  . Colon cancer Cousin     4 cousins  . Anesthesia problems Sister   . Cancer Sister     breast  . Hypertension Sister   . Pseudochol deficiency Neg Hx   . Hypotension Neg Hx   . Malignant hyperthermia Neg Hx   . Parkinson's disease Mother   . Kidney disease Mother   . Diabetes Mother   .  Heart attack Father   . Cancer Father     stomach cancer  . Hypertension Brother   . Diabetes Brother   . Arthritis Brother   . Kidney disease Brother   . Parkinson's disease Brother   . Cancer Maternal Aunt     breast  . Cancer Maternal Grandmother     breast  . Multiple sclerosis Daughter     History   Social History  . Marital Status: Married    Spouse Name: N/A    Number of Children: N/A  . Years of Education: N/A   Social History Main Topics  . Smoking status: Never Smoker   . Smokeless tobacco: Never Used  . Alcohol Use: Yes     Comment: drinks wine "every blue moon"  . Drug Use: No  . Sexual Activity: Not Currently    Birth Control/ Protection: Surgical   Other Topics Concern  . None   Social History Narrative    Review of Systems: As mentioned in HPI  Physical Exam: BP 108/65 mmHg  Pulse 69  Temp(Src) 97.3 F (36.3 C) (Oral)  Ht 5\' 3"  (1.6 m)  Wt 150 lb 9.6 oz (68.312 kg)  BMI 26.68 kg/m2 General:   Alert and oriented. No distress noted. Pleasant and cooperative.  Head:  Normocephalic and atraumatic. Eyes:  Conjuctiva clear without scleral icterus. Mouth:  Oral mucosa pink and moist. Good dentition. No lesions. Abdomen:  +BS, soft, non-tender and non-distended. No rebound or guarding. No HSM or masses noted. Rectal: no external hemorrhoids appreciated, patient with discomfort attempting internal exam, declining further evaluation Msk:  Symmetrical without gross deformities. Normal posture. Extremities:  Without edema. Neurologic:  Alert and  oriented x4;   grossly normal neurologically. Psych:  Alert and cooperative. Normal mood and affect.

## 2014-10-22 NOTE — Assessment & Plan Note (Signed)
Intermittent low-volume in the setting of known internal hemorrhoids and constipation. Hesitant to trial any new bowel regimens and would like to stay with [name] 24 mcg BID and occasional Mag Citrate. Rectal discomfort with differentials to include hemorrhoids vs anal fissure; refusing Nitro ointment but willing to trial hydrocortisone cream. Will arrange outpatient banding with Dr. Darrick PennaFields. Last colonoscopy on file from 2012.

## 2014-10-22 NOTE — Patient Instructions (Signed)
Use the cream per rectum twice a day for 7 days.   I will see what antibiotics Dr. Olena LeatherwoodHasanaj used in the past.   You have been set up for a hemorrhoid banding with Dr. Darrick PennaFields.

## 2014-11-05 ENCOUNTER — Telehealth: Payer: Self-pay | Admitting: Gastroenterology

## 2014-11-05 NOTE — Telephone Encounter (Signed)
PATIENT CALLED INQUIRING ABOUT TREATING THE BACTERIA IN HER STOMACH  PLEASE CALL HER

## 2014-11-05 NOTE — Telephone Encounter (Signed)
Tobi Bastosnna, have you checked on this yet?

## 2014-11-14 NOTE — Telephone Encounter (Signed)
I called to inform pt and she said she saw Dr. Olena LeatherwoodHasanaj after she was here and he did some stool tests and said they were negative and she did not have an infection.

## 2014-11-14 NOTE — Telephone Encounter (Signed)
Requested records and due to holidays, I have not had a chance to see if I have them. Please let her know I should have an answer next week.

## 2014-11-18 ENCOUNTER — Ambulatory Visit (HOSPITAL_COMMUNITY): Payer: 59 | Admitting: Psychology

## 2014-11-18 NOTE — Progress Notes (Signed)
cc'ed to pcp °

## 2014-11-20 NOTE — Telephone Encounter (Signed)
I have not gotten records yet on last office visits with Dr. Olena LeatherwoodHasanaj. She had wanted me to see what antibiotics he had given her in the past. She is allergic to multiple medications.

## 2014-11-20 NOTE — Telephone Encounter (Signed)
I sent a request to get last 2 office notes from Dr Arvid RightHasanja's office.

## 2014-11-27 ENCOUNTER — Encounter: Payer: Self-pay | Admitting: Gastroenterology

## 2014-11-27 ENCOUNTER — Telehealth: Payer: Self-pay | Admitting: Gastroenterology

## 2014-11-27 NOTE — Telephone Encounter (Signed)
Patient called this afternoon asking about doing an appeal to her insurance company about getting her Nexium.  She [facility] [employer] [facility]  I told her to call her pharmacy and have them fax us PA papers and she said that another doctor had already done that and it didn't go through. I told her to have the pharmacy send the PA papers anyway and let our nurse see what she could do and then she said "even if [facility] didn't write the prescription"?  I told her that it looked like [facility] had prescribed her Nexium in the past, but patient said she wanted someone with higher authority to do her appeal.  I asked her if she could get OTC to hold her until she can get her rx straightened out and she is refusing to pay for OTC meds because she'll have to pay more to get less.  Do we have any samples to give her and can someone call her back to discuss doing an appeal with her insurance company. [phone] or [phone]

## 2014-12-01 ENCOUNTER — Telehealth: Payer: Self-pay | Admitting: Gastroenterology

## 2014-12-01 NOTE — Telephone Encounter (Signed)
CONTACT PT'S PHARMACY. SUBMIT PA FOR NEXIUM.

## 2014-12-01 NOTE — Telephone Encounter (Signed)
PATIENT CALLED AGAIN ABOUT HER NEXIUM.  STATES THAT SHE HAD NOT HEARD BACK FROM OUR OFFICE YET ABOUT IT AND SHE WAS SPITTING UP REFLUX LAST NIGHT.  STATES SOMETHING NEEDS TO BE DONE AND SHE WILL TALK TO DR FIELDS WHEN SHE COMES IN, IS UPSET THAT THIS IS TAKING SO LONG.  PLEASE ADVISE.

## 2014-12-01 NOTE — Telephone Encounter (Signed)
I spoke to Yesenia Boyd and she said she does not know what is going on with this pt.  She said if the PCP has been prescribing, then they would have been the one to do the PA and they would need to do the appeal. I spoke with the pt and explained this to her.   She said that Yesenia Boyd first started her on Nexium and then she started getting from Yesenia Boyd because she sees him more frequently.   Pt has appt here wirth Yesenia Boyd for banding on 12/04/2014 and said that she will discuss with her then.

## 2014-12-01 NOTE — Telephone Encounter (Signed)
Yesenia Boyd spoke with the pt.  

## 2014-12-02 DIAGNOSIS — K210 Gastro-esophageal reflux disease with esophagitis: Secondary | ICD-10-CM | POA: Diagnosis not present

## 2014-12-02 DIAGNOSIS — Z6827 Body mass index (BMI) 27.0-27.9, adult: Secondary | ICD-10-CM | POA: Diagnosis not present

## 2014-12-02 MED ORDER — ESOMEPRAZOLE MAGNESIUM 40 MG PO CPDR: 40.0000 mg | DELAYED_RELEASE_CAPSULE | Freq: Two times a day (BID) | ORAL | Status: DC

## 2014-12-02 NOTE — Addendum Note (Signed)
Addended by: West BaliFIELDS, Anniston Nellums L on: 12/02/2014 12:50 PM   Modules accepted: Orders

## 2014-12-02 NOTE — Telephone Encounter (Signed)
RX SENT. GET THE PA.

## 2014-12-02 NOTE — Telephone Encounter (Signed)
[facility], she said the nexium was written by her pcp and the PA information was sent to him and she wasn't allowed to send us the information unless we are the prescribers.

## 2014-12-02 NOTE — Telephone Encounter (Signed)
PA done and sent to the insurance co.  

## 2014-12-03 NOTE — Telephone Encounter (Signed)
REVIEWED-NO ADDITIONAL RECOMMENDATIONS. 

## 2014-12-03 NOTE — Telephone Encounter (Addendum)
PA done for Nexium bid. PA was denied- the insurance response was:  "[employer] cannot perform this prior authorization request because the requested product is a plan exclusion for this member, so there is no coverage criteria to review and apply"  Pt has ov with [facility] tomorrow 12/04/14.

## 2014-12-04 ENCOUNTER — Other Ambulatory Visit: Payer: Self-pay

## 2014-12-04 ENCOUNTER — Telehealth: Payer: Self-pay

## 2014-12-04 ENCOUNTER — Ambulatory Visit (INDEPENDENT_AMBULATORY_CARE_PROVIDER_SITE_OTHER): Payer: 59 | Admitting: Gastroenterology

## 2014-12-04 ENCOUNTER — Encounter: Payer: Self-pay | Admitting: Gastroenterology

## 2014-12-04 VITALS — BP 111/67 | HR 70 | Temp 98.1°F | Ht 63.0 in | Wt 154.0 lb

## 2014-12-04 DIAGNOSIS — K625 Hemorrhage of anus and rectum: Secondary | ICD-10-CM

## 2014-12-04 DIAGNOSIS — R1310 Dysphagia, unspecified: Secondary | ICD-10-CM | POA: Diagnosis not present

## 2014-12-04 DIAGNOSIS — K6289 Other specified diseases of anus and rectum: Secondary | ICD-10-CM

## 2014-12-04 DIAGNOSIS — K219 Gastro-esophageal reflux disease without esophagitis: Secondary | ICD-10-CM

## 2014-12-04 NOTE — Assessment & Plan Note (Addendum)
SX MOST LIKELY DUE TO SMALL INTERNAL HEMORRHOIDS, LESS LIKELY POLYP OR MASS. LAST TCS 2012-REVIEWED.  USE HCT CREAM NO MORE THAN 3-4 TIMES A YEAR. DISCUSSED RISKS OF USING IT EVERY DAY. DRINK WATER EAT FIBER AVOID CONSTIPATION FLEXIBLE SIGMOIDOSCOPY/POSSIBLE HEMORRHOID BANDING FEB 5-DISCUSSED PROCEDURE, BENEFITS, & RISKS: < 1% chance of medication reaction, PERFORATION, PELVIC VEIN SEPSIS, OR bleeding. FOLLOW UP IN 4 MOS.

## 2014-12-04 NOTE — Progress Notes (Signed)
REVIEWED. Pt needs FLEX SIG/IH BANDING IF SHE WOULD LIKE TO PURSUE MEDICAL MANAGEMENT OF HEMORRHOIDS.

## 2014-12-04 NOTE — Progress Notes (Signed)
 Subjective:    Patient ID: Yesenia Boyd, female    DOB: 07/08/1956, 58 y.o.   MRN: [id number]  HASANAJ,XAJE A, MD  HPI C/o soreness in bottom and pressure-every day. Also lower abd bloating. FEELS LIKE SHE HAS A BALL INSIDE THE CANAL. WHEN SHE HAS A BM SHE MAY HAVE TO PUSH IT BACK IN HER. NO BLEEDING THIS MONTH. NAUSEA: OFF AND AND ABD CRAMPS OFF AND ON. SWALLOWING PRETTY GOOD. HEARTBURN/REFLUX CAN BE BAD: EVERY DAY AFTER SHE EATS. BAKED PORK CHOPS/POTATOES: CREAM/BREAD/FLAVORED. NO SODAS. FRUIT JUICES/COFFEE/DECAF TEA. BMs: WITH [name]/Mg CITRATE(EVERY DAY BM).  PT DENIES FEVER, CHILLS, vomiting, melena, diarrhea, CHEST PAIN, SHORTNESS OF BREATH,  OR problems swallowing.  Past Medical History  Diagnosis Date  . IBS (irritable bowel syndrome)   . S/P endoscopy 2008, Aug 2010, Sept 2012    Dr. Anwar 2008: removal of impacted food bolus, Dr. Anwar 2010: moderate gastritis, Sept 2012 with [facility]: path with mild gastritis, no definite stricture noted, dilation with Savary 16 mm  . S/P colonoscopy April 2009, Sept 2012    normal Dr. Anwar, internal hemorrhoids, repeat in Sept 2022  . Numbness and tingling in right hand     and whole left side of body-pt sts she has "spells to where she cannot walk or talk"  . Fibromyalgia     sees Dr Sheen at [facility]  . Peripheral neuropathy   . Myasthenia gravis     right sided weakness occ.  . Sleep apnea     no cpap used, was told to get oxygen  to use  . Bile reflux gastritis    Past Surgical History  Procedure Laterality Date  . Nissen fundoplication    . Cholecystectomy    . Abdominal hysterectomy    . Esophageal surgery?      2008 [facility]  . Hand surgery      carpal tumnnel right and removal of cyst left  . Neck surgery      removal of "knot"t from neck  . Bladder surgery      stretch bladder opening  . Savory dilation  07/18/2011    Surgeon:Audryana Hockenberry M Elynor Kallenberger  . Esophageal manometry N/A 01/21/2013    Procedure: ESOPHAGEAL MANOMETRY (EM);  Surgeon:  Matthew B Martin, MD;  Location: [facility];  Service: General;  Laterality: N/A;  . Laparoscopic nissen fundoplication N/A 02/13/2013    Procedure: Redo LAPAROSCOPIC NISSEN FUNDOPLICATION;  Surgeon: Matthew B Martin, MD;  Location: [facility];  Service: General;  Laterality: N/A;  Forgut explortation with partial  takedown nissan funliplication from 1994 for wrap torsion and persistant dysphagia   Allergies  Allergen Reactions  . Avelox [Moxifloxacin Hcl In Nacl] Anaphylaxis and Itching  . Linzess [Linaclotide] Swelling    Swelling, bloating, nausea.Med used for "IBS"  . Adhesive [Tape] Other (See Comments)    Red and itchy under tape  . Cephalexin Hives and Itching  . Codeine Hives and Itching  . Cymbalta [Duloxetine Hcl] Other (See Comments)    Hair loss, nervousness, severe constipation, red blotches  . Dicyclomine Itching and Other (See Comments)    Nervousness  . Latex Itching  . Macrolides And Ketolides Swelling  . Meperidine Hcl Itching and Other (See Comments)    Nervousness  . Metronidazole Hives and Other (See Comments)    Red blotches.  . Morphine Itching and Other (See Comments)    Nervousness  . Penicillins Hives  . Prednisone Hives, Swelling and Other (See Comments)    Red   blotches - Can take with Benadryl  . Sulfonamide Derivatives Hives  . Tetracycline Other (See Comments)    Reaction unknown  . Tramadol Hcl Hives and Other (See Comments)    Nervousness    Current Outpatient Prescriptions  Medication Sig Dispense Refill  . acetaminophen (TYLENOL) 650 MG CR tablet Take 650 mg by mouth every 8 (eight) hours as needed for pain.     . albuterol (PROVENTIL HFA;VENTOLIN HFA) 108 (90 BASE) MCG/ACT inhaler Inhale 2 puffs into the lungs every 4 (four) hours as needed for shortness of breath.    . azelastine (ASTELIN) 137 MCG/SPRAY nasal spray Place 1 spray into the nose 2 (two) times daily.     . beclomethasone (QVAR) 80 MCG/ACT inhaler Inhale 2 puffs into the lungs daily.     . CARAFATE 1 GM/10ML suspension Take 1 g by mouth 4 (four) times daily.     . carbamazepine (CARBATROL) 100 MG 12 hr capsule Take 100 mg by mouth daily.     . Cholecalciferol (CVS VIT D 5000 HIGH-POTENCY) 5000 UNITS capsule Take 5,000 Units by mouth every morning.     .      . estrogens, conjugated, (PREMARIN) 0.625 MG tablet Take 0.625 mg by mouth every morning. Take daily for 21 days then do not take for 7 days.    . hydrocortisone (ANUSOL-HC) 2.5 % rectal cream Place 1 application rectally 2 (two) times daily.    . hydrOXYzine (VISTARIL) 25 MG capsule Take 25 mg by mouth at bedtime.     . L-Methylfolate-B6-B12 (METANX PO) Take 1 tablet by mouth at bedtime.     . levocetirizine (XYZAL) 5 MG tablet Take 5 mg by mouth every morning.     . lubiprostone ([name]) 24 MCG capsule Take 24 mcg by mouth 2 (two) times daily with a meal.      . magnesium citrate solution Take 296 mLs by mouth daily as needed. For blockage    . PRESCRIPTION MEDICATION Apply 1 application topically every morning. LCD10% with Salicylic Acid 2%. Use as directed for skin and rashes.    . simethicone (GAS-X) 80 MG chewable tablet Chew 80 mg by mouth every 6 (six) hours as needed. For gas    . tiZANidine (ZANAFLEX) 4 MG tablet Take 4 mg by mouth at bedtime.     . triamcinolone (KENALOG) 0.1 % cream Apply 1 application topically 2 (two) times daily as needed. For skin.    . vitamin E 1000 UNIT capsule Take 1,000 Units by mouth daily.      Review of Systems     Objective:   Physical Exam  Constitutional: She is oriented to person, place, and time. She appears well-developed and well-nourished. No distress.  HENT:  Head: Normocephalic and atraumatic.  Mouth/Throat: Oropharynx is clear and moist. No oropharyngeal exudate.  Eyes: Pupils are equal, round, and reactive to light. No scleral icterus.  Neck: Normal range of motion. Neck supple.  Cardiovascular: Normal rate, regular rhythm and normal heart sounds.     Pulmonary/Chest: Effort normal and breath sounds normal. No respiratory distress.  Abdominal: Soft. Bowel sounds are normal. She exhibits no distension. There is tenderness. There is no rebound and no guarding.  MILD TTP IN THE EPIGASTRIUM with one FINGER  Musculoskeletal: She exhibits no edema.  Neurological: She is alert and oriented to person, place, and time.  NO FOCAL DEFICITS   Psychiatric:  SLIGHTLY ANXIOUS MOOD, FLAT AFFECT   Vitals reviewed.           Assessment & Plan:   

## 2014-12-04 NOTE — Assessment & Plan Note (Addendum)
NOTES REVIEWED FROM 2012 TO PRESENT. RECTAL PAIN MOST LIKELY DUE TO HEMORRHOIDS(EXTERNAL > INTERNAL).  USE HCT CREAM NO MORE THAN 3-4 TIMES A YEAR. DISCUSSED RISKS OF USING IT EVERY DAY. DRINK WATER EAT FIBER AVOID CONSTIPATION FLEXIBLE SIGMOIDOSCOPY/POSSIBLE HEMORRHOID BANDING FEB 5-DISCUSSED PROCEDURE, BENEFITS, & RISKS: < 1% chance of medication reaction, PERFORATION, PELVIC VEIN SEPSIS, OR bleeding. FOLLOW UP IN 4 MOS.

## 2014-12-04 NOTE — Telephone Encounter (Signed)
LMOM for pt to call office back. Called to notify pt about her BPE for 12/11/2014 @ 1245pm

## 2014-12-04 NOTE — Assessment & Plan Note (Addendum)
LAST MANOMETRY 2014. LAST RE-DO NISSEN 2014. C/O SWALLOWING DIFFICULTY. NO WEIGHT LOSS. LAST EGD 2012 PERSONALLY REVIEWED.  BARIUM PILL ESOPHAGRAM NEXT WEEK. DISCUSSED PROCEDURE, BENEFITS, AND RISKS OF PROCEDURE.  FOLLOW UP IN 4 MOS.

## 2014-12-04 NOTE — Progress Notes (Signed)
ON RECALL LIST  °

## 2014-12-04 NOTE — Patient Instructions (Signed)
BARIUM SWALLOWING TEST NEXT WEEK.  USE HYDROCORTISONE CREAM NO MORE THAN 3-4 TIMES A YEAR, NOT EVERY DAY.  DRINK WATER TO KEEP YOUR URINE LIGHT YELLOW.  FOLLOW A HIGH FIBER DIET. SEE INFO BELOW.  AVOID CONSTIPATION. SEE INFO BELOW.  FLEXIBLE SIGMOIDOSCOPY/POSSIBLE HEMORRHOID BANDING FEB 5. RISKS OF THE PROCEDURE INCLUDE: < 1% chance of medication reaction, PERFORATION, PELVIC VEIN SEPSIS, OR bleeding.  FOLLOW UP IN 4 MOS.   Constipation in Adults Constipation is having fewer than 2 bowel movements per week. Usually, the stools are hard. As we grow older, constipation is more common. If you try to fix constipation with laxatives, the problem may get worse. This is because laxatives taken over a long period of time make the colon muscles weaker. A low-fiber diet, not taking in enough fluids, and taking some medicines may make these problems worse. MEDICATIONS THAT MAY CAUSE CONSTIPATION  HOME CARE INSTRUCTIONS  Constipation is usually best cared for without medicines. Increasing dietary fiber and eating more fruits and vegetables is the best way to manage constipation.   Slowly increase fiber intake to 25 to 38 grams per day. Whole grains, fruits, vegetables, and legumes are good sources of fiber. A dietitian can further help you incorporate high-fiber foods into your diet.   Drink enough water and fluids to keep your urine clear or pale yellow.   A fiber supplement may be added to your diet if you cannot get enough fiber from foods.   Increasing your activities also helps improve regularity.   Stronger measures, such as magnesium sulfate, should be avoided if possible. This may cause uncontrollable diarrhea. Using magnesium sulfate may not allow you time to make it to the bathroom.   High-Fiber Diet A high-fiber diet changes your normal diet to include more whole grains, legumes, fruits, and vegetables. Changes in the diet involve replacing refined carbohydrates with unrefined foods.  The calorie level of the diet is essentially unchanged. The Dietary Reference Intake (recommended amount) for adult males is 38 grams per day. For adult females, it is 25 grams per day. Pregnant and lactating women should consume 28 grams of fiber per day. Fiber is the intact part of a plant that is not broken down during digestion. Functional fiber is fiber that has been isolated from the plant to provide a beneficial effect in the body. PURPOSE  Increase stool bulk.   Ease and regulate bowel movements.   Lower cholesterol.  INDICATIONS THAT YOU NEED MORE FIBER  Constipation and hemorrhoids.   Uncomplicated diverticulosis (intestine condition) and irritable bowel syndrome.   Weight management.   As a protective measure against hardening of the arteries (atherosclerosis), diabetes, and cancer.   GUIDELINES FOR INCREASING FIBER IN THE DIET  Start adding fiber to the diet slowly. A gradual increase of about 5 more grams (2 slices of whole-wheat bread, 2 servings of most fruits or vegetables, or 1 bowl of high-fiber cereal) per day is best. Too rapid an increase in fiber may result in constipation, flatulence, and bloating.   Drink enough water and fluids to keep your urine clear or pale yellow. Water, juice, or caffeine-free drinks are recommended. Not drinking enough fluid may cause constipation.   Eat a variety of high-fiber foods rather than one type of fiber.   Try to increase your intake of fiber through using high-fiber foods rather than fiber pills or supplements that contain small amounts of fiber.   The goal is to change the types of food eaten. Do not  supplement your present diet with high-fiber foods, but replace foods in your present diet.  INCLUDE A VARIETY OF FIBER SOURCES  Replace refined and processed grains with whole grains, canned fruits with fresh fruits, and incorporate other fiber sources. White rice, white breads, and most bakery goods contain little or no fiber.    Brown whole-grain rice, buckwheat oats, and many fruits and vegetables are all good sources of fiber. These include: broccoli, Brussels sprouts, cabbage, cauliflower, beets, sweet potatoes, white potatoes (skin on), carrots, tomatoes, eggplant, squash, berries, fresh fruits, and dried fruits.   Cereals appear to be the richest source of fiber. Cereal fiber is found in whole grains and bran. Bran is the fiber-rich outer coat of cereal grain, which is largely removed in refining. In whole-grain cereals, the bran remains. In breakfast cereals, the largest amount of fiber is found in those with "bran" in their names. The fiber content is sometimes indicated on the label.   You may need to include additional fruits and vegetables each day.   In baking, for 1 cup white flour, you may use the following substitutions:   1 cup whole-wheat flour minus 2 tablespoons.   1/2 cup white flour plus 1/2 cup whole-wheat flour.

## 2014-12-04 NOTE — Patient Instructions (Signed)
CORIN FORMISANO  12/04/2014   Your procedure is scheduled on:  12/10/2014  Report to [facility] at  615  AM.  Call this number if you have problems the morning of surgery: [phone]   Remember:   Do not eat food or drink liquids after midnight.   Take these medicines the morning of surgery with A SIP OF WATER:  Carbanazepine, nexium, levocetirizine   Do not wear jewelry, make-up or nail polish.  Do not wear lotions, powders, or perfumes.   Do not shave 48 hours prior to surgery. Men may shave face and neck.  Do not bring valuables to the hospital.  [facility] is not responsible for any belongings or valuables.               Contacts, dentures or bridgework may not be worn into surgery.  Leave suitcase in the car. After surgery it may be brought to your room.  For patients admitted to the hospital, discharge time is determined by your treatment team.               Patients discharged the day of surgery will not be allowed to drive home.  Name and phone number of your driver: family  Special Instructions: N/A   Please read over the following fact sheets that you were given: Pain Booklet, Coughing and Deep Breathing, Surgical Site Infection Prevention, Anesthesia Post-op Instructions and Care and Recovery After Surgery Hemorrhoid Banding Hemorrhoids are veins in the anus and lower rectum that become enlarged. The most common symptoms are rectal bleeding, itching, and sometimes pain. Hemorrhoids might come out with straining or having a bowel movement, and they can sometimes be pushed back in. There are internal and external hemorrhoids. Only internal hemorrhoids can be treated with banding. In this procedure, a rubber band is placed near the hemorrhoid tissue, cutting off the blood supply. This procedure prevents the hemorrhoids from slipping down. LET YOUR CAREGIVER KNOW ABOUT: All medicines you are taking, especially blood thinners such as aspirin and coumadin.  RISKS AND  COMPLICATIONS This is not a painful procedure, but if you do have intense pain immediately let your surgeon know because the band may need to be removed. You may have some mild pain or discomfort in the first 2 days or so after treatment. Sometimes there may be delayed bleeding in the first week after treatment.  BEFORE THE PROCEDURE  There is no special preparation needed before banding. Your surgeon may have you do an enema prior to the procedure. You will go home the same day.  HOME CARE INSTRUCTIONS   Your surgeon might instruct you to do sitz baths as needed if you have discomfort or after a bowel movement.  You may be instructed to use fiber supplements. SEEK MEDICAL CARE IF:  You have an increase in pain.  Your pain does not get better. SEEK IMMEDIATE MEDICAL CARE IF:  You have intense pain.  Fever greater than 100.5 F (38.1 C).  Bleeding that does not stop, or pus from the anus. Document Released: 08/21/2009 Document Revised: 01/16/2012 Document Reviewed: 08/21/2009 [facility] Patient Information 2015 [employer], [place]. This information is not intended to replace advice given to you by your health care provider. Make sure you discuss any questions you have with your health care provider. Flexible Sigmoidoscopy Flexible sigmoidoscopy is a procedure to check your lower colon (sigmoid colon). The procedure is done using a short, flexible tube that has a tiny  camera attached (sigmoidoscope). The sigmoidoscope is inserted into your anus and passed through your rectum into your sigmoid colon. The camera on the scope sends images to a television monitor.  You may need this exam if you have had changes in your bowel habits, bleeding from your rectum, or abdominal pain. You may also need this test if your health care provider wants to check for abnormal growths in your rectum or lower colon. LET [facility] CARE PROVIDER KNOW ABOUT:  Any allergies you have.  All medicines you are taking,  including vitamins, herbs, eye drops, creams, and over-the-counter medicines.  Previous problems you or members of your family have had with the use of anesthetics.  Any blood disorders you have.  Previous surgeries you have had.  Medical conditions you have. RISKS AND COMPLICATIONS Generally, this is a safe procedure. However, as with any procedure, problems can occur. Possible problems include:  Abdominal pain.  Bleeding.  Infection or inflammation in your colon.  A tear through your rectum or colon (perforation). BEFORE THE PROCEDURE  You will need to follow a special diet and use a laxative or an enema before the procedure (bowel prep). This is to clean out your colon. You may need to start your bowel prep 1-3 days before the procedure. Follow your health care provider's instructions exactly.  Do not eat or drink anything after midnight the night before the procedure or as directed by your health care provider.  You may need to have a rectal suppository or enema in the morning before your procedure.  Arrange for someone to take you home after the procedure. PROCEDURE  Sigmoidoscopy takes about 20 minutes and may include the following steps:  You may get a medicine through an IV tube to make you relaxed and drowsy (sedation).  You will lie on your side on the examination table.  The sigmoidoscope will be lubricated and gently inserted into your anus.  Air will be injected into your colon and the sigmoidoscope will be moved into your sigmoid colon. You may feel some pressure or cramping.  You may be asked to change positions at times during the procedure.  Your health care provider will check the monitor for any abnormal findings.  Your health care provider may also want to take a small piece of tissue to check under a microscope (biopsy).  Your health care provider takes a final look at your sigmoid colon and rectum as the scope is slowly removed. AFTER THE PROCEDURE   You will stay in a recovery area for a short while until your health care provider says you can go home. Document Released: 10/21/2000 Document Revised: 10/29/2013 Document Reviewed: 09/26/2013 [facility] Patient Information 2015 [place], [place]. This information is not intended to replace advice given to you by your health care provider. Make sure you discuss any questions you have with your health care provider. PATIENT INSTRUCTIONS POST-ANESTHESIA  IMMEDIATELY FOLLOWING SURGERY:  Do not drive or operate machinery for the first twenty four hours after surgery.  Do not make any important decisions for twenty four hours after surgery or while taking narcotic pain medications or sedatives.  If you develop intractable nausea and vomiting or a severe headache please notify your doctor immediately.  FOLLOW-UP:  Please make an appointment with your surgeon as instructed. You do not need to follow up with anesthesia unless specifically instructed to do so.  WOUND CARE INSTRUCTIONS (if applicable):  Keep a dry clean dressing on the anesthesia/puncture wound site if there  is drainage.  Once the wound has quit draining you may leave it open to air.  Generally you should leave the bandage intact for twenty four hours unless there is drainage.  If the epidural site drains for more than 36-48 hours please call the anesthesia department.  QUESTIONS?:  Please feel free to call your physician or the hospital operator if you have any questions, and they will be happy to assist you.

## 2014-12-05 ENCOUNTER — Encounter (HOSPITAL_COMMUNITY): Payer: Self-pay

## 2014-12-05 ENCOUNTER — Encounter (HOSPITAL_COMMUNITY)
Admission: RE | Admit: 2014-12-05 | Discharge: 2014-12-05 | Disposition: A | Discharge status: Home / Self Care | Payer: 59 | Source: Ambulatory Visit | Attending: Gastroenterology | Admitting: Gastroenterology

## 2014-12-05 DIAGNOSIS — Z01818 Encounter for other preprocedural examination: Secondary | ICD-10-CM | POA: Diagnosis not present

## 2014-12-05 DIAGNOSIS — K6289 Other specified diseases of anus and rectum: Secondary | ICD-10-CM | POA: Insufficient documentation

## 2014-12-05 DIAGNOSIS — K625 Hemorrhage of anus and rectum: Secondary | ICD-10-CM | POA: Diagnosis not present

## 2014-12-05 HISTORY — DX: Transient cerebral ischemic attack, unspecified: G45.9

## 2014-12-05 HISTORY — DX: Myasthenia gravis without (acute) exacerbation: G70.00

## 2014-12-05 HISTORY — DX: Polyneuropathy, unspecified: G62.9

## 2014-12-05 HISTORY — DX: Unspecified asthma, uncomplicated: J45.909

## 2014-12-05 HISTORY — DX: Gastro-esophageal reflux disease without esophagitis: K21.9

## 2014-12-05 LAB — CBC
HCT: 32.4 % — ABNORMAL LOW (ref 36.0–46.0)
Hemoglobin: 10.9 g/dL — ABNORMAL LOW (ref 12.0–15.0)
MCH: 26.6 pg (ref 26.0–34.0)
MCHC: 33.6 g/dL (ref 30.0–36.0)
MCV: 79 fL (ref 78.0–100.0)
Platelets: 213 10*3/uL (ref 150–400)
RBC: 4.1 MIL/uL (ref 3.87–5.11)
RDW: 12.9 % (ref 11.5–15.5)
WBC: 4 10*3/uL (ref 4.0–10.5)

## 2014-12-05 LAB — BASIC METABOLIC PANEL
Anion gap: 5 (ref 5–15)
BUN: 9 mg/dL (ref 6–23)
CO2: 28 mmol/L (ref 19–32)
Calcium: 9.4 mg/dL (ref 8.4–10.5)
Chloride: 104 mmol/L (ref 96–112)
Creatinine, Ser: 0.72 mg/dL (ref 0.50–1.10)
GFR calc Af Amer: >90 mL/min (ref >90)
GFR calc non Af Amer: >90 mL/min (ref >90)
Glucose, Bld: 87 mg/dL (ref 70–99)
Potassium: 4 mmol/L (ref 3.5–5.1)
Sodium: 137 mmol/L (ref 135–145)

## 2014-12-09 ENCOUNTER — Encounter (HOSPITAL_COMMUNITY)
Admission: RE | Disposition: A | Discharge status: Home / Self Care | Payer: Self-pay | Source: Ambulatory Visit | Attending: Gastroenterology

## 2014-12-09 ENCOUNTER — Ambulatory Visit (HOSPITAL_COMMUNITY)
Admission: RE | Admit: 2014-12-09 | Discharge: 2014-12-09 | Disposition: A | Discharge status: Home / Self Care | Payer: 59 | Source: Ambulatory Visit | Attending: Gastroenterology | Admitting: Gastroenterology

## 2014-12-09 ENCOUNTER — Encounter (HOSPITAL_COMMUNITY): Payer: Self-pay | Admitting: *Deleted

## 2014-12-09 ENCOUNTER — Ambulatory Visit (HOSPITAL_COMMUNITY): Payer: 59 | Admitting: Anesthesiology

## 2014-12-09 DIAGNOSIS — K644 Residual hemorrhoidal skin tags: Secondary | ICD-10-CM | POA: Insufficient documentation

## 2014-12-09 DIAGNOSIS — Z885 Allergy status to narcotic agent status: Secondary | ICD-10-CM | POA: Diagnosis not present

## 2014-12-09 DIAGNOSIS — M797 Fibromyalgia: Secondary | ICD-10-CM | POA: Diagnosis not present

## 2014-12-09 DIAGNOSIS — Z881 Allergy status to other antibiotic agents status: Secondary | ICD-10-CM | POA: Insufficient documentation

## 2014-12-09 DIAGNOSIS — Z91040 Latex allergy status: Secondary | ICD-10-CM | POA: Insufficient documentation

## 2014-12-09 DIAGNOSIS — Z880 Allergy status to penicillin: Secondary | ICD-10-CM | POA: Diagnosis not present

## 2014-12-09 DIAGNOSIS — K649 Unspecified hemorrhoids: Secondary | ICD-10-CM

## 2014-12-09 DIAGNOSIS — Z888 Allergy status to other drugs, medicaments and biological substances status: Secondary | ICD-10-CM | POA: Diagnosis not present

## 2014-12-09 DIAGNOSIS — Z882 Allergy status to sulfonamides status: Secondary | ICD-10-CM | POA: Diagnosis not present

## 2014-12-09 DIAGNOSIS — G629 Polyneuropathy, unspecified: Secondary | ICD-10-CM | POA: Diagnosis not present

## 2014-12-09 DIAGNOSIS — Z886 Allergy status to analgesic agent status: Secondary | ICD-10-CM | POA: Insufficient documentation

## 2014-12-09 DIAGNOSIS — K648 Other hemorrhoids: Secondary | ICD-10-CM | POA: Insufficient documentation

## 2014-12-09 DIAGNOSIS — K625 Hemorrhage of anus and rectum: Secondary | ICD-10-CM | POA: Insufficient documentation

## 2014-12-09 HISTORY — PX: HEMORRHOID BANDING: SHX5850

## 2014-12-09 HISTORY — PX: FLEXIBLE SIGMOIDOSCOPY: SHX5431

## 2014-12-09 SURGERY — SIGMOIDOSCOPY, FLEXIBLE
Anesthesia: Monitor Anesthesia Care

## 2014-12-09 MED ORDER — FENTANYL CITRATE 0.05 MG/ML IJ SOLN: 25.0000 ug | INTRAMUSCULAR | Status: DC | PRN

## 2014-12-09 MED ORDER — ALBUTEROL SULFATE (2.5 MG/3ML) 0.083% IN NEBU
2.5000 mg | INHALATION_SOLUTION | Freq: Once | RESPIRATORY_TRACT | Status: AC
  Administered 2014-12-09: 2.5 mg via RESPIRATORY_TRACT

## 2014-12-09 MED ORDER — ONDANSETRON HCL 4 MG/2ML IJ SOLN
4.0000 mg | Freq: Once | INTRAMUSCULAR | Status: AC
  Administered 2014-12-09: 4 mg via INTRAVENOUS

## 2014-12-09 MED ORDER — ONDANSETRON HCL 4 MG/2ML IJ SOLN
INTRAMUSCULAR | Status: AC
  Filled 2014-12-09: qty 2

## 2014-12-09 MED ORDER — LACTATED RINGERS IV SOLN
INTRAVENOUS | Status: DC
  Administered 2014-12-09: 10:00:00 via INTRAVENOUS

## 2014-12-09 MED ORDER — MIDAZOLAM HCL 2 MG/2ML IJ SOLN
1.0000 mg | INTRAMUSCULAR | Status: DC | PRN
  Administered 2014-12-09: 2 mg via INTRAVENOUS

## 2014-12-09 MED ORDER — STERILE WATER FOR IRRIGATION IR SOLN
Status: DC | PRN
  Administered 2014-12-09: 1000 mL

## 2014-12-09 MED ORDER — FENTANYL CITRATE 0.05 MG/ML IJ SOLN
INTRAMUSCULAR | Status: DC | PRN
  Administered 2014-12-09: 50 ug via INTRAVENOUS
  Administered 2014-12-09 (×2): 25 ug via INTRAVENOUS

## 2014-12-09 MED ORDER — PROPOFOL 10 MG/ML IV BOLUS
INTRAVENOUS | Status: DC | PRN
  Administered 2014-12-09: 20 mg via INTRAVENOUS

## 2014-12-09 MED ORDER — FENTANYL CITRATE 0.05 MG/ML IJ SOLN
25.0000 ug | INTRAMUSCULAR | Status: AC
  Administered 2014-12-09 (×2): 25 ug via INTRAVENOUS

## 2014-12-09 MED ORDER — MIDAZOLAM HCL 2 MG/2ML IJ SOLN
INTRAMUSCULAR | Status: AC
  Filled 2014-12-09: qty 2

## 2014-12-09 MED ORDER — GLYCOPYRROLATE 0.2 MG/ML IJ SOLN
0.2000 mg | Freq: Once | INTRAMUSCULAR | Status: AC
  Administered 2014-12-09: 0.2 mg via INTRAVENOUS

## 2014-12-09 MED ORDER — PROPOFOL INFUSION 10 MG/ML OPTIME
INTRAVENOUS | Status: DC | PRN
  Administered 2014-12-09: 125 ug/kg/min via INTRAVENOUS

## 2014-12-09 MED ORDER — PROPOFOL 10 MG/ML IV BOLUS
INTRAVENOUS | Status: AC
Start: 2014-12-09 — End: 2014-12-09
  Filled 2014-12-09: qty 20

## 2014-12-09 MED ORDER — ALBUTEROL SULFATE (2.5 MG/3ML) 0.083% IN NEBU
INHALATION_SOLUTION | RESPIRATORY_TRACT | Status: AC
  Filled 2014-12-09: qty 3

## 2014-12-09 MED ORDER — SODIUM CHLORIDE 0.9 % IN NEBU
INHALATION_SOLUTION | RESPIRATORY_TRACT | Status: AC
  Filled 2014-12-09: qty 3

## 2014-12-09 MED ORDER — GLYCOPYRROLATE 0.2 MG/ML IJ SOLN
INTRAMUSCULAR | Status: AC
  Filled 2014-12-09: qty 1

## 2014-12-09 MED ORDER — FENTANYL CITRATE 0.05 MG/ML IJ SOLN
INTRAMUSCULAR | Status: AC
  Filled 2014-12-09: qty 2

## 2014-12-09 MED ORDER — ONDANSETRON HCL 4 MG/2ML IJ SOLN: 4.0000 mg | Freq: Once | INTRAMUSCULAR | Status: DC | PRN

## 2014-12-09 SURGICAL SUPPLY — 21 items
ELECT REM PT RETURN 9FT ADLT (ELECTROSURGICAL) ×2
ELECTRODE REM PT RTRN 9FT ADLT (ELECTROSURGICAL) ×1 IMPLANT
FCP BXJMBJMB 240X2.8X (CUTTING FORCEPS)
FLOOR PAD 36X40 (MISCELLANEOUS) ×2
FORCEPS BIOP RAD 4 LRG CAP 4 (CUTTING FORCEPS) ×1 IMPLANT
FORCEPS BIOP RJ4 240 W/NDL (CUTTING FORCEPS)
FORCEPS BXJMBJMB 240X2.8X (CUTTING FORCEPS) ×1 IMPLANT
INJECTOR/SNARE I SNARE (MISCELLANEOUS) ×1 IMPLANT
LUBRICANT JELLY 4.5OZ STERILE (MISCELLANEOUS) ×2 IMPLANT
NDL SCLEROTHERAPY 25GX240 (NEEDLE) ×1 IMPLANT
NEEDLE SCLEROTHERAPY 25GX240 (NEEDLE) IMPLANT
PAD FLOOR 36X40 (MISCELLANEOUS) ×1 IMPLANT
PROBE APC STR FIRE (PROBE) ×1 IMPLANT
PROBE INJECTION GOLD (MISCELLANEOUS)
PROBE INJECTION GOLD 7FR (MISCELLANEOUS) ×1 IMPLANT
SNARE SHORT THROW 13M SML OVAL (MISCELLANEOUS) ×1 IMPLANT
SPEEDBAND SUPERVIEW SUPER 7 ×1 IMPLANT
SYR 50ML LL SCALE MARK (SYRINGE) ×1 IMPLANT
TRAP SPECIMEN MUCOUS 40CC (MISCELLANEOUS) ×1 IMPLANT
TUBING ENDO SMARTCAP PENTAX (MISCELLANEOUS) ×2 IMPLANT
TUBING IRRIGATION ENDOGATOR (MISCELLANEOUS) ×2 IMPLANT

## 2014-12-09 NOTE — Discharge Instructions (Signed)
You have internal and external hemorrhoids. I placed 3 bands ON YOUR INTERNAL HEMORRHOIDS to treat your rectal bleeding.     CALL [phone] IF YOUR RECTAL BLEEDING OR RECTAL PAIN GETS WORSE OR IF YOU HAVE FEVER, OR PROBLEMS URINATING.  FOLLOW A LOW RESIDUE DIET UFOR 2 WEEKS.  DRINK WATER TO KEEP YOUR URINE LIGHT YELLOW.  AVOID STRAINING AND CONSTIPATION.  USE COLACE ONE TO THREE TIMES A DAY TO SOFTEN STOOL. HOLD FOR DIARRHEA.  You CAN USE YOUR RECTAL CREAM AS NEEDED FOR RECTAL PAIN, OR ITCHING.   FOLLOW UP IN 3 WEEKS.      ENDOSCOPY Care After Read the instructions outlined below and refer to this sheet in the next week. These discharge instructions provide you with general information on caring for yourself after you leave the hospital. While your treatment has been planned according to the most current medical practices available, unavoidable complications occasionally occur. If you have any problems or questions after discharge, call DR. , [phone].  ACTIVITY  You may resume your regular activity, but move at a slower pace for the next 24 hours.   Take frequent rest periods for the next 24 hours.   Walking will help get rid of the air and reduce the bloated feeling in your belly (abdomen).   No driving for 24 hours (because of the medicine (anesthesia) used during the test).   You may shower.   Do not sign any important legal documents or operate any machinery for 24 hours (because of the anesthesia used during the test).    NUTRITION  Drink plenty of fluids.   You may resume your normal diet as instructed by your doctor.   Begin with a light meal and progress to your normal diet. Heavy or fried foods are harder to digest and may make you feel sick to your stomach (nauseated).   Avoid alcoholic beverages for 24 hours or as instructed.    MEDICATIONS  You may resume your normal medications.   WHAT YOU CAN EXPECT TODAY  Some feelings of  bloating in the abdomen.   Passage of more gas than usual.   Spotting of blood in your stool or on the toilet paper  .  IF YOU HADBIOPSIES TAKEN  DURING THE SIGMOIDOSCOPY/UPPER ENDOSCOPY:  Eat a soft diet IF YOU HAVE NAUSEA, BLOATING, ABDOMINAL PAIN, OR VOMITING.    FINDING OUT THE RESULTS OF YOUR TEST Not all test results are available during your visit. DR. Oneida Alar WILL CALL YOU WITHIN 14 DAYS OF YOUR PROCEDUE WITH YOUR RESULTS. Do not assume everything is normal if you have not heard from DR. , CALL HER OFFICE AT [phone].  SEEK IMMEDIATE MEDICAL ATTENTION AND CALL THE OFFICE: [phone] IF:  You have more than a spotting of blood in your stool.   Your belly is swollen (abdominal distention).   You are nauseated or vomiting.   You have a temperature over 101F.   You have abdominal pain or discomfort that is severe or gets worse throughout the day.    HEMORRHOIDAL BANDING COMPLICATIONS:  COMMON: 1. MINOR PAIN  UNCOMMON: 1. ABSCESS 2. BAND FALLS OFF 3. PROLAPSE OF HEMORRHOIDS AND PAIN 4. ULCER BLEEDING  A. USUALLY SELF-LIMITED: MAY LAST 3-5 DAYS  B. MAY REQUIRE INTERVENTION: 1-2 WEEKS AFTER INTERACTIONS 5. NECROTIZING PELVIC SEPSIS  A. SYMPTOMS: FEVER, PAIN, DIFFICULTY URINATING   Low Fiber and Residue Restricted Diet A low fiber diet restricts foods that contain carbohydrates that are not digested in the small intestine.  A diet containing about 10 g of fiber is considered low fiber. The diet needs to be individualized to suit patient tolerances and preferences and to avoid unnecessary restrictions. Generally, the foods emphasized in a low fiber diet have no skins or seeds. They may have been processed to remove bran, germ, or husks. Cooking may not necessarily eliminate the fiber. Cooking may, in fact, enable a greater quantity of fiber to be consumed in a lesser volume. Legumes and nuts are also restricted. The term low residue has also been used to  describe low fiber diets, although the two are not the same. Residue refers to any substance that adds to bowel (colonic) contents, such as sloughed cells and intestinal bacteria, in addition to fiber. Residue-containing foods, prunes and prune juice, milk, and connective tissue from meats may also need to be eliminated. It is important to eliminate these foods during sudden (acute) attacks of inflammatory bowel disease, when there is a partial obstruction due to another reason, or when minimal fecal output is desired. When these problems are gone, a more normal diet may be used.  PURPOSE  Reduce stool weight and volume.   CHOOSING FOODS Check labels, especially on foods from the starch list. Often, dietary fiber content is listed with the Nutrition Facts panel.  Breads and Starches  Allowed: White, [place], and pita breads, plain rolls, buns, or sweet rolls, doughnuts, waffles, pancakes, bagels. Plain muffins, sweet breads, biscuits, matzoth. Flour. Soda, saltine, or graham crackers. Pretzels, rusks, melba toast, zwieback. Cooked cereals: cornmeal, farina, cream cereals. Dry cereals: refined corn, wheat, rice, and oat cereals (check label). Potatoes prepared any way without skins, refined macaroni, spaghetti, noodles, refined rice.   Avoid: Bread, rolls, or crackers made with whole-wheat, multigrains, rye, bran seeds, nuts, or coconut. Corn tortillas, table-shells. Corn chips, tortilla chips. Cereals containing whole-grains, multigrains, bran, coconut, nuts, or raisins. Cooked or dry oatmeal. Coarse wheat cereals, granola. Cereals advertised as "high fiber." Potato skins. Whole-grain pasta, wild or brown rice. Popcorn.  Vegetables  Allowed:  Strained tomato and vegetable juices. Fresh: tender lettuce, cucumber, cabbage, spinach, bean sprouts. Cooked, canned: asparagus, bean sprouts, cut green or wax beans, cauliflower, pumpkin, beets, mushrooms, olives, spinach, yellow squash, tomato, tomato sauce (no  seeds), zucchini (peeled), turnips. Canned sweet potatoes. Small amounts of celery, onion, radish, and green pepper may be used. Keep servings limited to  cup.   Avoid: Fresh, cooked, or canned: artichokes, baked beans, beet greens, broccoli, Brussels sprouts, French-style green beans, corn, kale, legumes, peas, sweet potatoes. Cooked: green or red cabbage, spinach. Avoid large servings of any vegetables.  Fruit  Allowed:  All fruit juices except prune juice. Cooked or canned: apricots applesauce, cantaloupe, cherries, grapefruit, grapes, kiwi, mandarin oranges, peaches, pears, fruit cocktail, pineapple, plums, watermelon. Fresh: banana, grapes, cantaloupe, avocado, cherries, pineapple, grapefruit, kiwi, nectarines, peaches, oranges, blueberries, plums. Keep servings limited to  cup or 1 piece.   Avoid: Fresh: apple with or without skin, apricots, mango, pears, raspberries, strawberries. Prune juice, stewed or dried prunes. Dried fruits, raisins, dates. Avoid large servings of all fresh fruits.  Meat and Meat Substitutes  Allowed:  Ground or well-cooked tender beef, ham, veal, lamb, pork, or poultry. Eggs, plain cheese. Fish, oysters, shrimp, lobster, other seafood. Liver, organ meats.   Avoid: Tough, fibrous meats with gristle. Peanut butter, smooth or chunky. Cheese with seeds, nuts, or other foods not allowed. Nuts, seeds, legumes, dried peas, beans, lentils.  Milk  Allowed:  All milk products except those not  allowed. Milk and milk product consumption should be minimal when low residue is desired.   Avoid: Yogurt that contains nuts or seeds.  Soups and Combination Foods  Allowed:  Bouillon, broth, or cream soups made from allowed foods. Any strained soup. Casseroles or mixed dishes made with allowed foods.   Avoid: Soups made from vegetables that are not allowed or that contain other foods not allowed.  Desserts and Sweets  Allowed:  Plain cakes and cookies, pie made with allowed  fruit, pudding, custard, cream pie. Gelatin, fruit, ice, sherbet, frozen ice pops. Ice cream, ice milk without nuts. Plain hard candy, honey, jelly, molasses, syrup, sugar, chocolate syrup, gumdrops, marshmallows.   Avoid: Desserts, cookies, or candies that contain nuts, peanut butter, or dried fruits. Jams, preserves with seeds, marmalade.  Fats and Oils  Allowed:  Margarine, butter, cream, mayonnaise, salad oils, plain salad dressings made from allowed foods. Plain gravy, crisp bacon without rind.   Avoid: Seeds, nuts, olives. Avocados.  Beverages  Allowed:  All, except those listed to avoid.   Avoid: Fruit juices with high pulp, prune juice.  Condiments  Allowed:  Ketchup, mustard, horseradish, vinegar, cream sauce, cheese sauce, cocoa powder. Spices in moderation: allspice, basil, bay leaves, celery powder or leaves, cinnamon, cumin powder, curry powder, ginger, mace, marjoram, onion or garlic powder, oregano, paprika, parsley flakes, ground pepper, rosemary, sage, savory, tarragon, thyme, turmeric.   Avoid: Coconut, pickles.  SAMPLE MEAL PLAN The following menu is provided as a sample. Your daily menu plans will vary. Be sure to include a minimum of the following each day in order to provide essential nutrients for the adult:  Starch/Bread/Cereal Group, 6 servings.   Fruit/Vegetable Group, 5 servings.   Meat/Meat Substitute Group, 2 servings.   Milk/Milk Substitute Group, 2 servings.  A serving is equal to  cup for fruits, vegetables, and cooked cereals or 1 piece for foods such as a piece of bread, 1 orange, or 1 apple. For dry cereals and crackers, use serving sizes listed on the label. Combination foods may count as full or partial servings from various food groups. Fats, desserts, and sweets may be added to the meal plan after the requirements for essential nutrients are met. SAMPLE MENU Breakfast   cup orange juice.   1 boiled egg.   1 slice white toast.   Margarine.     cup cornflakes.   1 cup milk.   Beverage.  Lunch   cup chicken noodle soup.   2 to 3 oz sliced roast beef.   2 slices seedless rye bread.   Mayonnaise.    cup tomato juice.   1 small banana.   Beverage.  Dinner  3 oz baked chicken.    cup scalloped potatoes.    cup cooked beets.   White dinner roll.   Margarine.    cup canned peaches.   Beverage.

## 2014-12-09 NOTE — Transfer of Care (Signed)
Immediate Anesthesia Transfer of Care Note  Patient: Yesenia Boyd  Procedure(s) Performed: Procedure(s): FLEXIBLE SIGMOIDOSCOPY WITH PROPOFOL (N/A) HEMORRHOID BANDING (N/A)  Patient Location: PACU  Anesthesia Type:MAC  Level of Consciousness: awake, alert  and oriented  Airway & Oxygen Therapy: Patient Spontanous Breathing  Post-op Assessment: Report given to RN  Post vital signs: Reviewed  Last Vitals:  Filed Vitals:   12/09/14 1045  BP: 109/73  Resp: 12    Complications: No apparent anesthesia complications

## 2014-12-09 NOTE — Anesthesia Postprocedure Evaluation (Signed)
  Anesthesia Post-op Note  Patient: Yesenia Boyd  Procedure(s) Performed: Procedure(s): FLEXIBLE SIGMOIDOSCOPY WITH PROPOFOL (N/A) HEMORRHOID BANDING (N/A)  Patient Location: PACU  Anesthesia Type:MAC  Level of Consciousness: awake, alert  and oriented  Airway and Oxygen Therapy: Patient Spontanous Breathing  Post-op Pain: none  Post-op Assessment: Post-op Vital signs reviewed, Patient's Cardiovascular Status Stable, Respiratory Function Stable, Patent Airway and No signs of Nausea or vomiting  Post-op Vital Signs: Reviewed and stable  Last Vitals:  Filed Vitals:   12/09/14 1045  BP: 109/73  Resp: 12    Complications: No apparent anesthesia complications

## 2014-12-09 NOTE — Anesthesia Preprocedure Evaluation (Signed)
Anesthesia Evaluation  Patient identified by MRN, date of birth, ID band Patient awake    Reviewed: Allergy & Precautions, H&P , NPO status , Patient's Chart, lab work & pertinent test results  History of Anesthesia Complications (+) history of anesthetic complications (difficulty sedating)  Airway Mallampati: I   Neck ROM: Full    Dental  (+) Teeth Intact, Partial Upper, Partial Lower   Pulmonary asthma , sleep apnea ,  O2 at night  hoarseness        Cardiovascular negative cardio ROS  Rhythm:Regular Rate:Normal     Neuro/Psych Peripheral neuropathy  Neuromuscular disease (question of myasthenia gravis, atypical presentation [facility]  [facility]. Off meds)    GI/Hepatic GERD-  Medicated,  Endo/Other    Renal/GU      Musculoskeletal  (+) Fibromyalgia -  Abdominal   Peds  Hematology   Anesthesia Other Findings   Reproductive/Obstetrics                             Anesthesia Physical Anesthesia Plan  ASA: III  Anesthesia Plan: MAC   Post-op Pain Management:    Induction: Intravenous  Airway Management Planned: Simple Face Mask  Additional Equipment:   Intra-op Plan:   Post-operative Plan:   Informed Consent: I have reviewed the patients History and Physical, chart, labs and discussed the procedure including the risks, benefits and alternatives for the proposed anesthesia with the patient or authorized representative who has indicated his/her understanding and acceptance.     Plan Discussed with: CRNA  Anesthesia Plan Comments:         Anesthesia Quick Evaluation

## 2014-12-09 NOTE — H&P (View-Only) (Signed)
Subjective:    Patient ID: Yesenia Boyd, female    DOB: Dec 07, 1955, 59 y.o.   MRN: [id number]  HASANAJ,XAJE A, MD  HPI C/o soreness in bottom and pressure-every day. Also lower abd bloating. FEELS LIKE SHE HAS A BALL INSIDE THE CANAL. WHEN SHE HAS A BM SHE MAY HAVE TO PUSH IT BACK IN HER. NO BLEEDING THIS MONTH. NAUSEA: OFF AND AND ABD CRAMPS OFF AND ON. SWALLOWING PRETTY GOOD. HEARTBURN/REFLUX CAN BE BAD: EVERY DAY AFTER SHE EATS. BAKED PORK CHOPS/POTATOES: CREAM/BREAD/FLAVORED. NO SODAS. FRUIT JUICES/COFFEE/DECAF TEA. BMs: WITH [name]/Mg CITRATE(EVERY DAY BM).  PT DENIES FEVER, CHILLS, vomiting, melena, diarrhea, CHEST PAIN, SHORTNESS OF BREATH,  OR problems swallowing.  Past Medical History  Diagnosis Date  . IBS (irritable bowel syndrome)   . S/P endoscopy 2008, Aug 2010, Sept 2012    Dr. Linna Darner 2008: removal of impacted food bolus, Dr. Linna Darner 2010: moderate gastritis, Sept 2012 with SLF: path with mild gastritis, no definite stricture noted, dilation with Savary 16 mm  . S/P colonoscopy April 2009, Sept 2012    normal Dr. Linna Darner, internal hemorrhoids, repeat in Sept 2022  . Numbness and tingling in right hand     and whole left side of body-pt sts she has "spells to where she cannot walk or talk"  . Fibromyalgia     sees Dr Philis Kendall at [facility]  . Peripheral neuropathy   . Myasthenia gravis     right sided weakness occ.  . Sleep apnea     no cpap used, was told to get oxygen  to use  . Bile reflux gastritis    Past Surgical History  Procedure Laterality Date  . Nissen fundoplication    . Cholecystectomy    . Abdominal hysterectomy    . Esophageal surgery?      2008 [facility]  . Hand surgery      carpal tumnnel right and removal of cyst left  . Neck surgery      removal of "knot"t from neck  . Bladder surgery      stretch bladder opening  . Savory dilation  07/18/2011    Surgeon:Lynell Greenhouse M Andrell Bergeson  . Esophageal manometry N/A 01/21/2013    Procedure: ESOPHAGEAL MANOMETRY (EM);  Surgeon:  Valarie Merino, MD;  Location: [facility];  Service: General;  Laterality: N/A;  . Laparoscopic nissen fundoplication N/A 02/13/2013    Procedure: Redo LAPAROSCOPIC NISSEN FUNDOPLICATION;  Surgeon: Valarie Merino, MD;  Location: [facility];  Service: General;  Laterality: N/A;  Forgut explortation with partial  takedown nissan funliplication from 1994 for wrap torsion and persistant dysphagia   Allergies  Allergen Reactions  . Avelox [Moxifloxacin Hcl In Nacl] Anaphylaxis and Itching  . Linzess [Linaclotide] Swelling    Swelling, bloating, nausea.Med used for "IBS"  . Adhesive [Tape] Other (See Comments)    Red and itchy under tape  . Cephalexin Hives and Itching  . Codeine Hives and Itching  . Cymbalta [Duloxetine Hcl] Other (See Comments)    Hair loss, nervousness, severe constipation, red blotches  . Dicyclomine Itching and Other (See Comments)    Nervousness  . Latex Itching  . Macrolides And Ketolides Swelling  . Meperidine Hcl Itching and Other (See Comments)    Nervousness  . Metronidazole Hives and Other (See Comments)    Red blotches.  . Morphine Itching and Other (See Comments)    Nervousness  . Penicillins Hives  . Prednisone Hives, Swelling and Other (See Comments)    Red  blotches - Can take with Benadryl  . Sulfonamide Derivatives Hives  . Tetracycline Other (See Comments)    Reaction unknown  . Tramadol Hcl Hives and Other (See Comments)    Nervousness    Current Outpatient Prescriptions  Medication Sig Dispense Refill  . acetaminophen (TYLENOL) 650 MG CR tablet Take 650 mg by mouth every 8 (eight) hours as needed for pain.     Marland Kitchen. albuterol (PROVENTIL HFA;VENTOLIN HFA) 108 (90 BASE) MCG/ACT inhaler Inhale 2 puffs into the lungs every 4 (four) hours as needed for shortness of breath.    Marland Kitchen. azelastine (ASTELIN) 137 MCG/SPRAY nasal spray Place 1 spray into the nose 2 (two) times daily.     . beclomethasone (QVAR) 80 MCG/ACT inhaler Inhale 2 puffs into the lungs daily.     Marland Kitchen. CARAFATE 1 GM/10ML suspension Take 1 g by mouth 4 (four) times daily.     . carbamazepine (CARBATROL) 100 MG 12 hr capsule Take 100 mg by mouth daily.     . Cholecalciferol (CVS VIT D 5000 HIGH-POTENCY) 5000 UNITS capsule Take 5,000 Units by mouth every morning.     .      . estrogens, conjugated, (PREMARIN) 0.625 MG tablet Take 0.625 mg by mouth every morning. Take daily for 21 days then do not take for 7 days.    . hydrocortisone (ANUSOL-HC) 2.5 % rectal cream Place 1 application rectally 2 (two) times daily.    . hydrOXYzine (VISTARIL) 25 MG capsule Take 25 mg by mouth at bedtime.     Marland Kitchen. L-Methylfolate-B6-B12 (METANX PO) Take 1 tablet by mouth at bedtime.     Marland Kitchen. levocetirizine (XYZAL) 5 MG tablet Take 5 mg by mouth every morning.     . lubiprostone ([name]) 24 MCG capsule Take 24 mcg by mouth 2 (two) times daily with a meal.      . magnesium citrate solution Take 296 mLs by mouth daily as needed. For blockage    . PRESCRIPTION MEDICATION Apply 1 application topically every morning. LCD10% with Salicylic Acid 2%. Use as directed for skin and rashes.    . simethicone (GAS-X) 80 MG chewable tablet Chew 80 mg by mouth every 6 (six) hours as needed. For gas    . tiZANidine (ZANAFLEX) 4 MG tablet Take 4 mg by mouth at bedtime.     . triamcinolone (KENALOG) 0.1 % cream Apply 1 application topically 2 (two) times daily as needed. For skin.    Marland Kitchen. vitamin E 1000 UNIT capsule Take 1,000 Units by mouth daily.      Review of Systems     Objective:   Physical Exam  Constitutional: She is oriented to person, place, and time. She appears well-developed and well-nourished. No distress.  HENT:  Head: Normocephalic and atraumatic.  Mouth/Throat: Oropharynx is clear and moist. No oropharyngeal exudate.  Eyes: Pupils are equal, round, and reactive to light. No scleral icterus.  Neck: Normal range of motion. Neck supple.  Cardiovascular: Normal rate, regular rhythm and normal heart sounds.     Pulmonary/Chest: Effort normal and breath sounds normal. No respiratory distress.  Abdominal: Soft. Bowel sounds are normal. She exhibits no distension. There is tenderness. There is no rebound and no guarding.  MILD TTP IN THE EPIGASTRIUM with one FINGER  Musculoskeletal: She exhibits no edema.  Neurological: She is alert and oriented to person, place, and time.  NO FOCAL DEFICITS   Psychiatric:  SLIGHTLY ANXIOUS MOOD, FLAT AFFECT   Vitals reviewed.  Assessment & Plan:

## 2014-12-09 NOTE — Interval H&P Note (Signed)
History and Physical Interval Note:  12/09/2014 10:39 AM  Yesenia SonsStella L Dowis  has presented today for surgery, with the diagnosis of rectal pain/bleeding  The various methods of treatment have been discussed with the patient and family. After consideration of risks, benefits and other options for treatment, the patient has consented to  Procedure(s) with comments: FLEXIBLE SIGMOIDOSCOPY WITH PROPOFOL (N/A) - 115pm HEMORRHOID BANDING (N/A) as a surgical intervention .  The patient's history has been reviewed, patient examined, no change in status, stable for surgery.  I have reviewed the patient's chart and labs.  Questions were answered to the patient's satisfaction.     Eaton CorporationSandi Ryin Schillo

## 2014-12-10 ENCOUNTER — Encounter (HOSPITAL_COMMUNITY): Payer: Self-pay | Admitting: Gastroenterology

## 2014-12-10 NOTE — Op Note (Signed)
NAMMorrison Old:  Cashion, Corrinna                ACCOUNT NO.:  [id number]  MEDICAL RECORD NO.:  [id number]  LOCATION:  [facility]                          FACILITY:  [facility]  PHYSICIAN:  Jonette EvaSandi Fields, M.D.     DATE OF BIRTH:  [birth year]  DATE OF PROCEDURE:  12/09/2014 DATE OF DISCHARGE:  12/09/2014                              OPERATIVE REPORT   PROCEDURE:  Flexible sigmoidoscopy with hemorrhoid banding.  REFERRING PROVIDER:  Lia HoppingXaje Hasanaj, MD  INDICATION FOR EXAM:  [name] is a 59 year old female, who presented with rectal pain.  She denied any bleeding.  Her last colonoscopy was 2012.  FINDINGS: 1. Normal sigmoid mucosa.  No polyps or diverticula appreciated. 2. Moderate internal hemorrhoids & external     hemorrhoids.  IMPRESSION:  Rectal pain most likely due to internal and external hemorrhoids.  RECOMMENDATIONS: 1. Call [phone] for rectal bleeding and rectal pain, fever or     problems urinating. 2. Low residue diet for 2 weeks. 3. Drink water to keep urine light yellow. 4. Avoid straining and constipation. 5. Use Colace 1 at 3 times a day to soften stool.  Hold for diarrhea. 6. May use rectal cream as needed for rectal pain or itching. 7. Follow up in 3 weeks.  MEDICATIONS:  Propofol.  PROCEDURE TECHNIQUE:  Physical exam was performed.  Informed consent was obtained.  The patient explained the benefits, risks, and alternatives to.  PROCEDURE:  Physical exam was performed.  Informed consent was obtained from the patient after explaining the benefits, risks, and alternatives to the procedure.  The patient was connected to the monitor and placed in the left lateral position.  Continuous oxygen was provided by nasal cannula and IV medicine administered through an indwelling cannula. After administration of sedation and rectal exam, the patient's rectum was intubated, and the scope was advanced under direct visualization to 40-50 cm beyond the anal verge.  The scope was  removed followed by careful exam of color, texture, anatomy and integrity of the mucosal way out.  The scope was reinserted and retroflexed in the rectum.  Three bands were placed on her internal hemorrhoids.  The patient was recovered in endoscopy and discharged home in satisfactory condition     Jonette EvaSandi Fields, M.D.     SF/MEDQ  D:  12/09/2014  T:  12/10/2014  Job:  [id number]  cc:   Lia HoppingXaje Hasanaj, MD Fax: [phone]

## 2014-12-11 ENCOUNTER — Ambulatory Visit (HOSPITAL_COMMUNITY)
Admission: RE | Admit: 2014-12-11 | Discharge: 2014-12-11 | Disposition: A | Discharge status: Home / Self Care | Payer: 59 | Source: Ambulatory Visit | Attending: Gastroenterology | Admitting: Gastroenterology

## 2014-12-11 DIAGNOSIS — K219 Gastro-esophageal reflux disease without esophagitis: Secondary | ICD-10-CM

## 2014-12-17 ENCOUNTER — Telehealth: Payer: Self-pay | Admitting: General Practice

## 2014-12-17 NOTE — Telephone Encounter (Signed)
Patient called in and wanted to have a nurse call her back she is having a lot of soreness and itching in her rectum.  She is also having some right sided abdominal pain. She states most of her symptoms started after her hemprrhoid banding, however she has had some abdominal discomfort prior to her banding.  She is having some nausea, however she doesn't have a fever.  Routing to [employer]Doris.

## 2014-12-17 NOTE — Telephone Encounter (Signed)
Pt is aware.  

## 2014-12-17 NOTE — Telephone Encounter (Signed)
PLEASE CALL PT. SHE IS HAVING A TYPICAL RECOVERY TIME FOR HAVING HER HEMORRHOIDS BANDING. HER SYMPTOM SHOULD GET BETTER OVER THE NEXT 7 DAYS.

## 2014-12-17 NOTE — Telephone Encounter (Signed)
I called pt. She is still having a lot of rectal discomfort and itching. Having regular BM's everyday. Said she has a hard time trying to pass gas, that it feels like something is blocking the gas. She has pain on her right side, mid way her abdomen, that hurts when she breathes. She does not have a cough.  York SpanielSaid it feels like her urine has difficulty coming out since she had the flex sig with hemorrhoid banding. She is not having any rectal bleeding now. Please advise!

## 2014-12-18 ENCOUNTER — Ambulatory Visit (INDEPENDENT_AMBULATORY_CARE_PROVIDER_SITE_OTHER): Payer: 59 | Admitting: Psychology

## 2014-12-18 DIAGNOSIS — F419 Anxiety disorder, unspecified: Secondary | ICD-10-CM

## 2014-12-18 DIAGNOSIS — F331 Major depressive disorder, recurrent, moderate: Secondary | ICD-10-CM | POA: Diagnosis not present

## 2014-12-23 ENCOUNTER — Ambulatory Visit (HOSPITAL_COMMUNITY)
Admission: RE | Admit: 2014-12-23 | Discharge: 2014-12-23 | Disposition: A | Discharge status: Home / Self Care | Payer: 59 | Source: Ambulatory Visit | Attending: Gastroenterology | Admitting: Gastroenterology

## 2014-12-23 DIAGNOSIS — K219 Gastro-esophageal reflux disease without esophagitis: Secondary | ICD-10-CM | POA: Insufficient documentation

## 2014-12-23 DIAGNOSIS — K449 Diaphragmatic hernia without obstruction or gangrene: Secondary | ICD-10-CM | POA: Diagnosis not present

## 2014-12-25 NOTE — Telephone Encounter (Signed)
PLEASE CALL PT. HER SWALLOWING TEST SHOWS NO STRICTURE. SHE HAS MILD REFLUX. HER REFLUX SURGERY IS STILL IN PLACE. FOLLOW A LOW FAT DIET. AVOID TRIGGERS FOR REFLUX. CONTINUE NEXIUM. TAKE 30 MINUTES BEFORE MEALS.

## 2014-12-26 ENCOUNTER — Telehealth: Payer: Self-pay | Admitting: Gastroenterology

## 2014-12-26 NOTE — Telephone Encounter (Signed)
I called and informed pt. She said she still has concerns and she will discuss at OV next week.

## 2014-12-26 NOTE — Telephone Encounter (Signed)
Pt called to speak with DS about her diet plan. She said it's been changed 3 times and she is getting "pissed off". She said that she has bought a lot of high fiber foods and is concern about that making her constipation, low fat diet she is concerned about the salt/sodium and then she starting fussing about the bands in her banding were latex and its taken her longer to heal from that. I told her that once DS finishes discharging a patient that she would call her back and I would document her concerns. Please call her back ASAP because she has to go out. [phone]

## 2014-12-26 NOTE — Telephone Encounter (Signed)
I called pt and told her that Dr. Darrick PennaFields said for her to follow a low fat diet and avoid foods that cause reflux such as acid goods and spicy foods and caffeine.  I also told her we can give her some handouts when she comes in Tuesday. She said fine.

## 2014-12-30 ENCOUNTER — Other Ambulatory Visit: Payer: Self-pay | Admitting: Gastroenterology

## 2014-12-30 ENCOUNTER — Encounter: Payer: Self-pay | Admitting: Gastroenterology

## 2014-12-30 ENCOUNTER — Ambulatory Visit (INDEPENDENT_AMBULATORY_CARE_PROVIDER_SITE_OTHER): Payer: 59 | Admitting: Gastroenterology

## 2014-12-30 VITALS — BP 106/66 | HR 62 | Temp 97.6°F | Ht 66.0 in | Wt 151.6 lb

## 2014-12-30 DIAGNOSIS — K219 Gastro-esophageal reflux disease without esophagitis: Secondary | ICD-10-CM

## 2014-12-30 DIAGNOSIS — K6289 Other specified diseases of anus and rectum: Secondary | ICD-10-CM

## 2014-12-30 DIAGNOSIS — G8929 Other chronic pain: Secondary | ICD-10-CM | POA: Diagnosis not present

## 2014-12-30 DIAGNOSIS — K625 Hemorrhage of anus and rectum: Secondary | ICD-10-CM

## 2014-12-30 DIAGNOSIS — R1013 Epigastric pain: Secondary | ICD-10-CM

## 2014-12-30 DIAGNOSIS — D649 Anemia, unspecified: Secondary | ICD-10-CM

## 2014-12-30 DIAGNOSIS — K589 Irritable bowel syndrome without diarrhea: Secondary | ICD-10-CM | POA: Diagnosis not present

## 2014-12-30 NOTE — Patient Instructions (Addendum)
1. Please have your labs and stool test done in four weeks. 2. I will discuss premedication options for ct contrast with radiologist. 3. Please call if your rectal pain and urine problems don't resolve over the next couple of weeks.

## 2014-12-30 NOTE — Progress Notes (Signed)
Primary Care Physician: Toma DeitersHASANAJ,XAJE A, MD  Primary Gastroenterologist:  Jonette EvaSandi Fields, MD   Chief Complaint  Patient presents with  . Follow-up    HPI: Yesenia SonsStella L Boyd is a 59 y.o. female here for follow up. She has had a difficult recovery since her sigmoidoscopy with hemorrhoid banding 12/09/14. She had 3 bands placed on her internal hemorrhoids. States she has had a lot of itching and rectal irritation which she attributes to latex bands. Says she will probably never have that procedure again. She developed facial rash and had to take benadryl. Finally passed the last band yesterday. No recent blood in stool. Stopped her stool softener due to red dye 55. Currently taking amitiza 24mcg bid and a swallow of mag citrate twice daily. BM most days. Complains of mid abdominal bloating and pressure. Complains of bloating. Symptoms not better with regular BMs.. Still feels like bladder not emptying well since her hemorrhoid banding. Continues to have problems with food going through her distal esophagus slowly. She doesn't feel like anything more can be done with that. She tries to chew food thoroughly and consume moist food only. Fibers cause a lot of stomach cramp. BPE recently as outlined below.   Chronic mild anemia. Has seen hematologist remotely. Iron caused constipation. No prior iron or blood transfusion. She thinks she has sickle cell trait.   Reports she is allergic to IV dye and prednisone.     Current Outpatient Prescriptions  Medication Sig Dispense Refill  . acetaminophen (TYLENOL) 650 MG CR tablet Take 650 mg by mouth every 8 (eight) hours as needed for pain.     Marland Kitchen. albuterol (PROVENTIL HFA;VENTOLIN HFA) 108 (90 BASE) MCG/ACT inhaler Inhale 2 puffs into the lungs every 4 (four) hours as needed for shortness of breath.    Marland Kitchen. azelastine (ASTELIN) 137 MCG/SPRAY nasal spray Place 1 spray into the nose 2 (two) times daily.     . beclomethasone (QVAR) 80 MCG/ACT inhaler Inhale 2 puffs  into the lungs daily.    Marland Kitchen. CARAFATE 1 GM/10ML suspension Take 1 g by mouth 4 (four) times daily.     . carbamazepine (CARBATROL) 100 MG 12 hr capsule Take 100 mg by mouth at bedtime.     . Cholecalciferol (CVS VIT D 5000 HIGH-POTENCY) 5000 UNITS capsule Take 5,000 Units by mouth every morning.     Marland Kitchen. EPINEPHrine 0.3 mg/0.3 mL IJ SOAJ injection Inject 0.3 mg into the muscle as needed (allergic reaction).    Marland Kitchen. estrogens, conjugated, (PREMARIN) 0.625 MG tablet Take 0.625 mg by mouth every morning. Take daily for 21 days then do not take for 7 days.    . hydrOXYzine (VISTARIL) 25 MG capsule Take 25 mg by mouth at bedtime.     Marland Kitchen. L-Methylfolate-B6-B12 (METANX PO) Take 1 tablet by mouth at bedtime.     Marland Kitchen. levocetirizine (XYZAL) 5 MG tablet Take 5 mg by mouth every morning.     . lubiprostone ([name]) 24 MCG capsule Take 24 mcg by mouth 2 (two) times daily with a meal.      . magnesium citrate solution Take 296 mLs by mouth daily as needed. For blockage    . omeprazole (PRILOSEC) 40 MG capsule Take 40 mg by mouth 2 (two) times daily.     Marland Kitchen. PRESCRIPTION MEDICATION Apply 1 application topically every morning. LCD10% with Salicylic Acid 2%. Use as directed for skin and rashes.    . simethicone (GAS-X) 80 MG chewable tablet Chew 80 mg  by mouth every 6 (six) hours as needed. For gas    . tiZANidine (ZANAFLEX) 4 MG tablet Take 4 mg by mouth at bedtime.     . triamcinolone (KENALOG) 0.1 % cream Apply 1 application topically 2 (two) times daily as needed. For skin.    Marland Kitchen vitamin E 1000 UNIT capsule Take 1,000 Units by mouth daily.     No current facility-administered medications for this visit.    Allergies as of 12/30/2014 - Review Complete 12/30/2014  Allergen Reaction Noted  . Avelox [moxifloxacin hcl in nacl] Anaphylaxis and Itching 06/20/2011  . Linzess [linaclotide] Swelling 02/04/2013  . Adhesive [tape] Other (See Comments) 01/24/2013  . Cephalexin Hives and Itching   . Codeine Hives and Itching   .  Cymbalta [duloxetine hcl] Other (See Comments) 06/20/2011  . Dicyclomine Itching and Other (See Comments) 06/20/2011  . Latex Itching 01/24/2013  . Macrolides and ketolides Swelling 07/18/2011  . Meperidine hcl Itching and Other (See Comments)   . Metronidazole Hives and Other (See Comments) 06/20/2011  . Morphine Itching and Other (See Comments)   . Penicillins Hives   . Prednisone Hives, Swelling, and Other (See Comments) 06/20/2011  . Sulfonamide derivatives Hives   . Tetracycline Other (See Comments)   . Tramadol hcl Hives and Other (See Comments)     ROS:  General: Negative for anorexia, weight loss, fever, chills, fatigue, weakness. ENT: Negative for hoarseness, nasal congestion. CV: Negative for chest pain, angina, palpitations, dyspnea on exertion, peripheral edema.  Respiratory: Negative for dyspnea at rest, dyspnea on exertion, cough, sputum, wheezing.  GI: See history of present illness. GU:  Negative for dysuria, hematuria, urinary incontinence, urinary frequency, nocturnal urination. See hpi Endo: Negative for unusual weight change.    Physical Examination:   BP 106/66 mmHg  Pulse 62  Temp(Src) 97.6 F (36.4 C) (Oral)  Ht  (1.676 m)  Wt 151 lb 9.6 oz (68.765 kg)  BMI 24.48 kg/m2  General: Well-nourished, well-developed in no acute distress.  Eyes: No icterus. Mouth: Oropharyngeal mucosa moist and pink , no lesions erythema or exudate. Lungs: Clear to auscultation bilaterally.  Heart: Regular rate and rhythm, no murmurs rubs or gallops.  Abdomen: Bowel sounds are normal, nontender, nondistended, no hepatosplenomegaly or masses, no abdominal bruits or hernia , no rebound or guarding.   Extremities: No lower extremity edema. No clubbing or deformities. Neuro: Alert and oriented x 4   Skin: Warm and dry, no jaundice.   Psych: Alert and cooperative, normal mood and affect.  Labs:  Lab Results  Component Value Date   WBC 4.0 12/05/2014   HGB 10.9*  12/05/2014   HCT 32.4* 12/05/2014   MCV 79.0 12/05/2014   PLT 213 12/05/2014   Lab Results  Component Value Date   CREATININE 0.72 12/05/2014   BUN 9 12/05/2014   NA 137 12/05/2014   K 4.0 12/05/2014   CL 104 12/05/2014   CO2 28 12/05/2014    Imaging Studies: Dg Esophagus  12/23/2014   CLINICAL DATA:  GERD.  History of hiatal hernia repair  EXAM: ESOPHOGRAM/BARIUM SWALLOW  TECHNIQUE: Single contrast examination was performed using  thin barium.  FLUOROSCOPY TIME:  1 minutes 48 seconds  COMPARISON:  06/09/2009  FINDINGS: Esophageal mucosa and motility are normal. Hiatal hernia repair with clips below the diaphragm. No significant hiatal hernia. No significant stricture. No mass or diverticulum identified.  Barium tablet passed into the stomach without significant delay.  Mild gastroesophageal reflux.  IMPRESSION: Satisfactory hiatal  hernia repair without significant stricture. Mild reflux.   Electronically Signed   By: Marlan Palau M.D.   On: 12/23/2014 11:08

## 2015-01-01 DIAGNOSIS — D649 Anemia, unspecified: Secondary | ICD-10-CM | POA: Insufficient documentation

## 2015-01-01 NOTE — Assessment & Plan Note (Signed)
Chronic anemia. Reports possible sickle cell trait. In four weeks, once she has healed from hemorrhoids banding, we will have her check ifobt, anemia labs.

## 2015-01-01 NOTE — Assessment & Plan Note (Signed)
BMS regular on current bowel regimen. Continues to complain of mid abdominal/upper abdominal pain, bloating. H/O SBBO previously in [place]. Patient hesitant to change medication. She is hesitant to CT abd/pelvis to rule out other etiologies. No prior CTs. She reports being unable to have IV contrast for CT. Reports unable to have prednisone due to hives. Asking for hydrocortisone instead. Will discuss with radiologist.

## 2015-01-01 NOTE — Assessment & Plan Note (Signed)
GERD s/p Nissen fundoplication twice, symptoms managed on carafate, omeprazole BID. Continue antireflux measures. Recent esophagram shows intact hiatal hernia repair without stricture. +mild reflux.

## 2015-01-01 NOTE — Assessment & Plan Note (Signed)
Improving at this time. S/p hemorrhoid banding X 3. Continue current regimen. Call if symptoms don't resolve over the next couple of weeks.

## 2015-01-02 DIAGNOSIS — Z6826 Body mass index (BMI) 26.0-26.9, adult: Secondary | ICD-10-CM | POA: Diagnosis not present

## 2015-01-02 DIAGNOSIS — K210 Gastro-esophageal reflux disease with esophagitis: Secondary | ICD-10-CM | POA: Diagnosis not present

## 2015-01-06 NOTE — Progress Notes (Signed)
cc'ed to pcp °

## 2015-01-08 DIAGNOSIS — Z6826 Body mass index (BMI) 26.0-26.9, adult: Secondary | ICD-10-CM | POA: Diagnosis not present

## 2015-01-08 DIAGNOSIS — J309 Allergic rhinitis, unspecified: Secondary | ICD-10-CM | POA: Diagnosis not present

## 2015-01-09 ENCOUNTER — Other Ambulatory Visit: Payer: Self-pay

## 2015-01-09 DIAGNOSIS — D649 Anemia, unspecified: Secondary | ICD-10-CM

## 2015-01-09 DIAGNOSIS — K625 Hemorrhage of anus and rectum: Secondary | ICD-10-CM

## 2015-01-14 ENCOUNTER — Ambulatory Visit (INDEPENDENT_AMBULATORY_CARE_PROVIDER_SITE_OTHER): Payer: 59 | Admitting: Psychology

## 2015-01-14 DIAGNOSIS — F419 Anxiety disorder, unspecified: Secondary | ICD-10-CM | POA: Diagnosis not present

## 2015-01-14 DIAGNOSIS — F331 Major depressive disorder, recurrent, moderate: Secondary | ICD-10-CM | POA: Diagnosis not present

## 2015-01-20 ENCOUNTER — Telehealth: Payer: Self-pay | Admitting: Gastroenterology

## 2015-01-20 NOTE — Telephone Encounter (Signed)
Patient was supposed to call 2 weeks after last visit and let us know how she was.  Is still having pressure and took an enema and nothing really came out.  Started having bleeding when she wiped.  Last couple of days feels the hemorrhoid inside and has started using suppositories.  Having rectal pain does not know what else to do.  Drinking red wine helped with her weakness.  Please advise [phone].

## 2015-01-21 NOTE — Telephone Encounter (Signed)
I called pt. She said she is feeling some better today. The hemorrhoid is swollen, but she did not see any blood yesterday.   Then she started talking about medication side effects, that some meds were working on her memory.  She especially mentioned Protonix that PCP had given her. I told her if she has questions about the medication that he gives her she should check with him.

## 2015-01-21 NOTE — Telephone Encounter (Signed)
PLEASE CALL PT. IF SHE IS STILL HAVING HEMORRHOID PROBLEMS SHE SHOULD SEE HER SURGEON, DR. MARTIN IN [place] REGARDING HEMORRHOID SURGERY. SHE IS NOT A CANDIDATE FOR ADDITIONAL BANDNG OF THE INTERNAL HEMORRHOIDS. SHE ALSO HAS EXTERNAL HEMORRHOIDS THAT COUL DBE ADRESSED WITH SURGERY.

## 2015-01-22 ENCOUNTER — Other Ambulatory Visit: Payer: Self-pay

## 2015-01-22 DIAGNOSIS — K649 Unspecified hemorrhoids: Secondary | ICD-10-CM

## 2015-01-22 NOTE — Telephone Encounter (Signed)
Pt is aware and OK to make the referral.

## 2015-01-22 NOTE — Telephone Encounter (Signed)
Called pt. She is aware of Appt date and time.  (02/25/2015 @ 3:00pm)

## 2015-01-22 NOTE — Telephone Encounter (Signed)
[facility] (Dr. Daphine DeutscherMartin)

## 2015-01-24 DIAGNOSIS — Z880 Allergy status to penicillin: Secondary | ICD-10-CM | POA: Diagnosis not present

## 2015-01-24 DIAGNOSIS — Z883 Allergy status to other anti-infective agents status: Secondary | ICD-10-CM | POA: Diagnosis not present

## 2015-01-24 DIAGNOSIS — Z79899 Other long term (current) drug therapy: Secondary | ICD-10-CM | POA: Diagnosis not present

## 2015-01-24 DIAGNOSIS — Z8673 Personal history of transient ischemic attack (TIA), and cerebral infarction without residual deficits: Secondary | ICD-10-CM | POA: Diagnosis not present

## 2015-01-24 DIAGNOSIS — Z888 Allergy status to other drugs, medicaments and biological substances status: Secondary | ICD-10-CM | POA: Diagnosis not present

## 2015-01-24 DIAGNOSIS — R0989 Other specified symptoms and signs involving the circulatory and respiratory systems: Secondary | ICD-10-CM | POA: Diagnosis not present

## 2015-01-24 DIAGNOSIS — R1310 Dysphagia, unspecified: Secondary | ICD-10-CM | POA: Diagnosis not present

## 2015-01-26 ENCOUNTER — Telehealth: Payer: Self-pay

## 2015-01-26 NOTE — Telephone Encounter (Signed)
Pt states she is still having trouble swallowing and isn't sure if she needs to have an office visit to see what would be next. Also wants to know does she still  need to have labs and stool test done.

## 2015-01-27 ENCOUNTER — Telehealth: Payer: Self-pay

## 2015-01-27 NOTE — Telephone Encounter (Signed)
Called pt and spoke with her about her IFOBT that she dropped off. The sample was inadequate and will need to be recollected per Laban Emperor, NP.   Pt is on her way back to the office to pick up another sample kit.

## 2015-01-28 DIAGNOSIS — K210 Gastro-esophageal reflux disease with esophagitis: Secondary | ICD-10-CM | POA: Diagnosis not present

## 2015-01-28 DIAGNOSIS — Z6826 Body mass index (BMI) 26.0-26.9, adult: Secondary | ICD-10-CM | POA: Diagnosis not present

## 2015-01-28 LAB — CBC WITH DIFFERENTIAL/PLATELET
Basophils Absolute: 0.1 10*3/uL (ref 0.0–0.1)
Basophils Relative: 1 % (ref 0–1)
Eosinophils Absolute: 0.1 10*3/uL (ref 0.0–0.7)
Eosinophils Relative: 1 % (ref 0–5)
HCT: 32.6 % — ABNORMAL LOW (ref 36.0–46.0)
Hemoglobin: 11 g/dL — ABNORMAL LOW (ref 12.0–15.0)
Lymphocytes Relative: 49 % — ABNORMAL HIGH (ref 12–46)
Lymphs Abs: 2.5 10*3/uL (ref 0.7–4.0)
MCH: 27 pg (ref 26.0–34.0)
MCHC: 33.7 g/dL (ref 30.0–36.0)
MCV: 79.9 fL (ref 78.0–100.0)
MPV: 9.8 fL (ref 8.6–12.4)
Monocytes Absolute: 0.4 10*3/uL (ref 0.1–1.0)
Monocytes Relative: 7 % (ref 3–12)
Neutro Abs: 2.1 10*3/uL (ref 1.7–7.7)
Neutrophils Relative %: 42 % — ABNORMAL LOW (ref 43–77)
Platelets: 236 10*3/uL (ref 150–400)
RBC: 4.08 MIL/uL (ref 3.87–5.11)
RDW: 13.7 % (ref 11.5–15.5)
WBC: 5.1 10*3/uL (ref 4.0–10.5)

## 2015-01-28 LAB — IRON AND TIBC
%SAT: 34 % (ref 20–55)
Iron: 98 ug/dL (ref 42–145)
TIBC: 286 ug/dL (ref 250–470)
UIBC: 188 ug/dL (ref 125–400)

## 2015-01-28 LAB — FERRITIN: Ferritin: 88 ng/mL (ref 10–291)

## 2015-02-10 NOTE — Progress Notes (Signed)
Quick Note:  LMOM to call. ______ 

## 2015-02-10 NOTE — Progress Notes (Signed)
Quick Note:  Hgb low but stable for several years. No IDA. Awaiting ifobt, patient had to recollect.  Patient is due for OV with SLF only 03/2015, please schedule (consider E30). ______

## 2015-02-12 NOTE — Progress Notes (Signed)
Quick Note:  Pt is aware. ______ 

## 2015-02-17 NOTE — Telephone Encounter (Signed)
LMOM to call.

## 2015-02-17 NOTE — Telephone Encounter (Signed)
What is the status of her repeat ifobt? Let's go ahead and make her E30 OV with SLF, multiple GI issues (swallowing problems, anemia)

## 2015-02-17 NOTE — Telephone Encounter (Signed)
Pt called office back and she states she is not interested in doing any further testing.  States that we know her stool has blood in it and that we don't need to keep doing test when we already know the facts.  States we are wasting time by doing things when we already know the issues.    Pt agreed to an OV with SLF. She is set up on 03/16/2015 @ 10:00am

## 2015-02-19 ENCOUNTER — Ambulatory Visit (HOSPITAL_COMMUNITY): Payer: Self-pay | Admitting: Psychology

## 2015-02-19 ENCOUNTER — Ambulatory Visit (INDEPENDENT_AMBULATORY_CARE_PROVIDER_SITE_OTHER): Payer: 59 | Admitting: Psychology

## 2015-02-19 DIAGNOSIS — F419 Anxiety disorder, unspecified: Secondary | ICD-10-CM | POA: Diagnosis not present

## 2015-02-19 DIAGNOSIS — F331 Major depressive disorder, recurrent, moderate: Secondary | ICD-10-CM

## 2015-02-23 NOTE — Telephone Encounter (Signed)
See other telephone notes.  

## 2015-02-25 ENCOUNTER — Telehealth: Payer: Self-pay

## 2015-02-25 DIAGNOSIS — K594 Anal spasm: Secondary | ICD-10-CM | POA: Diagnosis not present

## 2015-02-25 NOTE — Telephone Encounter (Signed)
Telephone call from the nurse, Amy, at Dr. Bartholomew CrewsHasanaj's office. She said the report said No H Pylori detected on the 11/06/2014 specimen.

## 2015-02-25 NOTE — Telephone Encounter (Signed)
Per Tana CoastLeslie Lewis, PA, I called Dr. Bartholomew CrewsHasanaj's office to ask for a verbal result on the pt's H Pylori stool antigen collected on 11/06/2014. They have faxed a couple of copies and it is not readable. [facility]  She said the nurse was not in at the moment. She said Dr. Olena LeatherwoodHasanaj just signed of with that's good. Assuming that meant it was negative, but she will have the nurse to call me.

## 2015-03-13 NOTE — Progress Notes (Signed)
Please put orders in Epic surgery 03-27-15 pre op 03-20-15  Thanks

## 2015-03-16 ENCOUNTER — Ambulatory Visit (INDEPENDENT_AMBULATORY_CARE_PROVIDER_SITE_OTHER): Payer: 59 | Admitting: Gastroenterology

## 2015-03-16 ENCOUNTER — Encounter: Payer: Self-pay | Admitting: Gastroenterology

## 2015-03-16 ENCOUNTER — Ambulatory Visit (INDEPENDENT_AMBULATORY_CARE_PROVIDER_SITE_OTHER): Payer: 59 | Admitting: Psychology

## 2015-03-16 VITALS — BP 106/65 | HR 70 | Temp 97.0°F | Ht 63.0 in | Wt 148.8 lb

## 2015-03-16 DIAGNOSIS — K589 Irritable bowel syndrome without diarrhea: Secondary | ICD-10-CM

## 2015-03-16 DIAGNOSIS — K6289 Other specified diseases of anus and rectum: Secondary | ICD-10-CM

## 2015-03-16 DIAGNOSIS — F419 Anxiety disorder, unspecified: Secondary | ICD-10-CM | POA: Diagnosis not present

## 2015-03-16 DIAGNOSIS — K625 Hemorrhage of anus and rectum: Secondary | ICD-10-CM | POA: Diagnosis not present

## 2015-03-16 DIAGNOSIS — F331 Major depressive disorder, recurrent, moderate: Secondary | ICD-10-CM

## 2015-03-16 DIAGNOSIS — K219 Gastro-esophageal reflux disease without esophagitis: Secondary | ICD-10-CM

## 2015-03-16 NOTE — Assessment & Plan Note (Signed)
SYMPTOMS FAIRLY WELL CONTROLLED ON [name]/CITRATE.  CONTINUE TO MONITOR SYMPTOMS. FOLLOW UP IN 6 MOS.

## 2015-03-16 NOTE — Assessment & Plan Note (Signed)
DUE TO HEMORRHOIDS, SYMPTOMS CONTROLLED/RESOLVED AFTER HEMORRHOID BANDING.  CONTINUE TO MONITOR SYMPTOMS. FOLLOW UP IN 6 MOS.

## 2015-03-16 NOTE — Assessment & Plan Note (Signed)
UNDERGOING WORKUP FOR ANAL STRICTURE.  FOLLOW WITH SURGEON. FOLLOW UP IN 6 MOS.

## 2015-03-16 NOTE — Progress Notes (Signed)
Subjective:    Patient ID: Yesenia Boyd, female    DOB: 04/18/1956, [age] y.o.   MRN: [id number]  Lia HoppingHASANAJ,XAJE A, MD  HPI SEEING DR. MARTIN-PENDING [facility]CS(MAY 20). SWALLOWING: OK-LIVING WITH IT. CHANGED DIET. STAYS SICK ON STOMACH. COULDN'T TAKE PROTONIX(SWELLING). COULDN'T SWALLOW OMEPRAZOLE. 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 NOW ON RANITIDINE.  DID GOOD WITH NEXIUM. INSURANCE WOULD NOT PAY FOR IT. MAY THROW UP: EVERY WEEK(NO BLOOD). FEELS LIKE FOOD GETS HUNG UP AND SHE HAS TO STICK HER FINGER IN HER THROAT. BMs: FEELS LIKE [name] HELPS SOME WITH CONSTIPATION. GOOD BM THIS AM. FEELS COLD BEFORE SHE HAS A BM. HAS DARKS STOOLS. CAN HAVE LOWER AND UPPER ABDOMINAL PAUN DAIY.  PT DENIES FEVER, CHILLS, HEMATOCHEZIA, HEMATEMESIS, melena, diarrhea, CHEST PAIN, SHORTNESS OF BREATH, OR CHANGE IN BOWEL IN HABITS.    Past Medical History  Diagnosis Date  . IBS (irritable bowel syndrome)   . S/P endoscopy 2008, Aug 2010, Sept 2012    Dr. Linna DarnerAnwar 2008: removal of impacted food bolus, Dr. Linna DarnerAnwar 2010: moderate gastritis, Sept 2012 with SLF: path with mild gastritis, no definite stricture noted, dilation with Savary 16 mm  . S/P colonoscopy April 2009, Sept 2012    normal Dr. Linna DarnerAnwar, internal hemorrhoids, repeat in Sept 2022  . Numbness and tingling in right hand     and whole left side of body-pt sts she has "spells to where she cannot walk or talk"  . Sleep apnea     no cpap used, was told to get oxygen  to use  . Bile reflux gastritis   . TIA (transient ischemic attack) 2012  . Asthma   . Peripheral neuropathy   . Myasthenia gravis     right sided weakness occ.  . Fibromyalgia     [facility]  . Myasthenia gravis in remission   . Neuropathy   . GERD (gastroesophageal reflux disease)    Past Surgical History  Procedure Laterality Date  . Nissen fundoplication      twice  . Cholecystectomy    . Abdominal hysterectomy      complete per patient  . Esophageal surgery?      2008 [facility]  . Hand surgery       carpal tumnnel right and removal of cyst left  . Neck surgery      removal of "knot"t from neck  . Bladder surgery      stretch bladder opening  . Savory dilation  07/18/2011    Surgeon:Sandi M Fields  . Esophageal manometry N/A 01/21/2013    Procedure: ESOPHAGEAL MANOMETRY (EM);  Surgeon: Valarie MerinoMatthew B Martin, MD;  Location: [facility];  Service: General;  Laterality: N/A;  . Laparoscopic nissen fundoplication N/A 02/13/2013    Procedure: Redo LAPAROSCOPIC NISSEN FUNDOPLICATION;  Surgeon: Valarie MerinoMatthew B Martin, MD;  Location: [facility];  Service: General;  Laterality: N/A;  Forgut explortation with partial  takedown nissan funliplication from 1994 for wrap torsion and persistant dysphagia  . Flexible sigmoidoscopy N/A 12/09/2014    SLF: Rectal pain most likely due to internal and external hemorroids  . Hemorrhoid banding N/A 12/09/2014    Procedure: HEMORRHOID BANDING;  Surgeon: West BaliSandi L Fields, MD;  Location: [facility];  Service: Endoscopy;  Laterality: N/A;  . Colonoscopy  07/18/2011    SLF: 1. internal hemorrhoids  . Esophagogastroduodenoscopy  07/18/2011    SLF: 1. Moderate gastritis 2. Dysphagia most likely 2o large food bolus moving through her Nissen, which appears to be intact. Pt has  poor denttition and NL BPE 2 years ago. 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    Allergies  Allergen Reactions  . Avelox [Moxifloxacin Hcl In Nacl] Anaphylaxis and Itching  . Linzess [Linaclotide] Swelling    Swelling, bloating, nausea.Med used for "IBS"  . Adhesive [Tape] Other (See Comments)    Red and itchy under tape  . Carbamazepine Other (See Comments)    Memory loss   . Cephalexin Hives and Itching  . Codeine Hives and Itching  . Cymbalta [Duloxetine Hcl] Other (See Comments)    Hair loss, nervousness, severe constipation, red blotches  . Dicyclomine Itching and Other (See Comments)    Nervousness  . Latex Itching  . Macrolides And Ketolides Swelling  .  Meperidine Hcl Itching and Other (See Comments)    Nervousness  . Metronidazole Hives and Other (See Comments)    Red blotches.  . Morphine Itching and Other (See Comments)    Nervousness  . Penicillins Hives  . Pepcid [Famotidine] Nausea And Vomiting  . Prednisone Hives, Swelling and Other (See Comments)    Red blotches - Can take with Benadryl  . Sulfonamide Derivatives Hives  . Tetracycline Other (See Comments)    Reaction unknown  . Tramadol Hcl Hives and Other (See Comments)    Nervousness    Current Outpatient Prescriptions  Medication Sig Dispense Refill  . acetaminophen (TYLENOL) 650 MG CR tablet Take 650 mg by mouth every 8 (eight) hours as needed for pain.     Marland Kitchen. albuterol (PROVENTIL HFA;VENTOLIN HFA) 108 (90 BASE) MCG/ACT inhaler Inhale 2 puffs into the lungs every 4 (four) hours as needed for shortness of breath.    Marland Kitchen. azelastine (ASTELIN) 137 MCG/SPRAY nasal spray Place 1 spray into the nose 2 (two) times daily.     . beclomethasone (QVAR) 80 MCG/ACT inhaler Inhale 2 puffs into the lungs daily.    Marland Kitchen. CARAFATE 1 GM/10ML suspension Take 1 g by mouth 4 (four) times daily.     . carbamazepine (CARBATROL) 100 MG 12 hr capsule Take 100 mg by mouth at bedtime.     . Cholecalciferol ([employer] VIT D 5000 HIGH-POTENCY) 5000 UNITS capsule Take 5,000 Units by mouth every morning.     Marland Kitchen. EPINEPHrine 0.3 mg/0.3 mL IJ SOAJ injection Inject 0.3 mg into the muscle as needed (allergic reaction).    Marland Kitchen. estrogens, conjugated, (PREMARIN) 0.625 MG tablet Take 0.625 mg by mouth every morning. Take daily for 21 days then do not take for 7 days.    . hydrOXYzine (VISTARIL) 25 MG capsule Take 25 mg by mouth at bedtime.     Marland Kitchen. L-Methylfolate-B6-B12 (METANX PO) Take 1 tablet by mouth at bedtime.     Marland Kitchen. levocetirizine (XYZAL) 5 MG tablet Take 5 mg by mouth every morning.     . lubiprostone ([name]) 24 MCG capsule Take 24 mcg by mouth 2 (two) times daily with a meal.   QHS(2)   . magnesium citrate solution Take  296 mLs by mouth daily as needed. For blockage    . PRESCRIPTION MEDICATION Apply 1 application topically every morning. LCD10% with Salicylic Acid 2%. Use as directed for skin and rashes.    . simethicone (GAS-X) 80 MG chewable tablet Chew 80 mg by mouth every 6 (six) hours as needed. For gas    . tiZANidine (ZANAFLEX) 4 MG tablet Take 4 mg by mouth at bedtime.     . triamcinolone (KENALOG) 0.1 % cream Apply 1  application topically 2 (two) times daily as needed. For skin.    Marland Kitchen vitamin E 1000 UNIT capsule Take 1,000 Units by mouth daily.     RANITIDINE 300 MG AT BEDTIME     CITRATE QHS WHEN TROUBLE WITH CONSTIPATION      Review of Systems     Objective:   Physical Exam  Constitutional: She is oriented to person, place, and time. She appears well-developed and well-nourished. No distress.  HENT:  Head: Normocephalic and atraumatic.  Mouth/Throat: Oropharynx is clear and moist. No oropharyngeal exudate.  Eyes: Pupils are equal, round, and reactive to light. No scleral icterus.  Neck: Normal range of motion. Neck supple.  Cardiovascular: Normal rate, regular rhythm and normal heart sounds.   Pulmonary/Chest: Effort normal and breath sounds normal. No respiratory distress.  Abdominal: Soft. Bowel sounds are normal. She exhibits no distension. There is tenderness. There is no rebound and no guarding.  MILD TTP x4  Musculoskeletal: She exhibits no edema.  Lymphadenopathy:    She has no cervical adenopathy.  Neurological: She is alert and oriented to person, place, and time.  NO  NEW FOCAL DEFICITS   Psychiatric: She has a normal mood and affect.  Vitals reviewed.         Assessment & Plan:

## 2015-03-16 NOTE — Progress Notes (Signed)
REVIEWED-NO ADDITIONAL RECOMMENDATIONS. 

## 2015-03-16 NOTE — Progress Notes (Signed)
ON RECALL LIST  °

## 2015-03-16 NOTE — Patient Instructions (Signed)
USE A WEDGE AS NEEDED TO PREVENT REGURGITATION.  CONTINUE A LOW FAT DIET.  MEATS SHOULD BE CHOPPED OR GROUND ONLY.  TRY TO EAT 4 TO 6 SMALL MEALS DAILY.   STAY UPRIGHT 30 MINUTES AFTER EATING. WAIT 3 TO 4 HOURS BEFORE LAYING DOWN AFTER EATING.   CONTINUE [name].  FOLLOW UP IN 6 MOS.

## 2015-03-16 NOTE — Assessment & Plan Note (Signed)
SYMPTOMS FAIRLY WELL CONTROLLED.  CONTINUE TO MONITOR SYMPTOMS. LOW FAT DIET FOLLOW UP IN 6 MOS.

## 2015-03-17 NOTE — Progress Notes (Signed)
Dr Daphine DeutscherMartin-- Need PRE OP ORDERS please--has PST appt,03/20/15  Thanks

## 2015-03-20 ENCOUNTER — Encounter (HOSPITAL_COMMUNITY)
Admission: RE | Admit: 2015-03-20 | Discharge: 2015-03-20 | Disposition: A | Discharge status: Home / Self Care | Payer: 59 | Source: Ambulatory Visit | Attending: Surgery | Admitting: Surgery

## 2015-03-20 ENCOUNTER — Encounter (HOSPITAL_COMMUNITY): Payer: Self-pay | Admitting: Psychology

## 2015-03-20 ENCOUNTER — Encounter (HOSPITAL_COMMUNITY): Payer: Self-pay

## 2015-03-20 DIAGNOSIS — Z01818 Encounter for other preprocedural examination: Secondary | ICD-10-CM | POA: Diagnosis not present

## 2015-03-20 HISTORY — DX: Depression, unspecified: F32.A

## 2015-03-20 HISTORY — DX: Reserved for inherently not codable concepts without codable children: IMO0001

## 2015-03-20 HISTORY — DX: Cerebral infarction, unspecified: I63.9

## 2015-03-20 HISTORY — DX: Major depressive disorder, single episode, unspecified: F32.9

## 2015-03-20 LAB — CBC
HCT: 47.7 % — ABNORMAL HIGH (ref 36.0–46.0)
Hemoglobin: 16.5 g/dL — ABNORMAL HIGH (ref 12.0–15.0)
MCH: 31.7 pg (ref 26.0–34.0)
MCHC: 34.6 g/dL (ref 30.0–36.0)
MCV: 91.6 fL (ref 78.0–100.0)
Platelets: 186 10*3/uL (ref 150–400)
RBC: 5.21 MIL/uL — ABNORMAL HIGH (ref 3.87–5.11)
RDW: 12.7 % (ref 11.5–15.5)
WBC: 8.4 10*3/uL (ref 4.0–10.5)

## 2015-03-20 NOTE — Pre-Procedure Instructions (Signed)
07-08-2014 EKG, epic

## 2015-03-20 NOTE — Progress Notes (Signed)
      PROGRESS NOTE  Patient:  Yesenia Boyd   DOB: 08/27/1956  MR Number: [id number]  [facility] [address] [place] [id number] Dept: [phone]  Start: 4 PM  End: 5 PM  Provider/Observer:     Hershal CoriaJohn R Zayvien Canning PSYD  Chief Complaint:      Chief Complaint  Patient presents with  . Anxiety  . Depression  . Stress    Reason For Service:     The patient initially presented with issues of depression, anxiety, and adjustment difficulties. She has had a lot of traumatic and stressful events in her life that she has difficulty coping with. The patient has numerous medical issues that can be found in her medical chart. Many these have some components to difficulty with sympathetic arousal and anxiety issues.  Interventions Strategy:  Cognitive/behavioral psychotherapeutic interventions  Participation Level:   Active  Participation Quality:  Appropriate      Behavioral Observation:  Well Groomed, Alert, and Appropriate.   Current Psychosocial Factors: The patient reports that she has continued to have significant medical issues and continuing to have referrals for various medical studies. She is very frustrated by this therapist continue to try to figure out what is going on with her particular issues with gastrointestinal issues..  Content of Session:   Reviewed current symptoms and continued work on therapeutic interventions for depression and anxiety.  Current Status:   the patient reports that her anxiety and depression significantly improved a she has been drawn better boundaries for people like her son, her husband, and her daughter..  Patient Progress:   Very good  Target Goals:   Target goals include reducing the intensity, duration, and frequency of significant episodes of depression and anxiety.  Last Reviewed:   03/16/2015  Goals Addressed Today:    Today we worked  on Producer, television/film/videobuilding coping skills and strategies are in issues of her depression and anxiety.  Impression/Diagnosis:   The patient is a long-standing history of recurrent depression and anxiety. Fibromyalgia and irritable bowel syndrome are also present. 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  Diagnosis:    Axis I:  Major depressive disorder, recurrent episode, moderate  Anxiety      Axis II: No diagnosis     Vianney Kopecky R, PsyD 03/20/2015

## 2015-03-20 NOTE — Patient Instructions (Signed)
Yesenia SonsStella L Gossman  03/20/2015   Your procedure is scheduled on: Friday 03/27/2015  [facility] and follow signs to               Short Stay Center at 11:00AM.  Call this number if you have problems the morning of surgery [phone]   Remember: ONLY 1 PERSON MAY GO WITH YOU TO SHORT STAY TO GET  READY MORNING OF YOUR SURGERY.  Do not eat food or drink liquids :After Midnight.    Take these medicines the morning of surgery with A SIP OF WATER: inhalers, nasal sprays and eye drops                               You may not have any metal on your body including hair pins and              piercings  Do not wear jewelry, make-up, lotions, powders or perfumes.             Do not wear nail polish.  Do not shave  48 hours prior to surgery.              Men may shave face and neck.   Do not bring valuables to the hospital. [facility] IS NOT             RESPONSIBLE   FOR VALUABLES.  Contacts, dentures or bridgework may not be worn into surgery.  Leave suitcase in the car. After surgery it may be brought to your room.     Patients discharged the day of surgery will not be allowed to drive home.  Name and phone number of your driver:  Special Instructions: N/A              Please read over the following fact sheets you were given: _____________________________________________________________________             [facility] - Preparing [facility] Before surgery, you can play an important role.  Because skin is not sterile, your skin needs to be as free of germs as possible.  You can reduce the number of germs on your skin by washing with CHG (chlorahexidine gluconate) soap before surgery.  CHG is an antiseptic cleaner which kills germs and bonds with the skin to continue killing germs even after washing. Please DO NOT use if you have an allergy to CHG or antibacterial soaps.  If your skin becomes reddened/irritated stop using the CHG and inform  your nurse when you arrive at Short Stay. Do not shave (including legs and underarms) for at least 48 hours prior to the first CHG shower.  You may shave your face/neck. Please follow these instructions carefully:  1.  Shower with CHG Soap the night before surgery and the  morning of Surgery.  2.  If you choose to wash your hair, wash your hair first as usual with your  normal  shampoo.  3.  After you shampoo, rinse your hair and body thoroughly to remove the  shampoo.                           4.  Use CHG as you would any other liquid soap.  You can apply chg directly  to the skin and wash  Gently with a scrungie or clean washcloth.  5.  Apply the CHG Soap to your body ONLY FROM THE NECK DOWN.   Do not use on face/ open                           Wound or open sores. Avoid contact with eyes, ears mouth and genitals (private parts).                       Wash face,  Genitals (private parts) with your normal soap.             6.  Wash thoroughly, paying special attention to the area where your surgery  will be performed.  7.  Thoroughly rinse your body with warm water from the neck down.  8.  DO NOT shower/wash with your normal soap after using and rinsing off  the CHG Soap.                9.  Pat yourself dry with a clean towel.            10.  Wear clean pajamas.            11.  Place clean sheets on your bed the night of your first shower and do not  sleep with pets. Day of Surgery : Do not apply any lotions/deodorants the morning of surgery.  Please wear clean clothes to the hospital/surgery center.  FAILURE TO FOLLOW THESE INSTRUCTIONS MAY RESULT IN THE CANCELLATION OF YOUR SURGERY PATIENT SIGNATURE_________________________________  NURSE SIGNATURE__________________________________  ________________________________________________________________________

## 2015-03-23 NOTE — Telephone Encounter (Signed)
Thanks

## 2015-03-26 ENCOUNTER — Ambulatory Visit: Payer: Self-pay | Admitting: Surgery

## 2015-03-26 NOTE — Progress Notes (Signed)
Spoke with Crystal at [facility] regarding orders. She stated Dr. Martin has been notified.  

## 2015-03-26 NOTE — H&P (Signed)
Yesenia Boyd 02/25/2015 3:13 PM Location: [facility] Patient #: [id number] DOB: 09/17/1956 Married / Language: English / Race: Black or African American Female  History of Present Illness (Kathleene Bergemann B. Shaka Zech MD; 02/25/2015 3:53 PM) Patient words: hemorrhoids  This is really about rectal pain and hygiene. She was seen by Dr. Fields, GI in [place], who is attempted banding on her with health success. In examining her today she has an external introitus leading up to her external sphincter. This poses difficulty with examination as well as with her own hygiene and wiping. On examination I was able to get my index finger and but this was painful for her and she is tender posteriorly as if she may have a sphincter tear or fissure. I propose we performed a exam under anesthesia with possible sphincterotomy.  I had performed a takedown of her Nissen couple years ago for dysphagia. This is corrected the problem mostly but she still has some intermittent issues with aspiration. She's also had some difficulty with different types of anesthesia including propofol.  We will schedule her for an exam under anesthesia with possible lateral internal sphincterotomy and removal of hemorrhoids as necessary.  The patient is a 58 year old female    Other Problems (Ashley Beck, CMA; 02/25/2015 3:14 PM) Asthma Back Pain Cerebrovascular Accident Chest pain Gastroesophageal Reflux Disease General anesthesia - complications Hemorrhoids Inguinal Hernia Migraine Headache Oophorectomy Bilateral. Seizure Disorder Sleep Apnea  Past Surgical History (Ashley Beck, CMA; 02/25/2015 3:14 PM) Foot Surgery Bilateral. Gallbladder Surgery - Laparoscopic Laparoscopic Inguinal Hernia Surgery Right. Nissen Fundoplication Oral Surgery Sentinel Lymph Node Biopsy  Diagnostic Studies History (Ashley Beck, CMA; 02/25/2015 3:14 PM) Colonoscopy within last year Pap Smear  never  Allergies (Ashley Beck, CMA; 02/25/2015 3:16 PM) Avelox *FLUOROQUINOLONES* Linzess *GASTROINTESTINAL AGENTS - MISC.* Swelling. Cephalexin *CEPHALOSPORINS* Cymbalta *ANTIDEPRESSANTS* MetroNIDAZOLE *CHEMICALS*  Medication History (Ashley Beck, CMA; 02/25/2015 3:16 PM) [name] (24MCG Capsule, Oral) Active. Azelastine HCl (0.1% Solution, Nasal) Active. Carafate (1GM/10ML Suspension, Oral) Active. HydrOXYzine HCl (25MG Tablet, Oral) Active. Levocetirizine Dihydrochloride (5MG Tablet, Oral) Active. Nasonex (50MCG/ACT Suspension, Nasal) Active. Omeprazole (40MG Capsule DR, Oral) Active. Pantoprazole Sodium (40MG Tablet DR, Oral) Active. Premarin (0.625MG Tablet, Oral) Active. TiZANidine HCl (4MG Tablet, Oral) Active. Triamcinolone Acetonide (0.1% Lotion, External) Active. Medications Reconciled  Social History (Ashley Beck, CMA; 02/25/2015 3:14 PM) Alcohol use Occasional alcohol use. Caffeine use Carbonated beverages, Coffee, Tea. Tobacco use Never smoker.  Family History (Ashley Beck, CMA; 02/25/2015 3:14 PM) Alcohol Abuse Brother, Father. Arthritis Father, Mother, Sister, Son. Breast Cancer Family Members In General, Sister. Cancer Family Members In General, Father. Colon Cancer Family Members In General. Colon Polyps Daughter. Depression Son. Diabetes Mellitus Brother, Family Members In General, Mother, Sister. Hypertension Brother, Family Members In General, Father, Mother, Sister. Ischemic Bowel Disease Brother, Daughter, Family Members In General, Mother, Sister. Kidney Disease Mother. Migraine Headache Sister, Son. Rectal Cancer Family Members In General. Respiratory Condition Brother, Sister. Seizure disorder Family Members In General, Son. Thyroid problems Brother, Family Members In General, Father.  Pregnancy / Birth History (Ashley Beck, CMA; 02/25/2015 3:14 PM) Age at menarche 13 years. Gravida 2 Maternal age 21-25 Para  2  Review of Systems (Ashley Beck CMA; 02/25/2015 3:14 PM) General Present- Chills and Weight Gain. Not Present- Appetite Loss, Fatigue, Fever, Night Sweats and Weight Loss. Skin Present- Dryness, Non-Healing Wounds and Rash. Not Present- Change in Wart/Mole, Hives, Jaundice, New Lesions and Ulcer. HEENT Present- Earache, Hoarseness, Seasonal Allergies, Sinus Pain and Visual Disturbances. Not Present-   Hearing Loss, Nose Bleed, Oral Ulcers, Ringing in the Ears, Sore Throat, Wears glasses/contact lenses and Yellow Eyes. Respiratory Present- Difficulty Breathing. Not Present- Bloody sputum, Chronic Cough, Snoring and Wheezing. Cardiovascular Present- Leg Cramps, Shortness of Breath and Swelling of Extremities. Not Present- Chest Pain, Difficulty Breathing Lying Down, Palpitations and Rapid Heart Rate. Gastrointestinal Present- Abdominal Pain, Bloating, Bloody Stool, Constipation, Gets full quickly at meals, Hemorrhoids and Rectal Pain. Not Present- Change in Bowel Habits, Chronic diarrhea, Difficulty Swallowing, Excessive gas, Indigestion, Nausea and Vomiting. Female Genitourinary Not Present- Frequency, Nocturia, Painful Urination, Pelvic Pain and Urgency. Neurological Present- Headaches and Weakness. Not Present- Decreased Memory, Fainting, Numbness, Seizures, Tingling, Tremor and Trouble walking. Psychiatric Present- Change in Sleep Pattern. Not Present- Anxiety, Bipolar, Depression, Fearful and Frequent crying. Endocrine Present- Cold Intolerance and Hair Changes. Not Present- Excessive Hunger, Heat Intolerance, Hot flashes and New Diabetes. Hematology Present- Easy Bruising. Not Present- Excessive bleeding, Gland problems, HIV and Persistent Infections.   Vitals Fay Records(Ashley Beck CMA; 02/25/2015 3:17 PM) 02/25/2015 3:16 PM Weight: 152 lb Height: 63in Body Surface Area: 1.75 m Body Mass Index: 26.93 kg/m Temp.: 97.22F(Oral)  Pulse: 77 (Regular)  Resp.: 18 (Unlabored)  BP: 128/72  (Sitting, Left Arm, Standard)    Physical Exam (Kursten Kruk B. Daphine DeutscherMartin MD; 02/25/2015 3:54 PM) The physical exam findings are as follows: Note:HEENT negative NECK supple without masses CHEST clear HEART SR without murmurs or gallops ABDOMEN nontender Rectal--there appears to be an external introitus lined by skin that leads into the anus. Tender to exam. Will need EUA and sphincterotomy. I explained this to her and her daughter.    Assessment & Plan Molli Hazard(Tymel Conely B. 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

## 2015-03-27 ENCOUNTER — Encounter (HOSPITAL_COMMUNITY)
Admission: RE | Disposition: A | Discharge status: Home / Self Care | Payer: Self-pay | Source: Ambulatory Visit | Attending: Surgery

## 2015-03-27 ENCOUNTER — Ambulatory Visit (HOSPITAL_COMMUNITY): Payer: 59 | Admitting: Anesthesiology

## 2015-03-27 ENCOUNTER — Ambulatory Visit (HOSPITAL_COMMUNITY)
Admission: RE | Admit: 2015-03-27 | Discharge: 2015-03-27 | Disposition: A | Discharge status: Home / Self Care | Payer: 59 | Source: Ambulatory Visit | Attending: Surgery | Admitting: Surgery

## 2015-03-27 ENCOUNTER — Encounter (HOSPITAL_COMMUNITY): Payer: Self-pay | Admitting: *Deleted

## 2015-03-27 DIAGNOSIS — Z888 Allergy status to other drugs, medicaments and biological substances status: Secondary | ICD-10-CM | POA: Insufficient documentation

## 2015-03-27 DIAGNOSIS — K219 Gastro-esophageal reflux disease without esophagitis: Secondary | ICD-10-CM | POA: Insufficient documentation

## 2015-03-27 DIAGNOSIS — Z881 Allergy status to other antibiotic agents status: Secondary | ICD-10-CM | POA: Diagnosis not present

## 2015-03-27 DIAGNOSIS — G43909 Migraine, unspecified, not intractable, without status migrainosus: Secondary | ICD-10-CM | POA: Diagnosis not present

## 2015-03-27 DIAGNOSIS — G40909 Epilepsy, unspecified, not intractable, without status epilepticus: Secondary | ICD-10-CM | POA: Diagnosis not present

## 2015-03-27 DIAGNOSIS — K594 Anal spasm: Secondary | ICD-10-CM | POA: Diagnosis not present

## 2015-03-27 DIAGNOSIS — K589 Irritable bowel syndrome without diarrhea: Secondary | ICD-10-CM | POA: Diagnosis not present

## 2015-03-27 DIAGNOSIS — Z8673 Personal history of transient ischemic attack (TIA), and cerebral infarction without residual deficits: Secondary | ICD-10-CM | POA: Diagnosis not present

## 2015-03-27 DIAGNOSIS — Z79899 Other long term (current) drug therapy: Secondary | ICD-10-CM | POA: Diagnosis not present

## 2015-03-27 DIAGNOSIS — F329 Major depressive disorder, single episode, unspecified: Secondary | ICD-10-CM | POA: Diagnosis not present

## 2015-03-27 DIAGNOSIS — Z880 Allergy status to penicillin: Secondary | ICD-10-CM | POA: Insufficient documentation

## 2015-03-27 DIAGNOSIS — G4730 Sleep apnea, unspecified: Secondary | ICD-10-CM | POA: Diagnosis not present

## 2015-03-27 DIAGNOSIS — J45909 Unspecified asthma, uncomplicated: Secondary | ICD-10-CM | POA: Insufficient documentation

## 2015-03-27 DIAGNOSIS — K649 Unspecified hemorrhoids: Secondary | ICD-10-CM | POA: Insufficient documentation

## 2015-03-27 DIAGNOSIS — Z90710 Acquired absence of both cervix and uterus: Secondary | ICD-10-CM | POA: Insufficient documentation

## 2015-03-27 DIAGNOSIS — M797 Fibromyalgia: Secondary | ICD-10-CM | POA: Diagnosis not present

## 2015-03-27 DIAGNOSIS — Z886 Allergy status to analgesic agent status: Secondary | ICD-10-CM | POA: Insufficient documentation

## 2015-03-27 HISTORY — PX: EVALUATION UNDER ANESTHESIA WITH HEMORRHOIDECTOMY: SHX5624

## 2015-03-27 SURGERY — EXAM UNDER ANESTHESIA WITH HEMORRHOIDECTOMY
Anesthesia: General | Site: Rectum

## 2015-03-27 MED ORDER — BUPIVACAINE LIPOSOME 1.3 % IJ SUSP
20.0000 mL | Freq: Once | INTRAMUSCULAR | Status: AC
  Administered 2015-03-27: 20 mL
  Filled 2015-03-27: qty 20

## 2015-03-27 MED ORDER — FENTANYL CITRATE (PF) 100 MCG/2ML IJ SOLN: 25.0000 ug | INTRAMUSCULAR | Status: DC | PRN

## 2015-03-27 MED ORDER — FENTANYL CITRATE (PF) 100 MCG/2ML IJ SOLN
INTRAMUSCULAR | Status: DC | PRN
Start: 2015-03-27 — End: 2015-03-27
  Administered 2015-03-27: 50 ug via INTRAVENOUS
  Administered 2015-03-27: 25 ug via INTRAVENOUS

## 2015-03-27 MED ORDER — LIDOCAINE HCL (CARDIAC) 20 MG/ML IV SOLN
INTRAVENOUS | Status: AC
  Filled 2015-03-27: qty 5

## 2015-03-27 MED ORDER — LACTATED RINGERS IV SOLN: INTRAVENOUS | Status: DC

## 2015-03-27 MED ORDER — ACETAMINOPHEN 650 MG RE SUPP
650.0000 mg | RECTAL | Status: DC | PRN
  Filled 2015-03-27: qty 1

## 2015-03-27 MED ORDER — LIDOCAINE HCL (CARDIAC) 20 MG/ML IV SOLN
INTRAVENOUS | Status: DC | PRN
  Administered 2015-03-27: 60 mg via INTRAVENOUS

## 2015-03-27 MED ORDER — DEXAMETHASONE SODIUM PHOSPHATE 10 MG/ML IJ SOLN
INTRAMUSCULAR | Status: DC | PRN
  Administered 2015-03-27: 10 mg via INTRAVENOUS

## 2015-03-27 MED ORDER — FLEET ENEMA 7-19 GM/118ML RE ENEM: 1.0000 | ENEMA | Freq: Once | RECTAL | Status: DC

## 2015-03-27 MED ORDER — HEPARIN SODIUM (PORCINE) 5000 UNIT/ML IJ SOLN
5000.0000 [IU] | Freq: Once | INTRAMUSCULAR | Status: AC
  Administered 2015-03-27: 5000 [IU] via SUBCUTANEOUS
  Filled 2015-03-27: qty 1

## 2015-03-27 MED ORDER — FENTANYL CITRATE (PF) 250 MCG/5ML IJ SOLN
INTRAMUSCULAR | Status: AC
  Filled 2015-03-27: qty 5

## 2015-03-27 MED ORDER — ACETAMINOPHEN 325 MG PO TABS
650.0000 mg | ORAL_TABLET | ORAL | Status: DC | PRN
  Administered 2015-03-27: 650 mg via ORAL
  Filled 2015-03-27: qty 2

## 2015-03-27 MED ORDER — LACTATED RINGERS IV SOLN
INTRAVENOUS | Status: DC | PRN
  Administered 2015-03-27: 14:00:00 via INTRAVENOUS

## 2015-03-27 MED ORDER — MIDAZOLAM HCL 2 MG/2ML IJ SOLN
INTRAMUSCULAR | Status: AC
  Filled 2015-03-27: qty 2

## 2015-03-27 MED ORDER — PROPOFOL 10 MG/ML IV BOLUS
INTRAVENOUS | Status: AC
  Filled 2015-03-27: qty 20

## 2015-03-27 MED ORDER — MIDAZOLAM HCL 5 MG/5ML IJ SOLN
INTRAMUSCULAR | Status: DC | PRN
  Administered 2015-03-27: 2 mg via INTRAVENOUS

## 2015-03-27 MED ORDER — SODIUM CHLORIDE 0.9 % IJ SOLN: 3.0000 mL | INTRAMUSCULAR | Status: DC | PRN

## 2015-03-27 MED ORDER — OXYCODONE HCL 5 MG PO TABS: 5.0000 mg | ORAL_TABLET | ORAL | Status: DC | PRN

## 2015-03-27 MED ORDER — 0.9 % SODIUM CHLORIDE (POUR BTL) OPTIME
TOPICAL | Status: DC | PRN
  Administered 2015-03-27: 1000 mL

## 2015-03-27 MED ORDER — CHLORHEXIDINE GLUCONATE 4 % EX LIQD: 1.0000 "application " | Freq: Once | CUTANEOUS | Status: DC

## 2015-03-27 MED ORDER — PROPOFOL 10 MG/ML IV BOLUS
INTRAVENOUS | Status: DC | PRN
  Administered 2015-03-27: 150 mg via INTRAVENOUS
  Administered 2015-03-27: 20 mg via INTRAVENOUS

## 2015-03-27 MED ORDER — DEXAMETHASONE SODIUM PHOSPHATE 10 MG/ML IJ SOLN
INTRAMUSCULAR | Status: AC
  Filled 2015-03-27: qty 1

## 2015-03-27 MED ORDER — SODIUM CHLORIDE 0.9 % IJ SOLN: 3.0000 mL | Freq: Two times a day (BID) | INTRAMUSCULAR | Status: DC

## 2015-03-27 MED ORDER — ONDANSETRON HCL 4 MG/2ML IJ SOLN
INTRAMUSCULAR | Status: AC
  Filled 2015-03-27: qty 2

## 2015-03-27 MED ORDER — SODIUM CHLORIDE 0.9 % IV SOLN: 250.0000 mL | INTRAVENOUS | Status: DC | PRN

## 2015-03-27 SURGICAL SUPPLY — 26 items
BLADE HEX COATED 2.75 (ELECTRODE) ×2 IMPLANT
BLADE SURG 15 STRL LF DISP TIS (BLADE) ×1 IMPLANT
BLADE SURG 15 STRL SS (BLADE) ×2
BRIEF STRETCH FOR OB PAD LRG (UNDERPADS AND DIAPERS) ×2 IMPLANT
DECANTER SPIKE VIAL GLASS SM (MISCELLANEOUS) ×2 IMPLANT
DRSG PAD ABDOMINAL 8X10 ST (GAUZE/BANDAGES/DRESSINGS) IMPLANT
ELECT REM PT RETURN 9FT ADLT (ELECTROSURGICAL) ×2
ELECTRODE REM PT RTRN 9FT ADLT (ELECTROSURGICAL) ×1 IMPLANT
GAUZE SPONGE 4X4 12PLY STRL (GAUZE/BANDAGES/DRESSINGS) ×2 IMPLANT
GAUZE SPONGE 4X4 16PLY XRAY LF (GAUZE/BANDAGES/DRESSINGS) ×2 IMPLANT
GLOVE BIOGEL M 8.0 STRL (GLOVE) ×2 IMPLANT
GOWN SPEC L4 XLG W/TWL (GOWN DISPOSABLE) ×2 IMPLANT
GOWN STRL REUS W/TWL XL LVL3 (GOWN DISPOSABLE) ×6 IMPLANT
KIT BASIN OR (CUSTOM PROCEDURE TRAY) ×2 IMPLANT
LUBRICANT JELLY K Y 4OZ (MISCELLANEOUS) ×2 IMPLANT
NEEDLE HYPO 22GX1.5 SAFETY (NEEDLE) ×2 IMPLANT
NS IRRIG 1000ML POUR BTL (IV SOLUTION) ×2 IMPLANT
PACK LITHOTOMY IV (CUSTOM PROCEDURE TRAY) ×2 IMPLANT
PENCIL BUTTON HOLSTER BLD 10FT (ELECTRODE) ×2 IMPLANT
SHEARS HARMONIC 9CM CVD (BLADE) IMPLANT
SUT CHROMIC 2 0 SH (SUTURE) IMPLANT
SUT CHROMIC 3 0 SH 27 (SUTURE) IMPLANT
SYR 20CC LL (SYRINGE) ×2 IMPLANT
TOWEL OR 17X26 10 PK STRL BLUE (TOWEL DISPOSABLE) ×2 IMPLANT
UNDERPAD 30X30 INCONTINENT (UNDERPADS AND DIAPERS) ×2 IMPLANT
YANKAUER SUCT BULB TIP 10FT TU (MISCELLANEOUS) ×2 IMPLANT

## 2015-03-27 NOTE — Anesthesia Procedure Notes (Signed)
Procedure Name: LMA Insertion Date/Time: 03/27/2015 2:16 PM Performed by: Thornell MuleSTUBBLEFIELD, Marigny Borre G Pre-anesthesia Checklist: Patient identified, Emergency Drugs available, Suction available, Patient being monitored and Timeout performed Patient Re-evaluated:Patient Re-evaluated prior to inductionOxygen Delivery Method: Circle system utilized Preoxygenation: Pre-oxygenation with 100% oxygen Intubation Type: IV induction Ventilation: Mask ventilation without difficulty LMA: LMA inserted LMA Size: 3.0 Number of attempts: 2 Placement Confirmation: positive ETCO2 Tube secured with: Tape

## 2015-03-27 NOTE — Op Note (Signed)
Surgeon: Wenda LowMatt Daiya Tamer, MD, FACS  Asst:  none  Anes:  General by LMA;  20 cc Exparel injection  Procedure: EUA and left lateral sphincterotomy  Diagnosis: Tight anal sphincter with painful defecation  Complications: none  EBL:   Minimal  cc  Drains: none  Description of Procedure:  [facility]  After anesthesia was administered and the patient was prepped a timeout was performed.  In dorsal lithotomy, digital exam performed.  Patient has tight anus without prolapse.  There was a prominent internal sphincter.  With Ferguson retractor, 4 quadrant inspection performed.  Posteriorally there appeared to be chronic tear.  Left lateral internal  distal sphincterotomy performed and digital massage to complete sphincterotomy.  Injection with 20 cc Exparel.    The patient tolerated the procedure well and was taken to the PACU in stable condition.     Matt B. Daphine DeutscherMartin, MD, [facility], [place] [phone]

## 2015-03-27 NOTE — Anesthesia Postprocedure Evaluation (Signed)
  Anesthesia Post-op Note  Patient: Yesenia Boyd  Procedure(s) Performed: Procedure(s) (LRB): EXAM UNDER ANESTHESIA WITH SPHINCTEROTOMY  (N/A)  Patient Location: PACU  Anesthesia Type: General  Level of Consciousness: awake and alert   Airway and Oxygen Therapy: Patient Spontanous Breathing  Post-op Pain: mild  Post-op Assessment: Post-op Vital signs reviewed, Patient's Cardiovascular Status Stable, Respiratory Function Stable, Patent Airway and No signs of Nausea or vomiting  Last Vitals:  Filed Vitals:   03/27/15 1532  BP: 118/82  Pulse: 54  Temp: 36.3 C  Resp: 12    Post-op Vital Signs: stable   Complications: No apparent anesthesia complications

## 2015-03-27 NOTE — Anesthesia Preprocedure Evaluation (Addendum)
Anesthesia Evaluation  Patient identified by MRN, date of birth, ID band Patient awake    Reviewed: Allergy & Precautions, H&P , NPO status , Patient's Chart, lab work & pertinent test results  Airway Mallampati: II  TM Distance: >3 FB Neck ROM: Full    Dental  (+) Missing, Dental Advisory Given,  Both sides upper all missing. Only 4 center teeth present.:   Pulmonary shortness of breath and with exertion, asthma , sleep apnea ,  breath sounds clear to auscultation  Pulmonary exam normal       Cardiovascular Exercise Tolerance: Good negative cardio ROS Normal cardiovascular examRhythm:Regular Rate:Normal  CXR and ECG reviewed.   Neuro/Psych Depression Myasthenia Gravis. No medicines for MG currently, but does sometimes have symptoms of weakness, difficulty swallowing. TIA 2012 TIA Neuromuscular disease CVA, No Residual Symptoms negative psych ROS   GI/Hepatic Neg liver ROS, GERD-  Medicated,  Endo/Other  negative endocrine ROS  Renal/GU negative Renal ROS  negative genitourinary   Musculoskeletal  (+) Fibromyalgia -  Abdominal   Peds negative pediatric ROS (+)  Hematology negative hematology ROS (+)   Anesthesia Other Findings   Reproductive/Obstetrics negative OB ROS                            Anesthesia Physical Anesthesia Plan  ASA: III  Anesthesia Plan: General   Post-op Pain Management:    Induction: Intravenous  Airway Management Planned: LMA  Additional Equipment:   Intra-op Plan:   Post-operative Plan: Extubation in OR  Informed Consent:   Plan Discussed with: Surgeon  Anesthesia Plan Comments:         Anesthesia Quick Evaluation

## 2015-03-27 NOTE — Interval H&P Note (Signed)
History and Physical Interval Note:  03/27/2015 2:06 PM  Yesenia SonsStella L Boyd  has presented today for surgery, with the diagnosis of ANAL SPASMS  The various methods of treatment have been discussed with the patient and family. After consideration of risks, benefits and other options for treatment, the patient has consented to  Procedure(s): EXAM UNDER ANESTHESIA WITH POSSIBLE SPHINCTEROTOMY AND HEMORRHOIDECTOMY (N/A) as a surgical intervention .  The patient's history has been reviewed, patient examined, no change in status, stable for surgery.  I have reviewed the patient's chart and labs.  Questions were answered to the patient's satisfaction.     Jonelle Bann B

## 2015-03-27 NOTE — Transfer of Care (Signed)
Immediate Anesthesia Transfer of Care Note  Patient: Yesenia Boyd  Procedure(s) Performed: Procedure(s): EXAM UNDER ANESTHESIA WITH SPHINCTEROTOMY  (N/A)  Patient Location: PACU  Anesthesia Type:General  Level of Consciousness: awake, alert  and oriented  Airway & Oxygen Therapy: Patient Spontanous Breathing and Patient connected to face mask oxygen  Post-op Assessment: Report given to RN and Post -op Vital signs reviewed and stable  Post vital signs: Reviewed and stable  Last Vitals:  Filed Vitals:   03/27/15 1110  BP: 126/67  Pulse: 67  Temp: 36.6 C  Resp: 16    Complications: No apparent anesthesia complications

## 2015-03-27 NOTE — H&P (View-Only) (Signed)
Yesenia Boyd 02/25/2015 3:13 PM Location: [facility] Patient #: [id number] DOB: 10/01/1956 Married / Language: English / Race: Black or African American Female  History of Present Illness Molli Hazard(Kevante Lunt B. Daphine DeutscherMartin MD; 02/25/2015 3:53 PM) Patient words: hemorrhoids  This is really about rectal pain and hygiene. She was seen by Dr. Darrick PennaFields, [place], who is attempted banding on her with health success. In examining her today she has an external introitus leading up to her external sphincter. This poses difficulty with examination as well as with her own hygiene and wiping. On examination I was able to get my index finger and but this was painful for her and she is tender posteriorly as if she may have a sphincter tear or fissure. I propose we performed a exam under anesthesia with possible sphincterotomy.  I had performed a takedown of her Nissen couple years ago for dysphagia. This is corrected the problem mostly but she still has some intermittent issues with aspiration. She's also had some difficulty with different types of anesthesia including propofol.  We will schedule her for an exam under anesthesia with possible lateral internal sphincterotomy and removal of hemorrhoids as necessary.  The patient is a 59 year old female    Other Problems Fay Records(Ashley Beck, CMA; 02/25/2015 3:14 PM) Asthma Back Pain Cerebrovascular Accident Chest pain Gastroesophageal Reflux Disease General anesthesia - complications Hemorrhoids Inguinal Hernia Migraine Headache Oophorectomy Bilateral. Seizure Disorder Sleep Apnea  Past Surgical History Fay Records(Ashley Beck, CMA; 02/25/2015 3:14 PM) Foot Surgery Bilateral. Gallbladder Surgery - Laparoscopic Laparoscopic Inguinal Hernia Surgery Right. Nissen Fundoplication Oral Surgery Sentinel Lymph Node Biopsy  Diagnostic Studies History Fay Records(Ashley Beck, CMA; 02/25/2015 3:14 PM) Colonoscopy within last year Pap Smear  never  Allergies Fay Records(Ashley Beck, CMA; 02/25/2015 3:16 PM) Avelox *FLUOROQUINOLONES* Linzess *GASTROINTESTINAL AGENTS - MISC.* Swelling. Cephalexin *CEPHALOSPORINS* Cymbalta *ANTIDEPRESSANTS* MetroNIDAZOLE *CHEMICALS*  Medication History Fay Records(Ashley Beck, CMA; 02/25/2015 3:16 PM) [name] (24MCG Capsule, Oral) Active. Azelastine HCl (0.1% Solution, Nasal) Active. Carafate (1GM/10ML Suspension, Oral) Active. HydrOXYzine HCl (25MG  Tablet, Oral) Active. Levocetirizine Dihydrochloride (5MG  Tablet, Oral) Active. Nasonex (50MCG/ACT Suspension, Nasal) Active. Omeprazole (40MG  Capsule DR, Oral) Active. Pantoprazole Sodium (40MG  Tablet DR, Oral) Active. Premarin (0.625MG  Tablet, Oral) Active. TiZANidine HCl (4MG  Tablet, Oral) Active. Triamcinolone Acetonide (0.1% Lotion, External) Active. Medications Reconciled  Social History Fay Records(Ashley Beck, [place]; 02/25/2015 3:14 PM) Alcohol use Occasional alcohol use. Caffeine use Carbonated beverages, Coffee, Tea. Tobacco use Never smoker.  Family History Fay Records(Ashley Beck, [place]; 02/25/2015 3:14 PM) Alcohol Abuse Brother, Father. Arthritis Father, Mother, Sister, Son. Breast Cancer Family Members In General, Sister. Cancer Family Members In General, Father. Colon Cancer Family Members In General. Colon Polyps Daughter. Depression Son. Diabetes Mellitus Brother, Family Members In General, Mother, Sister. Hypertension Brother, Family Members In [place], Father, Mother, Sister. Ischemic Bowel Disease Brother, Daughter, Family Members In [place], Mother, Sister. Kidney Disease Mother. Migraine Headache Sister, Son. Rectal Cancer Family Members In General. Respiratory Condition Brother, Sister. Seizure disorder Family Members In [place], Son. Thyroid problems Brother, Family Members In [place], Father.  Pregnancy / Birth History Fay Records(Ashley Beck, CMA; 02/25/2015 3:14 PM) Age at menarche 13 years. Gravida 2 Maternal age 57-25 Para  2  Review of Systems Fay Records(Ashley Beck CMA; 02/25/2015 3:14 PM) General Present- Chills and Weight Gain. Not Present- Appetite Loss, Fatigue, Fever, Night Sweats and Weight Loss. Skin Present- Dryness, Non-Healing Wounds and Rash. Not Present- Change in Wart/Mole, Hives, Jaundice, New Lesions and Ulcer. HEENT Present- Earache, Hoarseness, Seasonal Allergies, Sinus Pain and Visual Disturbances. Not Present-  Hearing Loss, Nose Bleed, Oral Ulcers, Ringing in the Ears, Sore Throat, Wears glasses/contact lenses and Yellow Eyes. Respiratory Present- Difficulty Breathing. Not Present- Bloody sputum, Chronic Cough, Snoring and Wheezing. Cardiovascular Present- Leg Cramps, Shortness of Breath and Swelling of Extremities. Not Present- Chest Pain, Difficulty Breathing Lying Down, Palpitations and Rapid Heart Rate. Gastrointestinal Present- Abdominal Pain, Bloating, Bloody Stool, Constipation, Gets full quickly at meals, Hemorrhoids and Rectal Pain. Not Present- Change in Bowel Habits, Chronic diarrhea, Difficulty Swallowing, Excessive gas, Indigestion, Nausea and Vomiting. Female Genitourinary Not Present- Frequency, Nocturia, Painful Urination, Pelvic Pain and Urgency. Neurological Present- Headaches and Weakness. Not Present- Decreased Memory, Fainting, Numbness, Seizures, Tingling, Tremor and Trouble walking. Psychiatric Present- Change in Sleep Pattern. Not Present- Anxiety, Bipolar, Depression, Fearful and Frequent crying. Endocrine Present- Cold Intolerance and Hair Changes. Not Present- Excessive Hunger, Heat Intolerance, Hot flashes and New Diabetes. Hematology Present- Easy Bruising. Not Present- Excessive bleeding, Gland problems, HIV and Persistent Infections.   Vitals Fay Records(Ashley Beck CMA; 02/25/2015 3:17 PM) 02/25/2015 3:16 PM Weight: 152 lb Height: 63in Body Surface Area: 1.75 m Body Mass Index: 26.93 kg/m Temp.: 97.22F(Oral)  Pulse: 77 (Regular)  Resp.: 18 (Unlabored)  BP: 128/72  (Sitting, Left Arm, Standard)    Physical Exam (Luca Burston B. Daphine DeutscherMartin MD; 02/25/2015 3:54 PM) The physical exam findings are as follows: Note:HEENT negative NECK supple without masses CHEST clear HEART SR without murmurs or gallops ABDOMEN nontender Rectal--there appears to be an external introitus lined by skin that leads into the anus. Tender to exam. Will need EUA and sphincterotomy. I explained this to her and her daughter.    Assessment & Plan Molli Hazard(Benito Lemmerman B. 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

## 2015-03-27 NOTE — Discharge Instructions (Signed)
Anal Fissure, Adult An anal fissure is a small tear or crack in the skin around the anus. Bleeding from a fissure usually stops on its own within a few minutes. However, bleeding will often reoccur with each bowel movement until the crack heals.  CAUSES   Passing large, hard stools.  Frequent diarrheal stools.  Constipation.  Inflammatory bowel disease (Crohn's disease or ulcerative colitis).  Infections.  Anal sex. SYMPTOMS   Small amounts of blood seen on your stools, on toilet paper, or in the toilet after a bowel movement.  Rectal bleeding.  Painful bowel movements.  Itching or irritation around the anus. DIAGNOSIS Your caregiver will examine the anal area. An anal fissure can usually be seen with careful inspection. A rectal exam may be performed and a short tube (anoscope) may be used to examine the anal canal. TREATMENT   You may be instructed to take fiber supplements. These supplements can soften your stool to help make bowel movements easier.  Sitz baths may be recommended to help heal the tear. Do not use soap in the sitz baths.  A medicated cream or ointment may be prescribed to lessen discomfort. HOME CARE INSTRUCTIONS   Maintain a diet high in fruits, whole grains, and vegetables. Avoid constipating foods like bananas and dairy products.  Take sitz baths as directed by your caregiver.  Drink enough fluids to keep your urine clear or pale yellow.  Only take over-the-counter or prescription medicines for pain, discomfort, or fever as directed by your caregiver. Do not take aspirin as this may increase bleeding.  Do not use ointments containing numbing medications (anesthetics) or hydrocortisone. They could slow healing. SEEK MEDICAL CARE IF:   Your fissure is not completely healed within 3 days.  You have further bleeding.  You have a fever.  You have diarrhea mixed with blood.  You have pain.  Your problem is getting worse rather than  better. MAKE SURE YOU:   Understand these instructions.  Will watch your condition.  Will get help right away if you are not doing well or get worse. Document Released: 10/24/2005 Document Revised: 01/16/2012 Document Reviewed: 04/10/2011 ExitCare Patient Information 2015 [employer]. This information is not intended to replace advice given to you by your health care provider. Make sure you discuss any questions you have with your health care provider.  

## 2015-03-30 ENCOUNTER — Encounter (HOSPITAL_COMMUNITY): Payer: Self-pay | Admitting: Surgery

## 2015-03-31 DIAGNOSIS — Z6826 Body mass index (BMI) 26.0-26.9, adult: Secondary | ICD-10-CM | POA: Diagnosis not present

## 2015-03-31 DIAGNOSIS — K210 Gastro-esophageal reflux disease with esophagitis: Secondary | ICD-10-CM | POA: Diagnosis not present

## 2015-03-31 DIAGNOSIS — J309 Allergic rhinitis, unspecified: Secondary | ICD-10-CM | POA: Diagnosis not present

## 2015-04-01 ENCOUNTER — Encounter (HOSPITAL_COMMUNITY): Payer: Self-pay | Admitting: Surgery

## 2015-04-09 ENCOUNTER — Ambulatory Visit (HOSPITAL_COMMUNITY): Payer: Self-pay | Admitting: Psychology

## 2015-04-15 NOTE — Progress Notes (Signed)
cc'ed to pcp °

## 2015-04-16 NOTE — Progress Notes (Signed)
      PROGRESS NOTE  Patient:  Yesenia Boyd   DOB: 10/30/1956  MR Number: [id number]  Location: [facility] [address] [place] [address] Dept: [phone]  Start: 4 PM  End: 5 PM  Provider/Observer:     Hershal Coria PSYD  Chief Complaint:      Chief Complaint  Patient presents with  . Anxiety  . Depression    Reason For Service:     The patient initially presented with issues of depression, anxiety, and adjustment difficulties. She has had a lot of traumatic and stressful events in her life that she has difficulty coping with. The patient has numerous medical issues that can be found in her medical chart. Many these have some components to difficulty with sympathetic arousal and anxiety issues.  Interventions Strategy:  Cognitive/behavioral psychotherapeutic interventions  Participation Level:   Active  Participation Quality:  Appropriate      Behavioral Observation:  Well Groomed, Alert, and Appropriate.   Current Psychosocial Factors: The patient reports that she has been doing much better as far as keeping her family from expecting too much and demanding too much from her. She reports that this is really helped her mood..  Content of Session:   Reviewed current symptoms and continued work on therapeutic interventions for depression and anxiety.  Current Status:   the patient reports that her anxiety and depression significantly improved a she has been drawn better boundaries for people like her son, her husband, and her daughter..  Patient Progress:   Very good  Target Goals:   Target goals include reducing the intensity, duration, and frequency of significant episodes of depression and anxiety.  Last Reviewed:   12/18/2014  Goals Addressed Today:    Today we worked on Producer, television/film/video and strategies are in issues of her depression and  anxiety.  Impression/Diagnosis:   The patient is a long-standing history of recurrent depression and anxiety. Fibromyalgia and irritable bowel syndrome are also present. 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  Diagnosis:    Axis I:  Major depressive disorder, recurrent episode, moderate  Anxiety      Axis II: No diagnosis     Zimri Brennen R, PsyD 04/16/2015

## 2015-04-17 ENCOUNTER — Ambulatory Visit (INDEPENDENT_AMBULATORY_CARE_PROVIDER_SITE_OTHER): Payer: 59 | Admitting: Psychology

## 2015-04-17 DIAGNOSIS — F331 Major depressive disorder, recurrent, moderate: Secondary | ICD-10-CM

## 2015-04-17 DIAGNOSIS — F419 Anxiety disorder, unspecified: Secondary | ICD-10-CM

## 2015-04-20 ENCOUNTER — Encounter (HOSPITAL_COMMUNITY): Payer: Self-pay | Admitting: Psychology

## 2015-04-20 NOTE — Progress Notes (Signed)
      PROGRESS NOTE  Patient:  Yesenia Boyd   DOB: 10/27/1956  MR Number: [id number]  Location: BEHAVIORAL [facility] [address] [place] [address] Dept: [phone]  Start: 3 PM  End: 4 PM  Provider/Observer:     Hershal Coria PSYD  Chief Complaint:      Chief Complaint  Patient presents with  . Depression  . Anxiety  . Stress    Reason For Service:     The patient initially presented with issues of depression, anxiety, and adjustment difficulties. She has had a lot of traumatic and stressful events in her life that she has difficulty coping with. The patient has numerous medical issues that can be found in her medical chart. Many these have some components to difficulty with sympathetic arousal and anxiety issues.  Interventions Strategy:  Cognitive/behavioral psychotherapeutic interventions  Participation Level:   Active  Participation Quality:  Appropriate      Behavioral Observation:  Well Groomed, Alert, and Appropriate.   Current Psychosocial Factors: The patient reports that she has been doing much better as far as keeping her family from expecting too much and demanding too much from her. She reports that this is really helped her mood..  Content of Session:   Reviewed current symptoms and continued work on therapeutic interventions for depression and anxiety.  Current Status:   the patient reports that her anxiety and depression significantly improved a she has been drawn better boundaries for people like her son, her husband, and her daughter..  Patient Progress:   Very good  Target Goals:   Target goals include reducing the intensity, duration, and frequency of significant episodes of depression and anxiety.  Last Reviewed:   03/16/2015  Goals Addressed Today:    Today we worked on Producer, television/film/video and strategies are in issues of her depression and  anxiety.  Impression/Diagnosis:   The patient is a long-standing history of recurrent depression and anxiety. Fibromyalgia and irritable bowel syndrome are also present. 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  Diagnosis:    Axis I:  Major depressive disorder, recurrent episode, moderate  Anxiety      Axis II: No diagnosis     Sirus Labrie R, PsyD 04/20/2015

## 2015-04-29 NOTE — Progress Notes (Signed)
      PROGRESS NOTE  Patient:  Yesenia Boyd   DOB: 02-17-1956  MR Number: [id number]  Location: BEHAVIORAL [facility] [address] [place] [address] Dept: [phone]  Start: 3 PM  End: 4 PM  Provider/Observer:     Hershal Coria PSYD  Chief Complaint:      No chief complaint on file.   Reason For Service:     The patient initially presented with issues of depression, anxiety, and adjustment difficulties. She has had a lot of traumatic and stressful events in her life that she has difficulty coping with. The patient has numerous medical issues that can be found in her medical chart. Many these have some components to difficulty with sympathetic arousal and anxiety issues.  Interventions Strategy:  Cognitive/behavioral psychotherapeutic interventions  Participation Level:   Active  Participation Quality:  Appropriate      Behavioral Observation:  Well Groomed, Alert, and Appropriate.   Current Psychosocial Factors: The patient reports that she has been doing much better as far as keeping her family from expecting too much and demanding too much from her. She reports that this is really helped her mood..  Content of Session:   Reviewed current symptoms and continued work on therapeutic interventions for depression and anxiety.  Current Status:   the patient reports that her anxiety and depression significantly improved a she has been drawn better boundaries for people like her son, her husband, and her daughter..  Patient Progress:   Very good  Target Goals:   Target goals include reducing the intensity, duration, and frequency of significant episodes of depression and anxiety.  Last Reviewed:   02/21/2015  Goals Addressed Today:    Today we worked on Producer, television/film/video and strategies are in issues of her depression and anxiety.  Impression/Diagnosis:   The patient is a long-standing  history of recurrent depression and anxiety. Fibromyalgia and irritable bowel syndrome are also present. 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  Diagnosis:    Axis I:  Major depressive disorder, recurrent episode, moderate  Anxiety      Axis II: No diagnosis     RODENBOUGH,JOHN R, PsyD 04/29/2015

## 2015-05-15 ENCOUNTER — Ambulatory Visit (HOSPITAL_COMMUNITY): Payer: Self-pay | Admitting: Psychology

## 2015-06-01 DIAGNOSIS — M545 Low back pain: Secondary | ICD-10-CM | POA: Diagnosis not present

## 2015-06-07 NOTE — Progress Notes (Signed)
REVIEWED-NO ADDITIONAL RECOMMENDATIONS. 

## 2015-06-17 ENCOUNTER — Ambulatory Visit (INDEPENDENT_AMBULATORY_CARE_PROVIDER_SITE_OTHER): Payer: 59 | Admitting: Psychology

## 2015-06-17 DIAGNOSIS — F419 Anxiety disorder, unspecified: Secondary | ICD-10-CM | POA: Diagnosis not present

## 2015-06-17 DIAGNOSIS — F331 Major depressive disorder, recurrent, moderate: Secondary | ICD-10-CM | POA: Diagnosis not present

## 2015-07-17 ENCOUNTER — Ambulatory Visit (HOSPITAL_COMMUNITY): Payer: 59 | Admitting: Psychology

## 2015-07-17 DIAGNOSIS — Z23 Encounter for immunization: Secondary | ICD-10-CM | POA: Diagnosis not present

## 2015-08-04 ENCOUNTER — Encounter (HOSPITAL_COMMUNITY): Payer: Self-pay | Admitting: Psychology

## 2015-08-04 NOTE — Progress Notes (Signed)
      PROGRESS NOTE  Patient:  Yesenia Boyd   DOB: Oct 06, 1956  MR Number: [id number]  Location: BEHAVIORAL [facility] [address] [place] [address] Dept: [phone]  Start: 10 AM  End: 11 AM  Provider/Observer:     Hershal Coria PSYD  Chief Complaint:      Chief Complaint  Patient presents with  . Anxiety  . Depression  . Stress    Reason For Service:     The patient initially presented with issues of depression, anxiety, and adjustment difficulties. She has had a lot of traumatic and stressful events in her life that she has difficulty coping with. The patient has numerous medical issues that can be found in her medical chart. Many these have some components to difficulty with sympathetic arousal and anxiety issues.  Interventions Strategy:  Cognitive/behavioral psychotherapeutic interventions  Participation Level:   Active  Participation Quality:  Appropriate      Behavioral Observation:  Well Groomed, Alert, and Appropriate.   Current Psychosocial Factors: The patient reports that she has been doing a little bit better as far as her physical status. She reports that there continue to be a lot of stressors from her kids as well as extended family members. The patient reports that she and her husband have been doing better. We continue to work on Pharmacologist.  Content of Session:   Reviewed current symptoms and continued work on therapeutic interventions for depression and anxiety.  Current Status:   the patient reports that her anxiety and depression significantly improved a she has been drawn better boundaries for people like her son, her husband, and her daughter..  Patient Progress:   Very good  Target Goals:   Target goals include reducing the intensity, duration, and frequency of significant episodes of depression and anxiety.  Last Reviewed:   05/17/2015  Goals Addressed  Today:    Today we worked on Producer, television/film/video and strategies are in issues of her depression and anxiety.  Impression/Diagnosis:   The patient is a long-standing history of recurrent depression and anxiety. Fibromyalgia and irritable bowel syndrome are also present. 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  Diagnosis:    Axis I:  Major depressive disorder, recurrent episode, moderate  Anxiety      Axis II: No diagnosis     RODENBOUGH,JOHN R, PsyD 08/04/2015

## 2015-08-04 NOTE — Progress Notes (Signed)
      PROGRESS NOTE  Patient:  Yesenia Boyd   DOB: 04/01/1956  MR Number: [id number]  Location: BEHAVIORAL [facility] [address] [place] [address] Dept: [phone]  Start: 10 AM  End: 11 AM  Derriona Branscom/Observer:     Hershal Coria PSYD  Chief Complaint:      Chief Complaint  Patient presents with  . Depression  . Anxiety  . Stress    Reason For Service:     The patient initially presented with issues of depression, anxiety, and adjustment difficulties. She has had a lot of traumatic and stressful events in her life that she has difficulty coping with. The patient has numerous medical issues that can be found in her medical chart. Many these have some components to difficulty with sympathetic arousal and anxiety issues.  Interventions Strategy:  Cognitive/behavioral psychotherapeutic interventions  Participation Level:   Active  Participation Quality:  Appropriate      Behavioral Observation:  Well Groomed, Alert, and Appropriate.   Current Psychosocial Factors: The patient reports that she has been doing a little bit better as far as her physical status. She reports that there continue to be a lot of stressors from her kids as well as extended family members. The patient reports that she and her husband have been doing better. We continue to work on Pharmacologist.  Content of Session:   Reviewed current symptoms and continued work on therapeutic interventions for depression and anxiety.  Current Status:   the patient reports that her anxiety and depression significantly improved a she has been drawn better boundaries for people like her son, her husband, and her daughter..  Patient Progress:   Very good  Target Goals:   Target goals include reducing the intensity, duration, and frequency of significant episodes of depression and anxiety.  Last Reviewed:   06/17/2015  Goals Addressed  Today:    Today we worked on Producer, television/film/video and strategies are in issues of her depression and anxiety.  Impression/Diagnosis:   The patient is a long-standing history of recurrent depression and anxiety. Fibromyalgia and irritable bowel syndrome are also present. 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  Diagnosis:    Axis I:  Major depressive disorder, recurrent episode, moderate  Anxiety      Axis II: No diagnosis     RODENBOUGH,JOHN R, PsyD 08/04/2015

## 2015-08-10 ENCOUNTER — Ambulatory Visit (INDEPENDENT_AMBULATORY_CARE_PROVIDER_SITE_OTHER): Payer: 59 | Admitting: Psychology

## 2015-08-10 DIAGNOSIS — F419 Anxiety disorder, unspecified: Secondary | ICD-10-CM

## 2015-08-10 DIAGNOSIS — F331 Major depressive disorder, recurrent, moderate: Secondary | ICD-10-CM | POA: Diagnosis not present

## 2015-08-17 ENCOUNTER — Encounter (HOSPITAL_COMMUNITY): Payer: Self-pay | Admitting: Psychology

## 2015-08-17 NOTE — Progress Notes (Signed)
      PROGRESS NOTE  Patient:  Yesenia Boyd   DOB: November 01, 1956  MR Number: [id number]  Location: BEHAVIORAL [facility] [address] [place] [address] Dept: [phone]  Start: 3 PM  End: 4 PM  Provider/Observer:     Hershal Coria PSYD  Chief Complaint:      Chief Complaint  Patient presents with  . Anxiety  . Depression  . Stress    Reason For Service:     The patient initially presented with issues of depression, anxiety, and adjustment difficulties. She has had a lot of traumatic and stressful events in her life that she has difficulty coping with. The patient has numerous medical issues that can be found in her medical chart. Many these have some components to difficulty with sympathetic arousal and anxiety issues.  Interventions Strategy:  Cognitive/behavioral psychotherapeutic interventions  Participation Level:   Active  Participation Quality:  Appropriate      Behavioral Observation:  Well Groomed, Alert, and Appropriate.   Current Psychosocial Factors: The patient reports that she has continued to do better for the most part but family stressors continue to be quite problematic for her. The situation between her husband and herself has continued to be doing quite well but stressors associated with other family members continue.  Content of Session:   Reviewed current symptoms and continued work on therapeutic interventions for depression and anxiety.  Current Status:   the patient reports that her anxiety and depression significantly improved a she has been drawn better boundaries for people like her son, her husband, and her daughter..  Patient Progress:   Very good  Target Goals:   Target goals include reducing the intensity, duration, and frequency of significant episodes of depression and anxiety.  Last Reviewed:   08/10/2015  Goals Addressed Today:    Today we worked on  Producer, television/film/video and strategies are in issues of her depression and anxiety.  Impression/Diagnosis:   The patient is a long-standing history of recurrent depression and anxiety. Fibromyalgia and irritable bowel syndrome are also present. 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  Diagnosis:    Axis I:  Major depressive disorder, recurrent episode, moderate (HCC)  Anxiety      Axis II: No diagnosis     Aleyah Balik R, PsyD 08/17/2015

## 2015-08-26 ENCOUNTER — Ambulatory Visit (INDEPENDENT_AMBULATORY_CARE_PROVIDER_SITE_OTHER): Payer: 59 | Admitting: Gastroenterology

## 2015-08-26 ENCOUNTER — Encounter: Payer: Self-pay | Admitting: Gastroenterology

## 2015-08-26 VITALS — BP 113/72 | HR 70 | Temp 97.0°F | Ht 63.0 in | Wt 149.2 lb

## 2015-08-26 DIAGNOSIS — K582 Mixed irritable bowel syndrome: Secondary | ICD-10-CM | POA: Diagnosis not present

## 2015-08-26 DIAGNOSIS — R1310 Dysphagia, unspecified: Secondary | ICD-10-CM | POA: Diagnosis not present

## 2015-08-26 DIAGNOSIS — K6289 Other specified diseases of anus and rectum: Secondary | ICD-10-CM

## 2015-08-26 DIAGNOSIS — K219 Gastro-esophageal reflux disease without esophagitis: Secondary | ICD-10-CM | POA: Diagnosis not present

## 2015-08-26 NOTE — Assessment & Plan Note (Signed)
CAN'T TOLERATE PPIs. SYMPTOMS FAIRLY WELL CONTROLLED.  CONTINUE ZANTAC. AVOID TRIGGERS FOR REFLUX FOLLOW UP IN 6 MOS.

## 2015-08-26 NOTE — Assessment & Plan Note (Signed)
SYMPTOMS FAIRLY WELL CONTROLLED.  CONTINUE TO MONITOR SYMPTOMS. 

## 2015-08-26 NOTE — Assessment & Plan Note (Signed)
SYMPTOMS FAIRLY WELL CONTROLLED. DOESN'T TOLERATE WATER.  EAT FIBER ADD FDGARD 2 WITH JUICE CALL IN ONE MONTH IF SYMPTOMS ARE NOT IMPROVED OR YOU WOULD LIKE A REFERRAL TO [facility] FOLLOW UP IN 6 MOS.  .Marland Kitchen

## 2015-08-26 NOTE — Progress Notes (Signed)
Subjective:    Patient ID: Yesenia Boyd, female    DOB: [birth year], 59 y.o.   MRN: [id number]  Toma Deiters, MD   HPI LAST SEEN MAY 2016: 148 LBS. NAUSEA BEFORE GOING TO THE BATHROOM. ABDOMINAL SPASM(PAIN, LOWER ABD PRESSURE) BETTER AFTER HAS BM. HEARTBURN: NOT CONTROLLED. REFLUXES A LOT. CAN'T EAT BREAD AT ALL. DOES JUICE BLENDS. EATS EGGS OR FRIED APPLES. STAYS COLD. REGURGITATE AND MAY NEED A BASIN(EVERY OTHER MO). NO BRIGHT RED BLOOD ?? BUT DRINKS GRAPE JUICE. BOWLES: #1, 7, OR 5. SOB: SAME. MAY HAVE BURNING CHEST PAIN LIKE SHE'S ON FIRE(1X/MO). SWALLOWING OM AS LONG AS SHE DOESN'T DRINK WATER WHEN IT COMES UP. USES VASELINE PRN FOR RECTAL PAIN/PRESSURE. HEMORRHOIDS CAN DROP OUT AND SHE'LL PUSH IT BACK IN.  PT DENIES FEVER, CHILLS, HEMATEMESIS, vomiting, melena, CHEST PAIN, SHORTNESS OF BREATH,  CHANGE IN BOWEL IN HABITS, OR problems swallowing  Past Medical History  Diagnosis Date  . IBS (irritable bowel syndrome)   . S/P endoscopy 2008, Aug 2010, Sept 2012    Dr. Linna Darner 2008: removal of impacted food bolus, Dr. Linna Darner 2010: moderate gastritis, Sept 2012 with SLF: path with mild gastritis, no definite stricture noted, dilation with Savary 16 mm  . S/P colonoscopy April 2009, Sept 2012    normal Dr. Linna Darner, internal hemorrhoids, repeat in Sept 2022  . Numbness and tingling in right hand     and whole left side of body-pt sts she has "spells to where she cannot walk or talk"  . Sleep apnea     no cpap used, was told to get oxygen  to use  . Bile reflux gastritis   . TIA (transient ischemic attack) 2012  . Asthma   . Peripheral neuropathy (HCC)   . Myasthenia gravis     right sided weakness occ.  . Fibromyalgia     sees Dr Philis Kendall at [facility]  . Myasthenia gravis in remission (HCC)   . Neuropathy (HCC)   . GERD (gastroesophageal reflux disease)   . Stroke [facility])     TIA 2010  . Shortness of breath dyspnea     pt believes it's related to asthma  . Depression     pt believes it's  related to multiple sicknesses   Past Surgical History  Procedure Laterality Date  . Nissen fundoplication      twice  . Cholecystectomy    . Abdominal hysterectomy      complete per patient  . Esophageal surgery?      2008 [facility]  . Hand surgery      carpal tumnnel right and removal of cyst left  . Neck surgery      removal of "knot" from neck  . Bladder surgery      stretch bladder opening  . Savory dilation  07/18/2011    Surgeon:Lurdes Haltiwanger M Amika Tassin  . Esophageal manometry N/A 01/21/2013    Procedure: ESOPHAGEAL MANOMETRY (EM);  Surgeon: Valarie Merino, MD;  Location: [facility];  Service: General;  Laterality: N/A;  . Laparoscopic nissen fundoplication N/A 02/13/2013    Procedure: Redo LAPAROSCOPIC NISSEN FUNDOPLICATION;  Surgeon: Valarie Merino, MD;  Location: [facility];  Service: General;  Laterality: N/A;  Forgut explortation with partial  takedown nissan funliplication from 1994 for wrap torsion and persistant dysphagia  . Flexible sigmoidoscopy N/A 12/09/2014    SLF: Rectal pain most likely due to internal and external hemorroids  . Hemorrhoid banding N/A 12/09/2014    Procedure: HEMORRHOID BANDING;  Surgeon: West BaliSandi L Leviticus Harton, MD;  Location: [facility];  Service: Endoscopy;  Laterality: N/A;  . Colonoscopy  07/18/2011    SLF: 1. internal hemorrhoids  . Esophagogastroduodenoscopy  07/18/2011    SLF: 1. Moderate gastritis 2. Dysphagia most likely 2o large food bolus moving through her Nissen, which appears to be intact. Pt has poor denttition and NL BPE 2 years ago. 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  . Evaluation under anesthesia with hemorrhoidectomy N/A 03/27/2015    Procedure: EXAM UNDER ANESTHESIA WITH SPHINCTEROTOMY ;  Surgeon: Luretha MurphyMatthew Martin, MD;  Location: [facility];  Service: General;  Laterality: N/A;   Allergies  Allergen Reactions  . Avelox [Moxifloxacin Hcl In Nacl] Anaphylaxis and Itching  . Linzess [Linaclotide] Swelling    Swelling,  bloating, nausea.Med used for "IBS"  . Adhesive [Tape] Other (See Comments)    Red and itchy under tape  . Carbamazepine Other (See Comments)    Memory loss   . Cephalexin Hives and Itching  . Codeine Hives and Itching  . Cymbalta [Duloxetine Hcl] Other (See Comments)    Hair loss, nervousness, severe constipation, red blotches  . Darvon [Propoxyphene] Hives  . Dicyclomine Itching and Other (See Comments)    Nervousness  . Latex Itching  . Macrolides And Ketolides Swelling  . Meperidine Hcl Itching and Other (See Comments)    Nervousness  . Metronidazole Hives and Other (See Comments)    Red blotches.  . Morphine Itching and Other (See Comments)    Nervousness  . Penicillins Hives  . Pepcid [Famotidine] Nausea And Vomiting  . Prednisone Hives, Swelling and Other (See Comments)    Red blotches - Can take with Benadryl  . Propofol     Depression   . Protonix [Pantoprazole] Swelling  . Sulfonamide Derivatives Hives  . Tetracycline Other (See Comments)    Reaction unknown  . Tramadol Hcl Hives and Other (See Comments)    Nervousness    Current Outpatient Prescriptions  Medication Sig Dispense Refill  . acetaminophen (TYLENOL) 650 MG CR tablet Take 650 mg by mouth every 8 (eight) hours as needed for pain.     Marland Kitchen. albuterol (PROVENTIL HFA;VENTOLIN HFA) 108 (90 BASE) MCG/ACT inhaler Inhale 2 puffs into the lungs every 4 (four) hours as needed for shortness of breath.    . Ascorbic Acid (VITAMELTS VITAMIN C PO) Take 1 tablet by mouth daily.    Marland Kitchen. azelastine (ASTELIN) 137 MCG/SPRAY nasal spray Place 1 spray into the nose 2 (two) times daily.     . beclomethasone (QVAR) 80 MCG/ACT inhaler Inhale 2 puffs into the lungs daily.    Marland Kitchen. CARAFATE 1 GM/10ML suspension Take 1 g by mouth 4 (four) times daily.  4X/DAY   . Cholecalciferol ([employer] VIT D 5000 HIGH-POTENCY) 5000 UNITS capsule Take 5,000 Units by mouth every morning.     Marland Kitchen. EPINEPHrine 0.3 mg/0.3 mL IJ SOAJ injection Inject 0.3 mg into the  muscle as needed (allergic reaction).    Marland Kitchen. estrogens, conjugated, (PREMARIN) 0.625 MG tablet Take 0.625 mg by mouth daily.     . Guaifenesin 1200 MG TB12 Take 1 tablet by mouth 2 (two) times daily as needed (Congestion).    . hydrocortisone (ANUSOL-HC) 2.5 % rectal cream Place 1 application rectally 2 (two) times daily as needed for hemorrhoids or itching.    . hydrOXYzine (VISTARIL) 25 MG capsule Take 25 mg by mouth at bedtime.     Marland Kitchen. L-Methylfolate-B6-B12 (METANX PO) Take  1 tablet by mouth at bedtime.     Marland Kitchen levocetirizine (XYZAL) 5 MG tablet Take 5 mg by mouth every evening.     . lubiprostone ([name]) 24 MCG capsule Take 24 mcg by mouth 2 (two) times daily with a meal.      . magnesium citrate solution Take 296 mLs by mouth daily as needed. For blockage    . NASONEX 50 MCG/ACT nasal spray Place 2 sprays into the nose daily.     Marland Kitchen PRESCRIPTION MEDICATION Apply 1 application topically every morning. LCD10% with Salicylic Acid 2%. Use as directed for skin and rashes.    . Propylene Glycol-Glycerin (SOOTHE OP) Apply 1 drop to eye 3 (three) times daily as needed (Dry eyes).    . ranitidine (ZANTAC) 300 MG tablet Take 300 mg by mouth at bedtime.    . simethicone (GAS-X) 80 MG chewable tablet Chew 80 mg by mouth every 6 (six) hours as needed. For gas    . sodium chloride (OCEAN) 0.65 % SOLN nasal spray Place 1 spray into both nostrils daily as needed for congestion.    Marland Kitchen tiZANidine (ZANAFLEX) 4 MG tablet Take 4 mg by mouth at bedtime.     . triamcinolone (KENALOG) 0.1 % cream Apply 1 application topically 2 (two) times daily as needed (For hair). For skin.    Marland Kitchen triamcinolone lotion (KENALOG) 0.1 % Apply 1 application topically 2 (two) times daily as needed (Skin rashes).     . vitamin E 1000 UNIT capsule Take 1,000 Units by mouth daily.     Review of Systems PER HPI OTHERWISE ALL SYSTEMS ARE NEGATIVE.    Objective:   Physical Exam  Constitutional: She is oriented to person, place, and time. She  appears well-developed and well-nourished. No distress.  HENT:  Head: Normocephalic and atraumatic.  Mouth/Throat: Oropharynx is clear and moist. No oropharyngeal exudate.  Eyes: Pupils are equal, round, and reactive to light. No scleral icterus.  Neck: Normal range of motion. Neck supple.  Cardiovascular: Normal rate, regular rhythm and normal heart sounds.   Pulmonary/Chest: Effort normal and breath sounds normal. No respiratory distress.  Abdominal: Soft. Bowel sounds are normal. She exhibits no distension. There is tenderness. There is no rebound and no guarding.  MILD LUQ TTP, MILD PERI-UMBILICAL TTP  Musculoskeletal: She exhibits no edema.  Lymphadenopathy:    She has no cervical adenopathy.  Neurological: She is alert and oriented to person, place, and time.  NO  NEW FOCAL DEFICITS   Psychiatric:  SLIGHTLY ANXIOUS MOOD, NL AFFECT  Vitals reviewed.         Assessment & Plan:

## 2015-08-26 NOTE — Patient Instructions (Signed)
EAT FIBER AS TOLERATED.  ADD FDGARD 2 WITH 8 OZ JUICE.  PLEASE CALL IN ONE MONTH IF YOUR SYMPTOMS ARE NOT IMPROVED OR YOU WOULD LIKE A REFERRAL TO [facility].  CONTINUE ZANTAC. ZANTAC HELPS MOST WHEN USED AS NEEDED. USE FOR 5 OUT OF 7 DAYS A WEEK.  AVOID TRIGGERS FOR REFLUX. SEE INFO BELOW.  FOLLOW UP IN 6 MOS.    Lifestyle and home remedies TO HELP CONTROL HEARTBURN/REGURGITATION.  You may eliminate or reduce the frequency of heartburn by making the following lifestyle changes:  . Control your weight. Being overweight is a major risk factor for heartburn and GERD. Excess pounds put pressure on your abdomen, pushing up your stomach and causing acid to back up into your esophagus.   . Eat smaller meals. 4 TO 6 MEALS A DAY. This reduces pressure on the lower esophageal sphincter, helping to prevent the valve from opening and acid from washing back into your esophagus.   Allena Earing. Loosen your belt. Clothes that fit tightly around your waist put pressure on your abdomen and the lower esophageal sphincter.  .  . Eliminate heartburn triggers. Everyone has specific triggers.Common triggers such as fatty or fried foods, spicy food, tomato sauce, carbonated beverages, alcohol, chocolate, mint, garlic, onion, caffeine and nicotine may make heartburn worse.   Marland Kitchen. Avoid stooping or bending. Tying your shoes is OK. Bending over for longer periods to weed your garden isn't, especially soon after eating.   . Don't lie down after a meal. Wait at least three to four hours after eating before going to bed, and don't lie down right after eating.   Marland Kitchen. PLACE THE HEAD OF YOUR BED ON 6 INCH BLOCKS.  Alternative medicine . Several home remedies exist for treating GERD, but they provide only temporary relief. They include drinking baking soda (sodium bicarbonate) added to water or drinking other fluids such as baking soda mixed with cream of tartar and water. . Although these liquids create temporary relief by  neutralizing, washing away or buffering acids, eventually they aggravate the situation by adding gas and fluid to your stomach, increasing pressure and causing more acid reflux. Further, adding more sodium to your diet may increase your blood pressure and add stress to your heart, and excessive bicarbonate ingestion can alter the acid-base balance in your body.

## 2015-08-26 NOTE — Progress Notes (Signed)
PT ON RECALL

## 2015-08-26 NOTE — Assessment & Plan Note (Signed)
SYMPTOMS NOT IDEALLY CONTROLLED.  CONTINUE MEDICAL MANAGEMENT. CONTINUE TO MONITOR SYMPTOMS. FOLLOW UP IN 6 MOS.

## 2015-08-27 NOTE — Progress Notes (Signed)
cc'ed to pcp °

## 2015-08-31 ENCOUNTER — Ambulatory Visit (INDEPENDENT_AMBULATORY_CARE_PROVIDER_SITE_OTHER): Payer: 59 | Admitting: Psychology

## 2015-08-31 DIAGNOSIS — F331 Major depressive disorder, recurrent, moderate: Secondary | ICD-10-CM

## 2015-08-31 DIAGNOSIS — F419 Anxiety disorder, unspecified: Secondary | ICD-10-CM | POA: Diagnosis not present

## 2015-09-01 ENCOUNTER — Other Ambulatory Visit: Payer: Medicare Other | Admitting: Obstetrics and Gynecology

## 2015-09-03 DIAGNOSIS — K210 Gastro-esophageal reflux disease with esophagitis: Secondary | ICD-10-CM | POA: Diagnosis not present

## 2015-09-03 DIAGNOSIS — M545 Low back pain: Secondary | ICD-10-CM | POA: Diagnosis not present

## 2015-09-03 DIAGNOSIS — B308 Other viral conjunctivitis: Secondary | ICD-10-CM | POA: Diagnosis not present

## 2015-09-03 DIAGNOSIS — R5383 Other fatigue: Secondary | ICD-10-CM | POA: Diagnosis not present

## 2015-09-03 DIAGNOSIS — Z6826 Body mass index (BMI) 26.0-26.9, adult: Secondary | ICD-10-CM | POA: Diagnosis not present

## 2015-09-04 ENCOUNTER — Encounter (HOSPITAL_COMMUNITY): Payer: Self-pay | Admitting: Psychology

## 2015-09-04 NOTE — Progress Notes (Signed)
      PROGRESS NOTE  Patient:  Yesenia Boyd   DOB: 05/17/1956  MR Number: [id number]  [facility] [address] [place] [address] Dept: [phone]  Start: 3 PM  End: 4 PM  Provider/Observer:     Hershal CoriaJohn R Kanin Lia PSYD  Chief Complaint:      Chief Complaint  Patient presents with  . Anxiety  . Depression  . Stress  . Trauma    Reason For Service:     The patient initially presented with issues of depression, anxiety, and adjustment difficulties. She has had a lot of traumatic and stressful events in her life that she has difficulty coping with. The patient has numerous medical issues that can be found in her medical chart. Many these have some components to difficulty with sympathetic arousal and anxiety issues.  Interventions Strategy:  Cognitive/behavioral psychotherapeutic interventions  Participation Level:   Active  Participation Quality:  Appropriate      Behavioral Observation:  Well Groomed, Alert, and Appropriate.   Current Psychosocial Factors: The patient reports that she has continued to do better for the most part but family stressors continue to be quite problematic for her. The situation between her husband and herself has continued to be doing quite well but stressors associated with other family members continue.  Content of Session:   Reviewed current symptoms and continued work on therapeutic interventions for depression and anxiety.  Current Status:   the patient reports that her anxiety and depression significantly improved a she has been drawn better boundaries for people like her son, her husband, and her daughter..  Patient Progress:   Very good  Target Goals:   Target goals include reducing the intensity, duration, and frequency of significant episodes of depression and anxiety.  Last Reviewed:   08/31/2015  Goals Addressed Today:    Today we  worked on Producer, television/film/videobuilding coping skills and strategies are in issues of her depression and anxiety.  Impression/Diagnosis:   The patient is a long-standing history of recurrent depression and anxiety. Fibromyalgia and irritable bowel syndrome are also present. 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  Diagnosis:    Axis I:  Major depressive disorder, recurrent episode, moderate (HCC)  Anxiety      Axis II: No diagnosis     Enzo Treu R, PsyD 09/04/2015

## 2015-09-10 ENCOUNTER — Telehealth (HOSPITAL_COMMUNITY): Payer: Self-pay | Admitting: *Deleted

## 2015-09-10 NOTE — Telephone Encounter (Signed)
Pt called stating she would like to talk with Dr. Kieth Brightlyodenbough about her mother in law. Pt is aware that provider is out of the office until Monday 09-14-2015. Pt number is [phone].

## 2015-09-15 ENCOUNTER — Telehealth (HOSPITAL_COMMUNITY): Payer: Self-pay | Admitting: *Deleted
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
## 2015-09-17 ENCOUNTER — Encounter: Payer: Self-pay | Admitting: Obstetrics and Gynecology

## 2015-09-17 ENCOUNTER — Ambulatory Visit (INDEPENDENT_AMBULATORY_CARE_PROVIDER_SITE_OTHER): Payer: 59 | Admitting: Obstetrics and Gynecology

## 2015-09-17 VITALS — BP 118/60 | Ht 63.0 in | Wt 150.5 lb

## 2015-09-17 DIAGNOSIS — Z0000 Encounter for general adult medical examination without abnormal findings: Secondary | ICD-10-CM

## 2015-09-17 DIAGNOSIS — Z01419 Encounter for gynecological examination (general) (routine) without abnormal findings: Secondary | ICD-10-CM | POA: Diagnosis not present

## 2015-09-17 NOTE — Progress Notes (Signed)
Patient ID: Yesenia Boyd, female   DOB: 09/04/1956, 59 y.o.   MRN: [id number] Pt here today for annual exam. Pt states that she has a lot of irritation after shaving, pt would like to discuss something to help with that.

## 2015-09-17 NOTE — Progress Notes (Signed)
Assessment:  Annual Gyn Exam   Plan:  1. pap smear no done, s/p complete abdominal hysterectomy 2. return prn 3    Annual mammogram advised Subjective:  Yesenia Boyd is a 59 y.o. female No obstetric history on file. who presents for annual exam. No LMP recorded. Patient has had a hysterectomy. The patient has complaints today of irritation after shaving, pt would like to discuss something to help with that. Pt is currently on hormone therapy for her hysterectomy that she had in 1986.    The following portions of the patient's history were reviewed and updated as appropriate: allergies, current medications, past family history, past medical history, past social history, past surgical history and problem list. Past Medical History  Diagnosis Date  . IBS (irritable bowel syndrome)   . S/P endoscopy 2008, Aug 2010, Sept 2012    Dr. Linna Darner 2008: removal of impacted food bolus, Dr. Linna Darner 2010: moderate gastritis, Sept 2012 with SLF: path with mild gastritis, no definite stricture noted, dilation with Savary 16 mm  . S/P colonoscopy April 2009, Sept 2012    normal Dr. Linna Darner, internal hemorrhoids, repeat in Sept 2022  . Numbness and tingling in right hand     and whole left side of body-pt sts she has "spells to where she cannot walk or talk"  . Sleep apnea     no cpap used, was told to get oxygen  to use  . Bile reflux gastritis   . TIA (transient ischemic attack) 2012  . Asthma   . Peripheral neuropathy (HCC)   . Myasthenia gravis     right sided weakness occ.  . Fibromyalgia     sees Dr Philis Kendall at [facility]  . Myasthenia gravis in remission (HCC)   . Neuropathy (HCC)   . GERD (gastroesophageal reflux disease)   . Stroke [facility])     TIA 2010  . Shortness of breath dyspnea     pt believes it's related to asthma  . Depression     pt believes it's related to multiple sicknesses  . HIV infection (HCC)   . Magnesium deficiency     Past Surgical History  Procedure Laterality Date  .  Nissen fundoplication      twice  . Cholecystectomy    . Abdominal hysterectomy      complete per patient  . Esophageal surgery?      2008 [facility]  . Hand surgery      carpal tumnnel right and removal of cyst left  . Neck surgery      removal of "knot" from neck  . Bladder surgery      stretch bladder opening  . Savory dilation  07/18/2011    Surgeon:Sandi M Fields  . Esophageal manometry N/A 01/21/2013    Procedure: ESOPHAGEAL MANOMETRY (EM);  Surgeon: Valarie Merino, MD;  Location: [facility] ENDOSCOPY;  Service: General;  Laterality: N/A;  . Laparoscopic nissen fundoplication N/A 02/13/2013    Procedure: Redo LAPAROSCOPIC NISSEN FUNDOPLICATION;  Surgeon: Valarie Merino, MD;  Location: [facility] ORS;  Service: General;  Laterality: N/A;  Forgut explortation with partial  takedown nissan funliplication from 1994 for wrap torsion and persistant dysphagia  . Flexible sigmoidoscopy N/A 12/09/2014    SLF: Rectal pain most likely due to internal and external hemorroids  . Hemorrhoid banding N/A 12/09/2014    Procedure: HEMORRHOID BANDING;  Surgeon: West Bali, MD;  Location: [facility];  Service: Endoscopy;  Laterality: N/A;  . Colonoscopy  07/18/2011  SLF: 1. internal hemorrhoids  . Esophagogastroduodenoscopy  07/18/2011    SLF: 1. Moderate gastritis 2. Dysphagia most likely 2o large food bolus moving through her Nissen, which appears to be intact. Pt has poor denttition and NL BPE 2 years ago. 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  . Evaluation under anesthesia with hemorrhoidectomy N/A 03/27/2015    Procedure: EXAM UNDER ANESTHESIA WITH SPHINCTEROTOMY ;  Surgeon: Luretha MurphyMatthew Martin, MD;  Location: [facility] ORS;  Service: General;  Laterality: N/A;     Current outpatient prescriptions:  .  acetaminophen (TYLENOL) 650 MG CR tablet, Take 650 mg by mouth every 8 (eight) hours as needed for pain. , Disp: , Rfl:  .  albuterol (PROVENTIL HFA;VENTOLIN HFA) 108 (90 BASE) MCG/ACT  inhaler, Inhale 2 puffs into the lungs every 4 (four) hours as needed for shortness of breath., Disp: , Rfl:  .  Ascorbic Acid (VITAMELTS VITAMIN C PO), Take 1 tablet by mouth daily., Disp: , Rfl:  .  azelastine (ASTELIN) 137 MCG/SPRAY nasal spray, Place 1 spray into the nose 2 (two) times daily. , Disp: , Rfl:  .  azelastine (OPTIVAR) 0.05 % ophthalmic solution, , Disp: , Rfl:  .  beclomethasone (QVAR) 80 MCG/ACT inhaler, Inhale 2 puffs into the lungs daily., Disp: , Rfl:  .  CARAFATE 1 GM/10ML suspension, Take 1 g by mouth 4 (four) times daily. , Disp: , Rfl:  .  Cholecalciferol ([employer] VIT D 5000 HIGH-POTENCY) 5000 UNITS capsule, Take 5,000 Units by mouth every morning. , Disp: , Rfl:  .  estrogens, conjugated, (PREMARIN) 0.625 MG tablet, Take 0.625 mg by mouth daily. , Disp: , Rfl:  .  fluticasone (FLONASE) 50 MCG/ACT nasal spray, , Disp: , Rfl:  .  Guaifenesin 1200 MG TB12, Take 1 tablet by mouth 2 (two) times daily as needed (Congestion)., Disp: , Rfl:  .  hydrOXYzine (VISTARIL) 25 MG capsule, Take 25 mg by mouth at bedtime. , Disp: , Rfl:  .  L-Methylfolate-B6-B12 (METANX PO), Take 1 tablet by mouth at bedtime. , Disp: , Rfl:  .  levocetirizine (XYZAL) 5 MG tablet, Take 5 mg by mouth every evening. , Disp: , Rfl:  .  lubiprostone ([name]) 24 MCG capsule, Take 24 mcg by mouth 2 (two) times daily with a meal.  , Disp: , Rfl:  .  magnesium citrate solution, Take 296 mLs by mouth daily as needed. For blockage, Disp: , Rfl:  .  magnesium oxide (MAG-OX) 400 (241.3 MG) MG tablet, Take 400 mg by mouth daily., Disp: , Rfl:  .  Omega-3 Fatty Acids (OMEGA 3 PO), Take 2,100 mg by mouth 3 (three) times daily., Disp: , Rfl:  .  PRESCRIPTION MEDICATION, Apply 1 application topically every morning. LCD10% with Salicylic Acid 2%. Use as directed for skin and rashes., Disp: , Rfl:  .  Probiotic Product (PROBIOTIC DAILY PO), Take by mouth daily., Disp: , Rfl:  .  Propylene Glycol-Glycerin (SOOTHE OP), Apply 1  drop to eye 3 (three) times daily as needed (Dry eyes)., Disp: , Rfl:  .  ranitidine (ZANTAC) 300 MG tablet, Take 300 mg by mouth at bedtime., Disp: , Rfl:  .  simethicone (GAS-X) 80 MG chewable tablet, Chew 80 mg by mouth every 6 (six) hours as needed. For gas, Disp: , Rfl:  .  sodium chloride (OCEAN) 0.65 % SOLN nasal spray, Place 1 spray into both nostrils daily as needed for congestion., Disp: , Rfl:  .  tiZANidine (  ZANAFLEX) 4 MG tablet, Take 4 mg by mouth at bedtime. , Disp: , Rfl:  .  triamcinolone (KENALOG) 0.1 % cream, Apply 1 application topically 2 (two) times daily as needed (For hair). For skin., Disp: , Rfl:  .  triamcinolone lotion (KENALOG) 0.1 %, Apply 1 application topically 2 (two) times daily as needed (Skin rashes). , Disp: , Rfl:  .  vitamin E 1000 UNIT capsule, Take 1,000 Units by mouth daily., Disp: , Rfl:  .  benzonatate (TESSALON) 200 MG capsule, , Disp: , Rfl:  .  EPINEPHrine 0.3 mg/0.3 mL IJ SOAJ injection, Inject 0.3 mg into the muscle as needed (allergic reaction)., Disp: , Rfl:  .  hydrocortisone (ANUSOL-HC) 2.5 % rectal cream, Place 1 application rectally 2 (two) times daily as needed for hemorrhoids or itching., Disp: , Rfl:   Review of Systems Constitutional: negative Gastrointestinal: negative Genitourinary: negative  Objective:  BP 118/60 mmHg  Ht  (1.6 m)  Wt 150 lb 8 oz (68.266 kg)  BMI 26.67 kg/m2   BMI: Body mass index is 26.67 kg/(m^2).  General Appearance: Alert, appropriate appearance for age. No acute distress Breast Exam: No masses or nodes.No dimpling, nipple retraction or discharge. Gastrointestinal: Soft, non-tender, no masses or organomegaly Pelvic Exam: bladder is well supported. Total hysterectomy with removal of cervix. Good vaginal support anteriorly and posteriorly Rectovaginal: normal rectal, no masses and guaiac negative stool obtained. Tight anal sphincter Lymphatic Exam: Non-palpable nodes in neck, clavicular, axillary, or  inguinal regions  Skin: no rash or abnormalities Neurologic: Normal gait and speech, no tremor  Psychiatric: Alert and oriented, appropriate affect.  Urinalysis:Not done  Christin Bach. MD Pgr [phone] 2:00 PM  By signing my name below, I, Jarvis Morgan, attest that this documentation has been prepared under the direction and in the presence of Tilda Burrow, MD. Electronically Signed: Jarvis Morgan, ED Scribe. 09/17/2015. 2:04 PM.  I personally performed the services described in this documentation, which was SCRIBED in my presence. The recorded information has been reviewed and considered accurate. It has been edited as necessary during review. Tilda Burrow, MD

## 2015-10-05 ENCOUNTER — Ambulatory Visit (INDEPENDENT_AMBULATORY_CARE_PROVIDER_SITE_OTHER): Payer: 59 | Admitting: Psychology

## 2015-10-05 ENCOUNTER — Encounter (HOSPITAL_COMMUNITY): Payer: Self-pay | Admitting: Psychology

## 2015-10-05 DIAGNOSIS — F331 Major depressive disorder, recurrent, moderate: Secondary | ICD-10-CM

## 2015-10-05 DIAGNOSIS — F419 Anxiety disorder, unspecified: Secondary | ICD-10-CM | POA: Diagnosis not present
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
## 2015-10-08 DIAGNOSIS — I6521 Occlusion and stenosis of right carotid artery: Secondary | ICD-10-CM | POA: Diagnosis not present

## 2015-10-20 DIAGNOSIS — Z1231 Encounter for screening mammogram for malignant neoplasm of breast: Secondary | ICD-10-CM | POA: Diagnosis not present

## 2015-10-20 DIAGNOSIS — R921 Mammographic calcification found on diagnostic imaging of breast: Secondary | ICD-10-CM | POA: Diagnosis not present

## 2015-10-20 DIAGNOSIS — R928 Other abnormal and inconclusive findings on diagnostic imaging of breast: Secondary | ICD-10-CM | POA: Diagnosis not present

## 2015-11-04 ENCOUNTER — Ambulatory Visit (INDEPENDENT_AMBULATORY_CARE_PROVIDER_SITE_OTHER): Payer: 59 | Admitting: Psychology

## 2015-11-04 DIAGNOSIS — F331 Major depressive disorder, recurrent, moderate: Secondary | ICD-10-CM

## 2015-11-04 DIAGNOSIS — F419 Anxiety disorder, unspecified: Secondary | ICD-10-CM

## 2015-11-11 DIAGNOSIS — R928 Other abnormal and inconclusive findings on diagnostic imaging of breast: Secondary | ICD-10-CM | POA: Diagnosis not present

## 2015-12-02 ENCOUNTER — Ambulatory Visit (INDEPENDENT_AMBULATORY_CARE_PROVIDER_SITE_OTHER): Payer: 59 | Admitting: Psychology

## 2015-12-02 DIAGNOSIS — F419 Anxiety disorder, unspecified: Secondary | ICD-10-CM

## 2015-12-02 DIAGNOSIS — F331 Major depressive disorder, recurrent, moderate: Secondary | ICD-10-CM | POA: Diagnosis not present

## 2015-12-03 ENCOUNTER — Encounter (HOSPITAL_COMMUNITY): Payer: Self-pay | Admitting: Psychology
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
## 2015-12-04 DIAGNOSIS — L2089 Other atopic dermatitis: Secondary | ICD-10-CM | POA: Diagnosis not present

## 2015-12-04 DIAGNOSIS — K219 Gastro-esophageal reflux disease without esophagitis: Secondary | ICD-10-CM | POA: Diagnosis not present

## 2015-12-04 DIAGNOSIS — M545 Low back pain: Secondary | ICD-10-CM | POA: Diagnosis not present

## 2015-12-04 DIAGNOSIS — H00021 Hordeolum internum right upper eyelid: Secondary | ICD-10-CM | POA: Diagnosis not present

## 2015-12-23 ENCOUNTER — Ambulatory Visit (INDEPENDENT_AMBULATORY_CARE_PROVIDER_SITE_OTHER): Payer: 59 | Admitting: Psychology

## 2015-12-23 DIAGNOSIS — F419 Anxiety disorder, unspecified: Secondary | ICD-10-CM | POA: Diagnosis not present

## 2015-12-23 DIAGNOSIS — F331 Major depressive disorder, recurrent, moderate: Secondary | ICD-10-CM | POA: Diagnosis not present

## 2016-01-01 ENCOUNTER — Encounter (HOSPITAL_COMMUNITY): Payer: Self-pay | Admitting: Psychology
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
## 2016-01-04 DIAGNOSIS — Z6825 Body mass index (BMI) 25.0-25.9, adult: Secondary | ICD-10-CM | POA: Diagnosis not present

## 2016-01-04 DIAGNOSIS — L600 Ingrowing nail: Secondary | ICD-10-CM | POA: Diagnosis not present

## 2016-01-22 ENCOUNTER — Ambulatory Visit (INDEPENDENT_AMBULATORY_CARE_PROVIDER_SITE_OTHER): Payer: 59 | Admitting: Psychology

## 2016-01-22 DIAGNOSIS — F419 Anxiety disorder, unspecified: Secondary | ICD-10-CM | POA: Diagnosis not present

## 2016-01-22 DIAGNOSIS — F331 Major depressive disorder, recurrent, moderate: Secondary | ICD-10-CM

## 2016-01-26 DIAGNOSIS — R040 Epistaxis: Secondary | ICD-10-CM | POA: Diagnosis not present

## 2016-01-26 DIAGNOSIS — Z6825 Body mass index (BMI) 25.0-25.9, adult: Secondary | ICD-10-CM | POA: Diagnosis not present

## 2016-01-26 DIAGNOSIS — J018 Other acute sinusitis: Secondary | ICD-10-CM | POA: Diagnosis not present

## 2016-01-27 ENCOUNTER — Encounter: Payer: Self-pay | Admitting: Gastroenterology

## 2016-02-14 DIAGNOSIS — J45909 Unspecified asthma, uncomplicated: Secondary | ICD-10-CM | POA: Diagnosis not present

## 2016-02-14 DIAGNOSIS — Z91040 Latex allergy status: Secondary | ICD-10-CM | POA: Diagnosis not present

## 2016-02-14 DIAGNOSIS — K5909 Other constipation: Secondary | ICD-10-CM | POA: Diagnosis not present

## 2016-02-14 DIAGNOSIS — Z888 Allergy status to other drugs, medicaments and biological substances status: Secondary | ICD-10-CM | POA: Diagnosis not present

## 2016-02-14 DIAGNOSIS — R079 Chest pain, unspecified: Secondary | ICD-10-CM | POA: Diagnosis not present

## 2016-02-14 DIAGNOSIS — Z79899 Other long term (current) drug therapy: Secondary | ICD-10-CM | POA: Diagnosis not present

## 2016-02-14 DIAGNOSIS — Z881 Allergy status to other antibiotic agents status: Secondary | ICD-10-CM | POA: Diagnosis not present

## 2016-02-14 DIAGNOSIS — Z885 Allergy status to narcotic agent status: Secondary | ICD-10-CM | POA: Diagnosis not present

## 2016-02-14 DIAGNOSIS — J310 Chronic rhinitis: Secondary | ICD-10-CM | POA: Diagnosis not present

## 2016-02-14 DIAGNOSIS — Z880 Allergy status to penicillin: Secondary | ICD-10-CM | POA: Diagnosis not present

## 2016-02-14 DIAGNOSIS — K449 Diaphragmatic hernia without obstruction or gangrene: Secondary | ICD-10-CM | POA: Diagnosis not present

## 2016-02-14 DIAGNOSIS — M797 Fibromyalgia: Secondary | ICD-10-CM | POA: Diagnosis not present

## 2016-02-18 ENCOUNTER — Ambulatory Visit (INDEPENDENT_AMBULATORY_CARE_PROVIDER_SITE_OTHER): Payer: 59 | Admitting: Psychology

## 2016-02-18 DIAGNOSIS — F331 Major depressive disorder, recurrent, moderate: Secondary | ICD-10-CM | POA: Diagnosis not present

## 2016-02-18 DIAGNOSIS — F419 Anxiety disorder, unspecified: Secondary | ICD-10-CM | POA: Diagnosis not present

## 2016-02-22 ENCOUNTER — Ambulatory Visit (HOSPITAL_COMMUNITY): Payer: Self-pay | Admitting: Psychology

## 2016-02-22 DIAGNOSIS — Z6825 Body mass index (BMI) 25.0-25.9, adult: Secondary | ICD-10-CM | POA: Diagnosis not present

## 2016-02-22 DIAGNOSIS — I208 Other forms of angina pectoris: Secondary | ICD-10-CM | POA: Diagnosis not present

## 2016-03-09 ENCOUNTER — Encounter: Payer: Self-pay | Admitting: Gastroenterology

## 2016-03-09 ENCOUNTER — Ambulatory Visit (INDEPENDENT_AMBULATORY_CARE_PROVIDER_SITE_OTHER): Payer: 59 | Admitting: Gastroenterology

## 2016-03-09 VITALS — BP 106/67 | HR 71 | Temp 97.8°F | Ht 63.0 in | Wt 142.6 lb

## 2016-03-09 DIAGNOSIS — K219 Gastro-esophageal reflux disease without esophagitis: Secondary | ICD-10-CM

## 2016-03-09 DIAGNOSIS — K582 Mixed irritable bowel syndrome: Secondary | ICD-10-CM | POA: Diagnosis not present

## 2016-03-09 DIAGNOSIS — K6289 Other specified diseases of anus and rectum: Secondary | ICD-10-CM

## 2016-03-09 DIAGNOSIS — R1310 Dysphagia, unspecified: Secondary | ICD-10-CM

## 2016-03-09 NOTE — Progress Notes (Signed)
Subjective:    Patient ID: Yesenia Boyd, female    DOB: 1956/09/30, 60 y.o.   MRN: [id number] Toma Deiters, MD   HPI Concerned stools are loose/solid/small balls. Still having some belly pain. PINS IN STOMACH. NUTS COME OUT WHOLE. 1/4 CITRATE EVERY NIGHT. KNOCK HARD PART OUT THEN SOFT STOOLS. GETS COLD AND THEN HAS PAIN/CRAMPS IN HER FEET.  SHOWED ME A PAPER WITH ? ANGINA. PRESSURE IN EYES NOW. NAUSEA FOR PAST WEEK AND REFLUXING. FEELS LIKE THINGS HING UP AND MAKE SITS WAY BACK UP AND FEEL SORE IN THERE. TAKES [name] TWO AT ONE TIME. PT DENIES FEVER, CHILLS, HEMATOCHEZIA,  vomiting, melena, CHEST PAIN, SHORTNESS OF BREATH,  CHANGE IN BOWEL IN HABITS, OR heartburn or indigestion. TROUBLE WITH BLOATING. EATING ORE VEGETABLES.  Past Medical History  Diagnosis Date  . IBS (irritable bowel syndrome)   .        .        . Numbness and tingling in right hand     and whole left side of body-pt sts she has "spells to where she cannot walk or talk"  . Sleep apnea     no cpap used, was told to get oxygen  to use  . Bile reflux gastritis   . TIA (transient ischemic attack) 2012  . Asthma   . Peripheral neuropathy (HCC)   . Myasthenia gravis     right sided weakness occ.  . Fibromyalgia     sees Dr Enrigue Catena at [facility]  . Myasthenia gravis in remission (HCC)   . Neuropathy (HCC)   . GERD (gastroesophageal reflux disease)   . Stroke [facility])     TIA 2010  . Shortness of breath dyspnea     pt believes it's related to asthma  . Depression     pt believes it's related to multiple sicknesses      . Magnesium deficiency    Past Surgical History  Procedure Laterality Date  . Nissen fundoplication      twice  . Cholecystectomy    . Abdominal hysterectomy      complete per patient  . Esophageal surgery?      2008 [facility]  . Hand surgery      carpal tumnnel right and removal of cyst left  . Neck surgery      removal of "knot" from neck  . Bladder surgery      stretch bladder opening  .  Savory dilation  07/18/2011    Surgeon:Miyeko Mahlum M Blue Ruggerio  . Esophageal manometry N/A 01/21/2013    Procedure: ESOPHAGEAL MANOMETRY (EM);  Surgeon: Valarie Merino, MD;  Location: [facility];  Service: General;  Laterality: N/A;  . Laparoscopic nissen fundoplication N/A 02/13/2013    Procedure: Redo LAPAROSCOPIC NISSEN FUNDOPLICATION;  Surgeon: Valarie Merino, MD;  Location: [facility] ORS;  Service: General;  Laterality: N/A;  Forgut explortation with partial  takedown nissan funliplication from 1994 for wrap torsion and persistant dysphagia  . Flexible sigmoidoscopy N/A 12/09/2014    SLF: Rectal pain most likely due to internal and external hemorroids  . Hemorrhoid banding N/A 12/09/2014    Procedure: HEMORRHOID BANDING;  Surgeon: West Bali, MD;  Location: [facility];  Service: Endoscopy;  Laterality: N/A;  . Colonoscopy  07/18/2011    SLF: 1. internal hemorrhoids  . Esophagogastroduodenoscopy  07/18/2011    SLF: 1. Moderate gastritis 2. Dysphagia most likely 2o large food bolus moving through her Nissen, which appears to be intact. Pt has  poor denttition and NL BPE 2 years ago. 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  . Evaluation under anesthesia with hemorrhoidectomy N/A 03/27/2015    Procedure: EXAM UNDER ANESTHESIA WITH SPHINCTEROTOMY ;  Surgeon: Luretha Murphy, MD;  Location: [facility];  Service: General;  Laterality: N/A;    Allergies  Allergen Reactions  . Avelox [Moxifloxacin Hcl In Nacl] Anaphylaxis and Itching  . Linzess [Linaclotide] Swelling    Swelling, bloating, nausea.Med used for "IBS"  . Adhesive [Tape] Other (See Comments)    Red and itchy under tape  . Carbamazepine Other (See Comments)    Memory loss   . Cephalexin Hives and Itching  . Codeine Hives and Itching  . Cymbalta [Duloxetine Hcl] Other (See Comments)    Hair loss, nervousness, severe constipation, red blotches  . Darvon [Propoxyphene] Hives  . Dicyclomine Itching and Other (See  Comments)    Nervousness  . Latex Itching  . Macrolides And Ketolides Swelling  . Meperidine Hcl Itching and Other (See Comments)    Nervousness  . Metronidazole Hives and Other (See Comments)    Red blotches.  . Morphine Itching and Other (See Comments)    Nervousness  . Penicillins Hives  . Pepcid [Famotidine] Nausea And Vomiting  . Prednisone Hives, Swelling and Other (See Comments)    Red blotches - Can take with Benadryl  . Propofol     Depression   . Protonix [Pantoprazole] Swelling  . Sulfonamide Derivatives Hives  . Tetracycline Other (See Comments)    Reaction unknown  . Tramadol Hcl Hives and Other (See Comments)    Nervousness   Current Outpatient Prescriptions  Medication Sig Dispense Refill  . acetaminophen (TYLENOL) 650 MG CR tablet Take 650 mg by mouth every 8 (eight) hours as needed for pain.     Marland Kitchen albuterol (PROVENTIL HFA;VENTOLIN HFA) 108 (90 BASE) MCG/ACT inhaler Inhale 2 puffs into the lungs every 4 (four) hours as needed for shortness of breath.    . Ascorbic Acid (VITAMELTS VITAMIN C PO) Take 1 tablet by mouth daily.    Marland Kitchen azelastine (ASTELIN) 137 MCG/SPRAY nasal spray Place 1 spray into the nose 2 (two) times daily.     Marland Kitchen azelastine (OPTIVAR) 0.05 % ophthalmic solution     . beclomethasone (QVAR) 80 MCG/ACT inhaler Inhale 2 puffs into the lungs daily.    . benzonatate (TESSALON) 200 MG capsule     . CARAFATE 1 GM/10ML suspension Take 1 g by mouth 4 (four) times daily.     . Cholecalciferol ([employer] VIT D 5000 HIGH-POTENCY) 5000 UNITS capsule Take 5,000 Units by mouth every morning.     Marland Kitchen EPINEPHrine 0.3 mg/0.3 mL IJ SOAJ injection Inject 0.3 mg into the muscle as needed (allergic reaction).    Marland Kitchen estrogens, conjugated, (PREMARIN) 0.625 MG tablet Take 0.625 mg by mouth daily.     . fluticasone (FLONASE) 50 MCG/ACT nasal spray     . Guaifenesin 1200 MG TB12 Take 1 tablet by mouth 2 (two) times daily as needed (Congestion).    .      . hydrOXYzine (VISTARIL) 25 MG  capsule Take 25 mg by mouth at bedtime.     Marland Kitchen L-Methylfolate-B6-B12 (METANX PO) Take 1 tablet by mouth at bedtime.     Marland Kitchen levocetirizine (XYZAL) 5 MG tablet Take 5 mg by mouth every evening.     . lubiprostone ([name]) 24 MCG capsule Take 24 mcg by mouth 2 (two) times daily with a  meal.      . magnesium citrate solution Take 296 mLs by mouth daily as needed. For blockage    . magnesium oxide (MAG-OX) 400 (241.3 MG) MG tablet Take 400 mg by mouth daily.    . nitroGLYCERIN (NITROSTAT) 0.4 MG SL tablet Place 0.4 mg under the tongue every 5 (five) minutes as needed for chest pain.    . Omega-3 Fatty Acids (OMEGA 3 PO) Take 2,100 mg by mouth 3 (three) times daily.    Marland Kitchen. PRESCRIPTION MEDICATION Apply 1 application topically every morning. LCD10% with Salicylic Acid 2%. Use as directed for skin and rashes.    . Probiotic Product (PROBIOTIC DAILY PO) Take by mouth daily.    Marland Kitchen. Propylene Glycol-Glycerin (SOOTHE OP) Apply 1 drop to eye 3 (three) times daily as needed (Dry eyes).    . ranitidine (ZANTAC) 300 MG tablet Take 300 mg by mouth at bedtime.    . simethicone (GAS-X) 80 MG chewable tablet Chew 80 mg by mouth every 6 (six) hours as needed. For gas    . sodium chloride (OCEAN) 0.65 % SOLN nasal spray Place 1 spray into both nostrils daily as needed for congestion.    Marland Kitchen. tiZANidine (ZANAFLEX) 4 MG tablet Take 4 mg by mouth at bedtime.     . triamcinolone (KENALOG) 0.1 % cream Apply 1 application topically 2 (two) times daily as needed (For hair). For skin.    Marland Kitchen. triamcinolone lotion (KENALOG) 0.1 % Apply 1 application topically 2 (two) times daily as needed (Skin rashes).     . vitamin E 1000 UNIT capsule Take 1,000 Units by mouth daily.     Review of Systems PER HPI OTHERWISE ALL SYSTEMS ARE NEGATIVE.    Objective:   Physical Exam  Constitutional: She is oriented to person, place, and time. She appears well-developed and well-nourished. No distress.  HENT:  Head: Normocephalic and atraumatic.    Mouth/Throat: Oropharynx is clear and moist. No oropharyngeal exudate.  Eyes: Pupils are equal, round, and reactive to light. No scleral icterus.  Neck: Normal range of motion. Neck supple.  Cardiovascular: Normal rate, regular rhythm and normal heart sounds.   Pulmonary/Chest: Effort normal and breath sounds normal. No respiratory distress.  Abdominal: Soft. Bowel sounds are normal. She exhibits no distension. There is tenderness. There is no rebound and no guarding.  Musculoskeletal: She exhibits no edema.  Lymphadenopathy:    She has no cervical adenopathy.  Neurological: She is alert and oriented to person, place, and time.  NO  NEW FOCAL DEFICITS  Psychiatric: She has a normal mood and affect.  Vitals reviewed.     Assessment & Plan:

## 2016-03-09 NOTE — Patient Instructions (Signed)
AVOID ITEMS THAT CAUSE BLOATING & GAS. SEE INFO BELOW.  IF YOU CONSUME DAIRY, use LACTASE 3 PILLS WITH MEALS UP TO THREE TIMES A DAY.  CONTINUE [name] AND CITRATE.  FOLLOW UP IN 6 MOS.   BLOATING AND GAS PREVENTION  Although gas may be uncomfortable and embarrassing, it is not life-threatening. Understanding causes, ways to reduce symptoms, and treatment will help most people find some relief.  Points to remember  . Everyone has gas in the digestive tract. Marland Kitchen. People often believe normal passage of gas to be excessive. . Gas comes from two main sources: swallowed air and normal breakdown of certain foods by harmless bacteria naturally present in the large intestine. . Many foods with carbohydrates can cause gas. Fats and proteins cause little gas. . Foods that may cause gas include o beans  o vegetables, such as broccoli, cabbage, brussels sprouts, onions, artichokes, and asparagus  o fruits, such as pears, apples, and peaches  o whole grains, such as whole wheat and bran  o soft drinks and fruit drinks  o milk and milk products, such as cheese and ice cream, and packaged foods prepared with lactose, such as bread, cereal, and salad dressing  o foods containing sorbitol, such as dietetic foods and sugar free candies and gums.  . The most common symptoms of gas are belching, flatulence, bloating, and abdominal pain. However, some of these symptoms are often caused by an intestinal disorder, such as irritable bowel syndrome, rather than too much gas.  . The most common ways to reduce the discomfort of gas are changing diet, taking nonprescription medicines, and reducing the amount of air swallowed.  . Digestive enzymes, such as lactase supplements, actually help digest carbohydrates and may allow people to eat foods that normally cause gas.

## 2016-03-09 NOTE — Assessment & Plan Note (Signed)
SYMPTOMS FAIRLY WELL CONTROLLED.  ZANTAC FOR GERD. CONTINUE TO MONITOR SYMPTOMS.

## 2016-03-09 NOTE — Progress Notes (Signed)
ON RECALL  °

## 2016-03-09 NOTE — Assessment & Plan Note (Signed)
SYMPTOMS CONTROLLED/RESOLVED.  CONTINUE TO MONITOR SYMPTOMS. 

## 2016-03-09 NOTE — Assessment & Plan Note (Signed)
SYMPTOMS FAIRLY WELL CONTROLLED. HAVING LOOSE STOOLS WITH CITRATE AND [name] BUT FEELS SHE NEEDS TO USE CITRATE TO PUSH HARD STOOLS OUT.  DRINK WATER TO KEEP YOUR URINE LIGHT YELLOW. EAT FIBER. AVOID ITEMS THAT CAUSE BLOATING & GAS.  HANDOUT GIVEN. CONTINUE [name] AND CITRATE. FOLLOW UP IN 6 MOS.

## 2016-03-10 NOTE — Progress Notes (Signed)
cc'ed to pcp °

## 2016-03-11 DIAGNOSIS — B301 Conjunctivitis due to adenovirus: Secondary | ICD-10-CM | POA: Diagnosis not present

## 2016-03-11 DIAGNOSIS — Z6825 Body mass index (BMI) 25.0-25.9, adult: Secondary | ICD-10-CM | POA: Diagnosis not present

## 2016-03-14 ENCOUNTER — Ambulatory Visit (INDEPENDENT_AMBULATORY_CARE_PROVIDER_SITE_OTHER): Payer: 59 | Admitting: Psychology

## 2016-03-14 DIAGNOSIS — F331 Major depressive disorder, recurrent, moderate: Secondary | ICD-10-CM | POA: Diagnosis not present

## 2016-03-14 DIAGNOSIS — F419 Anxiety disorder, unspecified: Secondary | ICD-10-CM

## 2016-03-27 DIAGNOSIS — N3001 Acute cystitis with hematuria: Secondary | ICD-10-CM | POA: Diagnosis not present

## 2016-03-27 DIAGNOSIS — F329 Major depressive disorder, single episode, unspecified: Secondary | ICD-10-CM | POA: Diagnosis not present

## 2016-03-27 DIAGNOSIS — Z9049 Acquired absence of other specified parts of digestive tract: Secondary | ICD-10-CM | POA: Diagnosis not present

## 2016-03-27 DIAGNOSIS — M797 Fibromyalgia: Secondary | ICD-10-CM | POA: Diagnosis not present

## 2016-03-27 DIAGNOSIS — K589 Irritable bowel syndrome without diarrhea: Secondary | ICD-10-CM | POA: Diagnosis not present

## 2016-03-27 DIAGNOSIS — E559 Vitamin D deficiency, unspecified: Secondary | ICD-10-CM | POA: Diagnosis not present

## 2016-03-27 DIAGNOSIS — K6289 Other specified diseases of anus and rectum: Secondary | ICD-10-CM | POA: Diagnosis not present

## 2016-03-27 DIAGNOSIS — J45909 Unspecified asthma, uncomplicated: Secondary | ICD-10-CM | POA: Diagnosis not present

## 2016-03-27 DIAGNOSIS — K219 Gastro-esophageal reflux disease without esophagitis: Secondary | ICD-10-CM | POA: Diagnosis not present

## 2016-03-27 DIAGNOSIS — Z8673 Personal history of transient ischemic attack (TIA), and cerebral infarction without residual deficits: Secondary | ICD-10-CM | POA: Diagnosis not present

## 2016-03-27 DIAGNOSIS — Z79899 Other long term (current) drug therapy: Secondary | ICD-10-CM | POA: Diagnosis not present

## 2016-04-13 ENCOUNTER — Encounter (HOSPITAL_COMMUNITY): Payer: Self-pay | Admitting: Psychology

## 2016-04-13 ENCOUNTER — Ambulatory Visit (INDEPENDENT_AMBULATORY_CARE_PROVIDER_SITE_OTHER): Payer: 59 | Admitting: Psychology

## 2016-04-13 DIAGNOSIS — F331 Major depressive disorder, recurrent, moderate: Secondary | ICD-10-CM | POA: Diagnosis not present

## 2016-04-13 DIAGNOSIS — F419 Anxiety disorder, unspecified: Secondary | ICD-10-CM
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
## 2016-04-14 DIAGNOSIS — B301 Conjunctivitis due to adenovirus: Secondary | ICD-10-CM | POA: Diagnosis not present

## 2016-04-14 DIAGNOSIS — Z6825 Body mass index (BMI) 25.0-25.9, adult: Secondary | ICD-10-CM | POA: Diagnosis not present

## 2016-04-14 DIAGNOSIS — F458 Other somatoform disorders: Secondary | ICD-10-CM | POA: Diagnosis not present

## 2016-04-15 ENCOUNTER — Ambulatory Visit (HOSPITAL_COMMUNITY): Payer: Self-pay | Admitting: Psychology

## 2016-05-08 DIAGNOSIS — T7840XA Allergy, unspecified, initial encounter: Secondary | ICD-10-CM | POA: Diagnosis not present

## 2016-05-08 DIAGNOSIS — F329 Major depressive disorder, single episode, unspecified: Secondary | ICD-10-CM | POA: Diagnosis not present

## 2016-05-08 DIAGNOSIS — Z8673 Personal history of transient ischemic attack (TIA), and cerebral infarction without residual deficits: Secondary | ICD-10-CM | POA: Diagnosis not present

## 2016-05-08 DIAGNOSIS — M797 Fibromyalgia: Secondary | ICD-10-CM | POA: Diagnosis not present

## 2016-05-08 DIAGNOSIS — Z79899 Other long term (current) drug therapy: Secondary | ICD-10-CM | POA: Diagnosis not present

## 2016-05-08 DIAGNOSIS — J45909 Unspecified asthma, uncomplicated: Secondary | ICD-10-CM | POA: Diagnosis not present

## 2016-05-08 DIAGNOSIS — K219 Gastro-esophageal reflux disease without esophagitis: Secondary | ICD-10-CM | POA: Diagnosis not present

## 2016-05-09 DIAGNOSIS — Z6824 Body mass index (BMI) 24.0-24.9, adult: Secondary | ICD-10-CM | POA: Diagnosis not present

## 2016-05-09 DIAGNOSIS — J3089 Other allergic rhinitis: Secondary | ICD-10-CM | POA: Diagnosis not present

## 2016-05-19 ENCOUNTER — Ambulatory Visit (HOSPITAL_COMMUNITY): Payer: Self-pay | Admitting: Psychology

## 2016-05-20 DIAGNOSIS — Z6825 Body mass index (BMI) 25.0-25.9, adult: Secondary | ICD-10-CM | POA: Diagnosis not present

## 2016-05-20 DIAGNOSIS — H8113 Benign paroxysmal vertigo, bilateral: Secondary | ICD-10-CM | POA: Diagnosis not present

## 2016-05-23 ENCOUNTER — Ambulatory Visit (INDEPENDENT_AMBULATORY_CARE_PROVIDER_SITE_OTHER): Payer: 59 | Admitting: Otolaryngology

## 2016-05-23 DIAGNOSIS — J310 Chronic rhinitis: Secondary | ICD-10-CM | POA: Diagnosis not present

## 2016-05-23 DIAGNOSIS — H9313 Tinnitus, bilateral: Secondary | ICD-10-CM | POA: Diagnosis not present

## 2016-05-23 DIAGNOSIS — J343 Hypertrophy of nasal turbinates: Secondary | ICD-10-CM | POA: Diagnosis not present

## 2016-05-23 DIAGNOSIS — H903 Sensorineural hearing loss, bilateral: Secondary | ICD-10-CM

## 2016-05-24 ENCOUNTER — Other Ambulatory Visit (INDEPENDENT_AMBULATORY_CARE_PROVIDER_SITE_OTHER): Payer: Self-pay | Admitting: Otolaryngology

## 2016-05-24 DIAGNOSIS — J320 Chronic maxillary sinusitis: Secondary | ICD-10-CM

## 2016-05-24 DIAGNOSIS — J0101 Acute recurrent maxillary sinusitis: Secondary | ICD-10-CM

## 2016-05-25 ENCOUNTER — Encounter (HOSPITAL_COMMUNITY): Payer: Self-pay | Admitting: Psychology
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
## 2016-05-28 DIAGNOSIS — E559 Vitamin D deficiency, unspecified: Secondary | ICD-10-CM | POA: Diagnosis not present

## 2016-05-28 DIAGNOSIS — Z79899 Other long term (current) drug therapy: Secondary | ICD-10-CM | POA: Diagnosis not present

## 2016-05-28 DIAGNOSIS — T7840XA Allergy, unspecified, initial encounter: Secondary | ICD-10-CM | POA: Diagnosis not present

## 2016-05-28 DIAGNOSIS — Z885 Allergy status to narcotic agent status: Secondary | ICD-10-CM | POA: Diagnosis not present

## 2016-05-28 DIAGNOSIS — Z880 Allergy status to penicillin: Secondary | ICD-10-CM | POA: Diagnosis not present

## 2016-05-28 DIAGNOSIS — Z8673 Personal history of transient ischemic attack (TIA), and cerebral infarction without residual deficits: Secondary | ICD-10-CM | POA: Diagnosis not present

## 2016-05-28 DIAGNOSIS — Z882 Allergy status to sulfonamides status: Secondary | ICD-10-CM | POA: Diagnosis not present

## 2016-05-28 DIAGNOSIS — Z888 Allergy status to other drugs, medicaments and biological substances status: Secondary | ICD-10-CM | POA: Diagnosis not present

## 2016-05-28 DIAGNOSIS — K219 Gastro-esophageal reflux disease without esophagitis: Secondary | ICD-10-CM | POA: Diagnosis not present

## 2016-05-28 DIAGNOSIS — L03012 Cellulitis of left finger: Secondary | ICD-10-CM | POA: Diagnosis not present

## 2016-05-28 DIAGNOSIS — Z881 Allergy status to other antibiotic agents status: Secondary | ICD-10-CM | POA: Diagnosis not present

## 2016-05-28 DIAGNOSIS — E876 Hypokalemia: Secondary | ICD-10-CM | POA: Diagnosis not present

## 2016-05-30 DIAGNOSIS — R531 Weakness: Secondary | ICD-10-CM | POA: Diagnosis not present

## 2016-05-30 DIAGNOSIS — Z885 Allergy status to narcotic agent status: Secondary | ICD-10-CM | POA: Diagnosis not present

## 2016-05-30 DIAGNOSIS — R220 Localized swelling, mass and lump, head: Secondary | ICD-10-CM | POA: Diagnosis not present

## 2016-05-30 DIAGNOSIS — J309 Allergic rhinitis, unspecified: Secondary | ICD-10-CM | POA: Diagnosis not present

## 2016-05-30 DIAGNOSIS — Z884 Allergy status to anesthetic agent status: Secondary | ICD-10-CM | POA: Diagnosis not present

## 2016-05-30 DIAGNOSIS — J45909 Unspecified asthma, uncomplicated: Secondary | ICD-10-CM | POA: Diagnosis not present

## 2016-05-30 DIAGNOSIS — Z881 Allergy status to other antibiotic agents status: Secondary | ICD-10-CM | POA: Diagnosis not present

## 2016-05-30 DIAGNOSIS — Z8673 Personal history of transient ischemic attack (TIA), and cerebral infarction without residual deficits: Secondary | ICD-10-CM | POA: Diagnosis not present

## 2016-05-30 DIAGNOSIS — D649 Anemia, unspecified: Secondary | ICD-10-CM | POA: Diagnosis not present

## 2016-05-30 DIAGNOSIS — K5900 Constipation, unspecified: Secondary | ICD-10-CM | POA: Diagnosis not present

## 2016-05-30 DIAGNOSIS — Z91040 Latex allergy status: Secondary | ICD-10-CM | POA: Diagnosis not present

## 2016-05-30 DIAGNOSIS — K219 Gastro-esophageal reflux disease without esophagitis: Secondary | ICD-10-CM | POA: Diagnosis not present

## 2016-05-30 DIAGNOSIS — Z888 Allergy status to other drugs, medicaments and biological substances status: Secondary | ICD-10-CM | POA: Diagnosis not present

## 2016-05-30 DIAGNOSIS — Z79899 Other long term (current) drug therapy: Secondary | ICD-10-CM | POA: Diagnosis not present

## 2016-05-30 DIAGNOSIS — Z880 Allergy status to penicillin: Secondary | ICD-10-CM | POA: Diagnosis not present

## 2016-05-30 DIAGNOSIS — R55 Syncope and collapse: Secondary | ICD-10-CM | POA: Diagnosis not present

## 2016-05-31 ENCOUNTER — Ambulatory Visit (HOSPITAL_COMMUNITY): Admission: RE | Admit: 2016-05-31 | Payer: 59 | Source: Ambulatory Visit

## 2016-06-06 ENCOUNTER — Ambulatory Visit (HOSPITAL_COMMUNITY)
Admission: RE | Admit: 2016-06-06 | Discharge: 2016-06-06 | Disposition: A | Discharge status: Home / Self Care | Payer: 59 | Source: Ambulatory Visit | Attending: Otolaryngology | Admitting: Otolaryngology

## 2016-06-06 DIAGNOSIS — J320 Chronic maxillary sinusitis: Secondary | ICD-10-CM | POA: Diagnosis not present

## 2016-06-06 DIAGNOSIS — R938 Abnormal findings on diagnostic imaging of other specified body structures: Secondary | ICD-10-CM | POA: Diagnosis not present

## 2016-06-09 DIAGNOSIS — R55 Syncope and collapse: Secondary | ICD-10-CM | POA: Diagnosis not present

## 2016-06-09 DIAGNOSIS — Z6824 Body mass index (BMI) 24.0-24.9, adult: Secondary | ICD-10-CM | POA: Diagnosis not present

## 2016-06-13 ENCOUNTER — Ambulatory Visit (INDEPENDENT_AMBULATORY_CARE_PROVIDER_SITE_OTHER): Payer: 59 | Admitting: Otolaryngology

## 2016-06-13 DIAGNOSIS — J342 Deviated nasal septum: Secondary | ICD-10-CM | POA: Diagnosis not present

## 2016-06-13 DIAGNOSIS — J310 Chronic rhinitis: Secondary | ICD-10-CM

## 2016-06-13 DIAGNOSIS — J343 Hypertrophy of nasal turbinates: Secondary | ICD-10-CM

## 2016-06-14 ENCOUNTER — Other Ambulatory Visit: Payer: Self-pay | Admitting: Otolaryngology

## 2016-06-16 ENCOUNTER — Ambulatory Visit (HOSPITAL_COMMUNITY): Payer: 59 | Admitting: Psychology

## 2016-06-20 ENCOUNTER — Telehealth (HOSPITAL_COMMUNITY): Payer: Self-pay | Admitting: *Deleted

## 2016-06-20 NOTE — Telephone Encounter (Signed)
returned phone call to patient, no answer, left voice message. 

## 2016-07-07 ENCOUNTER — Encounter: Payer: Self-pay | Admitting: Gastroenterology

## 2016-07-13 DIAGNOSIS — Z23 Encounter for immunization: Secondary | ICD-10-CM | POA: Diagnosis not present

## 2016-07-14 ENCOUNTER — Encounter (HOSPITAL_BASED_OUTPATIENT_CLINIC_OR_DEPARTMENT_OTHER): Payer: Self-pay | Admitting: *Deleted

## 2016-07-14 NOTE — Progress Notes (Signed)
PMH reviewed by Dr Freida BusmanAllen MDA, pt's surgery with Dr. Suszanne Connerseoh to be moved to main OR. Office notified.

## 2016-07-18 ENCOUNTER — Ambulatory Visit (INDEPENDENT_AMBULATORY_CARE_PROVIDER_SITE_OTHER): Payer: 59 | Admitting: Psychology

## 2016-07-18 DIAGNOSIS — F419 Anxiety disorder, unspecified: Secondary | ICD-10-CM

## 2016-07-18 DIAGNOSIS — F331 Major depressive disorder, recurrent, moderate: Secondary | ICD-10-CM

## 2016-07-19 ENCOUNTER — Encounter (HOSPITAL_COMMUNITY): Payer: Self-pay | Admitting: Psychology
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
## 2016-07-20 ENCOUNTER — Encounter (HOSPITAL_COMMUNITY): Payer: Self-pay | Admitting: Psychology
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
## 2016-07-25 ENCOUNTER — Ambulatory Visit (HOSPITAL_COMMUNITY): Payer: Self-pay | Admitting: Psychology

## 2016-07-28 ENCOUNTER — Encounter (HOSPITAL_COMMUNITY): Payer: Self-pay

## 2016-07-28 ENCOUNTER — Encounter (HOSPITAL_COMMUNITY)
Admission: RE | Admit: 2016-07-28 | Discharge: 2016-07-28 | Disposition: A | Discharge status: Home / Self Care | Payer: 59 | Source: Ambulatory Visit | Attending: Otolaryngology | Admitting: Otolaryngology

## 2016-07-28 DIAGNOSIS — G7000 Myasthenia gravis without (acute) exacerbation: Secondary | ICD-10-CM | POA: Diagnosis not present

## 2016-07-28 DIAGNOSIS — J45909 Unspecified asthma, uncomplicated: Secondary | ICD-10-CM | POA: Diagnosis not present

## 2016-07-28 DIAGNOSIS — R569 Unspecified convulsions: Secondary | ICD-10-CM | POA: Diagnosis not present

## 2016-07-28 DIAGNOSIS — G4733 Obstructive sleep apnea (adult) (pediatric): Secondary | ICD-10-CM | POA: Insufficient documentation

## 2016-07-28 DIAGNOSIS — Z01812 Encounter for preprocedural laboratory examination: Secondary | ICD-10-CM | POA: Insufficient documentation

## 2016-07-28 DIAGNOSIS — J342 Deviated nasal septum: Secondary | ICD-10-CM | POA: Insufficient documentation

## 2016-07-28 DIAGNOSIS — K219 Gastro-esophageal reflux disease without esophagitis: Secondary | ICD-10-CM | POA: Insufficient documentation

## 2016-07-28 HISTORY — DX: Personal history of other diseases of the digestive system: Z87.19

## 2016-07-28 HISTORY — DX: Unspecified convulsions: R56.9

## 2016-07-28 HISTORY — DX: Angina pectoris, unspecified: I20.9

## 2016-07-28 HISTORY — DX: Family history of other specified conditions: Z84.89

## 2016-07-28 LAB — BASIC METABOLIC PANEL
Anion gap: 11 (ref 5–15)
BUN: 9 mg/dL (ref 6–20)
CO2: 23 mmol/L (ref 22–32)
Calcium: 9.3 mg/dL (ref 8.9–10.3)
Chloride: 104 mmol/L (ref 101–111)
Creatinine, Ser: 0.79 mg/dL (ref 0.44–1.00)
GFR calc Af Amer: >60 mL/min (ref >60)
GFR calc non Af Amer: >60 mL/min (ref >60)
Glucose, Bld: 88 mg/dL (ref 65–99)
Potassium: 3.4 mmol/L — ABNORMAL LOW (ref 3.5–5.1)
Sodium: 138 mmol/L (ref 135–145)

## 2016-07-28 LAB — CBC
HCT: 30.3 % — ABNORMAL LOW (ref 36.0–46.0)
Hemoglobin: 11.1 g/dL — ABNORMAL LOW (ref 12.0–15.0)
MCH: 29.1 pg (ref 26.0–34.0)
MCHC: 36.6 g/dL — ABNORMAL HIGH (ref 30.0–36.0)
MCV: 79.3 fL (ref 78.0–100.0)
Platelets: 195 10*3/uL (ref 150–400)
RBC: 3.82 MIL/uL — ABNORMAL LOW (ref 3.87–5.11)
RDW: 12.5 % (ref 11.5–15.5)
WBC: 5.1 10*3/uL (ref 4.0–10.5)

## 2016-07-28 NOTE — Progress Notes (Signed)
Pt. Here for PAT, reporting that she has not been given a reason but her surgery was moved from Day surgery to main OR. Pt. Was [facility] hosp[facility], for complaint of presyncopal episodes, dysnpea, R sided weakness, causing not being able to walk,  acute hypokalemia,  Red swelling around both eyes, and redness  Of both sides of her face. Today she presents still having redness especially above both eyes, states since  she saw Dr. Suszanne Connerseoh she has had swelling & pressure feeling in her ears, more than before. Pt. Someone vague about cardiac testing while [facility].  Pt. Has seen PCP- Hanaj since d/c fr. Hosp., pt. Doesn;'t relate any new findings by Dr. Clementeen GrahamHanaji.  [facility] for [facility]/c summary & labs, ekg, cardiac testing if any done while she was there.

## 2016-07-28 NOTE — Pre-Procedure Instructions (Signed)
Yesenia SonsStella L Boyd  07/28/2016      [facility] - [place], [place] - [address] [place] [address] Phone: [phone] Fax: [phone]  [id number] [place], [place] - [address] [facility] [address] [place] [address] Phone: [phone] Fax: [phone]  [facility] - [place], [place] - [address] [place] [address] Phone: [phone] Fax: [phone]    Your procedure is scheduled on 08/03/2016  [facility] Admitting at 7:40 A.M.  Call this number if you have problems the morning of surgery:  [phone]   Remember:  Do not eat food or drink liquids after midnight.  On 9/26- TUESDAY  Take these medicines the morning of surgery with A SIP OF WATER :  TYLENOL if needed is OK  STOP Vitamin E   Do not wear jewelry, make-up or nail polish.   Do not wear lotions, powders, or perfumes,  or deoderant.   Do not shave 48 hours prior to surgery.     Do not bring valuables to the hospital.   [facility] is not responsible for any belongings or valuables.  Contacts, dentures or bridgework may not be worn into surgery.  Leave your suitcase in the car.  After surgery it may be brought to your room.  For patients admitted to the hospital, discharge time will be determined by your treatment team.  Patients discharged the day of surgery will not be allowed to drive home.   Name and phone number of your driver:   With husband   Special instructions:  Special Instructions: [place] - Preparing for Surgery  Before surgery, you can play an important role.  Because skin is not sterile, your skin needs to be as free of germs as possible.  You can reduce the number of germs on you skin by washing with CHG (chlorahexidine gluconate) soap before surgery.  CHG is an antiseptic cleaner which kills germs and bonds with the skin to continue killing germs even after washing.  Please  DO NOT use if you have an allergy to CHG or antibacterial soaps.  If your skin becomes reddened/irritated stop using the CHG and inform your nurse when you arrive at Short Stay.  Do not shave (including legs and underarms) for at least 48 hours prior to the first CHG shower.  You may shave your face.  Please follow these instructions carefully:   1.  Shower with CHG Soap the night before surgery and the  morning of Surgery.  2.  If you choose to wash your hair, wash your hair first as usual with your  normal shampoo.  3.  After you shampoo, rinse your hair and body thoroughly to remove the  Shampoo.  4.  Use CHG as you would any other liquid soap.  You can apply chg directly to the skin and wash gently with scrungie or a clean washcloth.  5.  Apply the CHG Soap to your body ONLY FROM THE NECK DOWN.    Do not use on open wounds or open sores.  Avoid contact with your eyes, ears, mouth and genitals (private parts).  Wash genitals (private parts)   with your normal soap.  6.  Wash thoroughly, paying special attention to the area where your surgery will be performed.  7.  Thoroughly rinse your body with warm water from the neck down.  8.  DO NOT shower/wash with your normal soap after using and rinsing off   the CHG Soap.  9.  Pat yourself dry with a clean towel.            10.  Wear clean pajamas.            11.  Place clean sheets on your bed the night of your first shower and do not sleep with pets.  Day of Surgery  Do not apply any lotions/deodorants the morning of surgery.  Please wear clean clothes to the hospital/surgery center.  Please read over the following fact sheets that you were given. Pain Booklet, Coughing and Deep Breathing and Surgical Site Infection Prevention

## 2016-07-29 NOTE — Progress Notes (Addendum)
Anesthesia Chart Review:  Pt is a 60 year old female scheduled for B nasal septoplasty with turbinate reduction on 08/03/2016 with Newman PiesSu Teoh, MD.   PCP is Lia HoppingXaje Hasanaj, MD who has cleared pt for surgery.   PMH includes:  TIA (2010, 2012), asthma, myasthenia gravis (in remission), seizures, OSA (no CPAP), GERD. Never smoker. BMI 25  Medications include: albuterol, zantac  Preoperative labs reviewed.    EKG 05/30/16: sinus rhythm. Probable LA enlargement. RSR' in V1 or V2, probably normal variant.   If no changes, I anticipate pt can proceed with surgery as scheduled.   Rica Mastngela [place], FNP-BC [facility]iology Phone: [phone] 08/01/2016 4:12 PM

## 2016-08-03 ENCOUNTER — Encounter (HOSPITAL_COMMUNITY): Payer: Self-pay | Admitting: *Deleted

## 2016-08-03 ENCOUNTER — Ambulatory Visit (HOSPITAL_COMMUNITY): Payer: 59 | Admitting: Anesthesiology

## 2016-08-03 ENCOUNTER — Ambulatory Visit (HOSPITAL_COMMUNITY): Payer: 59 | Admitting: Emergency Medicine

## 2016-08-03 ENCOUNTER — Ambulatory Visit (HOSPITAL_BASED_OUTPATIENT_CLINIC_OR_DEPARTMENT_OTHER)
Admission: RE | Admit: 2016-08-03 | Discharge: 2016-08-03 | Disposition: A | Discharge status: Home / Self Care | Payer: 59 | Source: Ambulatory Visit | Attending: Otolaryngology | Admitting: Otolaryngology

## 2016-08-03 ENCOUNTER — Encounter (HOSPITAL_COMMUNITY)
Admission: RE | Disposition: A | Discharge status: Home / Self Care | Payer: Self-pay | Source: Ambulatory Visit | Attending: Otolaryngology

## 2016-08-03 DIAGNOSIS — J343 Hypertrophy of nasal turbinates: Secondary | ICD-10-CM | POA: Insufficient documentation

## 2016-08-03 DIAGNOSIS — J45909 Unspecified asthma, uncomplicated: Secondary | ICD-10-CM | POA: Insufficient documentation

## 2016-08-03 DIAGNOSIS — Z882 Allergy status to sulfonamides status: Secondary | ICD-10-CM | POA: Diagnosis not present

## 2016-08-03 DIAGNOSIS — Z888 Allergy status to other drugs, medicaments and biological substances status: Secondary | ICD-10-CM | POA: Diagnosis not present

## 2016-08-03 DIAGNOSIS — Z880 Allergy status to penicillin: Secondary | ICD-10-CM | POA: Insufficient documentation

## 2016-08-03 DIAGNOSIS — Z8673 Personal history of transient ischemic attack (TIA), and cerebral infarction without residual deficits: Secondary | ICD-10-CM | POA: Diagnosis not present

## 2016-08-03 DIAGNOSIS — Z881 Allergy status to other antibiotic agents status: Secondary | ICD-10-CM | POA: Diagnosis not present

## 2016-08-03 DIAGNOSIS — J310 Chronic rhinitis: Secondary | ICD-10-CM | POA: Diagnosis not present

## 2016-08-03 DIAGNOSIS — I209 Angina pectoris, unspecified: Secondary | ICD-10-CM | POA: Insufficient documentation

## 2016-08-03 DIAGNOSIS — Z886 Allergy status to analgesic agent status: Secondary | ICD-10-CM | POA: Diagnosis not present

## 2016-08-03 DIAGNOSIS — J3489 Other specified disorders of nose and nasal sinuses: Secondary | ICD-10-CM | POA: Insufficient documentation

## 2016-08-03 DIAGNOSIS — K219 Gastro-esophageal reflux disease without esophagitis: Secondary | ICD-10-CM | POA: Diagnosis not present

## 2016-08-03 DIAGNOSIS — K449 Diaphragmatic hernia without obstruction or gangrene: Secondary | ICD-10-CM | POA: Insufficient documentation

## 2016-08-03 DIAGNOSIS — F329 Major depressive disorder, single episode, unspecified: Secondary | ICD-10-CM | POA: Insufficient documentation

## 2016-08-03 DIAGNOSIS — Z91040 Latex allergy status: Secondary | ICD-10-CM | POA: Diagnosis not present

## 2016-08-03 DIAGNOSIS — G4730 Sleep apnea, unspecified: Secondary | ICD-10-CM | POA: Diagnosis not present

## 2016-08-03 DIAGNOSIS — R569 Unspecified convulsions: Secondary | ICD-10-CM | POA: Insufficient documentation

## 2016-08-03 DIAGNOSIS — Z885 Allergy status to narcotic agent status: Secondary | ICD-10-CM | POA: Diagnosis not present

## 2016-08-03 DIAGNOSIS — M797 Fibromyalgia: Secondary | ICD-10-CM | POA: Insufficient documentation

## 2016-08-03 DIAGNOSIS — K589 Irritable bowel syndrome without diarrhea: Secondary | ICD-10-CM | POA: Diagnosis not present

## 2016-08-03 DIAGNOSIS — J342 Deviated nasal septum: Secondary | ICD-10-CM | POA: Diagnosis not present

## 2016-08-03 HISTORY — PX: NASAL SEPTOPLASTY W/ TURBINOPLASTY: SHX2070

## 2016-08-03 SURGERY — SEPTOPLASTY, NOSE, WITH NASAL TURBINATE REDUCTION
Anesthesia: General | Site: Nose | Laterality: Bilateral

## 2016-08-03 MED ORDER — ONDANSETRON HCL 4 MG/2ML IJ SOLN
INTRAMUSCULAR | Status: DC | PRN
  Administered 2016-08-03: 4 mg via INTRAVENOUS

## 2016-08-03 MED ORDER — MINERAL OIL LIGHT 100 % EX OIL
TOPICAL_OIL | CUTANEOUS | Status: AC
Start: 2016-08-03 — End: 2016-08-03
  Filled 2016-08-03: qty 25

## 2016-08-03 MED ORDER — LACTATED RINGERS IV SOLN
INTRAVENOUS | Status: DC
  Administered 2016-08-03: 08:00:00 via INTRAVENOUS

## 2016-08-03 MED ORDER — 0.9 % SODIUM CHLORIDE (POUR BTL) OPTIME
TOPICAL | Status: DC | PRN
  Administered 2016-08-03: 1000 mL

## 2016-08-03 MED ORDER — DIPHENHYDRAMINE HCL 50 MG/ML IJ SOLN
INTRAMUSCULAR | Status: AC
  Filled 2016-08-03: qty 1

## 2016-08-03 MED ORDER — FENTANYL CITRATE (PF) 100 MCG/2ML IJ SOLN
INTRAMUSCULAR | Status: DC | PRN
  Administered 2016-08-03 (×3): 50 ug via INTRAVENOUS

## 2016-08-03 MED ORDER — OXYMETAZOLINE HCL 0.05 % NA SOLN
NASAL | Status: AC
Start: 2016-08-03 — End: 2016-08-03
  Filled 2016-08-03: qty 15

## 2016-08-03 MED ORDER — HYDROMORPHONE HCL 1 MG/ML IJ SOLN
0.2500 mg | INTRAMUSCULAR | Status: DC | PRN
  Administered 2016-08-03 (×2): 0.5 mg via INTRAVENOUS

## 2016-08-03 MED ORDER — OXYMETAZOLINE HCL 0.05 % NA SOLN
NASAL | Status: DC | PRN
  Administered 2016-08-03: 1 via TOPICAL

## 2016-08-03 MED ORDER — FENTANYL CITRATE (PF) 100 MCG/2ML IJ SOLN
INTRAMUSCULAR | Status: AC
  Filled 2016-08-03: qty 2

## 2016-08-03 MED ORDER — LIDOCAINE 2% (20 MG/ML) 5 ML SYRINGE
INTRAMUSCULAR | Status: AC
  Filled 2016-08-03: qty 5

## 2016-08-03 MED ORDER — MIDAZOLAM HCL 2 MG/2ML IJ SOLN
INTRAMUSCULAR | Status: AC
  Filled 2016-08-03: qty 2

## 2016-08-03 MED ORDER — HYDROCODONE-ACETAMINOPHEN 5-325 MG PO TABS: 1.0000 | ORAL_TABLET | ORAL | 0 refills | Status: DC | PRN

## 2016-08-03 MED ORDER — LIDOCAINE-EPINEPHRINE 1 %-1:100000 IJ SOLN
INTRAMUSCULAR | Status: DC | PRN
  Administered 2016-08-03: 2 mL via INTRADERMAL

## 2016-08-03 MED ORDER — OXYMETAZOLINE HCL 0.05 % NA SOLN
NASAL | Status: AC
  Filled 2016-08-03: qty 15

## 2016-08-03 MED ORDER — DEXAMETHASONE SODIUM PHOSPHATE 10 MG/ML IJ SOLN
INTRAMUSCULAR | Status: AC
  Filled 2016-08-03: qty 1

## 2016-08-03 MED ORDER — HYDROMORPHONE HCL 1 MG/ML IJ SOLN
INTRAMUSCULAR | Status: AC
  Filled 2016-08-03: qty 1

## 2016-08-03 MED ORDER — PROPOFOL 10 MG/ML IV BOLUS
INTRAVENOUS | Status: DC | PRN
  Administered 2016-08-03: 140 mg via INTRAVENOUS

## 2016-08-03 MED ORDER — MINERAL OIL LIGHT 100 % EX OIL
TOPICAL_OIL | CUTANEOUS | Status: DC | PRN
  Administered 2016-08-03: 1 via TOPICAL

## 2016-08-03 MED ORDER — DEXAMETHASONE SODIUM PHOSPHATE 10 MG/ML IJ SOLN
INTRAMUSCULAR | Status: DC | PRN
  Administered 2016-08-03: 10 mg via INTRAVENOUS

## 2016-08-03 MED ORDER — PROPOFOL 10 MG/ML IV BOLUS
INTRAVENOUS | Status: AC
  Filled 2016-08-03: qty 20

## 2016-08-03 MED ORDER — ROCURONIUM BROMIDE 100 MG/10ML IV SOLN
INTRAVENOUS | Status: DC | PRN
  Administered 2016-08-03: 50 mg via INTRAVENOUS

## 2016-08-03 MED ORDER — LIDOCAINE HCL (CARDIAC) 20 MG/ML IV SOLN
INTRAVENOUS | Status: DC | PRN
  Administered 2016-08-03: 60 mg via INTRATRACHEAL
  Administered 2016-08-03: 100 mg via INTRAVENOUS

## 2016-08-03 MED ORDER — DIPHENHYDRAMINE HCL 50 MG/ML IJ SOLN
12.5000 mg | Freq: Once | INTRAMUSCULAR | Status: AC
  Administered 2016-08-03: 12.5 mg via INTRAVENOUS

## 2016-08-03 MED ORDER — LIDOCAINE-EPINEPHRINE 1 %-1:100000 IJ SOLN
INTRAMUSCULAR | Status: AC
  Filled 2016-08-03: qty 1

## 2016-08-03 MED ORDER — PROMETHAZINE HCL 25 MG/ML IJ SOLN: 6.2500 mg | INTRAMUSCULAR | Status: DC | PRN

## 2016-08-03 MED ORDER — BACITRACIN ZINC 500 UNIT/GM EX OINT
TOPICAL_OINTMENT | CUTANEOUS | Status: DC | PRN
  Administered 2016-08-03: 1 via TOPICAL

## 2016-08-03 MED ORDER — BACITRACIN ZINC 500 UNIT/GM EX OINT
TOPICAL_OINTMENT | CUTANEOUS | Status: AC
  Filled 2016-08-03: qty 28.35

## 2016-08-03 MED ORDER — MIDAZOLAM HCL 5 MG/5ML IJ SOLN
INTRAMUSCULAR | Status: DC | PRN
Start: 2016-08-03 — End: 2016-08-03
  Administered 2016-08-03: 2 mg via INTRAVENOUS

## 2016-08-03 MED ORDER — SUGAMMADEX SODIUM 200 MG/2ML IV SOLN
INTRAVENOUS | Status: DC | PRN
  Administered 2016-08-03: 200 mg via INTRAVENOUS

## 2016-08-03 SURGICAL SUPPLY — 26 items
CANISTER SUCTION 2500CC (MISCELLANEOUS) ×2 IMPLANT
COAGULATOR SUCT SWTCH 10FR 6 (ELECTROSURGICAL) ×2 IMPLANT
COVER SURGICAL LIGHT HANDLE (MISCELLANEOUS) ×1 IMPLANT
DRAPE PROXIMA HALF (DRAPES) ×1 IMPLANT
ELECT REM PT RETURN 9FT ADLT (ELECTROSURGICAL) ×2
ELECTRODE REM PT RTRN 9FT ADLT (ELECTROSURGICAL) ×1 IMPLANT
GAUZE SPONGE 2X2 8PLY STRL LF (GAUZE/BANDAGES/DRESSINGS) ×1 IMPLANT
GLOVE ECLIPSE 7.5 STRL STRAW (GLOVE) ×2 IMPLANT
GOWN STRL REUS W/ TWL LRG LVL3 (GOWN DISPOSABLE) ×2 IMPLANT
GOWN STRL REUS W/TWL LRG LVL3 (GOWN DISPOSABLE) ×6
KIT BASIN OR (CUSTOM PROCEDURE TRAY) ×2 IMPLANT
KIT ROOM TURNOVER OR (KITS) ×2 IMPLANT
NDL HYPO 25GX1X1/2 BEV (NEEDLE) ×1 IMPLANT
NEEDLE HYPO 25GX1X1/2 BEV (NEEDLE) ×2 IMPLANT
NS IRRIG 1000ML POUR BTL (IV SOLUTION) ×2 IMPLANT
PAD ARMBOARD 7.5X6 YLW CONV (MISCELLANEOUS) ×4 IMPLANT
SPLINT NASAL DOYLE BI-VL (GAUZE/BANDAGES/DRESSINGS) ×2 IMPLANT
SPONGE GAUZE 2X2 STER 10/PKG (GAUZE/BANDAGES/DRESSINGS) ×1
SPONGE NEURO XRAY DETECT 1X3 (DISPOSABLE) ×2 IMPLANT
SUT CHROMIC 3 0 SH 27 (SUTURE) ×2 IMPLANT
SUT PLAIN 4 0 ~~LOC~~ 1 (SUTURE) ×2 IMPLANT
SUT PROLENE 2 0 FS (SUTURE) ×2 IMPLANT
SUT PROLENE 3 0 PS 1 (SUTURE) ×1 IMPLANT
TRAY ENT MC OR (CUSTOM PROCEDURE TRAY) ×2 IMPLANT
TUBE SALEM SUMP 16 FR W/ARV (TUBING) IMPLANT
TUBING EXTENTION W/L.L. (IV SETS) IMPLANT

## 2016-08-03 NOTE — Op Note (Signed)
DATE OF PROCEDURE: 08/03/2016  OPERATIVE REPORT   SURGEON: Newman PiesSu Francis Yardley, MD   PREOPERATIVE DIAGNOSES:  1. Severe nasal septal deviation.  2. Bilateral inferior turbinate hypertrophy.  3. Chronic nasal obstruction.  POSTOPERATIVE DIAGNOSES:  1. Severe nasal septal deviation.  2. Bilateral inferior turbinate hypertrophy.  3. Chronic nasal obstruction.  PROCEDURE PERFORMED:  1. Septoplasty.  2. Bilateral partial inferior turbinate resection.   ANESTHESIA: General endotracheal tube anesthesia.   COMPLICATIONS: None.   ESTIMATED BLOOD LOSS: Less than 50 mL.   INDICATION FOR PROCEDURE: Yesenia Boyd is a 60 y.o. female with a history of chronic nasal obstruction. The patient was treated with antihistamine, decongestant, steroid nasal spray, and systemic steroids. However, the patient continues to be symptomatic. On examination, the patient was noted to have bilateral severe inferior turbinate hypertrophy and significant nasal septal deviation, causing significant nasal obstruction. Based on the above findings, the decision was made for the patient to undergo the above-stated procedure. The risks, benefits, alternatives, and details of the procedure were discussed with the patient. Questions were invited and answered. Informed consent was obtained.   DESCRIPTION OF PROCEDURE: The patient was taken to the operating room and placed supine on the operating table. General endotracheal tube anesthesia was administered by the anesthesiologist. The patient was positioned, and prepped and draped in the standard fashion for nasal surgery. Pledgets soaked with Afrin were placed in both nasal cavities for decongestion. The pledgets were subsequently removed. The above mentioned severe septal deviation was again noted. 1% lidocaine with 1:100,000 epinephrine was injected onto the nasal septum bilaterally. A hemitransfixion incision was made on the left side. The mucosal flap was carefully elevated on the left  side. A cartilaginous incision was made 1 cm superior to the caudal margin of the nasal septum. Mucosal flap was also elevated on the right side in the similar fashion. It should be noted that due to the severe septal deviation, the deviated portion of the cartilaginous and bony septum had to be removed in piecemeal fashion. Once the deviated portions were removed, a straight midline septum was achieved. The septum was then quilted with 4-0 plain gut sutures. The hemitransfixion incision was closed with interrupted 4-0 chromic sutures. Doyle splints were applied.   Prior to the [facility] application, the inferior one half of both hypertrophied inferior turbinate was crossclamped with a Kelly clamp. The inferior one half of each inferior turbinate was then resected with a pair of cross cutting scissors. Hemostasis was achieved with a suction cautery device.   The care of the patient was turned over to the anesthesiologist. The patient was awakened from anesthesia without difficulty. The patient was extubated and transferred to the recovery room in good condition.   OPERATIVE FINDINGS: Severe nasal septal deviation and bilateral inferior turbinate hypertrophy.   SPECIMEN: None.   FOLLOWUP CARE: The patient be discharged home once she is awake and alert. The patient will be placed on vicodin p.r.n. pain. The patient will follow up in my office in approximately 1 week for splint removal.   Everli Rother Philomena DohenyWooi Adalid Beckmann, MD

## 2016-08-03 NOTE — Anesthesia Preprocedure Evaluation (Signed)
Anesthesia Evaluation  Patient identified by MRN, date of birth, ID band Patient awake    Reviewed: Allergy & Precautions, H&P , NPO status , Patient's Chart, lab work & pertinent test results  History of Anesthesia Complications (+) Family history of anesthesia reaction  Airway Mallampati: II  TM Distance: >3 FB Neck ROM: Full    Dental  (+) Missing, Dental Advisory Given,  Both sides upper all missing. Only 4 center teeth present.:   Pulmonary shortness of breath and with exertion, asthma , sleep apnea ,    Pulmonary exam normal breath sounds clear to auscultation       Cardiovascular Exercise Tolerance: Good + angina Normal cardiovascular exam Rhythm:Regular Rate:Normal  CXR and ECG reviewed.   Neuro/Psych Seizures -,  PSYCHIATRIC DISORDERS Depression Myasthenia Gravis. No medicines for MG currently, but does sometimes have symptoms of weakness, difficulty swallowing. TIA 2012 TIA Neuromuscular disease CVA, No Residual Symptoms    GI/Hepatic Neg liver ROS, hiatal hernia, GERD  Medicated,  Endo/Other  negative endocrine ROS  Renal/GU negative Renal ROS  negative genitourinary   Musculoskeletal  (+) Fibromyalgia -  Abdominal   Peds negative pediatric ROS (+)  Hematology negative hematology ROS (+)   Anesthesia Other Findings   Reproductive/Obstetrics negative OB ROS                             Anesthesia Physical  Anesthesia Plan  ASA: III  Anesthesia Plan: General   Post-op Pain Management:    Induction: Intravenous  Airway Management Planned: Oral ETT  Additional Equipment:   Intra-op Plan:   Post-operative Plan: Extubation in OR  Informed Consent: I have reviewed the patients History and Physical, chart, labs and discussed the procedure including the risks, benefits and alternatives for the proposed anesthesia with the patient or authorized representative who has  indicated his/her understanding and acceptance.   Dental advisory given  Plan Discussed with: CRNA  Anesthesia Plan Comments:         Anesthesia Quick Evaluation

## 2016-08-03 NOTE — H&P (Signed)
Cc: Chronic nasal obstruction  HPI: The patient is a 60 year old female who returns today for her follow-up evaluation.  She was last seen 4 weeks ago. At that time, she was noted to have chronic rhinitis without evidence of acute sinusitis.  She was noted to have significant nasal obstruction secondary to her nasal mucosal congestion and inferior turbinate hypertrophy.  Polypoid tissue was also noted to obstruct her middle meatus. She was continued on her allergy treatment regimen which includes Flonase nasal spray and Astelin.  She subsequently underwent a paranasal sinus CT scan.  The CT scan showed no evidence of acute or chronic sinusitis.  The CT did show significant nasal septal deviation and bilateral inferior turbinate hypertrophy.  The patient returns complaining of persistent nasal congestion.  She has significant difficulty breathing through her nostrils.  She is a habitual mouth breather. The patient has not responded to her allergy medications and steroid nasal spray.  She is interested in more definitive treatment of her chronic nasal obstruction.   Exam: The nasal cavities were decongested and anesthetised with a combination of oxymetazoline and 4% lidocaine solution.  The flexible scope was inserted into the right nasal cavity.  Endoscopy of the inferior and middle meatus was performed.  Edematous mucosa was noted.  No polyp, mass, or lesion was appreciated.  Olfactory cleft was clear.  Nasopharynx was clear.  NSD noted. Turbinates were hypertrophied but without mass.  Incomplete response to decongestion.  The procedure was repeated on the contralateral side with similar findings.  The patient tolerated the procedure well.  Instructions were given to avoid eating or drinking for 2 hours.    Assessment: 1.  Chronic rhinitis with nasal mucosal congestion, nasal septal deviation and bilateral severe inferior turbinate hypertrophy.  More than 95% of her nasal passageways are obstructed  bilaterally.   2.  The patient's CT scan showed no evidence of acute or chronic sinusitis.  Incidental finding of right maxillary and sphenoid mucous retention cysts.    Plan: 1.  The nasal endoscopy findings and the CT images are reviewed with the patient.  2.  The patient should continue with her Flonase nasal spray and Astelin.   3.  Based on the above findings, she may also benefit from undergoing surgical intervention with septoplasty and bilateral turbinate reduction. The risks, benefits, alternatives and details of the procedures are extensively discussed.   4.  The patient would like to proceed with the procedures.

## 2016-08-03 NOTE — Transfer of Care (Signed)
Immediate Anesthesia Transfer of Care Note  Patient: Erin SonsStella L Nahar  Procedure(s) Performed: Procedure(s): NASAL SEPTOPLASTY WITH TURBINATE REDUCTION (Bilateral)  Patient Location: PACU  Anesthesia Type:General  Level of Consciousness: awake, alert  and patient cooperative  Airway & Oxygen Therapy: Patient Spontanous Breathing and Patient connected to face mask oxygen  Post-op Assessment: Report given to RN, Post -op Vital signs reviewed and stable and Patient moving all extremities  Post vital signs: Reviewed and stable  Last Vitals:  Vitals:   08/03/16 0742 08/03/16 0948  BP:    Pulse: (!) 58   Resp:    Temp:  36.4 C    Last Pain:  Vitals:   08/03/16 0739  TempSrc: Oral      Patients Stated Pain Goal: 2 (08/03/16 0820)  Complications: No apparent anesthesia complications

## 2016-08-03 NOTE — Anesthesia Postprocedure Evaluation (Signed)
Anesthesia Post Note  Patient: Yesenia Boyd  Procedure(s) Performed: Procedure(s) (LRB): NASAL SEPTOPLASTY WITH TURBINATE REDUCTION (Bilateral)  Patient location during evaluation: PACU Anesthesia Type: General Level of consciousness: sedated and patient cooperative Pain management: pain level controlled Vital Signs Assessment: post-procedure vital signs reviewed and stable Respiratory status: spontaneous breathing Cardiovascular status: stable Anesthetic complications: no    Last Vitals:  Vitals:   08/03/16 1131 08/03/16 1145  BP:  135/84  Pulse:  66  Resp:  12  Temp: 36.7 C     Last Pain:  Vitals:   08/03/16 1145  TempSrc:   PainSc: 0-No pain                 Lewie LoronJohn Maysa Lynn

## 2016-08-03 NOTE — Anesthesia Procedure Notes (Signed)
Procedure Name: Intubation Date/Time: 08/03/2016 8:46 AM Performed by: Myna Bright Pre-anesthesia Checklist: Emergency Drugs available, Patient identified, Suction available and Patient being monitored Patient Re-evaluated:Patient Re-evaluated prior to inductionOxygen Delivery Method: Circle system utilized Preoxygenation: Pre-oxygenation with 100% oxygen Intubation Type: IV induction Ventilation: Mask ventilation without difficulty Laryngoscope Size: Mac and 3 Grade View: Grade II Tube type: Oral Tube size: 7.0 mm Number of attempts: 1 Airway Equipment and Method: Stylet and LTA kit utilized Placement Confirmation: ETT inserted through vocal cords under direct vision,  positive ETCO2 and breath sounds checked- equal and bilateral Secured at: 21 cm Tube secured with: Tape Dental Injury: Teeth and Oropharynx as per pre-operative assessment

## 2016-08-04 ENCOUNTER — Encounter (HOSPITAL_COMMUNITY): Payer: Self-pay | Admitting: Otolaryngology

## 2016-08-08 ENCOUNTER — Ambulatory Visit (INDEPENDENT_AMBULATORY_CARE_PROVIDER_SITE_OTHER): Payer: 59 | Admitting: Otolaryngology

## 2016-08-10 ENCOUNTER — Ambulatory Visit: Payer: Self-pay | Admitting: Gastroenterology

## 2016-08-22 ENCOUNTER — Ambulatory Visit (INDEPENDENT_AMBULATORY_CARE_PROVIDER_SITE_OTHER): Payer: 59 | Admitting: Otolaryngology

## 2016-08-22 ENCOUNTER — Ambulatory Visit (INDEPENDENT_AMBULATORY_CARE_PROVIDER_SITE_OTHER): Payer: 59 | Admitting: Psychology

## 2016-08-22 DIAGNOSIS — F419 Anxiety disorder, unspecified: Secondary | ICD-10-CM | POA: Diagnosis not present

## 2016-08-22 DIAGNOSIS — F331 Major depressive disorder, recurrent, moderate: Secondary | ICD-10-CM

## 2016-08-24 ENCOUNTER — Encounter: Payer: Self-pay | Admitting: Gastroenterology

## 2016-08-24 ENCOUNTER — Encounter (HOSPITAL_COMMUNITY): Payer: Self-pay | Admitting: Psychology

## 2016-08-24 ENCOUNTER — Ambulatory Visit (INDEPENDENT_AMBULATORY_CARE_PROVIDER_SITE_OTHER): Payer: 59 | Admitting: Gastroenterology

## 2016-08-24 DIAGNOSIS — R1310 Dysphagia, unspecified: Secondary | ICD-10-CM

## 2016-08-24 DIAGNOSIS — K581 Irritable bowel syndrome with constipation: Secondary | ICD-10-CM | POA: Diagnosis not present

## 2016-08-24 DIAGNOSIS — K219 Gastro-esophageal reflux disease without esophagitis: Secondary | ICD-10-CM | POA: Diagnosis not present

## 2016-08-24 DIAGNOSIS — R1319 Other dysphagia: Secondary | ICD-10-CM

## 2016-08-24 NOTE — Progress Notes (Signed)
Subjective:    Patient ID: Yesenia Boyd, female    DOB: 1956/06/08, 60 y.o.   MRN: [id number]  Toma Deiters, MD  HPI Finally how make [name] work the best. NOT USING CITRATE AS MUCH. CONCERNED ABOUT ROLLING BOWELS. JUST HAD SURGERY ON NOSE. GETTING USED TO BREATHING OUT OF HER NOSE. HAVING TO USE ENEMA WHEN PRESSURE BUILDS UP DOWN THERE ONCE A WEEK. TRIES TO THE BEST SHE CAN. TWO BABIES VAGINAL. CHILLS ALL THE TIME. HEARTBURN: SO SO. LOTS OF REFLUX. COULDN'T EAT BEFORE SURGERY AND THEN HAD TO RE-LEARN. HAS ABDOMINAL PAIN IN SPELLS AND WHEN IT'S BAD SHE GOES TO LIQUIDS. STAYS SORE RIGHT IN THE UPPER MIDDLE ABDOMEN. WAS DOING ENSURE AFTER SURGERY. WAS HAVING TROUBLE WITH CHEST PAIN([facility]). BREATHING GETTING BETTER. RARE NAUSEA AND RARE REFLUX-VOMITING(NO BLOOD, CLEAR)  PT DENIES FEVER, HEMATOCHEZIA, HEMATEMESIS, melena, diarrhea,  CHANGE IN BOWEL IN HABITS, problems swallowing, OR problems with sedation.  Past Medical History:  Diagnosis Date  . Anginal pain (HCC)   . Asthma   . Bile reflux gastritis   . Depression    pt believes it's related to multiple sicknesses  . Family history of adverse reaction to anesthesia    sister required ventilatory support after tonsillectomy- told that " in the future need to be very careful with anesth."  . Fibromyalgia    sees Dr Philis Kendall at [facility]  . GERD (gastroesophageal reflux disease)   . History of hiatal hernia    2 surgeries, Lap. Nissen Fundiplication   . IBS (irritable bowel syndrome)   . Magnesium deficiency   . Myasthenia gravis    right sided weakness occ.  . Myasthenia gravis in remission (HCC)   . Neuropathy (HCC)   . Numbness and tingling in right hand    and whole left side of body-pt sts she has "spells to where she cannot walk or talk"  . Peripheral neuropathy (HCC)   . S/P colonoscopy April 2009, Sept 2012   normal Dr. Linna Darner, internal hemorrhoids, repeat in Sept 2022  . S/P endoscopy 2008, Aug 2010, Sept 2012   Dr. Linna Darner 2008:  removal of impacted food bolus, Dr. Linna Darner 2010: moderate gastritis, Sept 2012 with SLF: path with mild gastritis, no definite stricture noted, dilation with Savary 16 mm  . Seizures (HCC)    Pt. reports -yes, pt. reports that she blanks out sometimes & the doctors have told her that's like a seizure    . Shortness of breath dyspnea    pt believes it's related to asthma  . Sleep apnea 2010   no cpap used, was told to get oxygen  to use, pt. lnows that she needs new evaluation   . Stroke [facility])    TIA 2010  . TIA (transient ischemic attack) 2012    Past Surgical History:  Procedure Laterality Date  . ABDOMINAL HYSTERECTOMY     complete per patient  . BLADDER SURGERY     stretch bladder opening  . CHOLECYSTECTOMY    . COLONOSCOPY  07/18/2011   SLF: 1. internal hemorrhoids  . ESOPHAGEAL MANOMETRY N/A 01/21/2013   Procedure: ESOPHAGEAL MANOMETRY (EM);  Surgeon: Valarie Merino, MD;  Location: [facility];  Service: General;  Laterality: N/A;  . esophageal surgery?     2008 [facility]  . ESOPHAGOGASTRODUODENOSCOPY  07/18/2011   SLF: 1. Moderate gastritis 2. Dysphagia most likely 2o large food bolus moving through her Nissen, which appears to be intact. Pt has poor denttition and NL BPE 2  years ago. 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  Marland Kitchen. EVALUATION UNDER ANESTHESIA WITH HEMORRHOIDECTOMY N/A 03/27/2015   Procedure: EXAM UNDER ANESTHESIA WITH SPHINCTEROTOMY ;  Surgeon: Luretha MurphyMatthew Martin, MD;  Location: [facility];  Service: General;  Laterality: N/A;  . FLEXIBLE SIGMOIDOSCOPY N/A 12/09/2014   SLF: Rectal pain most likely due to internal and external hemorroids  . HAND SURGERY     carpal tumnnel right and removal of cyst left  . HEMORRHOID BANDING N/A 12/09/2014   Procedure: HEMORRHOID BANDING;  Surgeon: West BaliSandi L Osten Janek, MD;  Location: [facility];  Service: Endoscopy;  Laterality: N/A;  . LAPAROSCOPIC NISSEN FUNDOPLICATION N/A 02/13/2013   Procedure: Redo LAPAROSCOPIC NISSEN  FUNDOPLICATION;  Surgeon: Valarie MerinoMatthew B Martin, MD;  Location: [facility];  Service: General;  Laterality: N/A;  Forgut explortation with partial  takedown nissan funliplication from 1994 for wrap torsion and persistant dysphagia  . NASAL SEPTOPLASTY W/ TURBINOPLASTY Bilateral 08/03/2016   Procedure: NASAL SEPTOPLASTY WITH TURBINATE REDUCTION;  Surgeon: Newman PiesSu Teoh, MD;  Location: [facility];  Service: ENT;  Laterality: Bilateral;  . NECK SURGERY     removal of "knot" from neck  . NISSEN FUNDOPLICATION     twice  . SAVORY DILATION  07/18/2011   Surgeon:Laurie Lovejoy M Aprill Banko    Allergies  Allergen Reactions  . Aspirin Other (See Comments)    Stomach distress, rash  . Avelox [Moxifloxacin Hcl In Nacl] Anaphylaxis and Itching  . Linzess [Linaclotide] Swelling    Swelling, bloating, nausea.Med used for "IBS"  . Adhesive [Tape] Other (See Comments)    Red and itchy under tape  . Carbamazepine Other (See Comments)    Memory loss   . Cephalexin Hives and Itching  . Codeine Hives and Itching  . Cymbalta [Duloxetine Hcl] Other (See Comments)    Hair loss, nervousness, severe constipation, red blotches  . Darvon [Propoxyphene] Hives  . Dicyclomine Itching and Other (See Comments)    Nervousness  . Latex Itching  . Macrolides And Ketolides Swelling  . Meperidine Hcl Itching and Other (See Comments)    Nervousness  . Metronidazole Hives and Other (See Comments)    Red blotches.  . Morphine Itching and Other (See Comments)    Nervousness  . Penicillins Hives  . Pepcid [Famotidine] Nausea And Vomiting  . Prednisone Hives, Swelling and Other (See Comments)    Red blotches - Can take with Benadryl  . Propofol     Depression   . Protonix [Pantoprazole] Swelling  . Sulfonamide Derivatives Hives  . Tetracycline Other (See Comments)    Reaction unknown  . Tramadol Hcl Hives and Other (See Comments)    Nervousness    Current Outpatient Prescriptions  Medication Sig Dispense Refill  . acetaminophen (TYLENOL)  650 MG CR tablet Take 1,300 mg by mouth every 8 (eight) hours as needed for pain.     Marland Kitchen. albuterol (PROVENTIL HFA;VENTOLIN HFA) 108 (90 Base) MCG/ACT inhaler Inhale 1 puff into the lungs every 6 (six) hours as needed for wheezing or shortness of breath.    Marland Kitchen. CARAFATE 1 GM/10ML suspension Take 1 g by mouth 4 (four) times daily.     . Cholecalciferol ([employer] VIT D 5000 HIGH-POTENCY) 5000 UNITS capsule Take 10,000 Units by mouth every morning.     Marland Kitchen. EPINEPHrine 0.3 mg/0.3 mL IJ SOAJ injection Inject 0.3 mg into the muscle as needed (allergic reaction).    Marland Kitchen. estrogens, conjugated, (PREMARIN) 0.625 MG tablet Take 0.625 mg by mouth at  bedtime.     . hydrOXYzine (VISTARIL) 25 MG capsule Take 25 mg by mouth at bedtime.     Marland Kitchen L-Methylfolate-B6-B12 (METANX PO) Take 1 tablet by mouth at bedtime.     Marland Kitchen lubiprostone ([name]) 24 MCG capsule Take 24 mcg by mouth 2 (two) times daily with a meal.      . magnesium citrate solution Take 296 mLs by mouth daily as needed. For blockage    . montelukast (SINGULAIR) 10 MG tablet Take 10 mg by mouth at bedtime.    . nitroGLYCERIN (NITROSTAT) 0.4 MG SL tablet Place 0.4 mg under the tongue every 5 (five) minutes as needed for chest pain.    . ranitidine (ZANTAC) 300 MG tablet Take 300 mg by mouth at bedtime.    . simethicone (MYLICON) 125 MG chewable tablet Chew 125 mg by mouth every 6 (six) hours as needed for flatulence.    Marland Kitchen tiZANidine (ZANAFLEX) 4 MG tablet Take 4 mg by mouth at bedtime.     . triamcinolone (KENALOG) 0.1 % cream Apply 1 application topically 2 (two) times daily as needed (For hair). For skin.    Marland Kitchen vitamin E 1000 UNIT capsule Take 1,000 Units by mouth daily.    .   20 tablet 0   Review of Systems PER HPI OTHERWISE ALL SYSTEMS ARE NEGATIVE.     Objective:   Physical Exam  Constitutional: She is oriented to person, place, and time. She appears well-developed and well-nourished. No distress.  HENT:  Head: Normocephalic and atraumatic.  Mouth/Throat:  Oropharynx is clear and moist. No oropharyngeal exudate.  Eyes: Pupils are equal, round, and reactive to light. No scleral icterus.  Neck: Normal range of motion. Neck supple.  Cardiovascular: Normal rate, regular rhythm and normal heart sounds.   Pulmonary/Chest: Effort normal and breath sounds normal. No respiratory distress.  Abdominal: Soft. Bowel sounds are normal. She exhibits no distension. There is tenderness. There is no rebound and no guarding.  MILD PERI-UMBILICAL TTP  Musculoskeletal: She exhibits no edema.  Lymphadenopathy:    She has no cervical adenopathy.  Neurological: She is alert and oriented to person, place, and time.  NO  NEW FOCAL DEFICITS  Psychiatric: She has a normal mood and affect.  Vitals reviewed.     Assessment & Plan:

## 2016-08-24 NOTE — Assessment & Plan Note (Addendum)
SYMPTOMS FAIRLY WELL CONTROLLED.  DRINK WATER TO KEEP YOUR URINE LIGHT YELLOW. EAT FIBER IF TOLERATED. USE LACTASE IF EATING DAIRY. CONTINUE [name]. REMEMBER NOT TO STRAIN WITH BMs. PUSH BUT BLOW OUT TO RELAX YOUR PELVIC FLOOR. PLEASE CALL WITH QUESTIONS OR CONCERNS.  FOLLOW UP IN 6 MOS.

## 2016-08-24 NOTE — Assessment & Plan Note (Signed)
SYMPTOMS FAIRLY WELL CONTROLLED.  CONTINUE TO MONITOR SYMPTOMS. 
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
## 2016-08-24 NOTE — Progress Notes (Signed)
CC'D TO PCP °

## 2016-08-24 NOTE — Progress Notes (Signed)
ON RECALL  °

## 2016-08-24 NOTE — Assessment & Plan Note (Signed)
SYMPTOMS FAIRLY WELL CONTROLLED.   ZANTAC TWICE DAILY MON-FRI. USE CARAFATE IF NEEDED ON THE WEEKENDS.  PLEASE CALL WITH QUESTIONS OR CONCERNS.  FOLLOW UP IN 6 MOS.

## 2016-08-24 NOTE — Patient Instructions (Addendum)
DRINK WATER TO KEEP YOUR URINE LIGHT YELLOW.  EAT FIBER IF TOLERATED.  CONTINUE [name]. USE ENEMAS OR CITRATE IF NEEDE FOR BOWEL MOVEMENT.  REMEMBER NOT TO STRAIN WITH BMs. PUSH BUT BLOW OUT TO RELAX YOUR PELVIC FLOOR.  ZANTAC TWICE DAILY MON-FRI. USE CARAFATE IF NEEDED ON THE WEEKENDS.  IF YOU CONSUME DAIRY, use LACTASE 3 PILLS WITH MEALS UP TO THREE TIMES A DAY.  PLEASE CALL WITH QUESTIONS OR CONCERNS.  FOLLOW UP IN 6 MOS.

## 2016-09-12 ENCOUNTER — Ambulatory Visit (HOSPITAL_COMMUNITY): Payer: Self-pay | Admitting: Psychology

## 2016-09-20 ENCOUNTER — Ambulatory Visit (INDEPENDENT_AMBULATORY_CARE_PROVIDER_SITE_OTHER): Payer: 59 | Admitting: Psychology

## 2016-09-20 DIAGNOSIS — F331 Major depressive disorder, recurrent, moderate: Secondary | ICD-10-CM | POA: Diagnosis not present

## 2016-09-20 DIAGNOSIS — F419 Anxiety disorder, unspecified: Secondary | ICD-10-CM

## 2016-09-23 ENCOUNTER — Encounter (HOSPITAL_COMMUNITY): Payer: Self-pay | Admitting: Psychology
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
## 2016-10-13 ENCOUNTER — Ambulatory Visit (HOSPITAL_COMMUNITY): Payer: 59 | Admitting: Psychology

## 2016-10-24 DIAGNOSIS — Z1231 Encounter for screening mammogram for malignant neoplasm of breast: Secondary | ICD-10-CM | POA: Diagnosis not present

## 2016-10-24 DIAGNOSIS — R928 Other abnormal and inconclusive findings on diagnostic imaging of breast: Secondary | ICD-10-CM | POA: Diagnosis not present

## 2016-11-02 ENCOUNTER — Ambulatory Visit (HOSPITAL_COMMUNITY): Payer: Self-pay | Admitting: Psychology

## 2016-11-02 DIAGNOSIS — N6001 Solitary cyst of right breast: Secondary | ICD-10-CM | POA: Diagnosis not present

## 2016-11-16 ENCOUNTER — Ambulatory Visit (INDEPENDENT_AMBULATORY_CARE_PROVIDER_SITE_OTHER): Payer: 59 | Admitting: Psychology

## 2016-11-16 DIAGNOSIS — F331 Major depressive disorder, recurrent, moderate: Secondary | ICD-10-CM

## 2016-11-16 DIAGNOSIS — F419 Anxiety disorder, unspecified: Secondary | ICD-10-CM | POA: Diagnosis not present
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
## 2016-12-22 DIAGNOSIS — R5383 Other fatigue: Secondary | ICD-10-CM | POA: Diagnosis not present

## 2016-12-22 DIAGNOSIS — Z0000 Encounter for general adult medical examination without abnormal findings: Secondary | ICD-10-CM | POA: Diagnosis not present

## 2016-12-27 ENCOUNTER — Telehealth (HOSPITAL_COMMUNITY): Payer: Self-pay | Admitting: *Deleted

## 2016-12-27 NOTE — Telephone Encounter (Signed)
phone call from patient regarding treatment.   She said if we don't have anyone soon, she will began looking for someone else.   She was asked if she is in any danger to herself or anyone else.   She said no.   She was offered the opportunity to see one of our providers in one of our other locations, but she said she would think about it.

## 2017-01-09 ENCOUNTER — Encounter: Payer: Self-pay | Admitting: Gastroenterology

## 2017-01-18 ENCOUNTER — Telehealth (HOSPITAL_COMMUNITY): Payer: Self-pay | Admitting: *Deleted

## 2017-01-18 NOTE — Telephone Encounter (Signed)
returned phone call to patient regarding an appointment no answer, left voice message. 

## 2017-01-19 DIAGNOSIS — Z6826 Body mass index (BMI) 26.0-26.9, adult: Secondary | ICD-10-CM | POA: Diagnosis not present

## 2017-01-19 DIAGNOSIS — K51511 Left sided colitis with rectal bleeding: Secondary | ICD-10-CM | POA: Diagnosis not present

## 2017-02-22 ENCOUNTER — Other Ambulatory Visit: Payer: Self-pay

## 2017-02-22 ENCOUNTER — Telehealth: Payer: Self-pay

## 2017-02-22 ENCOUNTER — Encounter: Payer: Self-pay | Admitting: Gastroenterology

## 2017-02-22 ENCOUNTER — Ambulatory Visit (INDEPENDENT_AMBULATORY_CARE_PROVIDER_SITE_OTHER): Payer: Managed Care, Other (non HMO) | Admitting: Gastroenterology

## 2017-02-22 DIAGNOSIS — R1310 Dysphagia, unspecified: Secondary | ICD-10-CM

## 2017-02-22 DIAGNOSIS — K582 Mixed irritable bowel syndrome: Secondary | ICD-10-CM | POA: Diagnosis not present

## 2017-02-22 DIAGNOSIS — R109 Unspecified abdominal pain: Secondary | ICD-10-CM

## 2017-02-22 DIAGNOSIS — K2100 Gastro-esophageal reflux disease with esophagitis, without bleeding: Secondary | ICD-10-CM

## 2017-02-22 DIAGNOSIS — K625 Hemorrhage of anus and rectum: Secondary | ICD-10-CM

## 2017-02-22 DIAGNOSIS — D649 Anemia, unspecified: Secondary | ICD-10-CM

## 2017-02-22 DIAGNOSIS — R1319 Other dysphagia: Secondary | ICD-10-CM

## 2017-02-22 MED ORDER — ESOMEPRAZOLE MAGNESIUM 40 MG PO CPDR: DELAYED_RELEASE_CAPSULE | ORAL | 11 refills | Status: DC

## 2017-02-22 NOTE — Telephone Encounter (Signed)
LMOAM and informed pt of pre-op appt 03/16/17 at 9:00am. Letter also mailed.

## 2017-02-22 NOTE — Progress Notes (Signed)
ON RECALL  °

## 2017-02-22 NOTE — Patient Instructions (Signed)
COMPLETE COLONOSCOPY. ON THE DAY PRIOR TO COLONOSCOPY, FOLLOW A FULL LIQUID DIET. DO NOT EAT CHUNKS OF ANYTHING. DO NOT DRINK ANYTHING THAT HAS OIL DROPLETS.  I WILL TRY TO GET NEXIUM. TAKE 30 MINUTES BEFORE MEALS TWICE DAILY. OTHERWISE CONTINUE ZANTAC AT BEDTIME MON-FRI. CARAFATE ON WEEKENDS IF NEEDED.  AVOID REFLUX TRIGGERS. SEE INFO BELOW.  FOLLOW UP IN 6 MOS.    Lifestyle and home remedies TO CONTROL REFLUX  You may eliminate or reduce the frequency of heartburn by making the following lifestyle changes:  . Control your weight. Being overweight is a major risk factor for heartburn and GERD. Excess pounds put pressure on your abdomen, pushing up your stomach and causing acid to back up into your esophagus.   . Eat smaller meals. 4 TO 6 MEALS A DAY. This reduces pressure on the lower esophageal sphincter, helping to prevent the valve from opening and acid from washing back into your esophagus.   Allena Earing your belt. Clothes that fit tightly around your waist put pressure on your abdomen and the lower esophageal sphincter.   . Eliminate heartburn triggers. Everyone has specific triggers. Common triggers such as fatty or fried foods, spicy food, tomato sauce, carbonated beverages, alcohol, chocolate, mint, garlic, onion, caffeine and nicotine may make heartburn worse.   Marland Kitchen Avoid stooping or bending. Tying your shoes is OK. Bending over for longer periods to weed your garden isn't, especially soon after eating.   . Don't lie down after a meal. Wait at least three to four hours after eating before going to bed, and don't lie down right after eating.   Alternative medicine . Several home remedies exist for treating GERD, but they provide only temporary relief. They include drinking baking soda (sodium bicarbonate) added to water or drinking other fluids such as baking soda mixed with cream of tartar and water. . Although these liquids create temporary relief by neutralizing, washing away or  buffering acids, eventually they aggravate the situation by adding gas and fluid to your stomach, increasing pressure and causing more acid reflux. Further, adding more sodium to your diet may increase your blood pressure and add stress to your heart, and excessive bicarbonate ingestion can alter the acid-base balance in your body.

## 2017-02-22 NOTE — Assessment & Plan Note (Addendum)
SYMPTOMS NOT IDEALLY CONTROLLED ON RANITIDINE AND CARAFTAE. PT HAS HISTORY OF ESOPHAGITIS/STRICTURE REQUIRING EGD/DIL. OFF NEXIUM DUE TO LACK OF INSURANCE COVERAGE.  CONTINUE NEXIUM. TAKE 30 MINUTES BEFORE MEALS BID. AVOID REFLUX TRIGGERS.  HANDOUT GIVEN. CONTINUE TO MONITOR SYMPTOMS. FOLLOW UP IN 6 MOS.

## 2017-02-22 NOTE — Assessment & Plan Note (Addendum)
WEIGHT UP 7 LBS. HAD ADVERSE REACTION TO LINZESS NOW BACK ON [name]. SYMPTOMS NOT IDEALLY CONTROLLED-ABDOMINAL PAIN/RECTAL BLEEDING  TCS IN MAY 2018. DRINK WATER EAT A FODMAP DIET CONTINUE [name] FOLLOW UP IN 6 MOS.

## 2017-02-22 NOTE — Progress Notes (Signed)
Subjective:    Patient ID: Yesenia Boyd, female    DOB: 10-09-1956, 61 y.o.   MRN: [id number]  Toma Deiters, MD  HPI HAVING A FLARE FOR ABOUT A MON. SYMPTOMS, RECTAL BLEEDING, ABDOMINAL PAIN. MILD NAUSEA AND REFLUX(REGURGITATION), VOMITING (NO BLOOD). NOW HAVING DIFFICULTY PASSING HER STOOLS. NOW BACK ON [name] FOR PAST 2 WEEKS. HAD SUBJECTIVE CHILLS BEFORE SHE HAS A BM. MAIN THING GOT LEFT WAS HER REFLUX: CUT BACK CARAFATE AND RANITIDINE AS INSTRUCTED. ABDOMINAL PAIN NOW UPPER ABDOMEN. FEELS WHEN  SHE BREATHES AND SOMETIMES IT ACHES. USING CARAFATE ON WEEKENDS.  STILL SORE.  ALLERGY REACTION TO LINZESS-BLISTERS ON HER MOUTH, DIARRHEA AFTER 7-8 DAYS. LAST DOSE: 1 MO AGO. NOW HAS SOB DUE TO ASTHMA.  PT DENIES FEVER, HEMATEMESIS,  melena, CHEST PAIN, CHANGE IN BOWEL IN HABITS, OR problems swallowing.  Past Medical History:  Diagnosis Date  . Anginal pain (HCC)   . Asthma   . Bile reflux gastritis   . Depression    pt believes it's related to multiple sicknesses  . Family history of adverse reaction to anesthesia    sister required ventilatory support after tonsillectomy- told that " in the future need to be very careful with anesth."  . Fibromyalgia    sees Dr Philis Kendall at [facility]  . GERD (gastroesophageal reflux disease)   . History of hiatal hernia    2 surgeries, Lap. Nissen Fundiplication   . IBS (irritable bowel syndrome)   . Magnesium deficiency   . Myasthenia gravis    right sided weakness occ.  . Myasthenia gravis in remission (HCC)   . Neuropathy   . Numbness and tingling in right hand    and whole left side of body-pt sts she has "spells to where she cannot walk or talk"  . Peripheral neuropathy   . S/P colonoscopy April 2009, Sept 2012   normal Dr. Linna Darner, internal hemorrhoids, repeat in Sept 2022  . S/P endoscopy 2008, Aug 2010, Sept 2012   Dr. Linna Darner 2008: removal of impacted food bolus, Dr. Linna Darner 2010: moderate gastritis, Sept 2012 with SLF: path with mild gastritis,  no definite stricture noted, dilation with Savary 16 mm  . Seizures (HCC)    Pt. reports -yes, pt. reports that she blanks out sometimes & the doctors have told her that's like a seizure    . Shortness of breath dyspnea    pt believes it's related to asthma  . Sleep apnea 2010   no cpap used, was told to get oxygen  to use, pt. lnows that she needs new evaluation   . Stroke [facility])    TIA 2010  . TIA (transient ischemic attack) 2012   Past Surgical History:  Procedure Laterality Date  . ABDOMINAL HYSTERECTOMY     complete per patient  . BLADDER SURGERY     stretch bladder opening  . CHOLECYSTECTOMY    . COLONOSCOPY  07/18/2011   SLF: 1. internal hemorrhoids  . ESOPHAGEAL MANOMETRY N/A 01/21/2013   Procedure: ESOPHAGEAL MANOMETRY (EM);  Surgeon: Valarie Merino, MD;  Location: [facility];  Service: General;  Laterality: N/A;  . esophageal surgery?     2008 [facility]  . ESOPHAGOGASTRODUODENOSCOPY  07/18/2011   SLF: 1. Moderate gastritis 2. Dysphagia most likely 2o large food bolus moving through her Nissen, which appears to be intact. Pt has poor denttition and NL BPE 2 years ago. 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  Marland Kitchen EVALUATION UNDER ANESTHESIA  WITH HEMORRHOIDECTOMY N/A 03/27/2015   Procedure: EXAM UNDER ANESTHESIA WITH SPHINCTEROTOMY ;  Surgeon: Luretha MurphyMatthew Martin, MD;  Location: [facility];  Service: General;  Laterality: N/A;  . FLEXIBLE SIGMOIDOSCOPY N/A 12/09/2014   SLF: Rectal pain most likely due to internal and external hemorroids  . HAND SURGERY     carpal tumnnel right and removal of cyst left  . HEMORRHOID BANDING N/A 12/09/2014   Procedure: HEMORRHOID BANDING;  Surgeon: West BaliSandi L Alantra Popoca, MD;  Location: [facility];  Service: Endoscopy;  Laterality: N/A;  . LAPAROSCOPIC NISSEN FUNDOPLICATION N/A 02/13/2013   Procedure: Redo LAPAROSCOPIC NISSEN FUNDOPLICATION;  Surgeon: Valarie MerinoMatthew B Martin, MD;  Location: [facility];  Service: General;  Laterality: N/A;  [place]  explortation with partial  takedown nissan funliplication from 1994 for wrap torsion and persistant dysphagia  . NASAL SEPTOPLASTY W/ TURBINOPLASTY Bilateral 08/03/2016   Procedure: NASAL SEPTOPLASTY WITH TURBINATE REDUCTION;  Surgeon: Newman PiesSu Teoh, MD;  Location: [facility];  Service: ENT;  Laterality: Bilateral;  . NECK SURGERY     removal of "knot" from neck  . NISSEN FUNDOPLICATION     twice  . SAVORY DILATION  07/18/2011   Surgeon:Emory Leaver M Vaness Jelinski   Allergies  Allergen Reactions  . Aspirin Other (See Comments)    Stomach distress, rash  . Avelox [Moxifloxacin Hcl In Nacl] Anaphylaxis and Itching  . Linzess [Linaclotide] Swelling    Swelling, bloating, nausea.Med used for "IBS"  . Adhesive [Tape] Other (See Comments)    Red and itchy under tape  . Carbamazepine Other (See Comments)    Memory loss   . Cephalexin Hives and Itching  . Codeine Hives and Itching  . Cymbalta [Duloxetine Hcl] Other (See Comments)    Hair loss, nervousness, severe constipation, red blotches  . Darvon [Propoxyphene] Hives  . Dicyclomine Itching and Other (See Comments)    Nervousness  . Latex Itching  . Macrolides And Ketolides Swelling  . Meperidine Hcl Itching and Other (See Comments)    Nervousness  . Metronidazole Hives and Other (See Comments)    Red blotches.  . Morphine Itching and Other (See Comments)    Nervousness  . Penicillins Hives  . Pepcid [Famotidine] Nausea And Vomiting  . Prednisone Hives, Swelling and Other (See Comments)    Red blotches - Can take with Benadryl  . Propofol     Depression   . Protonix [Pantoprazole] Swelling  . Sulfonamide Derivatives Hives  . Tetracycline Other (See Comments)    Reaction unknown  . Tramadol Hcl Hives and Other (See Comments)    Nervousness   Current Outpatient Prescriptions  Medication Sig Dispense Refill  . acetaminophen (TYLENOL) 650 MG CR tablet Take 1,300 mg by mouth every 8 (eight) hours as needed for pain.     Marland Kitchen. albuterol (PROVENTIL  HFA;VENTOLIN HFA) 108 (90 Base) MCG/ACT inhaler Inhale 1 puff into the lungs every 6 (six) hours as needed for wheezing or shortness of breath.    Marland Kitchen. CARAFATE 1 GM/10ML suspension Take 1 g by mouth 4 (four) times daily.     . Cholecalciferol ([employer] VIT D 5000 HIGH-POTENCY) 5000 UNITS capsule Take 10,000 Units by mouth every morning.     Marland Kitchen. EPINEPHrine 0.3 mg/0.3 mL IJ SOAJ injection Inject 0.3 mg into the muscle as needed (allergic reaction).    Marland Kitchen. estrogens, conjugated, (PREMARIN) 0.625 MG tablet Take 0.625 mg by mouth at bedtime.     . hydrOXYzine (VISTARIL) 25 MG capsule Take 25 mg by mouth at bedtime.     .Marland Kitchen  L-Methylfolate-B6-B12 (METANX PO) Take 1 tablet by mouth at bedtime.     Marland Kitchen loratadine (CLARITIN) 10 MG tablet Take 10 mg by mouth daily.     Marland Kitchen lubiprostone ([name]) 24 MCG capsule Take 24 mcg by mouth 2 (two) times daily with a meal.      . magnesium citrate solution Take 296 mLs by mouth daily as needed. For blockage    . montelukast (SINGULAIR) 10 MG tablet Take 10 mg by mouth at bedtime.    . nitroGLYCERIN (NITROSTAT) 0.4 MG SL tablet Place 0.4 mg under the tongue every 5 (five) minutes as needed for chest pain.    . ranitidine (ZANTAC) 300 MG tablet Take 300 mg by mouth at bedtime.    . simethicone (MYLICON) 125 MG chewable tablet Chew 125 mg by mouth every 6 (six) hours as needed for flatulence.    Marland Kitchen tiZANidine (ZANAFLEX) 4 MG tablet Take 4 mg by mouth at bedtime.     . triamcinolone (KENALOG) 0.1 % cream Apply 1 application topically 2 (two) times daily as needed (For hair). For skin.    Marland Kitchen vitamin E 1000 UNIT capsule Take 1,000 Units by mouth daily.    Marland Kitchen    0   Review of Systems PER HPI OTHERWISE ALL SYSTEMS ARE NEGATIVE.    Objective:   Physical Exam  Constitutional: She is oriented to person, place, and time. She appears well-developed and well-nourished. No distress.  HENT:  Head: Normocephalic and atraumatic.  Mouth/Throat: Oropharynx is clear and moist. No oropharyngeal  exudate.  Eyes: Pupils are equal, round, and reactive to light. No scleral icterus.  Neck: Normal range of motion. Neck supple.  Cardiovascular: Normal rate, regular rhythm and normal heart sounds.   Pulmonary/Chest: Effort normal and breath sounds normal. No respiratory distress.  Abdominal: Soft. Bowel sounds are normal. She exhibits no distension. There is tenderness. There is no rebound.  MILD PERI-UMBILICAL TTP  Musculoskeletal: She exhibits no edema.  Lymphadenopathy:    She has no cervical adenopathy.  Neurological: She is alert and oriented to person, place, and time.  NO FOCAL DEFICITS  Psychiatric: She has a normal mood and affect.  Vitals reviewed.      Assessment & Plan:

## 2017-02-22 NOTE — Assessment & Plan Note (Signed)
SYMPTOMS NOT IDEALLY CONTROLLED 7 ASSOCIATED WITH NORMOCYTIC ANEMIA. PMHX: HEMORRHODS BUT LAST TCS 2012. PT HAS HAD HEMORRHOID BANDING. DIFFERENTIAL DIAGNOSIS INCLUDES HEMORRHOIDS, COLON POLYPS, AVMs, & LESS LIKELY COLON CA.  TCS MAY 2018 W/ MAC DUE TO POLYPHARMACY. DISCUSSED PROCEDURE, BENEFITS, & RISKS: < 1% chance of medication reaction, bleeding, perforation, or rupture of spleen/liver. IF NO OTHER SOURCE FOR BLEEDING FOUND, PT WILL CONTINUE MEDICAL MANAGEMENT OF HER HEMORRHOIDS OR CONSIDER SURGERY. SHE DECLINES SURGERY REFERRAL AT THIS TIME.

## 2017-02-22 NOTE — Progress Notes (Signed)
cc'ed to pcp °

## 2017-02-22 NOTE — Assessment & Plan Note (Signed)
SYMPTOMS FAIRLY WELL CONTROLLED.  CONTINUE TO MONITOR SYMPTOMS. 

## 2017-03-14 NOTE — Patient Instructions (Signed)
Yesenia SonsStella L Grefe  03/14/2017     @PREFPERIOPPHARMACY @   Your procedure is scheduled on  03/21/2017 .  [facility] to [facility] at  930  A.M.  Call this number if you have problems the morning of surgery:  [phone]   Remember:  Do not eat food or drink liquids after midnight.  Take these medicines the morning of surgery with A SIP OF WATER  nexium, claritin.   Do not wear jewelry, make-up or nail polish.  Do not wear lotions, powders, or perfumes, or deoderant.  Do not shave 48 hours prior to surgery.  Men may shave face and neck.  Do not bring valuables to the hospital.  [facility] any belongings or valuables.  Contacts, dentures or bridgework may not be worn into surgery.  Leave your suitcase in the car.  After surgery it may be brought to your room.  For patients admitted to the hospital, discharge time will be determined by your treatment team.  Patients discharged the day of surgery will not be allowed to drive home.   Name and phone number of your driver:   family Special instructions:  Follow the diet and prep instructions given to you by Dr Evelina DunField's office.  Please read over the following fact sheets that you were given. Anesthesia Post-op Instructions and Care and Recovery After Surgery       Colonoscopy, Adult A colonoscopy is an exam to look at the entire large intestine. During the exam, a lubricated, bendable tube is inserted into the anus and then passed into the rectum, colon, and other parts of the large intestine. A colonoscopy is often done as a part of normal colorectal screening or in response to certain symptoms, such as anemia, persistent diarrhea, abdominal pain, and blood in the stool. The exam can help screen for and diagnose medical problems, including:  Tumors.  Polyps.  Inflammation.  Areas of bleeding. Tell a health care provider about:  Any allergies you have.  All medicines you are taking, including  vitamins, herbs, eye drops, creams, and over-the-counter medicines.  Any problems you or family members have had with anesthetic medicines.  Any blood disorders you have.  Any surgeries you have had.  Any medical conditions you have.  Any problems you have had passing stool. What are the risks? Generally, this is a safe procedure. However, problems may occur, including:  Bleeding.  A tear in the intestine.  A reaction to medicines given during the exam.  Infection (rare). What happens before the procedure? Eating and drinking restrictions  Follow instructions from your health care provider about eating and drinking, which may include:  A few days before the procedure - follow a low-fiber diet. Avoid nuts, seeds, dried fruit, raw fruits, and vegetables.  1-3 days before the procedure - follow a clear liquid diet. Drink only clear liquids, such as clear broth or bouillon, black coffee or tea, clear juice, clear soft drinks or sports drinks, gelatin dessert, and popsicles. Avoid any liquids that contain red or purple dye.  On the day of the procedure - do not eat or drink anything during the 2 hours before the procedure, or within the time period that your health care provider recommends. Bowel prep  If you were prescribed an oral bowel prep to clean out your colon:  Take it as told by your health care provider. Starting the day before your procedure, you will  need to drink a large amount of medicated liquid. The liquid will cause you to have multiple loose stools until your stool is almost clear or light green.  If your skin or anus gets irritated from diarrhea, you may use these to relieve the irritation:  Medicated wipes, such as adult wet wipes with aloe and vitamin E.  A skin soothing-product like petroleum jelly.  If you vomit while drinking the bowel prep, take a break for up to 60 minutes and then begin the bowel prep again. If vomiting continues and you cannot take the  bowel prep without vomiting, call your health care provider. General instructions   Ask your health care provider about changing or stopping your regular medicines. This is especially important if you are taking diabetes medicines or blood thinners.  Plan to have someone take you home from the hospital or clinic. What happens during the procedure?  An IV tube may be inserted into one of your veins.  You will be given medicine to help you relax (sedative).  To reduce your risk of infection:  Your health care team will wash or sanitize their hands.  Your anal area will be washed with soap.  You will be asked to lie on your side with your knees bent.  Your health care provider will lubricate a long, thin, flexible tube. The tube will have a camera and a light on the end.  The tube will be inserted into your anus.  The tube will be gently eased through your rectum and colon.  Air will be delivered into your colon to keep it open. You may feel some pressure or cramping.  The camera will be used to take images during the procedure.  A small tissue sample may be removed from your body to be examined under a microscope (biopsy). If any potential problems are found, the tissue will be sent to a lab for testing.  If small polyps are found, your health care provider may remove them and have them checked for cancer cells.  The tube that was inserted into your anus will be slowly removed. The procedure may vary among health care providers and hospitals. What happens after the procedure?  Your blood pressure, heart rate, breathing rate, and blood oxygen level will be monitored until the medicines you were given have worn off.  Do not drive for 24 hours after the exam.  You may have a small amount of blood in your stool.  You may pass gas and have mild abdominal cramping or bloating due to the air that was used to inflate your colon during the exam.  It is up to you to get the results  of your procedure. Ask your health care provider, or the department performing the procedure, when your results will be ready. This information is not intended to replace advice given to you by your health care provider. Make sure you discuss any questions you have with your health care provider. Document Released: 10/21/2000 Document Revised: 08/24/2016 Document Reviewed: 01/05/2016 Elsevier Interactive Patient Education  2017 [employer]  Colonoscopy, Adult, Care After This sheet gives you information about how to care for yourself after your procedure. Your health care provider may also give you more specific instructions. If you have problems or questions, contact your health care provider. What can I expect after the procedure? After the procedure, it is common to have:  A small amount of blood in your stool for 24 hours after the procedure.  Some gas.  Mild abdominal cramping or bloating. Follow these instructions at home: General instructions    For the first 24 hours after the procedure:  Do not drive or use machinery.  Do not sign important documents.  Do not drink alcohol.  Do your regular daily activities at a slower pace than normal.  Eat soft, easy-to-digest foods.  Rest often.  Take over-the-counter or prescription medicines only as told by your health care provider.  It is up to you to get the results of your procedure. Ask your health care provider, or the department performing the procedure, when your results will be ready. Relieving cramping and bloating   Try walking around when you have cramps or feel bloated.  Apply heat to your abdomen as told by your health care provider. Use a heat source that your health care provider recommends, such as a moist heat pack or a heating pad.  Place a towel between your skin and the heat source.  Leave the heat on for 20-30 minutes.  Remove the heat if your skin turns bright red. This is especially important if  you are unable to feel pain, heat, or cold. You may have a greater risk of getting burned. Eating and drinking   Drink enough fluid to keep your urine clear or pale yellow.  Resume your normal diet as instructed by your health care provider. Avoid heavy or fried foods that are hard to digest.  Avoid drinking alcohol for as long as instructed by your health care provider. Contact a health care provider if:  You have blood in your stool 2-3 days after the procedure. Get help right away if:  You have more than a small spotting of blood in your stool.  You pass large blood clots in your stool.  Your abdomen is swollen.  You have nausea or vomiting.  You have a fever.  You have increasing abdominal pain that is not relieved with medicine. This information is not intended to replace advice given to you by your health care provider. Make sure you discuss any questions you have with your health care provider. Document Released: 06/07/2004 Document Revised: 07/18/2016 Document Reviewed: 01/05/2016 Elsevier Interactive Patient Education  2017 [place] Anesthesia is a term that refers to techniques, procedures, and medicines that help a person stay safe and comfortable during a medical procedure. Monitored anesthesia care, or sedation, is one type of anesthesia. Your anesthesia specialist may recommend sedation if you will be having a procedure that does not require you to be unconscious, such as:  Cataract surgery.  A dental procedure.  A biopsy.  A colonoscopy. During the procedure, you may receive a medicine to help you relax (sedative). There are three levels of sedation:  Mild sedation. At this level, you may feel awake and relaxed. You will be able to follow directions.  Moderate sedation. At this level, you will be sleepy. You may not remember the procedure.  Deep sedation. At this level, you will be asleep. You will not remember the  procedure. The more medicine you are given, the deeper your level of sedation will be. Depending on how you respond to the procedure, the anesthesia specialist may change your level of sedation or the type of anesthesia to fit your needs. An anesthesia specialist will monitor you closely during the procedure. Let your health care provider know about:  Any allergies you have.  All medicines you are taking, including vitamins, herbs, eye drops, creams, and over-the-counter medicines.  Any use of steroids (by mouth or as a cream).  Any problems you or family members have had with sedatives and anesthetic medicines.  Any blood disorders you have.  Any surgeries you have had.  Any medical conditions you have, such as sleep apnea.  Whether you are pregnant or may be pregnant.  Any use of cigarettes, alcohol, or street drugs. What are the risks? Generally, this is a safe procedure. However, problems may occur, including:  Getting too much medicine (oversedation).  Nausea.  Allergic reaction to medicines.  Trouble breathing. If this happens, a breathing tube may be used to help with breathing. It will be removed when you are awake and breathing on your own.  Heart trouble.  Lung trouble. Before the procedure Staying hydrated  Follow instructions from your health care provider about hydration, which may include:  Up to 2 hours before the procedure - you may continue to drink clear liquids, such as water, clear fruit juice, black coffee, and plain tea. Eating and drinking restrictions  Follow instructions from your health care provider about eating and drinking, which may include:  8 hours before the procedure - stop eating heavy meals or foods such as meat, fried foods, or fatty foods.  6 hours before the procedure - stop eating light meals or foods, such as toast or cereal.  6 hours before the procedure - stop drinking milk or drinks that contain milk.  2 hours before the  procedure - stop drinking clear liquids. Medicines  Ask your health care provider about:  Changing or stopping your regular medicines. This is especially important if you are taking diabetes medicines or blood thinners.  Taking medicines such as aspirin and ibuprofen. These medicines can thin your blood. Do not take these medicines before your procedure if your health care provider instructs you not to. Tests and exams  You will have a physical exam.  You may have blood tests done to show:  How well your kidneys and liver are working.  How well your blood can clot.  General instructions  Plan to have someone take you home from the hospital or clinic.  If you will be going home right after the procedure, plan to have someone with you for 24 hours. What happens during the procedure?  Your blood pressure, heart rate, breathing, level of pain and overall condition will be monitored.  An IV tube will be inserted into one of your veins.  Your anesthesia specialist will give you medicines as needed to keep you comfortable during the procedure. This may mean changing the level of sedation.  The procedure will be performed. After the procedure  Your blood pressure, heart rate, breathing rate, and blood oxygen level will be monitored until the medicines you were given have worn off.  Do not drive for 24 hours if you received a sedative.  You may:  Feel sleepy, clumsy, or nauseous.  Feel forgetful about what happened after the procedure.  Have a sore throat if you had a breathing tube during the procedure.  Vomit. This information is not intended to replace advice given to you by your health care provider. Make sure you discuss any questions you have with your health care provider. Document Released: 07/20/2005 Document Revised: 04/01/2016 Document Reviewed: 02/14/2016 Elsevier Interactive Patient Education  2017 [place], Care After These  instructions provide you with information about caring for yourself after your procedure. Your health care provider may also give you more specific instructions.  Your treatment has been planned according to current medical practices, but problems sometimes occur. Call your health care provider if you have any problems or questions after your procedure. What can I expect after the procedure? After your procedure, it is common to:  Feel sleepy for several hours.  Feel clumsy and have poor balance for several hours.  Feel forgetful about what happened after the procedure.  Have poor judgment for several hours.  Feel nauseous or vomit.  Have a sore throat if you had a breathing tube during the procedure. Follow these instructions at home: For at least 24 hours after the procedure:    Do not:  Participate in activities in which you could fall or become injured.  Drive.  Use heavy machinery.  Drink alcohol.  Take sleeping pills or medicines that cause drowsiness.  Make important decisions or sign legal documents.  Take care of children on your own.  Rest. Eating and drinking   Follow the diet that is recommended by your health care provider.  If you vomit, drink water, juice, or soup when you can drink without vomiting.  Make sure you have little or no nausea before eating solid foods. General instructions   Have a responsible adult stay with you until you are awake and alert.  Take over-the-counter and prescription medicines only as told by your health care provider.  If you smoke, do not smoke without supervision.  Keep all follow-up visits as told by your health care provider. This is important. Contact a health care provider if:  You keep feeling nauseous or you keep vomiting.  You feel light-headed.  You develop a rash.  You have a fever. Get help right away if:  You have trouble breathing. This information is not intended to replace advice given to you  by your health care provider. Make sure you discuss any questions you have with your health care provider. Document Released: 02/14/2016 Document Revised: 06/15/2016 Document Reviewed: 02/14/2016 Elsevier Interactive Patient Education  2017 [employer].

## 2017-03-16 ENCOUNTER — Encounter (HOSPITAL_COMMUNITY): Payer: Self-pay

## 2017-03-16 ENCOUNTER — Encounter (HOSPITAL_COMMUNITY)
Admission: RE | Admit: 2017-03-16 | Discharge: 2017-03-16 | Disposition: A | Discharge status: Home / Self Care | Payer: Managed Care, Other (non HMO) | Source: Ambulatory Visit | Attending: Gastroenterology | Admitting: Gastroenterology

## 2017-03-16 DIAGNOSIS — Z01812 Encounter for preprocedural laboratory examination: Secondary | ICD-10-CM | POA: Insufficient documentation

## 2017-03-16 DIAGNOSIS — Z01810 Encounter for preprocedural cardiovascular examination: Secondary | ICD-10-CM | POA: Insufficient documentation

## 2017-03-16 DIAGNOSIS — F329 Major depressive disorder, single episode, unspecified: Secondary | ICD-10-CM | POA: Insufficient documentation

## 2017-03-16 HISTORY — DX: Other complications of anesthesia, initial encounter: T88.59XA

## 2017-03-16 HISTORY — DX: Adverse effect of unspecified anesthetic, initial encounter: T41.45XA

## 2017-03-16 LAB — BASIC METABOLIC PANEL
Anion gap: 6 (ref 5–15)
BUN: 12 mg/dL (ref 6–20)
CO2: 28 mmol/L (ref 22–32)
Calcium: 8.8 mg/dL — ABNORMAL LOW (ref 8.9–10.3)
Chloride: 103 mmol/L (ref 101–111)
Creatinine, Ser: 0.81 mg/dL (ref 0.44–1.00)
GFR calc Af Amer: >60 mL/min (ref >60)
GFR calc non Af Amer: >60 mL/min (ref >60)
Glucose, Bld: 98 mg/dL (ref 65–99)
Potassium: 3.7 mmol/L (ref 3.5–5.1)
Sodium: 137 mmol/L (ref 135–145)

## 2017-03-16 LAB — CBC WITH DIFFERENTIAL/PLATELET
Basophils Absolute: 0 10*3/uL (ref 0.0–0.1)
Basophils Relative: 1 %
Eosinophils Absolute: 0.1 10*3/uL (ref 0.0–0.7)
Eosinophils Relative: 2 %
HCT: 30.7 % — ABNORMAL LOW (ref 36.0–46.0)
Hemoglobin: 10.7 g/dL — ABNORMAL LOW (ref 12.0–15.0)
Lymphocytes Relative: 51 %
Lymphs Abs: 2 10*3/uL (ref 0.7–4.0)
MCH: 27.8 pg (ref 26.0–34.0)
MCHC: 34.9 g/dL (ref 30.0–36.0)
MCV: 79.7 fL (ref 78.0–100.0)
Monocytes Absolute: 0.3 10*3/uL (ref 0.1–1.0)
Monocytes Relative: 9 %
Neutro Abs: 1.4 10*3/uL — ABNORMAL LOW (ref 1.7–7.7)
Neutrophils Relative %: 37 %
Platelets: 229 10*3/uL (ref 150–400)
RBC: 3.85 MIL/uL — ABNORMAL LOW (ref 3.87–5.11)
RDW: 12.8 % (ref 11.5–15.5)
WBC: 3.8 10*3/uL — ABNORMAL LOW (ref 4.0–10.5)

## 2017-03-16 NOTE — Progress Notes (Signed)
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  After speaking with Dr Darrick PennaFields, she states that propofol is the only medication that can be used for her and if she was not comfortable with proceeding using propofol, the procedure would have to be cancelled.  Contacted patient who informed me that she was not, at all comfortable with the use of propofol, however wants to know the source of her bleeding and would "take her chances".  Dr Jayme CloudGonzalez consulted and states that he will not use propofol on her due to the fact she has concerns, and feels that she has had reactions in the past, however would be glad to proceed in the OR with Anesthesia, using other agents. Touched base with Dr Darrick PennaFields, making her aware of the above discussion and she is in agree ance with the plan.  Patient made aware and states that she is much more comfortable.

## 2017-03-20 DIAGNOSIS — K219 Gastro-esophageal reflux disease without esophagitis: Secondary | ICD-10-CM | POA: Diagnosis not present

## 2017-03-20 DIAGNOSIS — F458 Other somatoform disorders: Secondary | ICD-10-CM | POA: Diagnosis not present

## 2017-03-20 DIAGNOSIS — Z6826 Body mass index (BMI) 26.0-26.9, adult: Secondary | ICD-10-CM | POA: Diagnosis not present

## 2017-03-21 ENCOUNTER — Ambulatory Visit (HOSPITAL_COMMUNITY): Payer: Managed Care, Other (non HMO) | Admitting: Anesthesiology

## 2017-03-21 ENCOUNTER — Encounter (HOSPITAL_COMMUNITY)
Admission: RE | Disposition: A | Discharge status: Home / Self Care | Payer: Self-pay | Source: Ambulatory Visit | Attending: Gastroenterology

## 2017-03-21 ENCOUNTER — Ambulatory Visit (HOSPITAL_COMMUNITY)
Admission: RE | Admit: 2017-03-21 | Discharge: 2017-03-21 | Disposition: A | Discharge status: Home / Self Care | Payer: Managed Care, Other (non HMO) | Source: Ambulatory Visit | Attending: Gastroenterology | Admitting: Gastroenterology

## 2017-03-21 ENCOUNTER — Encounter (HOSPITAL_COMMUNITY): Payer: Self-pay | Admitting: *Deleted

## 2017-03-21 DIAGNOSIS — D649 Anemia, unspecified: Secondary | ICD-10-CM

## 2017-03-21 DIAGNOSIS — K219 Gastro-esophageal reflux disease without esophagitis: Secondary | ICD-10-CM | POA: Diagnosis not present

## 2017-03-21 DIAGNOSIS — Z885 Allergy status to narcotic agent status: Secondary | ICD-10-CM | POA: Diagnosis not present

## 2017-03-21 DIAGNOSIS — R109 Unspecified abdominal pain: Secondary | ICD-10-CM

## 2017-03-21 DIAGNOSIS — J45909 Unspecified asthma, uncomplicated: Secondary | ICD-10-CM | POA: Insufficient documentation

## 2017-03-21 DIAGNOSIS — Z8673 Personal history of transient ischemic attack (TIA), and cerebral infarction without residual deficits: Secondary | ICD-10-CM | POA: Diagnosis not present

## 2017-03-21 DIAGNOSIS — K648 Other hemorrhoids: Secondary | ICD-10-CM | POA: Diagnosis not present

## 2017-03-21 DIAGNOSIS — M797 Fibromyalgia: Secondary | ICD-10-CM | POA: Diagnosis not present

## 2017-03-21 DIAGNOSIS — Z886 Allergy status to analgesic agent status: Secondary | ICD-10-CM | POA: Insufficient documentation

## 2017-03-21 DIAGNOSIS — Z79899 Other long term (current) drug therapy: Secondary | ICD-10-CM | POA: Diagnosis not present

## 2017-03-21 DIAGNOSIS — G7000 Myasthenia gravis without (acute) exacerbation: Secondary | ICD-10-CM | POA: Insufficient documentation

## 2017-03-21 DIAGNOSIS — G4730 Sleep apnea, unspecified: Secondary | ICD-10-CM | POA: Diagnosis not present

## 2017-03-21 DIAGNOSIS — F329 Major depressive disorder, single episode, unspecified: Secondary | ICD-10-CM | POA: Diagnosis not present

## 2017-03-21 DIAGNOSIS — G629 Polyneuropathy, unspecified: Secondary | ICD-10-CM | POA: Insufficient documentation

## 2017-03-21 DIAGNOSIS — K580 Irritable bowel syndrome with diarrhea: Secondary | ICD-10-CM | POA: Insufficient documentation

## 2017-03-21 DIAGNOSIS — Z91040 Latex allergy status: Secondary | ICD-10-CM | POA: Insufficient documentation

## 2017-03-21 DIAGNOSIS — Q438 Other specified congenital malformations of intestine: Secondary | ICD-10-CM | POA: Diagnosis not present

## 2017-03-21 DIAGNOSIS — Z888 Allergy status to other drugs, medicaments and biological substances status: Secondary | ICD-10-CM | POA: Insufficient documentation

## 2017-03-21 DIAGNOSIS — Z882 Allergy status to sulfonamides status: Secondary | ICD-10-CM | POA: Insufficient documentation

## 2017-03-21 DIAGNOSIS — Z880 Allergy status to penicillin: Secondary | ICD-10-CM | POA: Diagnosis not present

## 2017-03-21 DIAGNOSIS — K625 Hemorrhage of anus and rectum: Secondary | ICD-10-CM | POA: Diagnosis not present

## 2017-03-21 DIAGNOSIS — K644 Residual hemorrhoidal skin tags: Secondary | ICD-10-CM | POA: Diagnosis not present

## 2017-03-21 DIAGNOSIS — K921 Melena: Secondary | ICD-10-CM

## 2017-03-21 DIAGNOSIS — Z79890 Hormone replacement therapy (postmenopausal): Secondary | ICD-10-CM | POA: Diagnosis not present

## 2017-03-21 HISTORY — PX: COLONOSCOPY WITH PROPOFOL: SHX5780

## 2017-03-21 SURGERY — COLONOSCOPY WITH PROPOFOL
Anesthesia: Monitor Anesthesia Care

## 2017-03-21 MED ORDER — FENTANYL CITRATE (PF) 100 MCG/2ML IJ SOLN
INTRAMUSCULAR | Status: AC
  Filled 2017-03-21: qty 2

## 2017-03-21 MED ORDER — LACTATED RINGERS IV SOLN
INTRAVENOUS | Status: DC
  Administered 2017-03-21: 10:00:00 via INTRAVENOUS

## 2017-03-21 MED ORDER — MIDAZOLAM HCL 2 MG/2ML IJ SOLN
1.0000 mg | INTRAMUSCULAR | Status: AC | PRN
  Administered 2017-03-21 (×2): 1 mg via INTRAVENOUS
  Filled 2017-03-21: qty 2

## 2017-03-21 MED ORDER — IPRATROPIUM-ALBUTEROL 0.5-2.5 (3) MG/3ML IN SOLN
RESPIRATORY_TRACT | Status: AC
  Filled 2017-03-21: qty 3

## 2017-03-21 MED ORDER — PROPOFOL 500 MG/50ML IV EMUL
INTRAVENOUS | Status: DC | PRN
  Administered 2017-03-21: 150 ug/kg/min via INTRAVENOUS
  Administered 2017-03-21: 11:00:00 via INTRAVENOUS

## 2017-03-21 MED ORDER — PROPOFOL 10 MG/ML IV BOLUS
INTRAVENOUS | Status: AC
  Filled 2017-03-21: qty 40

## 2017-03-21 MED ORDER — IPRATROPIUM-ALBUTEROL 0.5-2.5 (3) MG/3ML IN SOLN
3.0000 mL | Freq: Once | RESPIRATORY_TRACT | Status: AC
  Administered 2017-03-21: 3 mL via RESPIRATORY_TRACT

## 2017-03-21 MED ORDER — CHLORHEXIDINE GLUCONATE CLOTH 2 % EX PADS: 6.0000 | MEDICATED_PAD | Freq: Once | CUTANEOUS | Status: DC

## 2017-03-21 MED ORDER — PROPOFOL 10 MG/ML IV BOLUS
INTRAVENOUS | Status: AC
  Filled 2017-03-21: qty 20

## 2017-03-21 MED ORDER — FENTANYL CITRATE (PF) 100 MCG/2ML IJ SOLN
25.0000 ug | INTRAMUSCULAR | Status: DC | PRN
  Administered 2017-03-21: 25 ug via INTRAVENOUS

## 2017-03-21 NOTE — Discharge Instructions (Signed)
YOU DID NOT HAVE ANY POLYPS.  You have MODERATE Internal, WHICH IS THE CAUSE FOR YOUR RECTAL BLEEDING. YOU ALSO HAVE MODERATE SIZE EXTERNAL hemorrhoids.   DRINK WATER TO KEEP YOUR URINE LIGHT YELLOW.  FOLLOW A HIGH FIBER DIET. AVOID ITEMS THAT CAUSE BLOATING & GAS. SEE INFO BELOW.  USE PREPARATION H FOUR TIMES  A DAY IF NEEDED TO RELIEVE RECTAL PAIN/PRESSURE/BLEEDING. SEE SURGERY TO FIX YOUR HEMORRHOIDS WHEN YOU ARE READY.Marland Kitchen  Next colonoscopy in 10 years.   Colonoscopy Care After Read the instructions outlined below and refer to this sheet in the next week. These discharge instructions provide you with general information on caring for yourself after you leave the hospital. While your treatment has been planned according to the most current medical practices available, unavoidable complications occasionally occur. If you have any problems or questions after discharge, call DR. Rhona Fusilier, [phone].  ACTIVITY  You may resume your regular activity, but move at a slower pace for the next 24 hours.   Take frequent rest periods for the next 24 hours.   Walking will help get rid of the air and reduce the bloated feeling in your belly (abdomen).   No driving for 24 hours (because of the medicine (anesthesia) used during the test).   You may shower.   Do not sign any important legal documents or operate any machinery for 24 hours (because of the anesthesia used during the test).    NUTRITION  Drink plenty of fluids.   You may resume your normal diet as instructed by your doctor.   Begin with a light meal and progress to your normal diet. Heavy or fried foods are harder to digest and may make you feel sick to your stomach (nauseated).   Avoid alcoholic beverages for 24 hours or as instructed.    MEDICATIONS  You may resume your normal medications.   WHAT YOU CAN EXPECT TODAY  Some feelings of bloating in the abdomen.   Passage of more gas than usual.   Spotting of blood in  your stool or on the toilet paper  .  IF YOU HAD POLYPS REMOVED DURING THE COLONOSCOPY:  Eat a soft diet IF YOU HAVE NAUSEA, BLOATING, ABDOMINAL PAIN, OR VOMITING.    FINDING OUT THE RESULTS OF YOUR TEST Not all test results are available during your visit. DR. Darrick Penna WILL CALL YOU WITHIN 14 DAYS OF YOUR PROCEDUE WITH YOUR RESULTS. Do not assume everything is normal if you have not heard from DR. Tamarion Haymond, CALL HER OFFICE AT [phone].  SEEK IMMEDIATE MEDICAL ATTENTION AND CALL THE OFFICE: [phone] IF:  You have more than a spotting of blood in your stool.   Your belly is swollen (abdominal distention).   You are nauseated or vomiting.   You have a temperature over 101F.   You have abdominal pain or discomfort that is severe or gets worse throughout the day.   High-Fiber Diet A high-fiber diet changes your normal diet to include more whole grains, legumes, fruits, and vegetables. Changes in the diet involve replacing refined carbohydrates with unrefined foods. The calorie level of the diet is essentially unchanged. The Dietary Reference Intake (recommended amount) for adult males is 38 grams per day. For adult females, it is 25 grams per day. Pregnant and lactating women should consume 28 grams of fiber per day. Fiber is the intact part of a plant that is not broken down during digestion. Functional fiber is fiber that has been isolated from the plant to provide  a beneficial effect in the body. PURPOSE  Increase stool bulk.   Ease and regulate bowel movements.   Lower cholesterol.   REDUCE RISK OF COLON CANCER  INDICATIONS THAT YOU NEED MORE FIBER  Constipation and hemorrhoids.   Uncomplicated diverticulosis (intestine condition) and irritable bowel syndrome.   Weight management.   As a protective measure against hardening of the arteries (atherosclerosis), diabetes, and cancer.   GUIDELINES FOR INCREASING FIBER IN THE DIET  Start adding fiber to the diet slowly.  A gradual increase of about 5 more grams (2 slices of whole-wheat bread, 2 servings of most fruits or vegetables, or 1 bowl of high-fiber cereal) per day is best. Too rapid an increase in fiber may result in constipation, flatulence, and bloating.   Drink enough water and fluids to keep your urine clear or pale yellow. Water, juice, or caffeine-free drinks are recommended. Not drinking enough fluid may cause constipation.   Eat a variety of high-fiber foods rather than one type of fiber.   Try to increase your intake of fiber through using high-fiber foods rather than fiber pills or supplements that contain small amounts of fiber.   The goal is to change the types of food eaten. Do not supplement your present diet with high-fiber foods, but replace foods in your present diet.   INCLUDE A VARIETY OF FIBER SOURCES  Replace refined and processed grains with whole grains, canned fruits with fresh fruits, and incorporate other fiber sources. White rice, white breads, and most bakery goods contain little or no fiber.   Brown whole-grain rice, buckwheat oats, and many fruits and vegetables are all good sources of fiber. These include: broccoli, Brussels sprouts, cabbage, cauliflower, beets, sweet potatoes, white potatoes (skin on), carrots, tomatoes, eggplant, squash, berries, fresh fruits, and dried fruits.   Cereals appear to be the richest source of fiber. Cereal fiber is found in whole grains and bran. Bran is the fiber-rich outer coat of cereal grain, which is largely removed in refining. In whole-grain cereals, the bran remains. In breakfast cereals, the largest amount of fiber is found in those with "bran" in their names. The fiber content is sometimes indicated on the label.   You may need to include additional fruits and vegetables each day.   In baking, for 1 cup white flour, you may use the following substitutions:   1 cup whole-wheat flour minus 2 tablespoons.   1/2 cup white flour plus  1/2 cup whole-wheat flour.   Hemorrhoids Hemorrhoids are dilated (enlarged) veins around the rectum. Sometimes clots will form in the veins. This makes them swollen and painful. These are called thrombosed hemorrhoids. Causes of hemorrhoids include:  Constipation.   Straining to have a bowel movement.   HEAVY LIFTING  HOME CARE INSTRUCTIONS  Eat a well balanced diet and drink 6 to 8 glasses of water every day to avoid constipation. You may also use a bulk laxative.   Avoid straining to have bowel movements.   Keep anal area dry and clean.   Do not use a donut shaped pillow or sit on the toilet for long periods. This increases blood pooling and pain.   Move your bowels when your body has the urge; this will require less straining and will decrease pain and pressure.

## 2017-03-21 NOTE — Transfer of Care (Signed)
Immediate Anesthesia Transfer of Care Note  Patient: Yesenia Boyd  Procedure(s) Performed: Procedure(s) with comments: COLONOSCOPY WITH CHOICE ANESTHESIA (N/A) - 10:45am  Patient Location: PACU  Anesthesia Type:MAC  Level of Consciousness: awake, oriented and patient cooperative  Airway & Oxygen Therapy: Patient Spontanous Breathing and Patient connected to nasal cannula oxygen  Post-op Assessment: Report given to RN and Post -op Vital signs reviewed and stable  Post vital signs: Reviewed and stable  Last Vitals:  Vitals:   03/21/17 1040 03/21/17 1045  BP:    Pulse:    Resp: 17 20  Temp:      Last Pain:  Vitals:   03/21/17 0958  TempSrc: Oral  PainSc: 5       Patients Stated Pain Goal: 7 (37/04/88 [id number])  Complications: No apparent anesthesia complications

## 2017-03-21 NOTE — Anesthesia Preprocedure Evaluation (Signed)
Anesthesia Evaluation  Patient identified by MRN, date of birth, ID band Patient awake    Reviewed: Allergy & Precautions, H&P , NPO status , Patient's Chart, lab work & pertinent test results  History of Anesthesia Complications (+) Family history of anesthesia reaction and history of anesthetic complications (difficulty with conscious sedation)  Airway Mallampati: II  TM Distance: >3 FB Neck ROM: Full    Dental  (+) Missing, Dental Advisory Given,  Both sides upper all missing. Only 4 center teeth present.:   Pulmonary shortness of breath and with exertion, asthma , sleep apnea ,    Pulmonary exam normal breath sounds clear to auscultation       Cardiovascular Exercise Tolerance: Good + angina Normal cardiovascular exam Rhythm:Regular Rate:Normal     Neuro/Psych Seizures -,  PSYCHIATRIC DISORDERS Depression Myasthenia Gravis. No medicines for MG currently, but does sometimes have symptoms of weakness, difficulty swallowing. TIA 2012 TIA Neuromuscular disease CVA, No Residual Symptoms    GI/Hepatic Neg liver ROS, hiatal hernia, GERD  Medicated,  Endo/Other  negative endocrine ROS  Renal/GU negative Renal ROS  negative genitourinary   Musculoskeletal  (+) Fibromyalgia -  Abdominal   Peds negative pediatric ROS (+)  Hematology negative hematology ROS (+)   Anesthesia Other Findings   Reproductive/Obstetrics negative OB ROS                             Anesthesia Physical Anesthesia Plan  ASA: III  Anesthesia Plan: MAC   Post-op Pain Management:    Induction: Intravenous  Airway Management Planned: Simple Face Mask  Additional Equipment:   Intra-op Plan:   Post-operative Plan:   Informed Consent: I have reviewed the patients History and Physical, chart, labs and discussed the procedure including the risks, benefits and alternatives for the proposed anesthesia with the patient or  authorized representative who has indicated his/her understanding and acceptance.     Plan Discussed with: CRNA and Surgeon  Anesthesia Plan Comments: (Initially she claimed propofol made her depressed 2 days after her Nissen Revision surgery, but she had no problem after propofol sedation for sigmoidoscopy prior to that. It seems her issues are with other drugs given during the post op period and propofol was implicated incorrectly. Have discussed this extensively with the pt and she agrees to go ahead with propofol for the sedation.  )        Anesthesia Quick Evaluation

## 2017-03-21 NOTE — H&P (View-Only) (Signed)
Subjective:    Patient ID: Yesenia Boyd, female    DOB: 10-09-1956, 61 y.o.   MRN: [id number]  Toma Deiters, MD  HPI HAVING A FLARE FOR ABOUT A MON. SYMPTOMS, RECTAL BLEEDING, ABDOMINAL PAIN. MILD NAUSEA AND REFLUX(REGURGITATION), VOMITING (NO BLOOD). NOW HAVING DIFFICULTY PASSING HER STOOLS. NOW BACK ON [name] FOR PAST 2 WEEKS. HAD SUBJECTIVE CHILLS BEFORE SHE HAS A BM. MAIN THING GOT LEFT WAS HER REFLUX: CUT BACK CARAFATE AND RANITIDINE AS INSTRUCTED. ABDOMINAL PAIN NOW UPPER ABDOMEN. FEELS WHEN  SHE BREATHES AND SOMETIMES IT ACHES. USING CARAFATE ON WEEKENDS.  STILL SORE.  ALLERGY REACTION TO LINZESS-BLISTERS ON HER MOUTH, DIARRHEA AFTER 7-8 DAYS. LAST DOSE: 1 MO AGO. NOW HAS SOB DUE TO ASTHMA.  PT DENIES FEVER, HEMATEMESIS,  melena, CHEST PAIN, CHANGE IN BOWEL IN HABITS, OR problems swallowing.  Past Medical History:  Diagnosis Date  . Anginal pain (HCC)   . Asthma   . Bile reflux gastritis   . Depression    pt believes it's related to multiple sicknesses  . Family history of adverse reaction to anesthesia    sister required ventilatory support after tonsillectomy- told that " in the future need to be very careful with anesth."  . Fibromyalgia    sees Dr Philis Kendall at [facility]  . GERD (gastroesophageal reflux disease)   . History of hiatal hernia    2 surgeries, Lap. Nissen Fundiplication   . IBS (irritable bowel syndrome)   . Magnesium deficiency   . Myasthenia gravis    right sided weakness occ.  . Myasthenia gravis in remission (HCC)   . Neuropathy   . Numbness and tingling in right hand    and whole left side of body-pt sts she has "spells to where she cannot walk or talk"  . Peripheral neuropathy   . S/P colonoscopy April 2009, Sept 2012   normal Dr. Linna Darner, internal hemorrhoids, repeat in Sept 2022  . S/P endoscopy 2008, Aug 2010, Sept 2012   Dr. Linna Darner 2008: removal of impacted food bolus, Dr. Linna Darner 2010: moderate gastritis, Sept 2012 with SLF: path with mild gastritis,  no definite stricture noted, dilation with Savary 16 mm  . Seizures (HCC)    Pt. reports -yes, pt. reports that she blanks out sometimes & the doctors have told her that's like a seizure    . Shortness of breath dyspnea    pt believes it's related to asthma  . Sleep apnea 2010   no cpap used, was told to get oxygen  to use, pt. lnows that she needs new evaluation   . Stroke [facility])    TIA 2010  . TIA (transient ischemic attack) 2012   Past Surgical History:  Procedure Laterality Date  . ABDOMINAL HYSTERECTOMY     complete per patient  . BLADDER SURGERY     stretch bladder opening  . CHOLECYSTECTOMY    . COLONOSCOPY  07/18/2011   SLF: 1. internal hemorrhoids  . ESOPHAGEAL MANOMETRY N/A 01/21/2013   Procedure: ESOPHAGEAL MANOMETRY (EM);  Surgeon: Valarie Merino, MD;  Location: [facility];  Service: General;  Laterality: N/A;  . esophageal surgery?     2008 [facility]  . ESOPHAGOGASTRODUODENOSCOPY  07/18/2011   SLF: 1. Moderate gastritis 2. Dysphagia most likely 2o large food bolus moving through her Nissen, which appears to be intact. Pt has poor denttition and NL BPE 2 years ago. 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  Marland Kitchen EVALUATION UNDER ANESTHESIA  WITH HEMORRHOIDECTOMY N/A 03/27/2015   Procedure: EXAM UNDER ANESTHESIA WITH SPHINCTEROTOMY ;  Surgeon: Luretha MurphyMatthew Martin, MD;  Location: [facility];  Service: General;  Laterality: N/A;  . FLEXIBLE SIGMOIDOSCOPY N/A 12/09/2014   SLF: Rectal pain most likely due to internal and external hemorroids  . HAND SURGERY     carpal tumnnel right and removal of cyst left  . HEMORRHOID BANDING N/A 12/09/2014   Procedure: HEMORRHOID BANDING;  Surgeon: West BaliSandi L Bowdy Bair, MD;  Location: [facility];  Service: Endoscopy;  Laterality: N/A;  . LAPAROSCOPIC NISSEN FUNDOPLICATION N/A 02/13/2013   Procedure: Redo LAPAROSCOPIC NISSEN FUNDOPLICATION;  Surgeon: Valarie MerinoMatthew B Martin, MD;  Location: [facility];  Service: General;  Laterality: N/A;  Forgut  explortation with partial  takedown nissan funliplication from 1994 for wrap torsion and persistant dysphagia  . NASAL SEPTOPLASTY W/ TURBINOPLASTY Bilateral 08/03/2016   Procedure: NASAL SEPTOPLASTY WITH TURBINATE REDUCTION;  Surgeon: Newman PiesSu Teoh, MD;  Location: [facility];  Service: ENT;  Laterality: Bilateral;  . NECK SURGERY     removal of "knot" from neck  . NISSEN FUNDOPLICATION     twice  . SAVORY DILATION  07/18/2011   Surgeon:Nyelle Wolfson M Shalondra Wunschel   Allergies  Allergen Reactions  . Aspirin Other (See Comments)    Stomach distress, rash  . Avelox [Moxifloxacin Hcl In Nacl] Anaphylaxis and Itching  . Linzess [Linaclotide] Swelling    Swelling, bloating, nausea.Med used for "IBS"  . Adhesive [Tape] Other (See Comments)    Red and itchy under tape  . Carbamazepine Other (See Comments)    Memory loss   . Cephalexin Hives and Itching  . Codeine Hives and Itching  . Cymbalta [Duloxetine Hcl] Other (See Comments)    Hair loss, nervousness, severe constipation, red blotches  . Darvon [Propoxyphene] Hives  . Dicyclomine Itching and Other (See Comments)    Nervousness  . Latex Itching  . Macrolides And Ketolides Swelling  . Meperidine Hcl Itching and Other (See Comments)    Nervousness  . Metronidazole Hives and Other (See Comments)    Red blotches.  . Morphine Itching and Other (See Comments)    Nervousness  . Penicillins Hives  . Pepcid [Famotidine] Nausea And Vomiting  . Prednisone Hives, Swelling and Other (See Comments)    Red blotches - Can take with Benadryl  . Propofol     Depression   . Protonix [Pantoprazole] Swelling  . Sulfonamide Derivatives Hives  . Tetracycline Other (See Comments)    Reaction unknown  . Tramadol Hcl Hives and Other (See Comments)    Nervousness   Current Outpatient Prescriptions  Medication Sig Dispense Refill  . acetaminophen (TYLENOL) 650 MG CR tablet Take 1,300 mg by mouth every 8 (eight) hours as needed for pain.     Marland Kitchen. albuterol (PROVENTIL  HFA;VENTOLIN HFA) 108 (90 Base) MCG/ACT inhaler Inhale 1 puff into the lungs every 6 (six) hours as needed for wheezing or shortness of breath.    Marland Kitchen. CARAFATE 1 GM/10ML suspension Take 1 g by mouth 4 (four) times daily.     . Cholecalciferol ([employer] VIT D 5000 HIGH-POTENCY) 5000 UNITS capsule Take 10,000 Units by mouth every morning.     Marland Kitchen. EPINEPHrine 0.3 mg/0.3 mL IJ SOAJ injection Inject 0.3 mg into the muscle as needed (allergic reaction).    Marland Kitchen. estrogens, conjugated, (PREMARIN) 0.625 MG tablet Take 0.625 mg by mouth at bedtime.     . hydrOXYzine (VISTARIL) 25 MG capsule Take 25 mg by mouth at bedtime.     .Marland Kitchen  L-Methylfolate-B6-B12 (METANX PO) Take 1 tablet by mouth at bedtime.     . loratadine (CLARITIN) 10 MG tablet Take 10 mg by mouth daily.     . lubiprostone ([name]) 24 MCG capsule Take 24 mcg by mouth 2 (two) times daily with a meal.      . magnesium citrate solution Take 296 mLs by mouth daily as needed. For blockage    . montelukast (SINGULAIR) 10 MG tablet Take 10 mg by mouth at bedtime.    . nitroGLYCERIN (NITROSTAT) 0.4 MG SL tablet Place 0.4 mg under the tongue every 5 (five) minutes as needed for chest pain.    . ranitidine (ZANTAC) 300 MG tablet Take 300 mg by mouth at bedtime.    . simethicone (MYLICON) 125 MG chewable tablet Chew 125 mg by mouth every 6 (six) hours as needed for flatulence.    . tiZANidine (ZANAFLEX) 4 MG tablet Take 4 mg by mouth at bedtime.     . triamcinolone (KENALOG) 0.1 % cream Apply 1 application topically 2 (two) times daily as needed (For hair). For skin.    . vitamin E 1000 UNIT capsule Take 1,000 Units by mouth daily.    .    0   Review of Systems PER HPI OTHERWISE ALL SYSTEMS ARE NEGATIVE.    Objective:   Physical Exam  Constitutional: She is oriented to person, place, and time. She appears well-developed and well-nourished. No distress.  HENT:  Head: Normocephalic and atraumatic.  Mouth/Throat: Oropharynx is clear and moist. No oropharyngeal  exudate.  Eyes: Pupils are equal, round, and reactive to light. No scleral icterus.  Neck: Normal range of motion. Neck supple.  Cardiovascular: Normal rate, regular rhythm and normal heart sounds.   Pulmonary/Chest: Effort normal and breath sounds normal. No respiratory distress.  Abdominal: Soft. Bowel sounds are normal. She exhibits no distension. There is tenderness. There is no rebound.  MILD PERI-UMBILICAL TTP  Musculoskeletal: She exhibits no edema.  Lymphadenopathy:    She has no cervical adenopathy.  Neurological: She is alert and oriented to person, place, and time.  NO FOCAL DEFICITS  Psychiatric: She has a normal mood and affect.  Vitals reviewed.      Assessment & Plan:   

## 2017-03-21 NOTE — Op Note (Signed)
[facility] Patient Name: Yesenia OldStella Boyd Procedure Date: 03/21/2017 10:41 AM MRN: [id number] Date of Birth: 02/23/1956 Attending MD: Jonette EvaSandi Michella Detjen , MD CSN: [id number] Age: [age] Admit Type: Outpatient Procedure:                Colonoscopy, DIAGNOSTIC Indications:              Hematochezia Providers:                Jonette EvaSandi Garett Tetzloff, MD, Loma MessingLurae B. Patsy LagerAlbert RN, RN, Dyann Ruddleonya                            Wilson Referring MD:             Lia HoppingXaje Hasanaj MD, MD Medicines:                Propofol per Anesthesia Complications:            No immediate complications. Estimated Blood Loss:     Estimated blood loss: none. Procedure:                Pre-Anesthesia Assessment:                           - Prior to the procedure, a History and Physical                            was performed, and patient medications and                            allergies were reviewed. The patient's tolerance of                            previous anesthesia was also reviewed. The risks                            and benefits of the procedure and the sedation                            options and risks were discussed with the patient.                            All questions were answered, and informed consent                            was obtained. Prior Anticoagulants: The patient has                            taken no previous anticoagulant or antiplatelet                            agents. ASA Grade Assessment: II - A patient with                            mild systemic disease. After reviewing the risks  and benefits, the patient was deemed in                            satisfactory condition to undergo the procedure.                           After obtaining informed consent, the colonoscope                            was passed under direct vision. Throughout the                            procedure, the patient's blood pressure, pulse, and                            oxygen saturations were  monitored continuously. The                            EC-[id number] ([id number]) scope was introduced through                            the anus and advanced to the 10 cm into the ileum.                            The colonoscopy was somewhat difficult due to a                            tortuous colon. Successful completion of the                            procedure was aided by increasing the dose of                            sedation medication and COLOWRAP. The patient                            tolerated the procedure fairly well. The quality of                            the bowel preparation was good. The terminal ileum,                            ileocecal valve, appendiceal orifice, and rectum                            were photographed. Scope In: 11:05:27 AM Scope Out: 11:22:55 AM Scope Withdrawal Time: 0 hours 14 minutes 17 seconds  Total Procedure Duration: 0 hours 17 minutes 28 seconds  Findings:      The terminal ileum appeared normal.      The recto-sigmoid colon and sigmoid colon were moderately redundant.      External and internal hemorrhoids were found during retroflexion. The       hemorrhoids were moderate. Impression:               -  The examined portion of the ileum was normal.                           - Redundant LEFT colon.                           - External HEMORRHOIDS                           - RECTAL BLEEDING DUE TO internal hemorrhoids. Moderate Sedation:      Per Anesthesia Care Recommendation:           - Repeat colonoscopy in 10 years for surveillance.                           - High fiber diet.                           - Continue present medications.                           - Patient has a contact number available for                            emergencies. The signs and symptoms of potential                            delayed complications were discussed with the                            patient. Return to normal activities tomorrow.                             Written discharge instructions were provided to the                            patient. Procedure Code(s):        --- Professional ---                           [phone], Colonoscopy, flexible; diagnostic, including                            collection of specimen(s) by brushing or washing,                            when performed (separate procedure) Diagnosis Code(s):        --- Professional ---                           X91.4, Other hemorrhoids                           K92.1, Melena (includes Hematochezia)                           Q43.8, Other specified congenital malformations of  intestine CPT copyright 2016 American Medical Association. All rights reserved. The codes documented in this report are preliminary and upon coder review may  be revised to meet current compliance requirements. Jonette Eva, MD Jonette Eva, MD 03/21/2017 11:29:40 AM This report has been signed electronically. Number of Addenda: 0

## 2017-03-21 NOTE — Anesthesia Procedure Notes (Signed)
Procedure Name: MAC Date/Time: 03/21/2017 10:53 AM Performed by: Andree Elk, Gilma Bessette A Pre-anesthesia Checklist: Patient identified, Emergency Drugs available, Suction available, Patient being monitored and Timeout performed Oxygen Delivery Method: Simple face mask

## 2017-03-21 NOTE — Anesthesia Postprocedure Evaluation (Signed)
Anesthesia Post Note  Patient: Yesenia Boyd  Procedure(s) Performed: Procedure(s) (LRB): COLONOSCOPY WITH CHOICE ANESTHESIA (N/A)  Patient location during evaluation: PACU Anesthesia Type: MAC Level of consciousness: awake, oriented and patient cooperative Pain management: pain level controlled Vital Signs Assessment: post-procedure vital signs reviewed and stable Respiratory status: spontaneous breathing, respiratory function stable and patient connected to nasal cannula oxygen Cardiovascular status: stable Postop Assessment: no signs of nausea or vomiting Anesthetic complications: no     Last Vitals:  Vitals:   03/21/17 1040 03/21/17 1045  BP:    Pulse:    Resp: 17 20  Temp:      Last Pain:  Vitals:   03/21/17 0958  TempSrc: Oral  PainSc: 5                  ADAMS, AMY A

## 2017-03-21 NOTE — Interval H&P Note (Signed)
History and Physical Interval Note:  03/21/2017 10:40 AM  Yesenia SonsStella L Frees  has presented today for surgery, with the diagnosis of rectal bleeding, anemia, abd pain  The various methods of treatment have been discussed with the patient and family. After consideration of risks, benefits and other options for treatment, the patient has consented to  Procedure(s) with comments: COLONOSCOPY WITH CHOICE ANESTHESIA (N/A) - 10:45am as a surgical intervention .  The patient's history has been reviewed, patient examined, no change in status, stable for surgery.  I have reviewed the patient's chart and labs.  Questions were answered to the patient's satisfaction.     [employer]aton CorporationSandi Fields

## 2017-03-23 ENCOUNTER — Encounter (HOSPITAL_COMMUNITY): Payer: Self-pay | Admitting: Gastroenterology

## 2017-03-24 ENCOUNTER — Telehealth: Payer: Self-pay

## 2017-03-24 ENCOUNTER — Ambulatory Visit (HOSPITAL_COMMUNITY)
Admission: RE | Admit: 2017-03-24 | Discharge: 2017-03-24 | Disposition: A | Discharge status: Home / Self Care | Payer: Managed Care, Other (non HMO) | Source: Ambulatory Visit | Attending: Gastroenterology | Admitting: Gastroenterology

## 2017-03-24 ENCOUNTER — Other Ambulatory Visit: Payer: Self-pay

## 2017-03-24 DIAGNOSIS — R918 Other nonspecific abnormal finding of lung field: Secondary | ICD-10-CM | POA: Diagnosis not present

## 2017-03-24 DIAGNOSIS — R109 Unspecified abdominal pain: Secondary | ICD-10-CM | POA: Diagnosis not present

## 2017-03-24 DIAGNOSIS — R1033 Periumbilical pain: Secondary | ICD-10-CM

## 2017-03-24 DIAGNOSIS — K229 Disease of esophagus, unspecified: Secondary | ICD-10-CM | POA: Diagnosis not present

## 2017-03-24 NOTE — Telephone Encounter (Signed)
PLEASE CALL PT. SHE NEEDS A CT SCAN TODAY.

## 2017-03-24 NOTE — Telephone Encounter (Signed)
PT is aware. [place] to [place].

## 2017-03-24 NOTE — Telephone Encounter (Signed)
PT called and said she continues to have abdominal pain since her colonoscopy on Tues, 03/21/2017 and that it is a little worse.  It hurts across the center of her stomach and to the right and also right below her umbilical.  She is having regular BM's since procedure. Please advise!

## 2017-03-24 NOTE — Telephone Encounter (Signed)
Called pt. She last had something to eat at approx 9:30am. Advised pt to not eat/drink until I called her back. Submitted PA for CT abdomen/pelvis wo contrast via [employer] website. Case approved. Auth# [id number], 03/24/17-06/22/17. Called Central Scheduling, [facility]. Called pt back and informed her and nothing to eat/drink.

## 2017-03-24 NOTE — Telephone Encounter (Signed)
Paged Dr. Darrick PennaFields for more info for the Ct.

## 2017-03-24 NOTE — Telephone Encounter (Signed)
Called patient TO DISCUSS RESULTS. LVM FOR Yesenia Boyd. CT SCAN SHOWS AIR/STOOL. SHE NEEDS TO  HAVE A BM AND PASS GAS. MAY CALL [phone] WITH QUESTIONS.

## 2017-03-27 NOTE — Telephone Encounter (Signed)
Noted  

## 2017-04-18 DIAGNOSIS — L710 Perioral dermatitis: Secondary | ICD-10-CM | POA: Diagnosis not present

## 2017-04-18 DIAGNOSIS — L819 Disorder of pigmentation, unspecified: Secondary | ICD-10-CM | POA: Diagnosis not present

## 2017-04-24 DIAGNOSIS — F458 Other somatoform disorders: Secondary | ICD-10-CM | POA: Diagnosis not present

## 2017-04-24 DIAGNOSIS — K219 Gastro-esophageal reflux disease without esophagitis: Secondary | ICD-10-CM | POA: Diagnosis not present

## 2017-04-24 DIAGNOSIS — Z6826 Body mass index (BMI) 26.0-26.9, adult: Secondary | ICD-10-CM | POA: Diagnosis not present

## 2017-05-11 DIAGNOSIS — L710 Perioral dermatitis: Secondary | ICD-10-CM | POA: Diagnosis not present

## 2017-05-15 DIAGNOSIS — D649 Anemia, unspecified: Secondary | ICD-10-CM | POA: Diagnosis not present

## 2017-05-15 DIAGNOSIS — Z79899 Other long term (current) drug therapy: Secondary | ICD-10-CM | POA: Diagnosis not present

## 2017-05-15 DIAGNOSIS — M810 Age-related osteoporosis without current pathological fracture: Secondary | ICD-10-CM | POA: Diagnosis not present

## 2017-05-15 DIAGNOSIS — G7000 Myasthenia gravis without (acute) exacerbation: Secondary | ICD-10-CM | POA: Diagnosis not present

## 2017-05-15 DIAGNOSIS — J45909 Unspecified asthma, uncomplicated: Secondary | ICD-10-CM | POA: Diagnosis not present

## 2017-05-15 DIAGNOSIS — G4730 Sleep apnea, unspecified: Secondary | ICD-10-CM | POA: Diagnosis not present

## 2017-05-15 DIAGNOSIS — Z780 Asymptomatic menopausal state: Secondary | ICD-10-CM | POA: Diagnosis not present

## 2017-05-20 IMAGING — CT CT MAXILLOFACIAL W/O CM
3 series · 14 of 47 positions shown, 16 images · non-contrast
Comparison: None.

CLINICAL DATA: 59-year-old female with 6 month history of sinus
swelling and pressure with fullness. Recent sinus infection around
eyes lasting for 2 weeks. No injury.

EXAM:
CT MAXILLOFACIAL WITHOUT CONTRAST
TECHNIQUE: Multidetector CT imaging of the maxillofacial structures was
performed. Multiplanar CT image reconstructions were also generated.
A small metallic BB was placed on the right temple in order to
reliably differentiate right from left.

[Series 2: axial standard · axial · 0.30mm/px · z∈[+50,+134]mm · 8 of 99 slices shown, 10 images]
[im 7/99  brain]
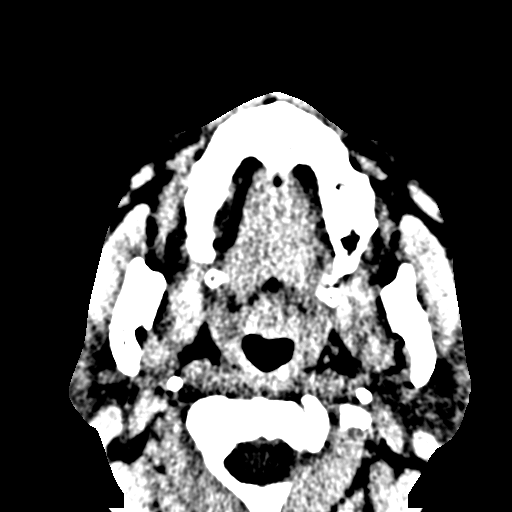
[im 7/99  bone]
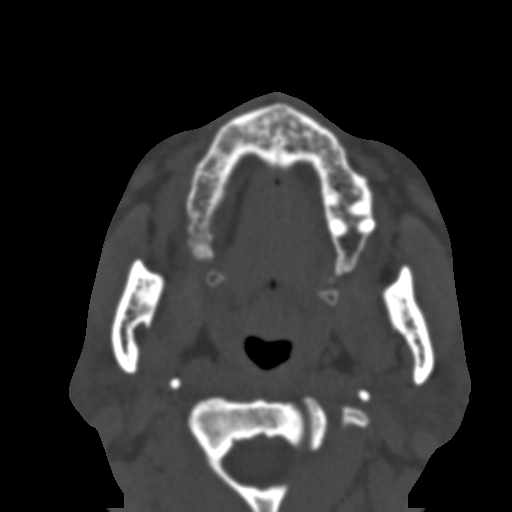
[im 21/99  bone]
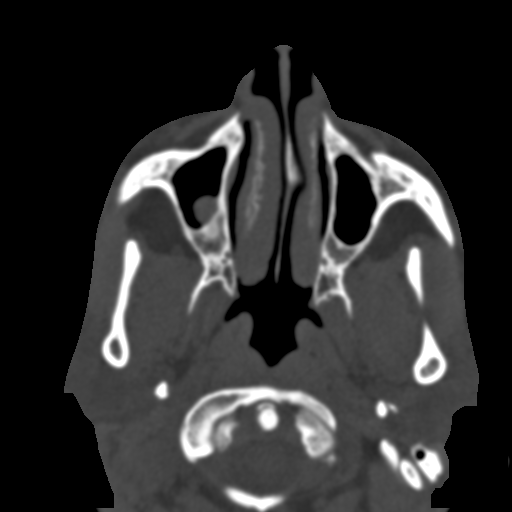
[im 31/99  bone]
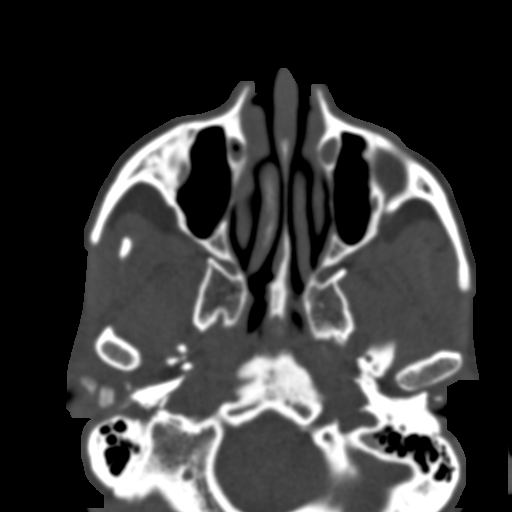
[im 44/99  bone]
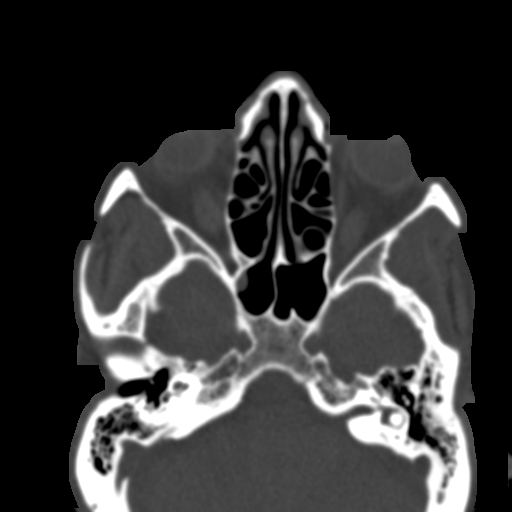
[im 55/99  brain]
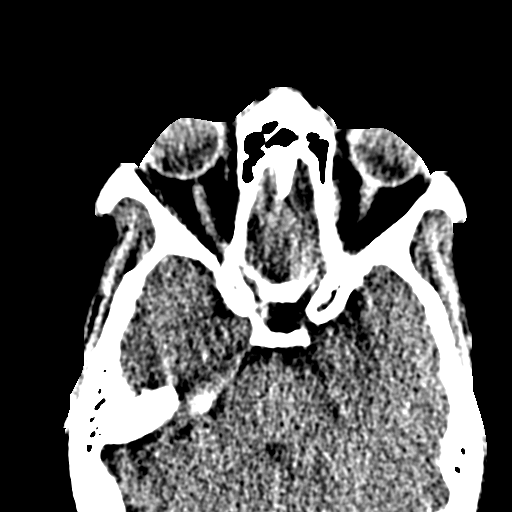
[im 55/99  bone]
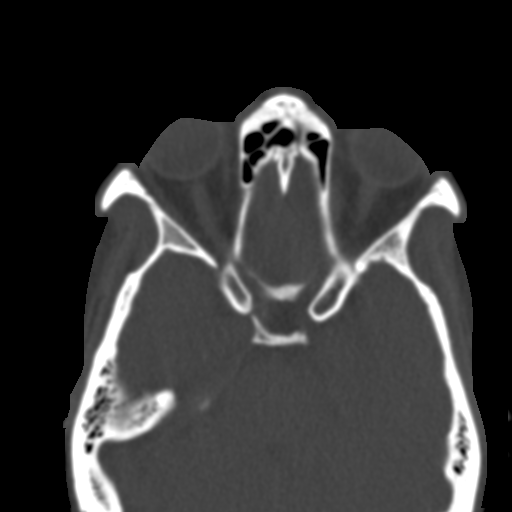
[im 68/99  bone]
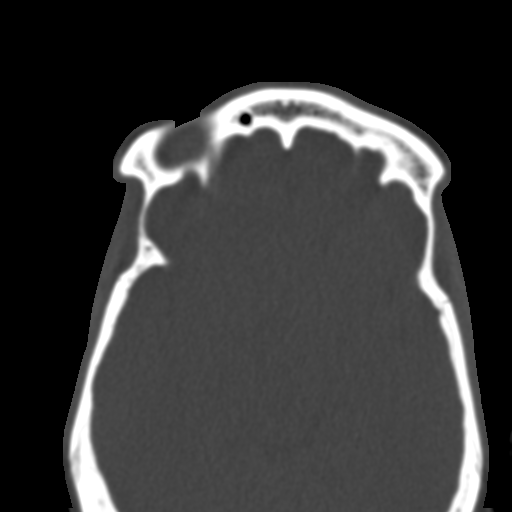
[im 78/99  bone]
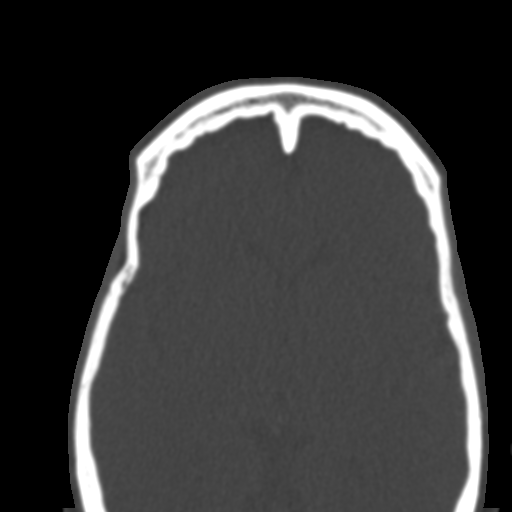
[im 92/99  bone]
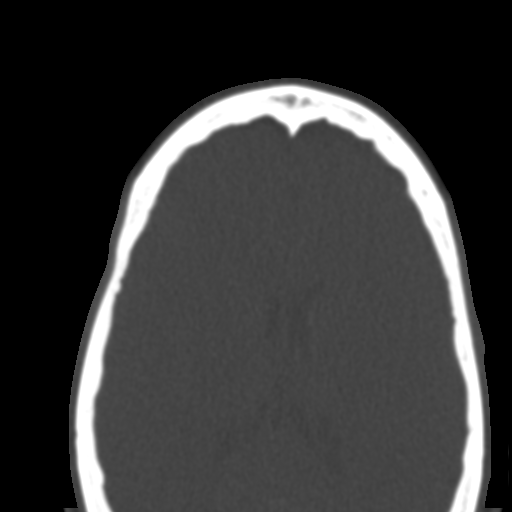

[Series 4: coronal sinus · coronal · 0.22mm/px · 3 of 74 slices shown]
[im 25/74  bone]
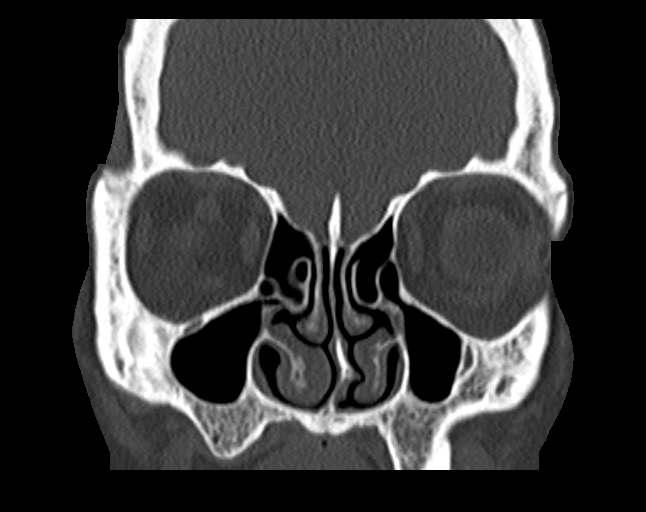
[im 33/74  bone]
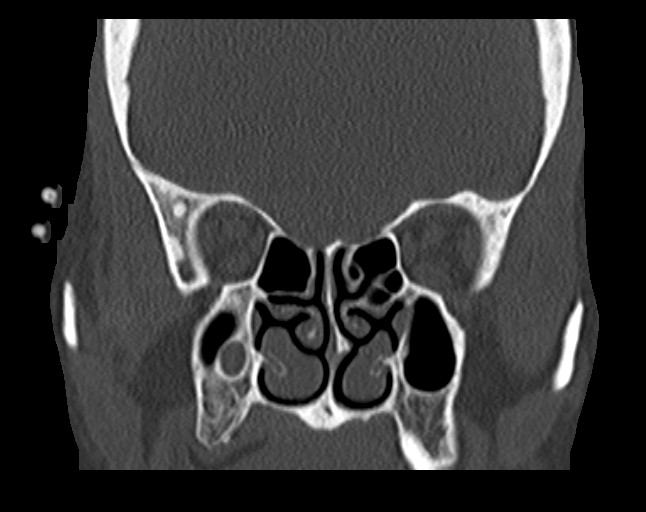
[im 41/74  bone]
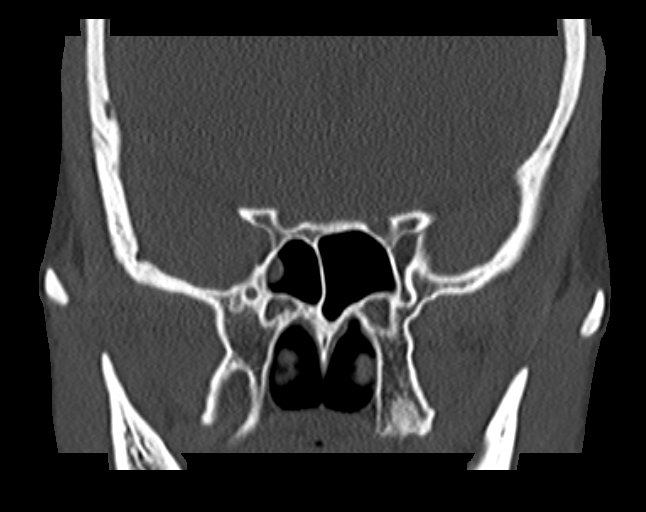

[Series 5: sagittal sinus · sagittal · 0.21mm/px · 3 of 74 slices shown]
[im 25/74  bone]
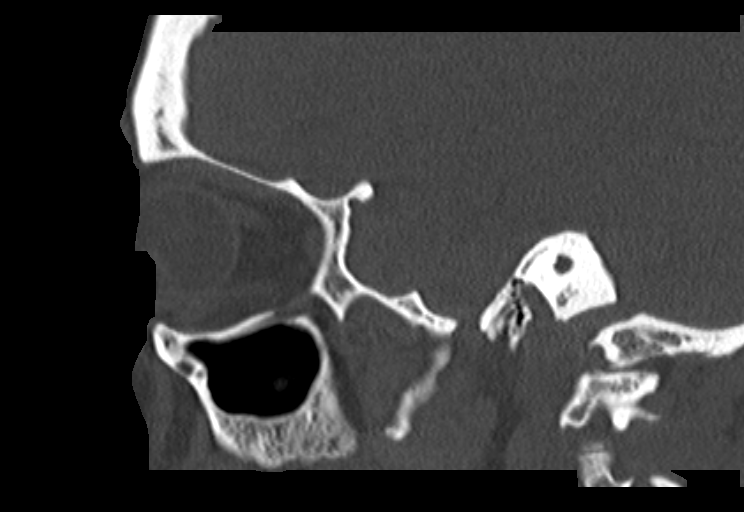
[im 37/74  bone]
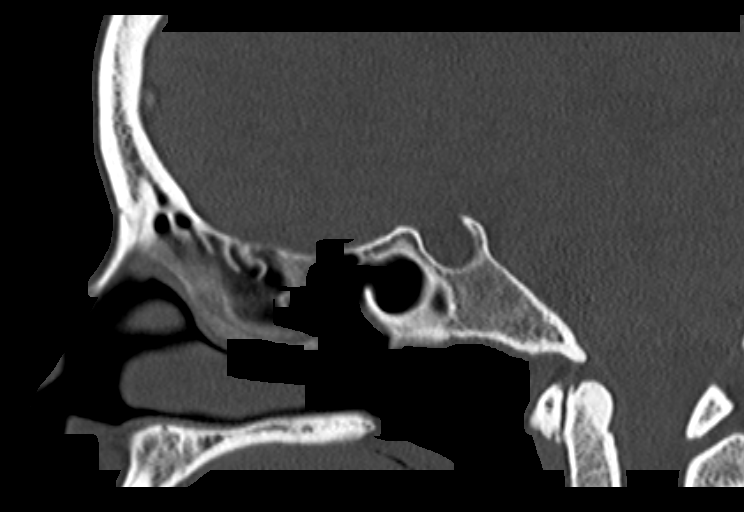
[im 49/74  bone]
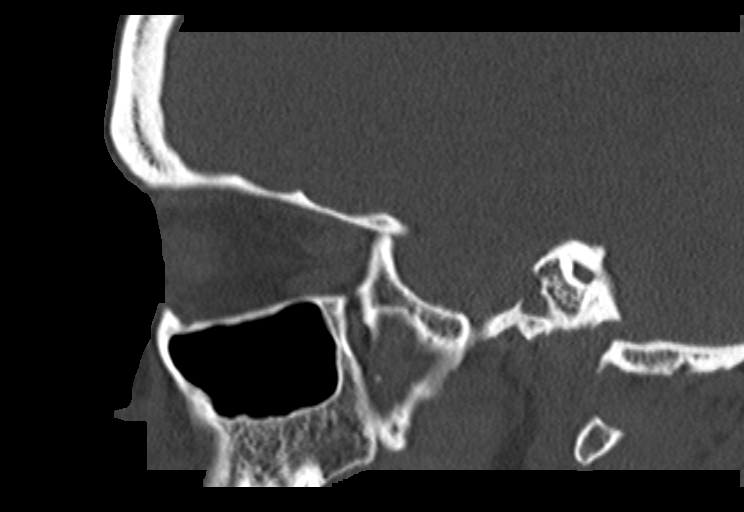

[14 of 47 positions shown; findings below may reference images not displayed]

FINDINGS: Left frontal sinus not formed.  Small clear right frontal sinus.

Anterior lateral aspect of the right sphenoid sinus with 4 x 5 x 4
mm polypoid opacification. Clear left sphenoid sinus. Sphenoid sinus
air cells do not extend significantly beneath the sella. The optic
nerve courses along the superior lateral aspect of the sphenoid
sinus air cells. Carotid artery borders posterior lateral wall of
the sphenoid sinuses with slightly thin overlying bony cover.

Ethmoid sinus air cells are clear.  Keros 1 bilaterally

Posterior right maxillary sinus 1.5 x 1 x 1.1 cm polypoid
opacification. Clear left maxillary sinus. Infundibulum patent
bilaterally.

Nasal septum with spur to the left.

Symmetric slightly prominent body of the inferior rectus muscle
bilaterally without other extra-ocular muscle abnormality to suggest
Graves disease.

Visualized intracranial structures without acute abnormality.

Visualized mastoid air cells and middle ear cavities are clear.
Ossicles appear intact. Normal appearance labyrinthine structures.
Top-normal size right vestibular aqueduct.
IMPRESSION: Left frontal sinus not formed.  Small clear right frontal sinus.

Anterior lateral aspect of the right sphenoid sinus with 4 x 5 x 4
mm polypoid opacification. Clear left sphenoid sinus.

Ethmoid sinus air cells are clear.

Posterior right maxillary sinus 1.5 x 1 x 1.1 cm polypoid
opacification. Clear left maxillary sinus. Infundibulum patent
bilaterally.

Important bony landmarks as noted above.

Symmetric slightly prominent body of the inferior rectus muscle
bilaterally without other extra-ocular muscle abnormality to suggest
Graves disease.

## 2017-06-07 ENCOUNTER — Telehealth: Payer: Self-pay

## 2017-06-07 NOTE — Telephone Encounter (Signed)
PT called to say she was suppose to decrease Nexium to once a day after 3 months. She tried it and her reflux was worse. She is on Doxycycline for skin problems and will be on it for several weeks. She wants to know if Dr. Darrick PennaFields says it is OK to continue on bid dosing for a while longer due to this medication. She just did not want to run out of pills. Please advise!

## 2017-06-08 NOTE — Telephone Encounter (Signed)
Pt is aware.  

## 2017-06-08 NOTE — Telephone Encounter (Signed)
LMOM to call.

## 2017-06-08 NOTE — Telephone Encounter (Signed)
PLEASE CALL PT. She can take Nexium bid. She has refills for #60 until APR 2019.

## 2017-07-03 ENCOUNTER — Telehealth: Payer: Self-pay | Admitting: Gastroenterology

## 2017-07-03 NOTE — Telephone Encounter (Signed)
Pt is calling to see about a referral for hemorrhoid surgery? She is wanting to go to [facility]. Please advise

## 2017-07-03 NOTE — Telephone Encounter (Signed)
LMOM to call back

## 2017-07-03 NOTE — Telephone Encounter (Signed)
[phone] patient called and left message, about being sent to a doctor that was discussed here.

## 2017-07-05 NOTE — Telephone Encounter (Signed)
REFERRAL TO [facility] DX: RECTAL PAIN/BLLEDING, EVALUATE FOR HEMORRHOIDECTOMY.

## 2017-07-06 ENCOUNTER — Other Ambulatory Visit: Payer: Self-pay

## 2017-07-06 DIAGNOSIS — K649 Unspecified hemorrhoids: Secondary | ICD-10-CM

## 2017-07-06 NOTE — Telephone Encounter (Signed)
Referral has been made.

## 2017-07-18 ENCOUNTER — Encounter: Payer: Self-pay | Admitting: Gastroenterology

## 2017-07-19 DIAGNOSIS — Z23 Encounter for immunization: Secondary | ICD-10-CM | POA: Diagnosis not present

## 2017-09-06 ENCOUNTER — Ambulatory Visit (INDEPENDENT_AMBULATORY_CARE_PROVIDER_SITE_OTHER): Payer: 59 | Admitting: Gastroenterology

## 2017-09-06 ENCOUNTER — Encounter: Payer: Self-pay | Admitting: Gastroenterology

## 2017-09-06 DIAGNOSIS — K219 Gastro-esophageal reflux disease without esophagitis: Secondary | ICD-10-CM | POA: Diagnosis not present

## 2017-09-06 DIAGNOSIS — K625 Hemorrhage of anus and rectum: Secondary | ICD-10-CM

## 2017-09-06 NOTE — Patient Instructions (Signed)
CONTINUE NEXIUM. TAKE 30 MINUTES BEFORE MEALS TWICE DAILY.   CONTINUE ZANTAC AT BEDTIME MON-FRI.   USE CARAFATE ON WEEKENDS IF NEEDED.  AVOID REFLUX TRIGGERS.   YOU CAN USE [facility] CREAM OR PREPARATION H TO CONTROL RECTAL PAIN/BLEEDING.  FOLLOW UP IN 6 MOS.

## 2017-09-06 NOTE — Progress Notes (Signed)
Subjective:    Patient ID: Yesenia Boyd, female    DOB: February 16, 1956, 61 y.o.   MRN: [id number]  Toma Deiters, MD   HPI WENT FOR HEMORRHOID SURGERY BUT HAD AN ANAL FISSURE. NO SURGERY  DONE. SAW COLORECTAL SPECIALIST. HAD MUSCLE SPASM. WANTS TO TALK TO DR. HASANJ ABOUT SPASM AND CHECKING HER POTASSIUM. BLEEDING IS RARE. HAS CLEAR STUFF COME OUT. RARE RECTAL BLEEDING. GETS SORE IN RECTUM. NOW TAKING SENNA-KOT QOD. HAVING SPASM IN LOWER ABDOMEN AND BACK SO BAD SHE COULDN'T WALK.  NAUSEA: OFF AND ON-QOD. MEDS HELP: NONE TAKING. IF SHE COULD GET SOME REST.  ASKED HER TOTRY MELATONIN. THINKS MAY HAVE CAUSED RETAINED FLUID. WHEN BOWELS DON'T MOVE SHE GETS SICK. TOOK [name] AND CITRATE TI GET IT GOING AGAIN. FOOD COMES BACK UP AND MAY FEELS LIKE IT GETS STUCK GOING DOWN. ONE PLACE ON ABDOMEN REMAINS SORE. WEIGHT STABLE. HEARTBURN: NOT AS BAD. MAY HAVE CHEST PAIN AND TAKES NTG AND IT STOPS MUSCLES SPASM(1 OR 2 IF NEEDED).  PT DENIES FEVER, CHILLS, vomiting, melena, diarrhea,  SHORTNESS OF BREATH, OR CHANGE IN BOWEL IN HABITS.  Past Medical History:  Diagnosis Date  . Anginal pain (HCC)   . Asthma   . Bile reflux gastritis   . Complication of anesthesia    Deep depression following administration of propofol  . Depression    pt believes it's related to multiple sicknesses  . Family history of adverse reaction to anesthesia    sister required ventilatory support after tonsillectomy- told that " in the future need to be very careful with anesth."  . Fibromyalgia    sees Dr Philis Kendall at [facility]  . GERD (gastroesophageal reflux disease)   . History of hiatal hernia    2 surgeries, Lap. Nissen Fundiplication   . IBS (irritable bowel syndrome)   . Magnesium deficiency   . Myasthenia gravis    right sided weakness occ.  . Myasthenia gravis in remission (HCC)   . Neuropathy   . Numbness and tingling in right hand    and whole left side of body-pt sts she has "spells to where she cannot walk or talk"    . Peripheral neuropathy   . S/P colonoscopy April 2009, Sept 2012   normal Dr. Linna Darner, internal hemorrhoids, repeat in Sept 2022  . S/P endoscopy 2008, Aug 2010, Sept 2012   Dr. Linna Darner 2008: removal of impacted food bolus, Dr. Linna Darner 2010: moderate gastritis, Sept 2012 with SLF: path with mild gastritis, no definite stricture noted, dilation with Savary 16 mm  . Seizures (HCC)    Pt. reports -yes, pt. reports that she blanks out sometimes & the doctors have told her that's like a seizure    . Shortness of breath dyspnea    pt believes it's related to asthma  . Sleep apnea 2010   no cpap used, was told to get oxygen  to use, pt. lnows that she needs new evaluation   . Stroke [facility])    TIA 2010  . TIA (transient ischemic attack) 2012    Past Surgical History:  Procedure Laterality Date  . ABDOMINAL HYSTERECTOMY     complete per patient  . BLADDER SURGERY     stretch bladder opening  . CHOLECYSTECTOMY    . COLONOSCOPY  07/18/2011   SLF: 1. internal hemorrhoids  . COLONOSCOPY WITH PROPOFOL N/A 03/21/2017   Procedure: COLONOSCOPY WITH CHOICE ANESTHESIA;  Surgeon: West Bali, MD;  Location: [facility];  Service: Endoscopy;  Laterality: N/A;  10:45am  . ESOPHAGEAL MANOMETRY N/A 01/21/2013   Procedure: ESOPHAGEAL MANOMETRY (EM);  Surgeon: Valarie MerinoMatthew B Martin, MD;  Location: [facility] END[facility];  Service: General;  Laterality: N/A;  . esophageal surgery?     2008 [facility]  . ESOPHAGOGASTRODUODENOSCOPY  07/18/2011   SLF: 1. Moderate gastritis 2. Dysphagia most likely 2o large food bolus moving through her Nissen, which appears to be intact. Pt has poor denttition and NL BPE 2 years ago. 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  Marland Kitchen. EVALUATION UNDER ANESTHESIA WITH HEMORRHOIDECTOMY N/A 03/27/2015   Procedure: EXAM UNDER ANESTHESIA WITH SPHINCTEROTOMY ;  Surgeon: Luretha MurphyMatthew Martin, MD;  Location: [facility] ORS;  Service: General;  Laterality: N/A;  . FLEXIBLE SIGMOIDOSCOPY N/A  12/09/2014   SLF: Rectal pain most likely due to internal and external hemorroids  . HAND SURGERY     carpal tumnnel right and removal of cyst left  . HEMORRHOID BANDING N/A 12/09/2014   Procedure: HEMORRHOID BANDING;  Surgeon: West BaliSandi L Holley Kocurek, MD;  Location: [facility] ORS;  Service: Endoscopy;  Laterality: N/A;  . LAPAROSCOPIC NISSEN FUNDOPLICATION N/A 02/13/2013   Procedure: Redo LAPAROSCOPIC NISSEN FUNDOPLICATION;  Surgeon: Valarie MerinoMatthew B Martin, MD;  Location: [facility];  Service: General;  Laterality: N/A;  Forgut explortation with partial  takedown nissan funliplication from 1994 for wrap torsion and persistant dysphagia  . NASAL SEPTOPLASTY W/ TURBINOPLASTY Bilateral 08/03/2016   Procedure: NASAL SEPTOPLASTY WITH TURBINATE REDUCTION;  Surgeon: Newman PiesSu Teoh, MD;  Location: [facility] OR;  Service: ENT;  Laterality: Bilateral;  . NECK SURGERY     removal of "knot" from neck  . NISSEN FUNDOPLICATION     twice  . SAVORY DILATION  07/18/2011   Surgeon:Ethelmae Ringel M Osmara Drummonds    Allergies  Allergen Reactions  . Aspirin Other (See Comments)    Stomach distress, rash  . Avelox [Moxifloxacin Hcl In Nacl] Anaphylaxis and Itching  . Linzess [Linaclotide] Swelling    Swelling, bloating, nausea, BLISTERS IN HER MOUTH  . Penicillins Anaphylaxis and Hives      . Adhesive [Tape] Other (See Comments)    Red and itchy under tape  . Carbamazepine Other (See Comments)    Memory loss   . Cephalexin Hives and Itching  . Codeine Hives and Itching  . Cymbalta [Duloxetine Hcl] Other (See Comments)    Hair loss, nervousness, severe constipation, red blotches  . Darvon [Propoxyphene] Hives  . Dicyclomine Itching and Other (See Comments)    Nervousness  . Hydrocodone-Acetaminophen Itching  . Ketek [Telithromycin]   . Latex Itching  . Macrolides And Ketolides Swelling  . Meperidine Hcl Itching and Other (See Comments)    Nervousness  . Metronidazole Hives and Other (See Comments)    Red blotches.  . Morphine Itching and Other (See  Comments)    Nervousness  . Pepcid [Famotidine] Nausea And Vomiting  . Prednisone Hives, Swelling and Other (See Comments)    Red blotches - Can take with Benadryl  . Propofol Other (See Comments)    Depression   . Protonix [Pantoprazole] Swelling  . Sulfonamide Derivatives Hives  . Tramadol Hcl Hives and Other (See Comments)    Nervousness  . Zylet [Loteprednol-Tobramycin] Hives and Swelling   Current Outpatient Prescriptions  Medication Sig Dispense Refill  . acetaminophen (TYLENOL) 650 MG CR tablet Take 1,300 mg by mouth See admin instructions. Takes 2 tablets every night at bedtime. Takes 2 tablets every 8 hours as needed for pain.    Marland Kitchen. albuterol (  PROVENTIL HFA;VENTOLIN HFA) 108 (90 Base) MCG/ACT inhaler Inhale 1-2 puffs into the lungs every 6 (six) hours as needed for wheezing or shortness of breath.     Marland Kitchen azelastine (OPTIVAR) 0.05 % ophthalmic solution Place 2 drops into both eyes 2 (two) times daily as needed for allergies or itching.    . beclomethasone (BECONASE-AQ) 42 MCG/SPRAY nasal spray Place 1 spray into both nostrils 2 (two) times daily. Dose is for each nostril.    Marland Kitchen CARAFATE 1 GM/10ML suspension Take 1 g by mouth 4 (four) times daily as needed (heartburn, reflux).     . Cholecalciferol ([employer] VIT D 5000 HIGH-POTENCY) 5000 UNITS capsule Take 10,000 Units by mouth every morning.     . Cyanocobalamin (VITAMELTS ENERGY VITAMIN B-12) 1500 MCG TBDP Take 1,500 mcg by mouth daily.    Marland Kitchen diltiazem 2 % GEL Apply 1 application topically 4 (four) times daily.    Marland Kitchen EPINEPHrine 0.3 mg/0.3 mL IJ SOAJ injection Inject 0.3 mg into the muscle as needed (allergic reaction).    Marland Kitchen esomeprazole (NEXIUM) 40 MG capsule 1 PO 30 MINUTES PRIOR TO MEALS BID for 3 mos then 1 po qd 60 capsule 11  . estrogens, conjugated, (PREMARIN) 0.625 MG tablet Take 0.625 mg by mouth at bedtime.     . hydrOXYzine (VISTARIL) 25 MG capsule Take 25 mg by mouth at bedtime.     Marland Kitchen L-Methylfolate-B6-B12 (FOLTANX) 3-35-2 MG  TABS Take 1 tablet by mouth daily.  0  . L-Methylfolate-B6-B12 (METANX PO) Take 1 tablet by mouth at bedtime.     Marland Kitchen levocetirizine (XYZAL) 5 MG tablet Take 5 mg by mouth every evening.    . loratadine (CLARITIN) 10 MG tablet Take 10 mg by mouth daily.     Marland Kitchen lubiprostone ([name]) 24 MCG capsule Take 24 mcg by mouth 2 (two) times daily with a meal.      . montelukast (SINGULAIR) 10 MG tablet Take 10 mg by mouth at bedtime.    . nitroGLYCERIN (NITROSTAT) 0.4 MG SL tablet Place 0.4 mg under the tongue every 5 (five) minutes as needed for chest pain.    . simethicone (MYLICON) 125 MG chewable tablet Chew 125 mg by mouth every 6 (six) hours as needed for flatulence.    Marland Kitchen tiZANidine (ZANAFLEX) 4 MG tablet Take 4 mg by mouth at bedtime.     . vitamin E 1000 UNIT capsule Take 1,000 Units by mouth at bedtime.     . Artificial Tear Solution (SOOTHE XP OP) Place 2 drops into both eyes 2 (two) times daily as needed (dry eyes).    . compounded topicals builder Apply 1 application topically 2 (two) times daily as needed (rash). Triamcinolone 0.1%, LCD 10%, SA 2% cream. Compounded by [facility].    . fluticasone (CUTIVATE) 0.05 % cream APPLY TO THE AFFECTED AREA(S) UP TO TWICE DAILY AS NEEDED  3  . magnesium citrate solution Take 148-296 mLs by mouth daily as needed (constipation). For blockage    . ranitidine (ZANTAC) 300 MG tablet Take 300 mg by mouth at bedtime.    . triamcinolone (KENALOG) 0.1 % cream Apply 1 application topically 2 (two) times daily as needed (rash on scalp).      No current facility-administered medications for this visit.      Review of Systems PER HPI OTHERWISE ALL SYSTEMS ARE NEGATIVE.    Objective:   Physical Exam  Constitutional: She is oriented to person, place, and time. She appears well-developed and well-nourished. No  distress.  HENT:  Head: Normocephalic and atraumatic.  Mouth/Throat: Oropharynx is clear and moist. No oropharyngeal exudate.  Eyes: Pupils are equal,  round, and reactive to light. No scleral icterus.  Neck: Normal range of motion. Neck supple.  Cardiovascular: Normal rate, regular rhythm and normal heart sounds.   Pulmonary/Chest: Effort normal and breath sounds normal. No respiratory distress.  Abdominal: Soft. Bowel sounds are normal. She exhibits no distension. There is no tenderness.  Musculoskeletal: She exhibits no edema.  Lymphadenopathy:    She has no cervical adenopathy.  Neurological: She is alert and oriented to person, place, and time.  Psychiatric: She has a normal mood and affect.  Vitals reviewed.     Assessment & Plan:

## 2017-09-06 NOTE — Progress Notes (Signed)
ON RECALL  °

## 2017-09-06 NOTE — Assessment & Plan Note (Addendum)
SYMPTOMS FAIRLY WELL CONTROLLED. SAW SURGERY AND NO INTERVENTION NEEDED.  USE [facility] CREAM OR PREPARATION H TO CONTROL RECTAL PAIN/BLEEDING. FOLLOW UP IN 4 MOS.

## 2017-09-06 NOTE — Assessment & Plan Note (Signed)
SYMPTOMS FAIRLY WELL CONTROLLED.  CONTINUE NEXIUM. TAKE 30 MINUTES BEFORE MEALS TWICE DAILY.  CONTINUE ZANTAC AT BEDTIME MON-FRI.  USE CARAFATE ON WEEKENDS IF NEEDED. AVOID REFLUX TRIGGERS.   FOLLOW UP IN 4 MOS.

## 2017-09-07 NOTE — Progress Notes (Signed)
cc'ed to pcp °

## 2017-11-03 DIAGNOSIS — Z1231 Encounter for screening mammogram for malignant neoplasm of breast: Secondary | ICD-10-CM | POA: Diagnosis not present

## 2017-11-23 DIAGNOSIS — K581 Irritable bowel syndrome with constipation: Secondary | ICD-10-CM | POA: Diagnosis not present

## 2017-11-23 DIAGNOSIS — Z6827 Body mass index (BMI) 27.0-27.9, adult: Secondary | ICD-10-CM | POA: Diagnosis not present

## 2017-12-21 DIAGNOSIS — F458 Other somatoform disorders: Secondary | ICD-10-CM | POA: Diagnosis not present

## 2017-12-21 DIAGNOSIS — Z6827 Body mass index (BMI) 27.0-27.9, adult: Secondary | ICD-10-CM | POA: Diagnosis not present

## 2017-12-21 DIAGNOSIS — K210 Gastro-esophageal reflux disease with esophagitis: Secondary | ICD-10-CM | POA: Diagnosis not present

## 2017-12-21 DIAGNOSIS — H1045 Other chronic allergic conjunctivitis: Secondary | ICD-10-CM | POA: Diagnosis not present

## 2018-01-03 ENCOUNTER — Encounter: Payer: Self-pay | Admitting: Gastroenterology

## 2018-01-18 DIAGNOSIS — Z6828 Body mass index (BMI) 28.0-28.9, adult: Secondary | ICD-10-CM | POA: Diagnosis not present

## 2018-01-18 DIAGNOSIS — F458 Other somatoform disorders: Secondary | ICD-10-CM | POA: Diagnosis not present

## 2018-01-18 DIAGNOSIS — K210 Gastro-esophageal reflux disease with esophagitis: Secondary | ICD-10-CM | POA: Diagnosis not present

## 2018-03-07 DIAGNOSIS — L600 Ingrowing nail: Secondary | ICD-10-CM | POA: Diagnosis not present

## 2018-03-07 DIAGNOSIS — M79674 Pain in right toe(s): Secondary | ICD-10-CM | POA: Diagnosis not present

## 2018-03-07 DIAGNOSIS — M79671 Pain in right foot: Secondary | ICD-10-CM | POA: Diagnosis not present

## 2018-03-08 ENCOUNTER — Ambulatory Visit (INDEPENDENT_AMBULATORY_CARE_PROVIDER_SITE_OTHER): Payer: BLUE CROSS/BLUE SHIELD | Admitting: Gastroenterology

## 2018-03-08 ENCOUNTER — Encounter: Payer: Self-pay | Admitting: Gastroenterology

## 2018-03-08 DIAGNOSIS — K219 Gastro-esophageal reflux disease without esophagitis: Secondary | ICD-10-CM

## 2018-03-08 DIAGNOSIS — K582 Mixed irritable bowel syndrome: Secondary | ICD-10-CM | POA: Diagnosis not present

## 2018-03-08 DIAGNOSIS — R1319 Other dysphagia: Secondary | ICD-10-CM

## 2018-03-08 DIAGNOSIS — K625 Hemorrhage of anus and rectum: Secondary | ICD-10-CM

## 2018-03-08 DIAGNOSIS — R1310 Dysphagia, unspecified: Secondary | ICD-10-CM

## 2018-03-08 NOTE — Assessment & Plan Note (Addendum)
SYMPTOMS FAIRLY WELL CONTROLLED.  AVOID REFLUX TRIGGERS.  HANDOUT GIVEN. CONTINUE NEXIUM. TAKE 30 MINUTES BEFORE MEALS TWICE DAILY.  CONTINUE ZANTAC AT BEDTIME MON-FRI.   FOLLOW UP IN 4 MOS.

## 2018-03-08 NOTE — Patient Instructions (Addendum)
AVOID REFLUX TRIGGERS. See info below.  CONTINUE NEXIUM. TAKE 30 MINUTES BEFORE MEALS TWICE DAILY.   CONTINUE ZANTAC AT BEDTIME MON-FRI.   FOLLOW UP IN 4 MOS.    Lifestyle and home remedies TO HELP CONTROL HEARTBURN.  You may eliminate or reduce the frequency of heartburn by making the following lifestyle changes:  . Control your weight. Being overweight is a major risk factor for heartburn and GERD. Excess pounds put pressure on your abdomen, pushing up your stomach and causing acid to back up into your esophagus.   . Eat smaller meals. 4 TO 6 MEALS A DAY. This reduces pressure on the lower esophageal sphincter, helping to prevent the valve from opening and acid from washing back into your esophagus.   [place] Earing your belt. Clothes that fit tightly around your waist put pressure on your abdomen and the lower esophageal sphincter.  .  . Eliminate heartburn triggers. Everyone has specific triggers.Common triggers such as fatty or fried foods, spicy food, tomato sauce, carbonated beverages, alcohol, chocolate, mint, garlic, onion, caffeine and nicotine may make heartburn worse.   Marland Kitchen Avoid stooping or bending. Tying your shoes is OK. Bending over for longer periods to weed your garden isn't, especially soon after eating.   . Don't lie down after a meal. Wait at least three to four hours after eating before going to bed, and don't lie down right after eating.   Marland Kitchen PLACE THE HEAD OF YOUR BED ON 6 INCH BLOCKS.  Alternative medicine . Several home remedies exist for treating GERD, but they provide only temporary relief. They include drinking baking soda (sodium bicarbonate) added to water or drinking other fluids such as baking soda mixed with cream of tartar and water. . Although these liquids create temporary relief by neutralizing, washing away or buffering acids, eventually they aggravate the situation by adding gas and fluid to your stomach, increasing pressure and causing more acid reflux.  Further, adding more sodium to your diet may increase your blood pressure and add stress to your heart, and excessive bicarbonate ingestion can alter the acid-base balance in your body.

## 2018-03-08 NOTE — Assessment & Plan Note (Signed)
Mixed symptoms. SYMPTOMS FAIRLY WELL CONTROLLED.  CONTINUE [name] or LINZESS. CONTINUE TO MONITOR SYMPTOMS. FOLLOW UP IN 4 MOS.

## 2018-03-08 NOTE — Assessment & Plan Note (Signed)
WEIGHT UP 5 LBS. SYMPTOMS FAIRLY WELL CONTROLLED.  CONTINUE TO MONITOR SYMPTOMS.

## 2018-03-08 NOTE — Assessment & Plan Note (Signed)
INTERMITTENT SYMPTOMS. SYMPTOMS FAIRLY WELL CONTROLLED.  CONTINUE TO MONITOR SYMPTOMS.

## 2018-03-08 NOTE — Progress Notes (Signed)
Subjective:    Patient ID: Yesenia Boyd, female    DOB: 16-Jul-1956, 62 y.o.   MRN: [id number]  Toma Deiters, MD   HPI Under stress. GAINING WEIGHT. SON BACK HOME. HEARTBURN: DAILY. DOING THE BEST SHE CAN WITH HER DIET. CAN DIGEST THE FRUIT. THE PEELINGS ONCE SHE CHEWS HER FOOD. IF ANY DOESN'T GET CHEWED UP RIGHT. IT COMES OUT WHOLE. MAY EAT CANTELOPE AND HAVE GI UPSET. LAST TIME RECTAL BLEEDING: NONE FOR PAS COUPLE OF MOS. BUTT FEELS GOD SOMETIME AND SOMETIMES NOT. WORST THING IS SPASM. NOT USING CREAM FOR PAST 4-5 MOS. NAUSEA: STAYS ALL THE TIME. DIARRHEA: NOT REALLY. MAY SEE MUCOUS, WATER, STOOLS(#1 TO #7 DEPENDING ON THE DAY. CHEST PAIN:  PRESSURE/TIGHTNESS, MAY TAKE 1-2 NTG SL, ASSOCIATED WITH EXHAUSTION/TIRED AND PASSED OUT. CONSTIPATION; PACKED AND CAN'T GET STOOL OUT AND GETS NAUSEATED. TAKES [name] AND OCCASIONALLY LINZESS AS WELL. PAIN IN MIDDLE OF ABDOMEN (STAYS WHERE SHE HAD SURGERY AND MAY FEEL A PULLING. SWALLOWING KIND OF OK ESPECIALLY IF SHE DRINKS WATER WHEN SHE EATS. MAY FEEL SOB FROM TIME TO TIME. USES ALBUTEROL PRN AND RECENTLY HAD A BAD ALLERGY FLARE.  PT DENIES FEVER, CHILLS, HEMATEMESIS, vomiting, melena, OR CHANGE IN BOWEL IN HABITS, problems swallowing.  Past Medical History:  Diagnosis Date  . Anginal pain (HCC)   . Asthma   . Bile reflux gastritis   . Complication of anesthesia    Deep depression following administration of propofol  . Depression    pt believes it's related to multiple sicknesses  . Family history of adverse reaction to anesthesia    sister required ventilatory support after tonsillectomy- told that " in the future need to be very careful with anesth."  . Fibromyalgia    sees [facility] Philis Kendall at [facility]  . GERD (gastroesophageal reflux disease)   . History of hiatal hernia    2 surgeries, Lap. Nissen Fundiplication   . IBS (irritable bowel syndrome)   . Magnesium deficiency   . Myasthenia gravis    right sided weakness occ.  . Myasthenia gravis  in remission (HCC)   . Neuropathy   . Numbness and tingling in right hand    and whole left side of body-pt sts she has "spells to where she cannot walk or talk"  . Peripheral neuropathy   . S/P colonoscopy April 2009, Sept 2012   normal Dr. Linna Darner, internal hemorrhoids, repeat in Sept 2022  . S/P endoscopy 2008, Aug 2010, Sept 2012   Dr. Linna Darner 2008: removal of impacted food bolus, Dr. Linna Darner 2010: moderate gastritis, Sept 2012 with SLF: path with mild gastritis, no definite stricture noted, dilation with Savary 16 mm  . Seizures (HCC)    Pt. reports -yes, pt. reports that she blanks out sometimes & the doctors have told her that's like a seizure    . Shortness of breath dyspnea    pt believes it's related to asthma  . Sleep apnea 2010   no cpap used, was told to get oxygen  to use, pt. lnows that she needs new evaluation   . Stroke [facility])    TIA 2010  . TIA (transient ischemic attack) 2012   Past Surgical History:  Procedure Laterality Date  . ABDOMINAL HYSTERECTOMY     complete per patient  . BLADDER SURGERY     stretch bladder opening  . CHOLECYSTECTOMY    . COLONOSCOPY  07/18/2011   SLF: 1. internal hemorrhoids  . COLONOSCOPY WITH PROPOFOL N/A 03/21/2017  Procedure: COLONOSCOPY WITH CHOICE ANESTHESIA;  Surgeon: West Bali, MD;  Location: [facility];  Service: Endoscopy;  Laterality: N/A;  10:45am  . ESOPHAGEAL MANOMETRY N/A 01/21/2013   Procedure: ESOPHAGEAL MANOMETRY (EM);  Surgeon: Valarie Merino, MD;  Location: [facility] ENDOSCOPY;  Service: General;  Laterality: N/A;  . esophageal surgery?     2008 [facility]  . ESOPHAGOGASTRODUODENOSCOPY  07/18/2011   SLF: 1. Moderate gastritis 2. Dysphagia most likely 2o large food bolus moving through her Nissen, which appears to be intact. Pt has poor denttition and NL BPE 2 years ago. 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  Marland Kitchen EVALUATION UNDER ANESTHESIA WITH HEMORRHOIDECTOMY N/A 03/27/2015   Procedure:  EXAM UNDER ANESTHESIA WITH SPHINCTEROTOMY ;  Surgeon: Luretha Murphy, MD;  Location: [facility] ORS;  Service: General;  Laterality: N/A;  . FLEXIBLE SIGMOIDOSCOPY N/A 12/09/2014   SLF: Rectal pain most likely due to internal and external hemorroids  . HAND SURGERY     carpal tumnnel right and removal of cyst left  . HEMORRHOID BANDING N/A 12/09/2014   Procedure: HEMORRHOID BANDING;  Surgeon: West Bali, MD;  Location: [facility] ORS;  Service: Endoscopy;  Laterality: N/A;  . LAPAROSCOPIC NISSEN FUNDOPLICATION N/A 02/13/2013   Procedure: Redo LAPAROSCOPIC NISSEN FUNDOPLICATION;  Surgeon: Valarie Merino, MD;  Location: [facility] ORS;  Service: General;  Laterality: N/A;  Forgut explortation with partial  takedown nissan funliplication from 1994 for wrap torsion and persistant dysphagia  . NASAL SEPTOPLASTY W/ TURBINOPLASTY Bilateral 08/03/2016   Procedure: NASAL SEPTOPLASTY WITH TURBINATE REDUCTION;  Surgeon: Newman Pies, MD;  Location: [facility] OR;  Service: ENT;  Laterality: Bilateral;  . NECK SURGERY     removal of "knot" from neck  . NISSEN FUNDOPLICATION     twice  . SAVORY DILATION  07/18/2011   Surgeon:Emileo Semel M Carolle Ishii    Allergies  Allergen Reactions  . Aspirin Other (See Comments)    Stomach distress, rash  . Avelox [Moxifloxacin Hcl In Nacl] Anaphylaxis and Itching  . Linzess [Linaclotide] Swelling    Swelling, bloating, nausea, BLISTERS IN HER MOUTH  . Penicillins Anaphylaxis and Hives      . Adhesive [Tape] Other (See Comments)    Red and itchy under tape  . Carbamazepine Other (See Comments)    Memory loss   . Cephalexin Hives and Itching  . Codeine Hives and Itching  . Cymbalta [Duloxetine Hcl] Other (See Comments)    Hair loss, nervousness, severe constipation, red blotches  . Darvon [Propoxyphene] Hives  . Dicyclomine Itching and Other (See Comments)    Nervousness  . Hydrocodone-Acetaminophen Itching  . Ketek [Telithromycin]   . Latex Itching  . Macrolides And Ketolides Swelling  . Meperidine  Hcl Itching and Other (See Comments)    Nervousness  . Metronidazole Hives and Other (See Comments)    Red blotches.  . Morphine Itching and Other (See Comments)    Nervousness  . Pepcid [Famotidine] Nausea And Vomiting  . Prednisone Hives, Swelling and Other (See Comments)    Red blotches - Can take with Benadryl  . Propofol Other (See Comments)    Depression   . Protonix [Pantoprazole] Swelling  . Sulfonamide Derivatives Hives  . Tramadol Hcl Hives and Other (See Comments)    Nervousness  . Zylet [Loteprednol-Tobramycin] Hives and Swelling    Current Outpatient Medications  Medication Sig    . acetaminophen (TYLENOL) 650 MG CR tablet Take 1,300 mg by mouth See admin instructions. Takes  2 tablets every night at bedtime. Takes 2 tablets every 8 hours as needed for pain.    Marland Kitchen albuterol (PROVENTIL HFA;VENTOLIN HFA) 108 (90 Base) MCG/ACT inhaler Inhale 1-2 puffs into the lungs every 6 (six) hours as needed for wheezing or shortness of breath.     . Artificial Tear Solution (SOOTHE XP OP) Place 2 drops into both eyes 2 (two) times daily as needed (dry eyes).    Marland Kitchen azelastine (OPTIVAR) 0.05 % ophthalmic solution Place 2 drops into both eyes 2 (two) times daily as needed for allergies or itching.    . beclomethasone (BECONASE-AQ) 42 MCG/SPRAY nasal spray Place 1 spray into both nostrils 2 (two) times daily. Dose is for each nostril.    Marland Kitchen CARAFATE 1 GM/10ML suspension Take 1 g by mouth 4 (four) times daily as needed (heartburn, reflux).     . Cholecalciferol ([employer] VIT D 5000 HIGH-POTENCY) 5000 UNITS capsule Take 10,000 Units by mouth every morning.     . compounded topicals builder Apply 1 application topically 2 (two) times daily as needed (rash). Triamcinolone 0.1%, LCD 10%, SA 2% cream. Compounded by [facility].    . Cyanocobalamin (VITAMELTS ENERGY VITAMIN B-12) 1500 MCG TBDP Take 1,500 mcg by mouth daily.    Marland Kitchen diltiazem 2 % GEL Apply 1 application topically 4 (four) times daily.    Marland Kitchen  EPINEPHrine 0.3 mg/0.3 mL IJ SOAJ injection Inject 0.3 mg into the muscle as needed (allergic reaction).    Marland Kitchen esomeprazole (NEXIUM) 40 MG capsule 1 PO 30 MINUTES PRIOR TO MEALS BID for 3 mos then 1 po qd    . estrogens, conjugated, (PREMARIN) 0.625 MG tablet Take 0.625 mg by mouth at bedtime.     . fluticasone (CUTIVATE) 0.05 % cream APPLY TO THE AFFECTED AREA(S) UP TO TWICE DAILY AS NEEDED    . hydrOXYzine (VISTARIL) 25 MG capsule Take 25 mg by mouth at bedtime.     Marland Kitchen L-Methylfolate-B6-B12 (FOLTANX) 3-35-2 MG TABS Take 1 tablet by mouth daily.    Marland Kitchen L-Methylfolate-B6-B12 (METANX PO) Take 1 tablet by mouth at bedtime.     Marland Kitchen levocetirizine (XYZAL) 5 MG tablet Take 5 mg by mouth every evening.    . loratadine (CLARITIN) 10 MG tablet Take 10 mg by mouth daily.     Marland Kitchen lubiprostone ([name]) 24 MCG capsule Take 24 mcg by mouth 2 (two) times daily with a meal.      . magnesium citrate solution Take 148-296 mLs by mouth daily as needed (constipation). For blockage    . montelukast (SINGULAIR) 10 MG tablet Take 10 mg by mouth at bedtime.    . nitroGLYCERIN (NITROSTAT) 0.4 MG SL tablet Place 0.4 mg under the tongue every 5 (five) minutes as needed for chest pain.    . ranitidine (ZANTAC) 300 MG tablet Take 300 mg by mouth at bedtime.    . simethicone (MYLICON) 125 MG chewable tablet Chew 125 mg by mouth every 6 (six) hours as needed for flatulence.    Marland Kitchen tiZANidine (ZANAFLEX) 4 MG tablet Take 4 mg by mouth at bedtime.     . triamcinolone (KENALOG) 0.1 % cream Apply 1 application topically 2 (two) times daily as needed (rash on scalp).     . vitamin E 1000 UNIT capsule Take 1,000 Units by mouth at bedtime.      Review of Systems PER HPI OTHERWISE ALL SYSTEMS ARE NEGATIVE.    Objective:   Physical Exam  Constitutional: She is oriented to person, place,  and time. She appears well-developed and well-nourished. No distress.  HENT:  Head: Normocephalic and atraumatic.  Mouth/Throat: Oropharynx is clear and  moist. No oropharyngeal exudate.  Eyes: Pupils are equal, round, and reactive to light. No scleral icterus.  Neck: Normal range of motion. Neck supple.  Cardiovascular: Normal rate, regular rhythm and normal heart sounds.  Pulmonary/Chest: Effort normal and breath sounds normal. No respiratory distress.  Abdominal: Soft. Bowel sounds are normal. She exhibits no distension. There is tenderness (MILD IN LUQ/RLQ). There is no rebound and no guarding.  Musculoskeletal: She exhibits edema (TRCAE BILATERAL LOWER EXTREMTITIS).  Lymphadenopathy:    She has no cervical adenopathy.  Neurological: She is alert and oriented to person, place, and time.  NO  NEW FOCAL DEFICITS  Psychiatric: She has a normal mood and affect.  Vitals reviewed.     Assessment & Plan:

## 2018-03-12 NOTE — Progress Notes (Signed)
ON RECALL  °

## 2018-03-12 NOTE — Progress Notes (Signed)
CC'D TO PCP °

## 2018-03-22 DIAGNOSIS — M722 Plantar fascial fibromatosis: Secondary | ICD-10-CM | POA: Diagnosis not present

## 2018-03-22 DIAGNOSIS — M79672 Pain in left foot: Secondary | ICD-10-CM | POA: Diagnosis not present

## 2018-04-11 ENCOUNTER — Other Ambulatory Visit: Payer: Self-pay | Admitting: Gastroenterology

## 2018-04-12 DIAGNOSIS — M722 Plantar fascial fibromatosis: Secondary | ICD-10-CM | POA: Diagnosis not present

## 2018-04-12 DIAGNOSIS — M79671 Pain in right foot: Secondary | ICD-10-CM | POA: Diagnosis not present

## 2018-04-12 DIAGNOSIS — M2010 Hallux valgus (acquired), unspecified foot: Secondary | ICD-10-CM | POA: Diagnosis not present

## 2018-04-23 DIAGNOSIS — F458 Other somatoform disorders: Secondary | ICD-10-CM | POA: Diagnosis not present

## 2018-04-23 DIAGNOSIS — Z6827 Body mass index (BMI) 27.0-27.9, adult: Secondary | ICD-10-CM | POA: Diagnosis not present

## 2018-04-23 DIAGNOSIS — J301 Allergic rhinitis due to pollen: Secondary | ICD-10-CM | POA: Diagnosis not present

## 2018-04-23 DIAGNOSIS — M722 Plantar fascial fibromatosis: Secondary | ICD-10-CM | POA: Diagnosis not present

## 2018-05-03 DIAGNOSIS — M79671 Pain in right foot: Secondary | ICD-10-CM | POA: Diagnosis not present

## 2018-05-03 DIAGNOSIS — M722 Plantar fascial fibromatosis: Secondary | ICD-10-CM | POA: Diagnosis not present

## 2018-05-09 ENCOUNTER — Encounter (HOSPITAL_COMMUNITY): Payer: Self-pay

## 2018-05-09 ENCOUNTER — Encounter: Payer: Self-pay | Admitting: Vascular Surgery

## 2018-05-17 DIAGNOSIS — M79671 Pain in right foot: Secondary | ICD-10-CM | POA: Diagnosis not present

## 2018-05-17 DIAGNOSIS — M722 Plantar fascial fibromatosis: Secondary | ICD-10-CM | POA: Diagnosis not present

## 2018-06-26 DIAGNOSIS — J301 Allergic rhinitis due to pollen: Secondary | ICD-10-CM | POA: Diagnosis not present

## 2018-06-26 DIAGNOSIS — K210 Gastro-esophageal reflux disease with esophagitis: Secondary | ICD-10-CM | POA: Diagnosis not present

## 2018-06-26 DIAGNOSIS — F458 Other somatoform disorders: Secondary | ICD-10-CM | POA: Diagnosis not present

## 2018-06-26 DIAGNOSIS — Z6827 Body mass index (BMI) 27.0-27.9, adult: Secondary | ICD-10-CM | POA: Diagnosis not present

## 2018-06-28 ENCOUNTER — Other Ambulatory Visit: Payer: Self-pay

## 2018-06-28 DIAGNOSIS — I83893 Varicose veins of bilateral lower extremities with other complications: Secondary | ICD-10-CM

## 2018-07-04 DIAGNOSIS — H04123 Dry eye syndrome of bilateral lacrimal glands: Secondary | ICD-10-CM | POA: Diagnosis not present

## 2018-07-11 ENCOUNTER — Encounter: Payer: Self-pay | Admitting: Vascular Surgery

## 2018-07-11 ENCOUNTER — Encounter (HOSPITAL_COMMUNITY): Payer: Self-pay

## 2018-07-25 DIAGNOSIS — K210 Gastro-esophageal reflux disease with esophagitis: Secondary | ICD-10-CM | POA: Diagnosis not present

## 2018-07-25 DIAGNOSIS — Z6826 Body mass index (BMI) 26.0-26.9, adult: Secondary | ICD-10-CM | POA: Diagnosis not present

## 2018-07-25 DIAGNOSIS — F458 Other somatoform disorders: Secondary | ICD-10-CM | POA: Diagnosis not present

## 2018-07-25 DIAGNOSIS — J301 Allergic rhinitis due to pollen: Secondary | ICD-10-CM | POA: Diagnosis not present

## 2018-08-07 ENCOUNTER — Other Ambulatory Visit: Payer: Self-pay

## 2018-08-07 ENCOUNTER — Encounter

## 2018-08-07 ENCOUNTER — Ambulatory Visit (INDEPENDENT_AMBULATORY_CARE_PROVIDER_SITE_OTHER): Payer: BLUE CROSS/BLUE SHIELD | Admitting: Gastroenterology

## 2018-08-07 ENCOUNTER — Telehealth: Payer: Self-pay

## 2018-08-07 ENCOUNTER — Encounter: Payer: Self-pay | Admitting: Gastroenterology

## 2018-08-07 VITALS — BP 118/75 | HR 69 | Temp 97.0°F | Ht 63.0 in | Wt 158.4 lb

## 2018-08-07 DIAGNOSIS — K219 Gastro-esophageal reflux disease without esophagitis: Secondary | ICD-10-CM

## 2018-08-07 DIAGNOSIS — R1319 Other dysphagia: Secondary | ICD-10-CM

## 2018-08-07 DIAGNOSIS — R1310 Dysphagia, unspecified: Secondary | ICD-10-CM | POA: Diagnosis not present

## 2018-08-07 NOTE — Progress Notes (Signed)
cc'd to pcp 

## 2018-08-07 NOTE — Telephone Encounter (Signed)
Tried to call pt to inform of pre-op appt 08/14/18 at 3:15pm, no answer, LMOVM. Letter mailed.

## 2018-08-07 NOTE — Assessment & Plan Note (Addendum)
62 year old female with remote Niesen fundoplication, redo in 2014 for chronic dysphagia who presents with significant heartburn, worsening dysphagia, regurgitation.  She is on Nexium 40 mg twice daily, Mylanta 3 times daily, Carafate 4 times daily as needed, Zantac 300 mg at bedtime.  She developed discomfort in the lower chest when the food does not want to go down, sometimes she is able to wash it down but often the food just comes back up.  Symptoms are becoming more frequent.  Cannot exclude possibility of esophageal stricture in addition to symptoms related to her wrap.  Her last EGD was in 2012.  Offered her an upper endoscopy with esophageal dilation in the near future with propofol.  Patient accepts risk of propofol as she tolerated it well last time without any significant depression knowing that she has a little options to multiple drug allergies.  I have discussed the risks, alternatives, benefits with regards to but not limited to the risk of reaction to medication, bleeding, infection, perforation and the patient is agreeable to proceed. Written consent to be obtained.

## 2018-08-07 NOTE — Patient Instructions (Signed)
Upper endoscopy as scheduled. See separate instructions.  

## 2018-08-07 NOTE — Progress Notes (Addendum)
REVIEWED-NO ADDITIONAL RECOMMENDATIONS.   Primary Care Physician: Toma Deiters, MD  Primary Gastroenterologist:  Jonette Eva, MD   Chief Complaint  Patient presents with  . Gastroesophageal Reflux    c/o vomiting "slimy mess" after eating, lots of burning in upper abd  . Nausea    HPI: Yesenia Boyd is a 63 y.o. female here with persistent dysphagia, GERD, regurgitation.  History of remote Nissen fundoplication in the 1990s, redo in 2014 for chronic dysphagia.  Patient last seen in May by our practice.  She presents today with daily complaints, difficulty swallowing.  Food will often get stuck in the lower chest, if she is not able to wash it down, food will work its way back up and she regurgitates or vomits.  "Slimy mess" comes up.  Only develops the symptoms with eating.  Happens frequently.  Often avoids eating typically one meal a day because of the symptoms.  Associated pain in the chest when this happens.  Has a lot of burning even on twice daily Nexium.  Bowel movement typically once per day on [name] 24 mcg twice daily.  Notes that occasionally Linzess 290 mcg that her PCP provides her with.  She states she has gotten mouth blisters in the past when she took it regularly but she tolerates it okay if she takes it on occasion but it is the only thing that seems to get her bowels moving really well.  Denies blood in the stool or melena.  She has numerous drug allergies.  She states that in the past she been into a "deep depression" after propofol.  She tolerated propofol last year however.  Weight up 8 pounds in the past year.   Current Outpatient Medications  Medication Sig Dispense Refill  . acetaminophen (TYLENOL) 650 MG CR tablet Take 1,300 mg by mouth See admin instructions. Takes 2 tablets every night at bedtime. Takes 2 tablets every 8 hours as needed for pain.    Marland Kitchen albuterol (PROVENTIL HFA;VENTOLIN HFA) 108 (90 Base) MCG/ACT inhaler Inhale 1-2 puffs into the lungs  every 6 (six) hours as needed for wheezing or shortness of breath.     Marland Kitchen alum & mag hydroxide-simeth (MYLANTA) 200-200-20 MG/5ML suspension Take by mouth 2 (two) times daily.    . Artificial Tear Solution (SOOTHE XP OP) Place 2 drops into both eyes 2 (two) times daily as needed (dry eyes).    Marland Kitchen azelastine (OPTIVAR) 0.05 % ophthalmic solution Place 2 drops into both eyes 2 (two) times daily as needed for allergies or itching.    . beclomethasone (BECONASE-AQ) 42 MCG/SPRAY nasal spray Place 1 spray into both nostrils 2 (two) times daily. Dose is for each nostril.    Marland Kitchen CARAFATE 1 GM/10ML suspension Take 1 g by mouth 4 (four) times daily as needed (heartburn, reflux).     . Cholecalciferol ([employer] VIT D 5000 HIGH-POTENCY) 5000 UNITS capsule Take 10,000 Units by mouth every morning.     . Coenzyme Q10 (COQ10 PO) Take by mouth. Liquid 2 tsp daily    . compounded topicals builder Apply 1 application topically 2 (two) times daily as needed (rash). Triamcinolone 0.1%, LCD 10%, SA 2% cream. Compounded by [facility].    . Cyanocobalamin (VITAMELTS ENERGY VITAMIN B-12) 1500 MCG TBDP Take 1,500 mcg by mouth daily.    Marland Kitchen diltiazem 2 % GEL Apply 1 application topically 4 (four) times daily.    Marland Kitchen EPINEPHrine 0.3 mg/0.3 mL IJ SOAJ injection Inject 0.3 mg into the  muscle as needed (allergic reaction).    Marland Kitchen esomeprazole (NEXIUM) 40 MG capsule Take 1 capsule (40 mg total) by mouth 2 (two) times daily before a meal. 60 capsule 11  . estrogens, conjugated, (PREMARIN) 0.625 MG tablet Take 0.625 mg by mouth at bedtime.     . fluticasone (CUTIVATE) 0.05 % cream APPLY TO THE AFFECTED AREA(S) UP TO TWICE DAILY AS NEEDED  3  . Glucosamine-Chondroitin (MOVE FREE PO) Take 1 tablet by mouth daily.    . hydrOXYzine (VISTARIL) 25 MG capsule Take 25 mg by mouth at bedtime.     Marland Kitchen L-Methylfolate-B6-B12 (FOLTANX) 3-35-2 MG TABS Take 1 tablet by mouth daily.  0  . loratadine (CLARITIN) 10 MG tablet Take 10 mg by mouth daily.     Marland Kitchen  lubiprostone ([name]) 24 MCG capsule Take 24 mcg by mouth 2 (two) times daily with a meal.      . magnesium citrate solution Take 148-296 mLs by mouth daily as needed (constipation). For blockage    . montelukast (SINGULAIR) 10 MG tablet Take 10 mg by mouth at bedtime.    . nitroGLYCERIN (NITROSTAT) 0.4 MG SL tablet Place 0.4 mg under the tongue every 5 (five) minutes as needed for chest pain.    . ranitidine (ZANTAC) 300 MG tablet Take 300 mg by mouth at bedtime.    . simethicone (MYLICON) 125 MG chewable tablet Chew 125 mg by mouth every 6 (six) hours as needed for flatulence.    Marland Kitchen tiZANidine (ZANAFLEX) 4 MG tablet Take 4 mg by mouth at bedtime.     . triamcinolone (KENALOG) 0.1 % cream Apply 1 application topically 2 (two) times daily as needed (rash on scalp).     . vitamin E 1000 UNIT capsule Take 1,000 Units by mouth at bedtime.      No current facility-administered medications for this visit.     Allergies as of 08/07/2018 - Review Complete 08/07/2018  Allergen Reaction Noted  . Aspirin Other (See Comments) 07/28/2016  . Avelox [moxifloxacin hcl in nacl] Anaphylaxis and Itching 06/20/2011  . Linzess [linaclotide] Swelling 02/04/2013  . Penicillins Anaphylaxis and Hives   . Adhesive [tape] Other (See Comments) 01/24/2013  . Carbamazepine Other (See Comments) 03/16/2015  . Cephalexin Hives and Itching   . Codeine Hives and Itching   . Cymbalta [duloxetine hcl] Other (See Comments) 06/20/2011  . Darvon [propoxyphene] Hives 03/17/2015  . Dicyclomine Itching and Other (See Comments) 06/20/2011  . Hydrocodone-acetaminophen Itching 03/15/2017  . Ketek [telithromycin]  09/06/2017  . Latex Itching 01/24/2013  . Macrolides and ketolides Swelling 07/18/2011  . Meperidine hcl Itching and Other (See Comments)   . Metronidazole Hives and Other (See Comments) 06/20/2011  . Morphine Itching and Other (See Comments)   . Pepcid [famotidine] Nausea And Vomiting 03/16/2015  . Prednisone Hives,  Swelling, and Other (See Comments) 06/20/2011  . Propofol Other (See Comments) 03/17/2015  . Protonix [pantoprazole] Swelling 03/17/2015  . Sulfonamide derivatives Hives   . Tramadol hcl Hives and Other (See Comments)   . Zylet [loteprednol-tobramycin] Hives and Swelling 03/15/2017   Past Medical History:  Diagnosis Date  . Anginal pain (HCC)   . Asthma   . Bile reflux gastritis   . Complication of anesthesia    Deep depression following administration of propofol  . Depression    pt believes it's related to multiple sicknesses  . Family history of adverse reaction to anesthesia    sister required ventilatory support after tonsillectomy- told that " in  the future need to be very careful with anesth."  . Fibromyalgia    sees Dr Philis Kendall at [facility]  . GERD (gastroesophageal reflux disease)   . History of hiatal hernia    2 surgeries, Lap. Nissen Fundiplication   . IBS (irritable bowel syndrome)   . Magnesium deficiency   . Myasthenia gravis    right sided weakness occ.  . Myasthenia gravis in remission (HCC)   . Neuropathy   . Numbness and tingling in right hand    and whole left side of body-pt sts she has "spells to where she cannot walk or talk"  . Peripheral neuropathy   . S/P colonoscopy April 2009, Sept 2012   normal Dr. Linna Darner, internal hemorrhoids, repeat in Sept 2022  . S/P endoscopy 2008, Aug 2010, Sept 2012   Dr. Linna Darner 2008: removal of impacted food bolus, Dr. Linna Darner 2010: moderate gastritis, Sept 2012 with SLF: path with mild gastritis, no definite stricture noted, dilation with Savary 16 mm  . Seizures (HCC)    Pt. reports -yes, pt. reports that she blanks out sometimes & the doctors have told her that's like a seizure    . Shortness of breath dyspnea    pt believes it's related to asthma  . Sleep apnea 2010   no cpap used, was told to get oxygen  to use, pt. lnows that she needs new evaluation   . Stroke [facility])    TIA 2010  . TIA (transient ischemic attack) 2012   Past  Surgical History:  Procedure Laterality Date  . ABDOMINAL HYSTERECTOMY     complete per patient  . BLADDER SURGERY     stretch bladder opening  . CHOLECYSTECTOMY    . COLONOSCOPY  07/18/2011   SLF: 1. internal hemorrhoids  . COLONOSCOPY WITH PROPOFOL N/A 03/21/2017   Dr. Darrick Penna: Redundant left colon, hemorrhoids, next colonoscopy in 10 years.  Patient reports deep pression after propofol.  . ESOPHAGEAL MANOMETRY N/A 01/21/2013   Procedure: ESOPHAGEAL MANOMETRY (EM);  Surgeon: Valarie Merino, MD;  Location: [facility];  Service: General;  Laterality: N/A;  . esophageal surgery?     2008 [facility]  . ESOPHAGOGASTRODUODENOSCOPY  07/18/2011   SLF: 1. Moderate gastritis 2. Dysphagia most likely 2o large food bolus moving through her Nissen, which appears to be intact. Pt has poor denttition and NL BPE 2 years ago. 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  Marland Kitchen EVALUATION UNDER ANESTHESIA WITH HEMORRHOIDECTOMY N/A 03/27/2015   Procedure: EXAM UNDER ANESTHESIA WITH SPHINCTEROTOMY ;  Surgeon: Luretha Murphy, MD;  Location: [facility];  Service: General;  Laterality: N/A;  . FLEXIBLE SIGMOIDOSCOPY N/A 12/09/2014   SLF: Rectal pain most likely due to internal and external hemorroids  . HAND SURGERY     carpal tumnnel right and removal of cyst left  . HEMORRHOID BANDING N/A 12/09/2014   Procedure: HEMORRHOID BANDING;  Surgeon: West Bali, MD;  Location: [facility];  Service: Endoscopy;  Laterality: N/A;  . LAPAROSCOPIC NISSEN FUNDOPLICATION N/A 02/13/2013   Procedure: Redo LAPAROSCOPIC NISSEN FUNDOPLICATION;  Surgeon: Valarie Merino, MD;  Location: [facility];  Service: General;  Laterality: N/A;  Forgut explortation with partial  takedown nissan funliplication from 1994 for wrap torsion and persistant dysphagia  . NASAL SEPTOPLASTY W/ TURBINOPLASTY Bilateral 08/03/2016   Procedure: NASAL SEPTOPLASTY WITH TURBINATE REDUCTION;  Surgeon: Newman Pies, MD;  Location: [facility];  Service: ENT;   Laterality: Bilateral;  . NECK SURGERY  removal of "knot" from neck  . NISSEN FUNDOPLICATION     initially in 1994. redo in 2014  . SAVORY DILATION  07/18/2011   Surgeon:Sandi M Fields   Family History  Problem Relation Age of Onset  . Anesthesia problems Sister   . Cancer Sister        breast  . Hypertension Sister   . Parkinson's disease Mother   . Kidney disease Mother   . Diabetes Mother   . Heart attack Father   . Cancer Father        stomach cancer  . Hypertension Brother   . Diabetes Brother   . Arthritis Brother   . Kidney disease Brother   . Parkinson's disease Brother   . Cancer Maternal Aunt        breast  . Cancer Maternal Grandmother        breast  . Multiple sclerosis Daughter   . Colon cancer Cousin        4 cousins  . Pseudochol deficiency Neg Hx   . Hypotension Neg Hx   . Malignant hyperthermia Neg Hx    Social History   Tobacco Use  . Smoking status: Never Smoker  . Smokeless tobacco: Never Used  Substance Use Topics  . Alcohol use: Yes    Comment: drinks wine "every blue moon"  . Drug use: No    ROS:  General: Negative for anorexia, weight loss, fever, chills, fatigue, weakness. ENT: Negative for hoarseness,  nasal congestion.see hpi CV: Negative for chest pain, angina, palpitations, dyspnea on exertion, peripheral edema.  Respiratory: Negative for dyspnea at rest, dyspnea on exertion, cough, sputum, wheezing.  GI: See history of present illness. GU:  Negative for dysuria, hematuria, urinary incontinence, urinary frequency, nocturnal urination.  Endo: Negative for unusual weight change.    Physical Examination:   BP 118/75   Pulse 69   Temp (!) 97 F (36.1 C) (Oral)   Ht 5\' 3"  (1.6 m)   Wt 158 lb 6.4 oz (71.8 kg)   BMI 28.06 kg/m   General: Well-nourished, well-developed in no acute distress.  Eyes: No icterus. Mouth: Oropharyngeal mucosa moist and pink , no lesions erythema or exudate. Lungs: Clear to auscultation  bilaterally.  Heart: Regular rate and rhythm, no murmurs rubs or gallops.  Abdomen: Bowel sounds are normal, nontender, nondistended, no hepatosplenomegaly or masses, no abdominal bruits or hernia , no rebound or guarding.   Extremities: No lower extremity edema. No clubbing or deformities. Neuro: Alert and oriented x 4   Skin: Warm and dry, no jaundice.   Psych: Alert and cooperative, normal mood and affect.

## 2018-08-13 NOTE — Patient Instructions (Signed)
20    Your procedure is scheduled on: 08/21/2018  Report to [facility] at   7:00  AM.  Call this number if you have problems the morning of surgery: [phone]   Remember:   Do not drink or eat food:After Midnight.    Clear liquids include soda, tea, black coffee, apple or grape juice, broth.  Take these medicines the morning of surgery with A SIP OF WATER: Claritin, Zantac, Singulair and use albuterol inhaler if needed   Do not wear jewelry, make-up or nail polish.  Do not wear lotions, powders, or perfumes. You may wear deodorant.  Do not shave 48 hours prior to surgery. Men may shave face and neck.  Do not bring valuables to the hospital.  Contacts, dentures or bridgework may not be worn into surgery.  Leave suitcase in the car. After surgery it may be brought to your room.  For patients admitted to the hospital, checkout time is 11:00 AM the day of discharge.   Patients discharged the day of surgery will not be allowed to drive home.  Name and phone number of your driver:    Please read over the following fact sheets that you were given: Pain Booklet, Lab Information and Anesthesia Post-op Instructions   Endoscopy Care After Please read the instructions outlined below and refer to this sheet in the next few weeks. These discharge instructions provide you with general information on caring for yourself after you leave the hospital. Your doctor may also give you specific instructions. While your treatment has been planned according to the most current medical practices available, unavoidable complications occasionally occur. If you have any problems or questions after discharge, please call your doctor. HOME CARE INSTRUCTIONS Activity  You may resume your regular activity but move at a slower pace for the next 24 hours.   Take frequent rest periods for the next 24 hours.   Walking will help expel (get rid of) the air and reduce the bloated feeling in your abdomen.   No driving  for 24 hours (because of the anesthesia (medicine) used during the test).   You may shower.   Do not sign any important legal documents or operate any machinery for 24 hours (because of the anesthesia used during the test).  Nutrition  Drink plenty of fluids.   You may resume your normal diet.   Begin with a light meal and progress to your normal diet.   Avoid alcoholic beverages for 24 hours or as instructed by your caregiver.  Medications You may resume your normal medications unless your caregiver tells you otherwise. What you can expect today  You may experience abdominal discomfort such as a feeling of fullness or "gas" pains.   You may experience a sore throat for 2 to 3 days. This is normal. Gargling with salt water may help this.  Follow-up Your doctor will discuss the results of your test with you. SEEK IMMEDIATE MEDICAL CARE IF:  You have excessive nausea (feeling sick to your stomach) and/or vomiting.   You have severe abdominal pain and distention (swelling).   You have trouble swallowing.   You have a temperature over 100 F (37.8 C).   You have rectal bleeding or vomiting of blood.  Document Released: 06/07/2004 Document Revised: 10/13/2011 Document Reviewed: 12/19/2007

## 2018-08-14 ENCOUNTER — Encounter (HOSPITAL_COMMUNITY): Payer: Self-pay

## 2018-08-14 ENCOUNTER — Other Ambulatory Visit: Payer: Self-pay

## 2018-08-14 ENCOUNTER — Encounter (HOSPITAL_COMMUNITY)
Admission: RE | Admit: 2018-08-14 | Discharge: 2018-08-14 | Disposition: A | Discharge status: Home / Self Care | Payer: BLUE CROSS/BLUE SHIELD | Source: Ambulatory Visit | Attending: Gastroenterology | Admitting: Gastroenterology

## 2018-08-14 DIAGNOSIS — Z01818 Encounter for other preprocedural examination: Secondary | ICD-10-CM | POA: Insufficient documentation

## 2018-08-14 LAB — CBC
HCT: 34.9 % — ABNORMAL LOW (ref 36.0–46.0)
Hemoglobin: 11.7 g/dL — ABNORMAL LOW (ref 12.0–15.0)
MCH: 26.9 pg (ref 26.0–34.0)
MCHC: 33.5 g/dL (ref 30.0–36.0)
MCV: 80.2 fL (ref 80.0–100.0)
Platelets: 247 10*3/uL (ref 150–400)
RBC: 4.35 MIL/uL (ref 3.87–5.11)
RDW: 12.4 % (ref 11.5–15.5)
WBC: 5.2 10*3/uL (ref 4.0–10.5)
nRBC: 0 % (ref 0.0–0.2)

## 2018-08-14 LAB — BASIC METABOLIC PANEL
Anion gap: 11 (ref 5–15)
BUN: 12 mg/dL (ref 6–20)
CO2: 25 mmol/L (ref 22–32)
Calcium: 9.3 mg/dL (ref 8.9–10.3)
Chloride: 102 mmol/L (ref 98–111)
Creatinine, Ser: 0.87 mg/dL (ref 0.44–1.00)
GFR calc Af Amer: >60 mL/min (ref >60)
GFR calc non Af Amer: >60 mL/min (ref >60)
Glucose, Bld: 87 mg/dL (ref 70–99)
Potassium: 3.6 mmol/L (ref 3.5–5.1)
Sodium: 138 mmol/L (ref 135–145)

## 2018-08-21 ENCOUNTER — Ambulatory Visit (HOSPITAL_COMMUNITY): Payer: BLUE CROSS/BLUE SHIELD | Admitting: Anesthesiology

## 2018-08-21 ENCOUNTER — Ambulatory Visit (HOSPITAL_COMMUNITY)
Admission: RE | Admit: 2018-08-21 | Discharge: 2018-08-21 | Disposition: A | Discharge status: Home / Self Care | Payer: BLUE CROSS/BLUE SHIELD | Source: Ambulatory Visit | Attending: Gastroenterology | Admitting: Gastroenterology

## 2018-08-21 ENCOUNTER — Encounter (HOSPITAL_COMMUNITY)
Admission: RE | Disposition: A | Discharge status: Home / Self Care | Payer: Self-pay | Source: Ambulatory Visit | Attending: Gastroenterology

## 2018-08-21 ENCOUNTER — Encounter (HOSPITAL_COMMUNITY): Payer: Self-pay | Admitting: *Deleted

## 2018-08-21 DIAGNOSIS — Z79899 Other long term (current) drug therapy: Secondary | ICD-10-CM | POA: Diagnosis not present

## 2018-08-21 DIAGNOSIS — K589 Irritable bowel syndrome without diarrhea: Secondary | ICD-10-CM | POA: Insufficient documentation

## 2018-08-21 DIAGNOSIS — G629 Polyneuropathy, unspecified: Secondary | ICD-10-CM | POA: Diagnosis not present

## 2018-08-21 DIAGNOSIS — G4730 Sleep apnea, unspecified: Secondary | ICD-10-CM | POA: Insufficient documentation

## 2018-08-21 DIAGNOSIS — R1310 Dysphagia, unspecified: Secondary | ICD-10-CM | POA: Diagnosis not present

## 2018-08-21 DIAGNOSIS — J45909 Unspecified asthma, uncomplicated: Secondary | ICD-10-CM | POA: Diagnosis not present

## 2018-08-21 DIAGNOSIS — K2970 Gastritis, unspecified, without bleeding: Secondary | ICD-10-CM | POA: Insufficient documentation

## 2018-08-21 DIAGNOSIS — F329 Major depressive disorder, single episode, unspecified: Secondary | ICD-10-CM | POA: Diagnosis not present

## 2018-08-21 DIAGNOSIS — K219 Gastro-esophageal reflux disease without esophagitis: Secondary | ICD-10-CM | POA: Diagnosis not present

## 2018-08-21 DIAGNOSIS — G7000 Myasthenia gravis without (acute) exacerbation: Secondary | ICD-10-CM | POA: Diagnosis not present

## 2018-08-21 DIAGNOSIS — Z8673 Personal history of transient ischemic attack (TIA), and cerebral infarction without residual deficits: Secondary | ICD-10-CM | POA: Diagnosis not present

## 2018-08-21 DIAGNOSIS — M797 Fibromyalgia: Secondary | ICD-10-CM | POA: Diagnosis not present

## 2018-08-21 HISTORY — PX: ESOPHAGOGASTRODUODENOSCOPY (EGD) WITH PROPOFOL: SHX5813

## 2018-08-21 HISTORY — PX: SAVORY DILATION: SHX5439

## 2018-08-21 SURGERY — ESOPHAGOGASTRODUODENOSCOPY (EGD) WITH PROPOFOL
Anesthesia: Monitor Anesthesia Care

## 2018-08-21 MED ORDER — LIDOCAINE VISCOUS HCL 2 % MT SOLN
10.0000 mL | Freq: Once | OROMUCOSAL | Status: AC
  Administered 2018-08-21: 10 mL via OROMUCOSAL

## 2018-08-21 MED ORDER — LIDOCAINE VISCOUS HCL 2 % MT SOLN
OROMUCOSAL | Status: AC
  Filled 2018-08-21: qty 15

## 2018-08-21 MED ORDER — PROPOFOL 500 MG/50ML IV EMUL
INTRAVENOUS | Status: DC | PRN
  Administered 2018-08-21: 150 ug/kg/min via INTRAVENOUS

## 2018-08-21 MED ORDER — MINERAL OIL PO OIL
TOPICAL_OIL | ORAL | Status: AC
  Filled 2018-08-21: qty 30

## 2018-08-21 MED ORDER — LACTATED RINGERS IV SOLN
INTRAVENOUS | Status: DC
  Administered 2018-08-21: 08:00:00 via INTRAVENOUS

## 2018-08-21 MED ORDER — STERILE WATER FOR IRRIGATION IR SOLN
Status: DC | PRN
  Administered 2018-08-21: 100 mL

## 2018-08-21 MED ORDER — PROPOFOL 10 MG/ML IV BOLUS
INTRAVENOUS | Status: AC
  Filled 2018-08-21: qty 40

## 2018-08-21 MED ORDER — KETAMINE HCL 10 MG/ML IJ SOLN
INTRAMUSCULAR | Status: DC | PRN
  Administered 2018-08-21 (×2): 10 mg via INTRAVENOUS

## 2018-08-21 MED ORDER — KETAMINE HCL 10 MG/ML IJ SOLN
INTRAMUSCULAR | Status: AC
  Filled 2018-08-21: qty 1

## 2018-08-21 NOTE — Discharge Instructions (Signed)
I dilated your esophagus. I DID NOT SEE A DEFINITE NARROWING IN YOUR esophagus. You have gastritis.    DRINK WATER TO KEEP YOUR URINE LIGHT YELLOW.  FOLLOW A SOFT MECHANICAL DIET.  MEATS SHOULD BE CHOPPED OR GROUND ONLY. DO NOT EAT CHUNKS OF ANYTHING. SEE INFO BELOW.  CONTINUE NEXIUM AND ZANTAC.  PLEASE CALL IN ONE MONTH IF YOUR SYMPTOMS ARE NOT IMPROVED. YOU WILL NEED A BARIUM SWALLOW TEST.  FOLLOW UP IN 4 MOS. UPPER ENDOSCOPY AFTER CARE Read the instructions outlined below and refer to this sheet in the next week. These discharge instructions provide you with general information on caring for yourself after you leave the hospital. While your treatment has been planned according to the most current medical practices available, unavoidable complications occasionally occur. If you have any problems or questions after discharge, call DR. Caylei Sperry, [phone].  ACTIVITY  You may resume your regular activity, but move at a slower pace for the next 24 hours.   Take frequent rest periods for the next 24 hours.   Walking will help get rid of the air and reduce the bloated feeling in your belly (abdomen).   No driving for 24 hours (because of the medicine (anesthesia) used during the test).   You may shower.   Do not sign any important legal documents or operate any machinery for 24 hours (because of the anesthesia used during the test).    NUTRITION  Drink plenty of fluids.   You may resume your normal diet as instructed by your doctor.   Begin with a light meal and progress to your normal diet. Heavy or fried foods are harder to digest and may make you feel sick to your stomach (nauseated).   Avoid alcoholic beverages for 24 hours or as instructed.    MEDICATIONS  You may resume your normal medications.   WHAT YOU CAN EXPECT TODAY  Some feelings of bloating in the abdomen.   Passage of more gas than usual.    IF YOU HAD A BIOPSY TAKEN DURING THE UPPER ENDOSCOPY:  Eat  a soft diet IF YOU HAVE NAUSEA, BLOATING, ABDOMINAL PAIN, OR VOMITING.    FINDING OUT THE RESULTS OF YOUR TEST Not all test results are available during your visit. DR. Darrick Penna WILL CALL YOU WITHIN 7 DAYS OF YOUR PROCEDUE WITH YOUR RESULTS. Do not assume everything is normal if you have not heard from DR. Aviela Blundell IN ONE WEEK, CALL HER OFFICE AT [phone].  SEEK IMMEDIATE MEDICAL ATTENTION AND CALL THE OFFICE: [phone] IF:  You have more than a spotting of blood in your stool.   Your belly is swollen (abdominal distention).   You are nauseated or vomiting.   You have a temperature over 101F.   You have abdominal pain or discomfort that is severe or gets worse throughout the day.  Gastritis  Gastritis is an inflammation (the body's way of reacting to injury and/or infection) of the stomach. It is often caused by viral or bacterial (germ) infections. It can also be caused BY ASPIRIN, BC/GOODY POWDER'S, (IBUPROFEN) MOTRIN, OR ALEVE (NAPROXEN), chemicals (including alcohol), SPICY FOODS, and medications. This illness may be associated with generalized malaise (feeling tired, not well), UPPER ABDOMINAL STOMACH cramps, and fever. One common bacterial cause of gastritis is an organism known as H. Pylori. This can be treated with antibiotics.    DYSPHAGIA DYSPHAGIA can be caused by stomach acid backing up into the tube that carries food from the mouth down to the stomach (lower  esophagus) OR PRIOR REFLUX SURGERY.  TREATMENT There are a number of medicines used to treat DYSPHAGIA including: Antacids.  Proton-pump inhibitors: ESOMEPRAZOLE ZANTAC OR PEPCID  HOME CARE INSTRUCTIONS Eat 2-3 hours before going to bed.  Try to reach and maintain a healthy weight.  FOLLOW A SOFT MECHANICAL DIET. Do not eat just a few very large meals. Instead, eat 4 TO 6 smaller meals throughout the day.  Try to identify foods and beverages that make your symptoms worse, and avoid these.  Avoid tight  clothing.  Do not exercise right after eating.

## 2018-08-21 NOTE — Op Note (Signed)
[facility] Patient Name: Yesenia Boyd Procedure Date: 08/21/2018 8:04 AM MRN: [id number] Date of Birth: 12/10/1955 Attending MD: Jonette Eva MD, MD CSN: [id number] Age: 62 Admit Type: Outpatient Procedure:                Upper GI endoscopy WITH ESOPHAGEAL DILATION Indications:              Dysphagia Providers:                Jonette Eva MD, MD, Edrick Kins, RN, Burke Keels, Technician Referring MD:             Lia Hopping MD, MD Medicines:                Propofol per Anesthesia Complications:            No immediate complications. Estimated Blood Loss:     Estimated blood loss was minimal. Procedure:                Pre-Anesthesia Assessment:                           - Prior to the procedure, a History and Physical                            was performed, and patient medications and                            allergies were reviewed. The patient's tolerance of                            previous anesthesia was also reviewed. The risks                            and benefits of the procedure and the sedation                            options and risks were discussed with the patient.                            All questions were answered, and informed consent                            was obtained. Prior Anticoagulants: The patient has                            taken no previous anticoagulant or antiplatelet                            agents. ASA Grade Assessment: II - A patient with                            mild systemic disease. After reviewing the risks  and benefits, the patient was deemed in                            satisfactory condition to undergo the procedure.                            After obtaining informed consent, the endoscope was                            passed under direct vision. Throughout the                            procedure, the patient's blood pressure, pulse, and           oxygen saturations were monitored continuously. The                            [id number]-[id number] ([id number]) scope was introduced through the                            mouth, and advanced to the second part of duodenum.                            The upper GI endoscopy was accomplished without                            difficulty. The patient tolerated the procedure                            well. Scope In: 8:31:23 AM Scope Out: 8:39:23 AM Total Procedure Duration: 0 hours 8 minutes 0 seconds  Findings:      No endoscopic abnormality was evident in the esophagus to explain the       patient's complaint of dysphagia. It was decided, however, to proceed       with dilation of the entire esophagus. A guidewire was placed and the       scope was withdrawn. Dilation was performed with a Savary dilator with       mild resistance at 16 mm and 17 mm. Estimated blood loss was minimal.      Patchy mild inflammation characterized by congestion (edema) and       erythema was found on the lesser curvature of the stomach and in the       gastric antrum. LIMITED EXAM OF GASTRIC MUCOSA DUE TO RETAINED CONTENTS       IN BODY AND FUNDUS.      The examined duodenum was normal. Impression:               - No endoscopic esophageal abnormality to explain                            patient's dysphagia. Esophagus dilated. Dilated.                           - MILD Gastritis. Moderate Sedation:      Per Anesthesia Care Recommendation:           -  Patient has a contact number available for                            emergencies. The signs and symptoms of potential                            delayed complications were discussed with the                            patient. Return to normal activities tomorrow.                            Written discharge instructions were provided to the                            patient.                           - Soft diet. MEATS CHOPPED OR GROUND ONLY.                            - Continue present medications. CALL IN ONE MONTH                            IF DYSPHAGIA NOT IMPROVED AND PT WILL NEED BPE.                           - Return to my office in 4 months. Procedure Code(s):        --- Professional ---                           [phone], Esophagogastroduodenoscopy, flexible,                            transoral; with insertion of guide wire followed by                            passage of dilator(s) through esophagus over guide                            wire Diagnosis Code(s):        --- Professional ---                           R13.10, Dysphagia, unspecified                           K29.70, Gastritis, unspecified, without bleeding CPT copyright 2018 American [employer] Association. All rights reserved. The codes documented in this report are preliminary and upon coder review may  be revised to meet current compliance requirements. Jonette Eva, MD Jonette Eva MD, MD 08/21/2018 8:49:48 AM This report has been signed electronically. Number of Addenda: 0

## 2018-08-21 NOTE — Anesthesia Preprocedure Evaluation (Signed)
Anesthesia Evaluation  Patient identified by MRN, date of birth, ID band Patient awake    Reviewed: Allergy & Precautions, H&P , NPO status , Patient's Chart, lab work & pertinent test results, reviewed documented beta blocker date and time   History of Anesthesia Complications (+) Family history of anesthesia reaction and history of anesthetic complications  Airway Mallampati: II  TM Distance: >3 FB Neck ROM: full    Dental no notable dental hx.    Pulmonary neg pulmonary ROS, shortness of breath, asthma , sleep apnea ,    Pulmonary exam normal breath sounds clear to auscultation       Cardiovascular Exercise Tolerance: Good + angina negative cardio ROS   Rhythm:regular Rate:Normal     Neuro/Psych Seizures -,  PSYCHIATRIC DISORDERS Depression Multiple allergies notedTIA Neuromuscular disease CVA    GI/Hepatic Neg liver ROS, hiatal hernia, GERD  ,  Endo/Other  negative endocrine ROS  Renal/GU negative Renal ROS  negative genitourinary   Musculoskeletal   Abdominal   Peds  Hematology negative hematology ROS (+)   Anesthesia Other Findings From Anes record 03/2017: Anesthesia Plan Comments: (Initially she claimed propofol made her depressed 2 days after her Nissen Revision surgery, but she had no problem after propofol sedation for sigmoidoscopy prior to that. It seems her issues are with other drugs given during the post op period and propofol was implicated incorrectly. Have discussed this extensively with the pt and she agrees to go ahead with propofol for the sedation.  )   08/21/18:  Discussed use of propofol today and patient agrees.   Reproductive/Obstetrics negative OB ROS                             Anesthesia Physical Anesthesia Plan  ASA: II  Anesthesia Plan: MAC   Post-op Pain Management:    Induction:   PONV Risk Score and Plan:   Airway Management Planned:    Additional Equipment:   Intra-op Plan:   Post-operative Plan:   Informed Consent: I have reviewed the patients History and Physical, chart, labs and discussed the procedure including the risks, benefits and alternatives for the proposed anesthesia with the patient or authorized representative who has indicated his/her understanding and acceptance.   Dental Advisory Given  Plan Discussed with: CRNA  Anesthesia Plan Comments:         Anesthesia Quick Evaluation

## 2018-08-21 NOTE — H&P (Signed)
Primary Care Physician:  Toma Deiters, MD Primary Gastroenterologist:  Dr. Darrick Penna  Pre-Procedure History & Physical: HPI:  Yesenia Boyd is a 62 y.o. female here for DYSPHAGIA.  Past Medical History:  Diagnosis Date  . Anginal pain (HCC)   . Asthma   . Bile reflux gastritis   . Complication of anesthesia    Deep depression following administration of propofol  . Depression    pt believes it's related to multiple sicknesses  . Family history of adverse reaction to anesthesia    sister required ventilatory support after tonsillectomy- told that " in the future need to be very careful with anesth."  . Fibromyalgia    sees Dr Philis Kendall at [facility]  . GERD (gastroesophageal reflux disease)   . History of hiatal hernia    2 surgeries, Lap. Nissen Fundiplication   . IBS (irritable bowel syndrome)   . Magnesium deficiency   . Myasthenia gravis    right sided weakness occ.  . Myasthenia gravis in remission (HCC)   . Neuropathy   . Numbness and tingling in right hand    and whole left side of body-pt sts she has "spells to where she cannot walk or talk"  . Peripheral neuropathy   . S/P colonoscopy April 2009, Sept 2012   normal Dr. Linna Darner, internal hemorrhoids, repeat in Sept 2022  . S/P endoscopy 2008, Aug 2010, Sept 2012   Dr. Linna Darner 2008: removal of impacted food bolus, Dr. Linna Darner 2010: moderate gastritis, Sept 2012 with SLF: path with mild gastritis, no definite stricture noted, dilation with Savary 16 mm  . Seizures (HCC)    Pt. reports -yes, pt. reports that she blanks out sometimes & the doctors have told her that's like a seizure    . Shortness of breath dyspnea    pt believes it's related to asthma  . Sleep apnea 2010   no cpap used, was told to get oxygen  to use, pt. lnows that she needs new evaluation   . Stroke [facility])    TIA 2010  . TIA (transient ischemic attack) 2012    Past Surgical History:  Procedure Laterality Date  . ABDOMINAL HYSTERECTOMY     complete per patient   . BLADDER SURGERY     stretch bladder opening  . CHOLECYSTECTOMY    . COLONOSCOPY  07/18/2011   SLF: 1. internal hemorrhoids  . COLONOSCOPY WITH PROPOFOL N/A 03/21/2017   Dr. Darrick Penna: Redundant left colon, hemorrhoids, next colonoscopy in 10 years.  Patient reports deep pression after propofol.  . ESOPHAGEAL MANOMETRY N/A 01/21/2013  . esophageal surgery?     2008 [facility]  . ESOPHAGOGASTRODUODENOSCOPY  07/18/2011   SLF: 1. Moderate gastritis 2. Dysphagia most likely 2o large food bolus moving through her Nissen, which appears to be intact. Pt has poor denttition and NL BPE 2 years ago. 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  Marland Kitchen EVALUATION UNDER ANESTHESIA WITH HEMORRHOIDECTOMY N/A 03/27/2015   Procedure: EXAM UNDER ANESTHESIA WITH SPHINCTEROTOMY ;  Surgeon: Luretha Murphy, MD;  Location: [facility]S;  Service: General;  Laterality: N/A;  . FLEXIBLE SIGMOIDOSCOPY N/A 12/09/2014   SLF: Rectal pain most likely due to internal and external hemorroids  . HAND SURGERY     carpal tumnnel right and removal of cyst left  . HEMORRHOID BANDING N/A 12/09/2014   Procedure: HEMORRHOID BANDING;  Surgeon: West Bali, MD;  Location: [facility];  Service: Endoscopy;  Laterality: N/A;  . LAPAROSCOPIC NISSEN  FUNDOPLICATION N/A 02/13/2013   Procedure: Redo LAPAROSCOPIC NISSEN FUNDOPLICATION;  Surgeon: Valarie Merino, MD;  Location: [facility];  Service: General;  Laterality: N/A;  Forgut explortation with partial  takedown nissan funliplication from 1994 for wrap torsion and persistant dysphagia  . NASAL SEPTOPLASTY W/ TURBINOPLASTY Bilateral 08/03/2016   Procedure: NASAL SEPTOPLASTY WITH TURBINATE REDUCTION;  Surgeon: Newman Pies, MD;  Location: [facility] OR;  Service: ENT;  Laterality: Bilateral;  . NECK SURGERY     removal of "knot" from neck  . NISSEN FUNDOPLICATION     initially in 1994. redo in 2014  . SAVORY DILATION  07/18/2011   Surgeon:Jakin Pavao M Barclay Lennox    Prior to Admission medications    Medication Sig Start Date End Date Taking? Authorizing Provider  acetaminophen (TYLENOL) 650 MG CR tablet Take 1,300 mg by mouth every 8 (eight) hours as needed for pain.    Yes [provider]  albuterol (PROVENTIL HFA;VENTOLIN HFA) 108 (90 Base) MCG/ACT inhaler Inhale 1-2 puffs into the lungs every 6 (six) hours as needed for wheezing or shortness of breath.    Yes [provider]  alum & mag hydroxide-simeth (MYLANTA) 200-200-20 MG/5ML suspension Take 10 mLs by mouth 2 (two) times daily.    Yes [provider]  azelastine (OPTIVAR) 0.05 % ophthalmic solution Place 2 drops into both eyes 2 (two) times daily as needed for allergies or itching. 01/16/17  Yes [provider]  beclomethasone (BECONASE-AQ) 42 MCG/SPRAY nasal spray Place 1 spray into both nostrils daily as needed.    Yes [provider]  CARAFATE 1 GM/10ML suspension Take 3 g by mouth 4 (four) times daily.  12/21/12  Yes [provider]  Cholecalciferol ([employer] VIT D 5000 HIGH-POTENCY) 5000 UNITS capsule Take 10,000 Units by mouth every morning.    Yes [provider]  Coenzyme Q10 (COQ10 PO) Take 10 mLs by mouth daily.    Yes [provider]  compounded topicals builder Apply 1 application topically 2 (two) times daily as needed (rash). Triamcinolone 0.1%, LCD 10%, SA 2% cream. Compounded by [facility].   Yes [provider]  Cyanocobalamin (B-12) 5000 MCG CAPS Take 10,000 mcg by mouth daily.   Yes [provider]  EPINEPHrine 0.3 mg/0.3 mL IJ SOAJ injection Inject 0.3 mg into the muscle as needed (allergic reaction).   Yes [provider]  esomeprazole (NEXIUM) 40 MG capsule Take 1 capsule (40 mg total) by mouth 2 (two) times daily before a meal. 04/13/18  Yes Tiffany Kocher, PA-C  estrogens, conjugated, (PREMARIN) 0.625 MG tablet Take 0.625 mg by mouth at bedtime.    Yes [provider]  fluticasone (CUTIVATE) 0.05 % cream Apply 1  application topically 2 (two) times daily.  08/13/17  Yes [provider]  Glucosamine-Chondroitin (MOVE FREE PO) Take 1 tablet by mouth daily.   Yes [provider]  hydrOXYzine (VISTARIL) 25 MG capsule Take 25 mg by mouth at bedtime.  12/19/12  Yes [provider]  L-Methylfolate-B6-B12 (FOLTANX) 3-35-2 MG TABS Take 1 tablet by mouth daily. 08/13/17  Yes [provider]  loratadine (CLARITIN) 10 MG tablet Take 10 mg by mouth daily.  02/16/17  Yes [provider]  lubiprostone ([name]) 24 MCG capsule Take 24 mcg by mouth 2 (two) times daily.    Yes [provider]  montelukast (SINGULAIR) 10 MG tablet Take 10 mg by mouth at bedtime.   Yes [provider]  nitroGLYCERIN (NITROSTAT) 0.4 MG SL tablet  Place 0.4 mg under the tongue every 5 (five) minutes as needed for chest pain.   Yes [provider]  Polyethyl Glycol-Propyl Glycol (SYSTANE OP) Place 2 drops into both eyes 2 (two) times daily.   Yes [provider]  ranitidine (ZANTAC) 300 MG tablet Take 300 mg by mouth at bedtime.   Yes [provider]  simethicone (MYLICON) 125 MG chewable tablet Chew 125 mg by mouth every 6 (six) hours as needed for flatulence.   Yes [provider]  tiZANidine (ZANAFLEX) 4 MG tablet Take 4 mg by mouth at bedtime.  12/07/12  Yes [provider]  triamcinolone (KENALOG) 0.1 % cream Apply 1 application topically 2 (two) times daily as needed (rash on scalp).    Yes [provider]  triamcinolone lotion (KENALOG) 0.1 % Apply 1 application topically daily as needed (facial rash).  07/25/18  Yes [provider]  vitamin E 1000 UNIT capsule Take 1,000 Units by mouth at bedtime.    Yes [provider]  magnesium citrate solution Take 148-296 mLs by mouth daily as needed (constipation).     [provider]    Allergies as of 08/07/2018 - Review Complete 08/07/2018  Allergen Reaction  Noted  . Aspirin Other (See Comments) 07/28/2016  . Avelox [moxifloxacin hcl in nacl] Anaphylaxis and Itching 06/20/2011  . Linzess [linaclotide] Swelling 02/04/2013  . Penicillins Anaphylaxis and Hives   . Adhesive [tape] Other (See Comments) 01/24/2013  . Carbamazepine Other (See Comments) 03/16/2015  . Cephalexin Hives and Itching   . Codeine Hives and Itching   . Cymbalta [duloxetine hcl] Other (See Comments) 06/20/2011  . Darvon [propoxyphene] Hives 03/17/2015  . Dicyclomine Itching and Other (See Comments) 06/20/2011  . Hydrocodone-acetaminophen Itching 03/15/2017  . Ketek [telithromycin]  09/06/2017  . Latex Itching 01/24/2013  . Macrolides and ketolides Swelling 07/18/2011  . Meperidine hcl Itching and Other (See Comments)   . Metronidazole Hives and Other (See Comments) 06/20/2011  . Morphine Itching and Other (See Comments)   . Pepcid [famotidine] Nausea And Vomiting 03/16/2015  . Prednisone Hives, Swelling, and Other (See Comments) 06/20/2011  . Propofol Other (See Comments) 03/17/2015  . Protonix [pantoprazole] Swelling 03/17/2015  . Sulfonamide derivatives Hives   . Tramadol hcl Hives and Other (See Comments)   . Zylet [loteprednol-tobramycin] Hives and Swelling 03/15/2017    Family History  Problem Relation Age of Onset  . Anesthesia problems Sister   . Cancer Sister        breast  . Hypertension Sister   . Parkinson's disease Mother   . Kidney disease Mother   . Diabetes Mother   . Heart attack Father   . Cancer Father        stomach cancer  . Hypertension Brother   . Diabetes Brother   . Arthritis Brother   . Kidney disease Brother   . Parkinson's disease Brother   . Cancer Maternal Aunt        breast  . Cancer Maternal Grandmother        breast  . Multiple sclerosis Daughter   . Colon cancer Cousin        4 cousins  . Pseudochol deficiency Neg Hx   . Hypotension Neg Hx   . Malignant hyperthermia Neg Hx     Social History   Socioeconomic  History  . Marital status: Married    Spouse name: Not on file  . Number of children: Not on file  . Years of  education: Not on file  . Highest education level: Not on file  Occupational History  . Not on file  Social Needs  . Financial resource strain: Not on file  . Food insecurity:    Worry: Not on file    Inability: Not on file  . Transportation needs:    Medical: Not on file    Non-medical: Not on file  Tobacco Use  . Smoking status: Never Smoker  . Smokeless tobacco: Never Used  Substance and Sexual Activity  . Alcohol use: Yes    Comment: drinks wine "every blue moon"  . Drug use: No  . Sexual activity: Yes    Birth control/protection: Surgical  Lifestyle  . Physical activity:    Days per week: Not on file    Minutes per session: Not on file  . Stress: Not on file  Relationships  . Social connections:    Talks on phone: Not on file    Gets together: Not on file    Attends religious service: Not on file    Active member of club or organization: Not on file    Attends meetings of clubs or organizations: Not on file    Relationship status: Not on file  . Intimate partner violence:    Fear of current or ex partner: Not on file    Emotionally abused: Not on file    Physically abused: Not on file    Forced sexual activity: Not on file  Other Topics Concern  . Not on file  Social History Narrative  . Not on file    Review of Systems: See HPI, otherwise negative ROS   Physical Exam: BP 137/82   Pulse 71   Temp 97.8 F (36.6 C) (Oral)   Resp 20   SpO2 100%  General:   Alert,  pleasant and cooperative in NAD Head:  Normocephalic and atraumatic. Neck:  Supple; Lungs:  Clear throughout to auscultation.    Heart:  Regular rate and rhythm. Abdomen:  Soft, nontender and nondistended. Normal bowel sounds, without guarding, and without rebound.   Neurologic:  Alert and  oriented x4;  grossly normal neurologically.  Impression/Plan:      DYSPHAGIA  PLAN:  EGD/DIL TODAY DISCUSSED PROCEDURE, BENEFITS, & RISKS: < 1% chance of medication reaction, bleeding, OR perforation.

## 2018-08-21 NOTE — Transfer of Care (Signed)
Immediate Anesthesia Transfer of Care Note  Patient: Yesenia Boyd  Procedure(s) Performed: ESOPHAGOGASTRODUODENOSCOPY (EGD) WITH PROPOFOL (N/A ) SAVORY DILATION  Patient Location: PACU  Anesthesia Type:MAC  Level of Consciousness: awake, alert  and oriented  Airway & Oxygen Therapy: Patient Spontanous Breathing  Post-op Assessment: Report given to RN  Post vital signs: Reviewed and stable  Last Vitals:  Vitals Value Taken Time  BP    Temp    Pulse 70 08/21/2018  8:49 AM  Resp 17 08/21/2018  8:49 AM  SpO2 97 % 08/21/2018  8:49 AM  Vitals shown include unvalidated device data.  Last Pain:  Vitals:   08/21/18 0839  TempSrc:   PainSc: 0-No pain         Complications: No apparent anesthesia complications

## 2018-08-21 NOTE — Anesthesia Postprocedure Evaluation (Signed)
Anesthesia Post Note  Patient: Yesenia Boyd  Procedure(s) Performed: ESOPHAGOGASTRODUODENOSCOPY (EGD) WITH PROPOFOL (N/A ) SAVORY DILATION  Patient location during evaluation: PACU Anesthesia Type: MAC Level of consciousness: awake and alert and oriented Pain management: pain level controlled Vital Signs Assessment: post-procedure vital signs reviewed and stable Respiratory status: spontaneous breathing Cardiovascular status: blood pressure returned to baseline Postop Assessment: no apparent nausea or vomiting and adequate PO intake Anesthetic complications: no     Last Vitals:  Vitals:   08/21/18 0732  BP: 137/82  Pulse: 71  Resp: 20  Temp: 36.6 C  SpO2: 100%    Last Pain:  Vitals:   08/21/18 0839  TempSrc:   PainSc: 0-No pain                 Lakaisha Danish

## 2018-08-27 ENCOUNTER — Encounter (HOSPITAL_COMMUNITY): Payer: Self-pay | Admitting: Gastroenterology

## 2018-09-05 ENCOUNTER — Inpatient Hospital Stay (HOSPITAL_COMMUNITY): Admission: RE | Admit: 2018-09-05 | Payer: Self-pay | Source: Ambulatory Visit

## 2018-09-05 ENCOUNTER — Encounter: Payer: Self-pay | Admitting: Vascular Surgery

## 2018-09-06 ENCOUNTER — Telehealth: Payer: Self-pay

## 2018-09-06 NOTE — Telephone Encounter (Signed)
LMOM for a return call.  

## 2018-09-06 NOTE — Telephone Encounter (Signed)
Pt said she had an EGD with Dr. Darrick Penna on 08/21/2018.  She said she has been extremely sore at the top of her stomach since that procedure.  She has been experiencing cramps in her lower abdomen.  Had blood in stool and on tissue x 1, but has hx of hemorrhoids.  She said she has Bm's daily if she takes her [name].  She takes 2 capsules of the [name] 24 mcg every morning when she first gets up.  I told her she is supposed to take one capsule twice daily and she said she has been through that with Dr. Darrick Penna, and she just cannot take it like that.   She usually has one good BM after she takes the [name] and sometimes a second BM, and she said that is all she has for the day.   Verlon Au, please advise in Dr. Darrick Penna' absence.

## 2018-09-06 NOTE — Telephone Encounter (Signed)
Reviewed EGD report. No biopsies taken. Esophagus dilated. Stomach has some food in it.   She has numerous drug allergies. No plans to change her meds today.  Continue nexium bid.  Continue nighttime ranitidine 3 make sure she is taking Mylanta and Carafate.  The Carafate I would not take more than 1 g 4 times a day  Recommend splitting her [name] 24 mcg twice daily rather than both pills at one time..  See if we can bring her into the office in the next 1 week if there is a cancellation. Save for further input from Dr. Darrick Penna.

## 2018-09-07 ENCOUNTER — Telehealth: Payer: Self-pay | Admitting: Gastroenterology

## 2018-09-07 NOTE — Telephone Encounter (Signed)
Discussed LSL recommendations. Pt states that she is going to take the medication the way she has been taking it. Pt is also aware that she will be placed on a cancellation list. She also said she gets some stomach pain when bending over for long periods of time.   Please put pt on a cancellation for next week.

## 2018-09-07 NOTE — Telephone Encounter (Signed)
[phone] PATIENT RECEIVED CALL THAT WE WERE TRYING TO REACH HER, PLEASE CALL

## 2018-09-07 NOTE — Telephone Encounter (Signed)
See documentation in other phone note.  

## 2018-09-10 NOTE — Telephone Encounter (Signed)
Noted  

## 2018-09-10 NOTE — Telephone Encounter (Signed)
Added to wait list.

## 2018-11-21 ENCOUNTER — Encounter (HOSPITAL_COMMUNITY): Payer: Self-pay

## 2018-11-21 ENCOUNTER — Encounter: Payer: Self-pay | Admitting: Vascular Surgery

## 2018-12-06 DIAGNOSIS — Z6826 Body mass index (BMI) 26.0-26.9, adult: Secondary | ICD-10-CM | POA: Diagnosis not present

## 2018-12-06 DIAGNOSIS — G43919 Migraine, unspecified, intractable, without status migrainosus: Secondary | ICD-10-CM | POA: Diagnosis not present

## 2018-12-06 DIAGNOSIS — R55 Syncope and collapse: Secondary | ICD-10-CM | POA: Diagnosis not present

## 2018-12-13 DIAGNOSIS — Z1231 Encounter for screening mammogram for malignant neoplasm of breast: Secondary | ICD-10-CM | POA: Diagnosis not present

## 2018-12-17 ENCOUNTER — Encounter

## 2018-12-17 ENCOUNTER — Encounter: Payer: Self-pay | Admitting: *Deleted

## 2018-12-17 ENCOUNTER — Encounter: Payer: Self-pay | Admitting: Gastroenterology

## 2018-12-17 ENCOUNTER — Ambulatory Visit (INDEPENDENT_AMBULATORY_CARE_PROVIDER_SITE_OTHER): Payer: BLUE CROSS/BLUE SHIELD | Admitting: Gastroenterology

## 2018-12-17 VITALS — BP 130/75 | HR 90 | Temp 96.9°F | Ht 63.0 in | Wt 160.8 lb

## 2018-12-17 DIAGNOSIS — K581 Irritable bowel syndrome with constipation: Secondary | ICD-10-CM

## 2018-12-17 DIAGNOSIS — K219 Gastro-esophageal reflux disease without esophagitis: Secondary | ICD-10-CM

## 2018-12-17 DIAGNOSIS — K625 Hemorrhage of anus and rectum: Secondary | ICD-10-CM

## 2018-12-17 DIAGNOSIS — R1319 Other dysphagia: Secondary | ICD-10-CM

## 2018-12-17 DIAGNOSIS — R1310 Dysphagia, unspecified: Secondary | ICD-10-CM
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
## 2018-12-17 NOTE — Patient Instructions (Signed)
1. Use hemorrhoid cream twice daily for 5-7 days.  2. Xray of your esophagus.  3. We will be in touch in couple of days with further recommendations after I research your concerns.

## 2018-12-19 ENCOUNTER — Encounter: Payer: Self-pay | Admitting: Gastroenterology

## 2018-12-19 ENCOUNTER — Telehealth: Payer: Self-pay | Admitting: Gastroenterology

## 2018-12-19 NOTE — Assessment & Plan Note (Signed)
Ongoing dysphagia with no benefit with recent EGD/ED. Reports remote Nissen fundoplication. She continues to have multiple complaints regarding regurgitation especially in relations to urge for BMs, see above. Plan for BPE. Further recommendations to follow.
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
## 2018-12-19 NOTE — Telephone Encounter (Signed)
PT is aware of all info and recommendations.

## 2018-12-19 NOTE — Telephone Encounter (Signed)
Please let patient know that I did look into her concerns that she may have chronic atrial and intestinal dysrhythmia.  This is a rare genetic disorder usually patients develop heart and digestive issues by the age of 8 at which time they were diagnosed.  Patient is develop heart rhythm abnormality called sick sinus syndrome.  I would encourage her to pursue echocardiogram as ordered by her PCP.  Although I feel that she does not have this disorder, I would encourage her to have a cardiology consultation due to her reports of palpitations and syncope.  I would not recommend driving until this can be sorted out.

## 2018-12-19 NOTE — Assessment & Plan Note (Signed)
Likely due to hemorrhoids. Recent colonoscopy 03/2017 reassuring. Monitor for persistent symptoms. She will utilize the hemorrhoid cream she has at home bid for at least 7 days.

## 2018-12-19 NOTE — Progress Notes (Signed)
CC'D TO PCP °

## 2018-12-21 DIAGNOSIS — R55 Syncope and collapse: Secondary | ICD-10-CM | POA: Diagnosis not present

## 2018-12-24 ENCOUNTER — Ambulatory Visit (HOSPITAL_COMMUNITY)
Admission: RE | Admit: 2018-12-24 | Discharge: 2018-12-24 | Disposition: A | Discharge status: Home / Self Care | Payer: BLUE CROSS/BLUE SHIELD | Source: Ambulatory Visit | Attending: Gastroenterology | Admitting: Gastroenterology

## 2018-12-24 DIAGNOSIS — R1310 Dysphagia, unspecified: Secondary | ICD-10-CM | POA: Diagnosis not present

## 2018-12-24 DIAGNOSIS — K219 Gastro-esophageal reflux disease without esophagitis: Secondary | ICD-10-CM | POA: Diagnosis not present

## 2018-12-24 DIAGNOSIS — R1314 Dysphagia, pharyngoesophageal phase: Secondary | ICD-10-CM | POA: Diagnosis not present

## 2018-12-24 DIAGNOSIS — R1319 Other dysphagia: Secondary | ICD-10-CM

## 2018-12-24 DIAGNOSIS — K625 Hemorrhage of anus and rectum: Secondary | ICD-10-CM | POA: Diagnosis not present

## 2018-12-24 DIAGNOSIS — K581 Irritable bowel syndrome with constipation: Secondary | ICD-10-CM | POA: Diagnosis not present

## 2018-12-26 NOTE — Progress Notes (Signed)
LMOM to call.

## 2018-12-27 ENCOUNTER — Other Ambulatory Visit: Payer: Self-pay | Admitting: *Deleted

## 2018-12-27 DIAGNOSIS — R55 Syncope and collapse: Secondary | ICD-10-CM

## 2018-12-27 NOTE — Progress Notes (Signed)
PT is aware of results and plan. Ok to refer to Cardiology in [place]. She would like an appt with Dr. Darrick Penna to discuss before being referred somewhere.

## 2019-01-23 ENCOUNTER — Ambulatory Visit: Payer: Self-pay | Admitting: Cardiovascular Disease

## 2019-03-04 DIAGNOSIS — K210 Gastro-esophageal reflux disease with esophagitis: Secondary | ICD-10-CM | POA: Diagnosis not present

## 2019-03-04 DIAGNOSIS — F459 Somatoform disorder, unspecified: Secondary | ICD-10-CM | POA: Diagnosis not present

## 2019-03-04 DIAGNOSIS — R591 Generalized enlarged lymph nodes: Secondary | ICD-10-CM | POA: Diagnosis not present

## 2019-03-04 DIAGNOSIS — Z6826 Body mass index (BMI) 26.0-26.9, adult: Secondary | ICD-10-CM | POA: Diagnosis not present

## 2019-03-06 ENCOUNTER — Ambulatory Visit: Payer: Self-pay | Admitting: Cardiovascular Disease

## 2019-03-12 ENCOUNTER — Encounter: Payer: Self-pay | Admitting: Gastroenterology

## 2019-04-03 DIAGNOSIS — K210 Gastro-esophageal reflux disease with esophagitis: Secondary | ICD-10-CM | POA: Diagnosis not present

## 2019-04-03 DIAGNOSIS — F459 Somatoform disorder, unspecified: Secondary | ICD-10-CM | POA: Diagnosis not present

## 2019-04-03 DIAGNOSIS — Z6826 Body mass index (BMI) 26.0-26.9, adult: Secondary | ICD-10-CM | POA: Diagnosis not present

## 2019-04-11 ENCOUNTER — Other Ambulatory Visit: Payer: Self-pay

## 2019-04-11 ENCOUNTER — Encounter: Payer: Self-pay | Admitting: Gastroenterology

## 2019-04-11 ENCOUNTER — Ambulatory Visit (INDEPENDENT_AMBULATORY_CARE_PROVIDER_SITE_OTHER): Payer: BC Managed Care – PPO | Admitting: Gastroenterology

## 2019-04-11 DIAGNOSIS — R1319 Other dysphagia: Secondary | ICD-10-CM

## 2019-04-11 DIAGNOSIS — Z1159 Encounter for screening for other viral diseases: Secondary | ICD-10-CM | POA: Diagnosis not present

## 2019-04-11 DIAGNOSIS — K581 Irritable bowel syndrome with constipation: Secondary | ICD-10-CM | POA: Diagnosis not present

## 2019-04-11 DIAGNOSIS — R1310 Dysphagia, unspecified: Secondary | ICD-10-CM | POA: Diagnosis not present

## 2019-04-11 NOTE — Assessment & Plan Note (Signed)
CHECK HEPATITIS C Ab IN SEP 2020.

## 2019-04-11 NOTE — Assessment & Plan Note (Signed)
SYMPTOMS FAIRLY WELL CONTROLLED.  CONTINUE TO MONITOR SYMPTOMS. 

## 2019-04-11 NOTE — Progress Notes (Signed)
CC'D TO PCP °

## 2019-04-11 NOTE — Progress Notes (Signed)
Subjective:    Patient ID: Yesenia Boyd, female    DOB: 16-May-1956, 63 y.o.   MRN: [id number]  Toma Deiters, MD  HPI BLOATING/GAS, HEMORRHOIDS, FOOD GETTING STUCK/NAUSEA. SON BACK AT HOME AND SOMETIMES HE GOES AWAY AND COMES BACK. BLOATING & GASSY: ALL THE TIME AND RUMBLING: NOISY BUT NOT CRAMPING. OCCASIONAL SPASMS. PROBLEMS WITH FOOD EVERY TIME SHE EATS. DOES GOOD IF EATS LESS LIQUIDS. NEW TEETH AND FOLLOWING A SOFT DIET. BREAKFAST: NOTHING. DINNER LAST NIGHT: CHICKEN WINGS. HAD SURGERY IN [place]. USE PREPARATION H FOUR TIMES  A DAY IF NEEDED TO RELIEVE RECTAL PAIN/PRESSURE/BLEEDING. PEPPERMINT ROOT TO HELP WITH NAUSEA. HEARTBURN(EVERY NIGHT): BURNING AND SLIMY STUFF COMING UP AND FOOD SOMETIMES. DOESN'T SLEEP ON A WEDGE BECAUSE IT MAKES HER NECK HURT. SLEEPS ON TWO PILLOWS. BMs: DOING HALF WAY DECENT AND NOT TAKING MEDS. IF HAS A BM LESS ABDOMINAL PAIN(2-3X/DAY, SOFT). DIARRHEA: NOT THAT OFTEN.  PT DENIES FEVER, CHILLS, HEMATOCHEZIA, HEMATEMESIS, vomiting, melena, diarrhea, CHEST PAIN, SHORTNESS OF BREATH, CHANGE IN BOWEL IN HABITS, OR constipation.  Past Medical History:  Diagnosis Date  . Anginal pain (HCC)   . Asthma   . Bile reflux gastritis   . Complication of anesthesia    Deep depression following administration of propofol  . Depression    pt believes it's related to multiple sicknesses  . Family history of adverse reaction to anesthesia    sister required ventilatory support after tonsillectomy- told that " in the future need to be very careful with anesth."  . Fibromyalgia    [facility]is [facility]  . GERD (gastroesophageal reflux disease)   . History of hiatal hernia    2 surgeries, Lap. Nissen Fundiplication   . IBS (irritable bowel syndrome)   . Magnesium deficiency   . Myasthenia gravis    right sided weakness occ.  . Myasthenia gravis in remission (HCC)   . Neuropathy   . Numbness and tingling in right hand    and whole left side of body-pt sts she has "spells to  where she cannot walk or talk"  . Peripheral neuropathy   . S/P colonoscopy April 2009, Sept 2012   normal Dr. Linna Darner, internal hemorrhoids, repeat in Sept 2022  . S/P endoscopy 2008, Aug 2010, Sept 2012   Dr. Linna Darner 2008: removal of impacted food bolus, Dr. Linna Darner 2010: moderate gastritis, Sept 2012 with SLF: path with mild gastritis, no definite stricture noted, dilation with Savary 16 mm  . Seizures (HCC)    Pt. reports -yes, pt. reports that she blanks out sometimes & the doctors have told her that's like a seizure    . Shortness of breath dyspnea    pt believes it's related to asthma  . Sleep apnea 2010   no cpap used, was told to get oxygen  to use, pt. lnows that she needs new evaluation   . Stroke [facility])    TIA 2010  . TIA (transient ischemic attack) 2012    Past Surgical History:  Procedure Laterality Date  . ABDOMINAL HYSTERECTOMY     complete per patient  . BLADDER SURGERY     stretch bladder opening  . CHOLECYSTECTOMY    . COLONOSCOPY  07/18/2011   SLF: 1. internal hemorrhoids  . COLONOSCOPY WITH PROPOFOL N/A 03/21/2017   Dr. Darrick Penna: Redundant left colon, hemorrhoids, next colonoscopy in 10 years.  Patient reports deep pression after propofol.  . ESOPHAGEAL MANOMETRY N/A 01/21/2013  . esophageal surgery?     2008 [facility]  .  ESOPHAGOGASTRODUODENOSCOPY  07/18/2011   SLF: 1. Moderate gastritis 2. Dysphagia most likely 2o large food bolus moving through her Nissen, which appears to be intact. Pt has poor denttition and NL BPE 2 years ago. 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  . ESOPHAGOGASTRODUODENOSCOPY (EGD) WITH PROPOFOL N/A 08/21/2018   dr. Darrick Penna: mild gastritis, esophagus appeared normal. s/p dilation  . EVALUATION UNDER ANESTHESIA WITH HEMORRHOIDECTOMY N/A 03/27/2015   Procedure: EXAM UNDER ANESTHESIA WITH SPHINCTEROTOMY ;  Surgeon: Luretha Murphy, MD;  Location: [facility];  Service: General;  Laterality: N/A;  . FLEXIBLE SIGMOIDOSCOPY  N/A 12/09/2014   SLF: Rectal pain most likely due to internal and external hemorroids  . HAND SURGERY     carpal tumnnel right and removal of cyst left  . HEMORRHOID BANDING N/A 12/09/2014   Procedure: HEMORRHOID BANDING;  Surgeon: West Bali, MD;  Location: [facility];  Service: Endoscopy;  Laterality: N/A;  . LAPAROSCOPIC NISSEN FUNDOPLICATION N/A 02/13/2013   Procedure: Redo LAPAROSCOPIC NISSEN FUNDOPLICATION;  Surgeon: Valarie Merino, MD;  Location: [facility];  Service: General;  Laterality: N/A;  Forgut explortation with partial  takedown nissan funliplication from 1994 for wrap torsion and persistant dysphagia  . NASAL SEPTOPLASTY W/ TURBINOPLASTY Bilateral 08/03/2016   Procedure: NASAL SEPTOPLASTY WITH TURBINATE REDUCTION;  Surgeon: Newman Pies, MD;  Location: [facility];  Service: ENT;  Laterality: Bilateral;  . NECK SURGERY     removal of "knot" from neck  . NISSEN FUNDOPLICATION     initially in 1994. redo in 2014  . SAVORY DILATION  07/18/2011   Surgeon:Sandi M Fields  . SAVORY DILATION  08/21/2018   Procedure: SAVORY DILATION;  Surgeon: West Bali, MD;  Location: [facility] SUITE;  Service: Endoscopy;;   Allergies  Allergen Reactions  . Aspirin Other (See Comments)    Stomach distress, rash  . Avelox [Moxifloxacin Hcl In Nacl] Anaphylaxis and Itching  . Linzess [Linaclotide] Swelling    Swelling, bloating, nausea, BLISTERS IN HER MOUTH  . Penicillins Anaphylaxis and Hives      . Adhesive [Tape] Other (See Comments)    Red and itchy under tape  . Carbamazepine Other (See Comments)    Memory loss   . Cephalexin Hives and Itching  . Clindamycin/Lincomycin Hives and Itching    blisters  . Codeine Hives and Itching  . Cymbalta [Duloxetine Hcl] Other (See Comments)    Hair loss, nervousness, severe constipation, red blotches  . Darvon [Propoxyphene] Hives  . Dicyclomine Itching and Other (See Comments)    Nervousness  . Hydrocodone-Acetaminophen Itching  . Ketek [Telithromycin]   .  Latex Itching  . Macrolides And Ketolides Swelling  . Meperidine Hcl Itching and Other (See Comments)    Nervousness  . Metronidazole Hives and Other (See Comments)    Red blotches.  . Morphine Itching and Other (See Comments)    Nervousness  . Mupirocin Itching    Made blisters worse  . Pepcid [Famotidine] Nausea And Vomiting  . Prednisone Hives, Swelling and Other (See Comments)    Red blotches - Can take with Benadryl  . Propofol Other (See Comments)    Depression  Tolerated propofol October 2019  . Protonix [Pantoprazole] Swelling  . Sulfonamide Derivatives Hives  . Tramadol Hcl Hives and Other (See Comments)    Nervousness  . Zylet [Loteprednol-Tobramycin] Hives and Swelling   Current Outpatient Medications  Medication Sig    . acetaminophen (TYLENOL) 650 MG CR tablet Take 1,300  mg by mouth every 8 (eight) hours as needed for pain.     Marland Kitchen. albuterol (PROVENTIL HFA;VENTOLIN HFA) 108 (90 Base) MCG/ACT inhaler Inhale 1-2 puffs into the lungs every 6 (six) hours as needed for wheezing or shortness of breath.     Marland Kitchen. alum & mag hydroxide-simeth (MYLANTA) 200-200-20 MG/5ML suspension Take 10 mLs by mouth 2 (two) times daily.     Marland Kitchen. CARAFATE 1 GM/10ML suspension Take 3 g by mouth as needed (heartburn, reflux).     . Cholecalciferol ([employer]S VIT D 5000 HIGH-POTENCY) 5000 UNITS capsule Take 10,000 Units by mouth every morning.     . Coenzyme Q10 (COQ10 PO) Take 10 mLs by mouth daily.     Marland Kitchen. EPINEPHrine 0.3 mg/0.3 mL IJ SOAJ injection Inject 0.3 mg into the muscle as needed (allergic reaction).    Marland Kitchen. esomeprazole (NEXIUM) 40 MG capsule Take 1 capsule (40 mg total) by mouth 2 (two) times daily before a meal.    . estrogens, conjugated, (PREMARIN) 0.625 MG tablet Take 0.625 mg by mouth at bedtime.     . Glucosamine-Chondroitin (MOVE FREE PO) Take 1 tablet by mouth daily.    . hydrOXYzine (VISTARIL) 25 MG capsule Take 25 mg by mouth at bedtime.     Marland Kitchen. L-Methylfolate-B6-B12 (FOLTANX) 3-35-2 MG TABS  Take 1 tablet by mouth daily.    . montelukast (SINGULAIR) 10 MG tablet Take 10 mg by mouth at bedtime.    . nitroGLYCERIN (NITROSTAT) 0.4 MG SL tablet Place 0.4 mg under the tongue every 5 (five) minutes as needed for chest pain.    . rizatriptan (MAXALT) 10 MG tablet Take 10 mg by mouth as needed for migraine.    . simethicone (MYLICON) 125 MG chewable tablet Chew 125 mg by mouth every 6 (six) hours as needed for flatulence.    Marland Kitchen. tiZANidine (ZANAFLEX) 4 MG tablet Take 6 mg by mouth at bedtime.     . vitamin E 1000 UNIT capsule Take 1,000 Units by mouth at bedtime.     . compounded topicals builder Apply 1 application topically 2 (two) times daily as needed (rash). Triamcinolone 0.1%, LCD 10%, SA 2% cream. Compounded by [facility].    . hydrOXYzine (VISTARIL) 25 MG/ML injection Inject into the muscle at bedtime.    Marland Kitchen. ketoconazole (NIZORAL) 2 % cream Apply 1 application topically 2 (two) times daily.    Marland Kitchen. loratadine (CLARITIN) 10 MG tablet Take 10 mg by mouth daily.      Review of Systems PER HPI OTHERWISE ALL SYSTEMS ARE NEGATIVE.    Objective:   Physical Exam Vitals signs reviewed.  Constitutional:      General: She is not in acute distress.    Appearance: She is well-developed.  HENT:     Head: Normocephalic and atraumatic.     Mouth/Throat:     Pharynx: No oropharyngeal exudate.  Eyes:     General: No scleral icterus.    Pupils: Pupils are equal, round, and reactive to light.  Neck:     Musculoskeletal: Normal range of motion and neck supple.  Cardiovascular:     Rate and Rhythm: Normal rate and regular rhythm.     Heart sounds: Normal heart sounds.  Pulmonary:     Effort: Pulmonary effort is normal. No respiratory distress.     Breath sounds: Normal breath sounds.  Abdominal:     General: Bowel sounds are normal. There is no distension.     Palpations: Abdomen is soft.  Tenderness: There is abdominal tenderness. There is no guarding or rebound.     Comments: MILD TTP IN  THE EPIGASTRIUM   Musculoskeletal:     Right lower leg: No edema.     Left lower leg: No edema.  Lymphadenopathy:     Cervical: No cervical adenopathy.  Neurological:     Mental Status: She is alert and oriented to person, place, and time.     Comments: NO  NEW FOCAL DEFICITS  Psychiatric:     Comments: FLAT AFFECT, NL MOOD       Assessment & Plan:

## 2019-04-11 NOTE — Patient Instructions (Addendum)
DRINK WATER TO KEEP YOUR URINE LIGHT YELLOW.  FOLLOW A SOFT MECHANICAL DIET.  MEATS SHOULD BE GROUND ONLY. SEE INFO BELOW.   CONTINUE NEXIUM. TAKE 30 MINUTES BEFORE MEALS TWICE DAILY.  CHECK HEPATITIS C TEST IN SEP 2020.  FOLLOW UP IN 6 MOS.   SOFT MECHANICAL DIET This SOFT MECHANICAL DIET is restricted to:  Foods that are moist, soft-textured, and easy to chew and swallow.   Meats that are ground or are minced no larger than one-quarter inch pieces. Meats are moist with gravy or sauce added.   Foods that do not include bread or bread-like textures except soft pancakes, well-moistened with syrup or sauce.   Textures with some chewing ability required.   Casseroles without rice.   Cooked vegetables that are less than half an inch in size and easily mashed with a fork. No cooked corn, peas, broccoli, cauliflower, cabbage, Brussels sprouts, asparagus, or other fibrous, non-tender or rubbery cooked vegetables.   Canned fruit except for pineapple. Fruit must be cut into pieces no larger than half an inch in size.   Foods that do not include nuts, seeds, coconut, or sticky textures.   FOOD TEXTURES FOR DYSPHAGIA DIET LEVEL 2 -SOFT MECHANICAL DIET (includes all foods on Dysphagia Diet Level 1 - Pureed, in addition to the foods listed below)  FOOD GROUP: Breads. RECOMMENDED: Soft pancakes, well-moistened with syrup or sauce.  AVOID: All others.  FOOD GROUP: Cereals.  RECOMMENDED: Cooked cereals with little texture, including oatmeal. Unprocessed wheat bran stirred into cereals for bulk. Note: If thin liquids are restricted, it is important that all of the liquid is absorbed into the cereal.  AVOID: All dry cereals and any cooked cereals that may contain flax seeds or other seeds or nuts. Whole-grain, dry, or coarse cereals. Cereals with nuts, seeds, dried fruit, and/or coconut.  FOOD GROUP: Desserts. RECOMMENDED: Pudding, custard. Soft fruit pies with bottom crust only. Canned  fruit (excluding pineapple). Soft, moist cakes with icing.Frozen malts, milk shakes, frozen yogurt, eggnog, nutritional supplements, ice cream, sherbet, regular or sugar-free gelatin, or any foods that become thin liquid at either room (70 F) or body temperature (98 F).  AVOID: Dry, coarse cakes and cookies. Anything with nuts, seeds, coconut, pineapple, or dried fruit. Breakfast yogurt with nuts. Rice or bread pudding.  FOOD GROUP: Fats. RECOMMENDED: Butter, margarine, cream for cereal (depending on liquid consistency recommendations), gravy, cream sauces, sour cream, sour cream dips with soft additives, mayonnaise, salad dressings, cream cheese, cream cheese spreads with soft additives, whipped toppings.  AVOID: All fats with coarse or chunky additives.  FOOD GROUP: Fruits. RECOMMENDED: Soft drained, canned, or cooked fruits without seeds or skin. Fresh soft and ripe banana. Fruit juices with a small amount of pulp. If thin liquids are restricted, fruit juices should be thickened to appropriate consistency.  AVOID: Fresh or frozen fruits. Cooked fruit with skin or seeds. Dried fruits. Fresh, canned, or cooked pineapple.  FOOD GROUP: Meats and Meat Substitutes. (Meat pieces should not exceed 1/4 of an inch cube and should be tender.) RECOMMENDED: Moistened ground or cooked meat, poultry, or fish. Moist ground or tender meat may be served with gravy or sauce. Casseroles without rice. Moist macaroni and cheese, well-cooked pasta with meat sauce, tuna noodle casserole, soft, moist lasagna. Moist meatballs, meatloaf, or fish loaf. Protein salads, such as tuna or egg without large chunks, celery, or onion. Cottage cheese, smooth quiche without large chunks. Poached, scrambled, or soft-cooked eggs (egg yolks should not be "runny"  but should be moist and able to be mashed with butter, margarine, or other moisture added to them). (Cook eggs to 160 F or use pasteurized eggs for safety.) Souffls may  have small, soft chunks. Tofu. Well-cooked, slightly mashed, moist legumes, such as baked beans. All meats or protein substitutes should be served with sauces or moistened to help maintain cohesiveness in the oral cavity.  AVOID: Dry meats, tough meats (such as bacon, sausage, hot dogs, bratwurst). Dry casseroles or casseroles with rice or large chunks. Peanut butter. Cheese slices and cubes. Hard-cooked or crisp fried eggs. Sandwiches.Pizza.  FOOD GROUP: Potatoes and Starches. RECOMMENDED: Well-cooked, moistened, boiled, baked, or mashed potatoes. Well-cooked shredded hash brown potatoes that are not crisp. (All potatoes need to be moist and in sauces.)Well-cooked noodles in sauce. Spaetzel or soft dumplings that have been moistened with butter or gravy.  AVOID: Potato skins and chips. Fried or French-fried potatoes. Rice.  FOOD GROUP: Soups. RECOMMENDED: Soups with easy-to-chew or easy-to-swallow meats or vegetables: Particle sizes in soups should be less than 1/2 inch. Soups will need to be thickened to appropriate consistency if soup is thinner than prescribed liquid consistency.  AVOID: Soups with large chunks of meat and vegetables. Soups with rice, corn, peas.  FOOD GROUP: Vegetables. RECOMMENDED: All soft, well-cooked vegetables. Vegetables should be less than a half inch. Should be easily mashed with a fork.  AVOID: Cooked corn and peas. Broccoli, cabbage, Brussels sprouts, asparagus, or other fibrous, non-tender or rubbery cooked vegetables.  FOOD GROUP: Miscellaneous. RECOMMENDED: Jams and preserves without seeds, jelly. Sauces, salsas, etc., that may have small tender chunks less than 1/2 inch. Soft, smooth chocolate bars that are easily chewed.  AVOID: Seeds, nuts, coconut, or sticky foods. Chewy candies such as caramels or licorice.

## 2019-04-11 NOTE — Assessment & Plan Note (Signed)
DUE TO PRIOR NISSEN FUNDOPLICATION. SYMPTOMS FAIRLY WELL CONTROLLED WITH DIET MODIFICATION. NOT A CANDIDATE FOR SURGICAL REVISIONS.  EXPLAINED LONG TERM MANAGEMENT FOR DYSPHAGIA INCLUDE DIET MODIFICATION. DRINK WATER TO KEEP YOUR URINE LIGHT YELLOW. FOLLOW A SOFT MECHANICAL DIET.  MEATS SHOULD BE GROUND ONLY.  HANDOUT GIVEN. FOLLOW UP IN 6 MOS.

## 2019-04-12 NOTE — Progress Notes (Signed)
ON RECALL  °

## 2019-04-15 ENCOUNTER — Ambulatory Visit: Payer: BLUE CROSS/BLUE SHIELD | Admitting: Cardiovascular Disease

## 2019-05-02 ENCOUNTER — Other Ambulatory Visit: Payer: Self-pay | Admitting: Gastroenterology

## 2019-05-16 ENCOUNTER — Ambulatory Visit: Payer: BC Managed Care – PPO | Admitting: Cardiology

## 2019-06-25 DIAGNOSIS — Z23 Encounter for immunization: Secondary | ICD-10-CM | POA: Diagnosis not present

## 2019-07-11 ENCOUNTER — Ambulatory Visit: Payer: Medicare Other | Admitting: Cardiology

## 2019-07-31 ENCOUNTER — Other Ambulatory Visit: Payer: Self-pay

## 2019-07-31 DIAGNOSIS — Z1159 Encounter for screening for other viral diseases: Secondary | ICD-10-CM

## 2019-08-06 ENCOUNTER — Telehealth: Payer: Self-pay

## 2019-08-06 NOTE — Telephone Encounter (Signed)
Pt received order to do the Hep C antibody. She left me a VM that Dr. Sherrie Sport has done that test. I called her and got Vm and told her we will get the results from him. Forwarding to Manuela Schwartz to get the result.

## 2019-08-06 NOTE — Telephone Encounter (Signed)
REVIEWED. AGREE. NO ADDITIONAL RECOMMENDATIONS. 

## 2019-08-07 DIAGNOSIS — H2513 Age-related nuclear cataract, bilateral: Secondary | ICD-10-CM | POA: Diagnosis not present

## 2019-08-07 NOTE — Telephone Encounter (Signed)
Requested labs from PCP 

## 2019-08-22 ENCOUNTER — Ambulatory Visit: Payer: Medicare Other | Admitting: Cardiology

## 2019-09-16 ENCOUNTER — Encounter: Payer: Self-pay | Admitting: Gastroenterology

## 2019-10-07 ENCOUNTER — Ambulatory Visit: Payer: Medicare Other | Admitting: Cardiovascular Disease

## 2019-10-22 ENCOUNTER — Telehealth: Payer: Self-pay | Admitting: Gastroenterology

## 2019-10-22 NOTE — Telephone Encounter (Signed)
Patient called and said that her prescription needs a prior auth before she can get it filled, it is nexium.

## 2019-10-23 ENCOUNTER — Ambulatory Visit: Payer: Medicare Other | Admitting: Gastroenterology

## 2019-10-25 ENCOUNTER — Telehealth: Payer: Self-pay

## 2019-10-28 NOTE — Telephone Encounter (Signed)
Opened in error

## 2019-10-29 DIAGNOSIS — G6289 Other specified polyneuropathies: Secondary | ICD-10-CM | POA: Diagnosis not present

## 2019-10-29 DIAGNOSIS — F458 Other somatoform disorders: Secondary | ICD-10-CM | POA: Diagnosis not present

## 2019-10-29 DIAGNOSIS — J398 Other specified diseases of upper respiratory tract: Secondary | ICD-10-CM | POA: Diagnosis not present

## 2019-10-29 DIAGNOSIS — K219 Gastro-esophageal reflux disease without esophagitis: Secondary | ICD-10-CM | POA: Diagnosis not present

## 2019-10-29 DIAGNOSIS — M7918 Myalgia, other site: Secondary | ICD-10-CM | POA: Diagnosis not present

## 2019-10-29 DIAGNOSIS — Z6826 Body mass index (BMI) 26.0-26.9, adult: Secondary | ICD-10-CM | POA: Diagnosis not present

## 2019-11-04 DIAGNOSIS — E2839 Other primary ovarian failure: Secondary | ICD-10-CM | POA: Diagnosis not present

## 2019-11-04 DIAGNOSIS — M810 Age-related osteoporosis without current pathological fracture: Secondary | ICD-10-CM | POA: Diagnosis not present

## 2019-11-04 NOTE — Telephone Encounter (Signed)
PA for Nexium was submitted. Waiting on an approval through [email or web address]. lmom, pt is aware that PA is submitted.

## 2019-11-05 NOTE — Telephone Encounter (Signed)
Received an approval for Nexium from [employer]. Lmom, pt was notified. Approval letter scanned in chart.

## 2019-11-20 ENCOUNTER — Telehealth: Payer: Self-pay | Admitting: Obstetrics and Gynecology

## 2019-11-20 NOTE — Telephone Encounter (Signed)
Called patient regarding appointment and the following message was left: ° ° °We have you scheduled for an upcoming appointment at our office. At this time, we are still not allowing visitors during the appointment, however, a support person, over age 62, may accompany you to your appointment if assistance is needed for safety or care concerns. Otherwise, support persons should remain outside until the visit is complete.  ° °We ask if you are sick, have any symptoms of COVID, have had any exposure to anyone suspected or confirmed of having COVID-19, or are awaiting test results for COVID-19, to call our office as we may need to reschedule you for a virtual visit or schedule your appointment for a later date.   ° °Please know we will ask you these questions or similar questions when you arrive for your appointment and understand this is how we are keeping everyone safe.   ° °Also,to keep you safe, please use the provided hand sanitizer when you enter the office. We are asking everyone in the office to wear a mask to help prevent the spread of °germs. If you have a mask of your own, please wear it to your appointment, if not, we are happy to provide one for you. ° °Thank you for understanding and your cooperation.  ° ° °[facility] Staff ° ° ° ° ° °

## 2019-11-21 ENCOUNTER — Encounter: Payer: Self-pay | Admitting: Obstetrics and Gynecology

## 2019-11-21 ENCOUNTER — Ambulatory Visit (INDEPENDENT_AMBULATORY_CARE_PROVIDER_SITE_OTHER): Payer: BC Managed Care – PPO | Admitting: Obstetrics and Gynecology

## 2019-11-21 ENCOUNTER — Other Ambulatory Visit: Payer: Self-pay

## 2019-11-21 VITALS — BP 110/62 | HR 75 | Ht 63.0 in | Wt 162.4 lb

## 2019-11-21 DIAGNOSIS — Z01419 Encounter for gynecological examination (general) (routine) without abnormal findings: Secondary | ICD-10-CM

## 2019-11-21 NOTE — Progress Notes (Signed)
Patient ID: Yesenia Boyd, female   DOB: 04-27-1956, 64 y.o.   MRN: [id number]   Assessment:  1. Annual Gyn Exam 2. S/p hysterectomy Plan:  1. Pap smear not done, cervix removed during hysterectomy in 1980s 2. Return annually or prn 3    Annual mammogram advised after age 3 Subjective:  Yesenia Boyd is a 64 y.o. female No obstetric history on file. who presents for annual exam. No LMP recorded. Patient has had a hysterectomy. The patient notes that whenever she removes her pubic hair, she gets a bumpy rash. She either uses a razor blade or a cream that removes the hair. She has been on hormones since her hysterectomy in the 1980s. She occassionally has angina and heart palpitations. The pt has good bladder support and is able to move her bowels well. The patient is sexually active around once a month. She does breast-self exams and gets regular mammograms.  The following portions of the patient's history were reviewed and updated as appropriate: allergies, current medications, past family history, past medical history, past social history, past surgical history and problem list. Past Medical History:  Diagnosis Date  . Anginal pain (HCC)   . Asthma   . Bile reflux gastritis   . Complication of anesthesia    Deep depression following administration of propofol  . Depression    pt believes it's related to multiple sicknesses  . Family history of adverse reaction to anesthesia    sister required ventilatory support after tonsillectomy- told that " in the future need to be very careful with anesth."  . Fibromyalgia    sees [facility] Philis Kendall at [facility]  . GERD (gastroesophageal reflux disease)   . History of hiatal hernia    2 surgeries, Lap. Nissen Fundiplication   . IBS (irritable bowel syndrome)   . Magnesium deficiency   . Myasthenia gravis    right sided weakness occ.  . Myasthenia gravis in remission (HCC)   . Neuropathy   . Numbness and tingling in right hand    and whole left side of  body-pt sts she has "spells to where she cannot walk or talk"  . Peripheral neuropathy   . S/P colonoscopy April 2009, Sept 2012   normal Dr. Linna Darner, internal hemorrhoids, repeat in Sept 2022  . S/P endoscopy 2008, Aug 2010, Sept 2012   Dr. Linna Darner 2008: removal of impacted food bolus, Dr. Linna Darner 2010: moderate gastritis, Sept 2012 with SLF: path with mild gastritis, no definite stricture noted, dilation with Savary 16 mm  . Seizures (HCC)    Pt. reports -yes, pt. reports that she blanks out sometimes & the doctors have told her that's like a seizure    . Shortness of breath dyspnea    pt believes it's related to asthma  . Sleep apnea 2010   no cpap used, was told to get oxygen  to use, pt. lnows that she needs new evaluation   . Stroke [facility])    TIA 2010  . TIA (transient ischemic attack) 2012    Past Surgical History:  Procedure Laterality Date  . ABDOMINAL HYSTERECTOMY     complete per patient  . BLADDER SURGERY     stretch bladder opening  . CHOLECYSTECTOMY    . COLONOSCOPY  07/18/2011   SLF: 1. internal hemorrhoids  . COLONOSCOPY WITH PROPOFOL N/A 03/21/2017   Dr. Darrick Penna: Redundant left colon, hemorrhoids, next colonoscopy in 10 years.  Patient reports deep pression after propofol.  . ESOPHAGEAL MANOMETRY N/A  01/21/2013  . esophageal surgery?     [address]  . ESOPHAGOGASTRODUODENOSCOPY  07/18/2011   SLF: 1. Moderate gastritis 2. Dysphagia most likely 2o large food bolus moving through her Nissen, which appears to be intact. Pt has poor denttition and NL BPE 2 years ago. 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  . ESOPHAGOGASTRODUODENOSCOPY (EGD) WITH PROPOFOL N/A 08/21/2018   dr. Oneida Alar: mild gastritis, esophagus appeared normal. s/p dilation  . EVALUATION UNDER ANESTHESIA WITH HEMORRHOIDECTOMY N/A 03/27/2015   Procedure: EXAM UNDER ANESTHESIA WITH SPHINCTEROTOMY ;  Surgeon: Johnathan Hausen, MD;  Location: [facility];  Service: General;  Laterality:  N/A;  . FLEXIBLE SIGMOIDOSCOPY N/A 12/09/2014   SLF: Rectal pain most likely due to internal and external hemorroids  . HAND SURGERY     carpal tumnnel right and removal of cyst left  . HEMORRHOID BANDING N/A 12/09/2014   Procedure: HEMORRHOID BANDING;  Surgeon: Danie Binder, MD;  Location: [facility];  Service: Endoscopy;  Laterality: N/A;  . LAPAROSCOPIC NISSEN FUNDOPLICATION N/A 11/12/1094   Procedure: Redo LAPAROSCOPIC NISSEN FUNDOPLICATION;  Surgeon: Pedro Earls, MD;  Location: [facility];  Service: General;  Laterality: N/A;  Forgut explortation with partial  takedown nissan funliplication from 0454 for wrap torsion and persistant dysphagia  . NASAL SEPTOPLASTY W/ TURBINOPLASTY Bilateral 08/03/2016   Procedure: NASAL SEPTOPLASTY WITH TURBINATE REDUCTION;  Surgeon: Leta Baptist, MD;  Location: [place];  Service: ENT;  Laterality: Bilateral;  . NECK SURGERY     removal of "knot" from neck  . NISSEN FUNDOPLICATION     initially in 1994. redo in 2014  . SAVORY DILATION  07/18/2011   Surgeon:Sandi M Fields  . SAVORY DILATION  08/21/2018   Procedure: SAVORY DILATION;  Surgeon: Danie Binder, MD;  Location: [facility];  Service: Endoscopy;;     Current Outpatient Medications:  .  acetaminophen (TYLENOL) 650 MG CR tablet, Take 1,300 mg by mouth every 8 (eight) hours as needed for pain. , Disp: , Rfl:  .  alum & mag hydroxide-simeth (MYLANTA) 098-119-14 MG/5ML suspension, Take 10 mLs by mouth 2 (two) times daily. , Disp: , Rfl:  .  CARAFATE 1 GM/10ML suspension, Take 3 g by mouth as needed (heartburn, reflux). , Disp: , Rfl:  .  Cholecalciferol ([employer] VIT D 5000 HIGH-POTENCY) 5000 UNITS capsule, Take 10,000 Units by mouth every morning. , Disp: , Rfl:  .  Coenzyme Q10 (COQ10 PO), Take 10 mLs by mouth daily. , Disp: , Rfl:  .  EPINEPHrine 0.3 mg/0.3 mL IJ SOAJ injection, Inject 0.3 mg into the muscle as needed (allergic reaction)., Disp: , Rfl:  .  esomeprazole (NEXIUM) 40 MG capsule, TAKE ONE CAPSULE BY  MOUTH TWICE DAILY BEFORE A MEAL, Disp: 60 capsule, Rfl: 11 .  estrogens, conjugated, (PREMARIN) 0.625 MG tablet, Take 0.625 mg by mouth at bedtime. , Disp: , Rfl:  .  Glucosamine-Chondroitin (MOVE FREE PO), Take 1 tablet by mouth daily., Disp: , Rfl:  .  hydrOXYzine (VISTARIL) 25 MG capsule, Take 25 mg by mouth at bedtime. , Disp: , Rfl:  .  ketoconazole (NIZORAL) 2 % cream, Apply 1 application topically 2 (two) times daily., Disp: , Rfl:  .  L-Methylfolate-B6-B12 (FOLTANX) 3-35-2 MG TABS, Take 1 tablet by mouth daily., Disp: , Rfl: 0 .  loratadine (CLARITIN) 10 MG tablet, Take 10 mg by mouth daily. , Disp: , Rfl:  .  montelukast (SINGULAIR) 10 MG tablet, Take 10 mg  by mouth at bedtime., Disp: , Rfl:  .  nitroGLYCERIN (NITROSTAT) 0.4 MG SL tablet, Place 0.4 mg under the tongue every 5 (five) minutes as needed for chest pain., Disp: , Rfl:  .  rizatriptan (MAXALT) 10 MG tablet, Take 10 mg by mouth as needed for migraine., Disp: , Rfl:  .  simethicone (MYLICON) 125 MG chewable tablet, Chew 125 mg by mouth every 6 (six) hours as needed for flatulence., Disp: , Rfl:  .  tiZANidine (ZANAFLEX) 4 MG tablet, Take 6 mg by mouth at bedtime. , Disp: , Rfl:  .  vitamin E 1000 UNIT capsule, Take 1,000 Units by mouth at bedtime. , Disp: , Rfl:  .  albuterol (PROVENTIL HFA;VENTOLIN HFA) 108 (90 Base) MCG/ACT inhaler, Inhale 1-2 puffs into the lungs every 6 (six) hours as needed for wheezing or shortness of breath. , Disp: , Rfl:  .  compounded topicals builder, Apply 1 application topically 2 (two) times daily as needed (rash). Triamcinolone 0.1%, LCD 10%, SA 2% cream. Compounded by [facility]., Disp: , Rfl:  .  traZODone (DESYREL) 50 MG tablet, Take 50 mg by mouth at bedtime., Disp: , Rfl:   Review of Systems Constitutional: negative Gastrointestinal: negative Genitourinary: negative  Objective:  BP 110/62 (BP Location: Right Arm, Patient Position: Sitting, Cuff Size: Normal)   Pulse 75   Ht 5\' 3"  (1.6 m)    Wt 162 lb 6.4 oz (73.7 kg)   BMI 28.77 kg/m    BMI: Body mass index is 28.77 kg/m.  General Appearance: Alert, appropriate appearance for age. No acute distress HEENT: Grossly normal Neck / Thyroid:  Cardiovascular: RRR; normal S1, S2, no murmur Lungs: CTA bilaterally Back: No CVAT Breast Exam: No dimpling, nipple retraction or discharge. No masses or nodes., Normal to inspection, Normal breast tissue bilaterally and No masses or nodes.No dimpling, nipple retraction or discharge. Gastrointestinal: Soft, non-tender, no masses or organomegaly Pelvic Exam: Vagina: small bulge at the top of the vagina. Good pelvic support. Rectovaginal: not indicated Lymphatic Exam: Non-palpable nodes in neck, clavicular, axillary, or inguinal regions  Skin: no rash or abnormalities Neurologic: Normal gait and speech, no tremor  Psychiatric: Alert and oriented, appropriate affect.  Urinalysis:Not done  . MD Pgr [phone] 2:05 PM  By signing my name below, I, [phone], attest that this documentation has been prepared under the direction and in the presence of Pietro Cassis, MD. Electronically Signed: Tilda Burrow, Medical Scribe. 11/21/19. 2:05 PM.  I personally performed the services described in this documentation, which was SCRIBED in my presence. The recorded information has been reviewed and considered accurate. It has been edited as necessary during review. 11/23/19, MD

## 2019-11-26 DIAGNOSIS — G6289 Other specified polyneuropathies: Secondary | ICD-10-CM | POA: Diagnosis not present

## 2019-11-26 DIAGNOSIS — K219 Gastro-esophageal reflux disease without esophagitis: Secondary | ICD-10-CM | POA: Diagnosis not present

## 2019-11-26 DIAGNOSIS — Z6827 Body mass index (BMI) 27.0-27.9, adult: Secondary | ICD-10-CM | POA: Diagnosis not present

## 2019-11-26 DIAGNOSIS — F458 Other somatoform disorders: Secondary | ICD-10-CM | POA: Diagnosis not present

## 2019-11-26 DIAGNOSIS — M7918 Myalgia, other site: Secondary | ICD-10-CM | POA: Diagnosis not present

## 2019-11-28 ENCOUNTER — Other Ambulatory Visit: Payer: Self-pay

## 2019-11-28 ENCOUNTER — Ambulatory Visit (INDEPENDENT_AMBULATORY_CARE_PROVIDER_SITE_OTHER): Payer: BC Managed Care – PPO | Admitting: Gastroenterology

## 2019-11-28 ENCOUNTER — Encounter: Payer: Self-pay | Admitting: Gastroenterology

## 2019-11-28 DIAGNOSIS — R1319 Other dysphagia: Secondary | ICD-10-CM

## 2019-11-28 DIAGNOSIS — R1310 Dysphagia, unspecified: Secondary | ICD-10-CM

## 2019-11-28 MED ORDER — LIDOCAINE VISCOUS HCL 2 % MT SOLN: OROMUCOSAL | 5 refills | Status: DC

## 2019-11-28 NOTE — Patient Instructions (Signed)
DRINK WATER TO KEEP YOUR URINE LIGHT YELLOW.  FOLLOW A SOFT MECHANICAL DIET.  MEATS SHOULD BE CHOPPED OR GROUND ONLY. FRUITS/VEGGIES SHOULD BE SOFT LIKE MASHED POTATOES. SEE INFO BELOW.   USE VISCOUS LIDOCAINE 2 TSP EVERY 4 HOURS WHEN NEEDED FOR FLARES OF HEARTBURN, CHEST PAIN, OR UPPER ABDOMINAL PAIN. USE A SYRINGE TO INJECT INTO THE BACK OF YOUR THROAT. USE NO MORE THAN 8 DOSES A DAY. IT WILL MAKE YOUR MOUTH, ESOPHAGUS, AND STOMACH NUMB.  FOLLOW UP IN 6 MOS.   Please CALL or SEND me A MY CHART MESSAGE IF YOU HAVE QUESTIONS OR CONCERNS.

## 2019-11-28 NOTE — Progress Notes (Signed)
Subjective:    Patient ID: Yesenia Boyd, female    DOB: 1956/03/09, 64 y.o.   MRN: [id number] Toma Deiters, MD  HPI SWA;LLOWING PROBLEMS FOR 3-4 MOS. REFLUX FOR 3-4 MOS. YESTERDAY BROCCOLI AND TRIED A HAMBURGER(PREPACKAGED). HEARTBURN: FEELS REAL HOT AND SOMETIME SNOT. TODAY IS ONE OF THOSE DAYS. NO VOMITING OR NAUSEA. HAVING CHEST/ABDOMEN PRESSURE AND TOOK AN ENEMA AND BLOATING BETTER. BELLY FEELS BETTER BUT STILL UPPER ABDOMINAL DISCOMFORT. ALSO FEELS LIKE DEEP BURNING CIRCLE IN HER EPIGASTRIUM, NO RADIATION. CAN FEEL TENDERNESS IN LOWER ABDOMEN AS WELL.REFLUX SYMPTOMS: DRINK WATER OR FOOD IT FEELS LIKE IT'S WORKING IT'S WAY BACK UP. LIQUIDS ARE WORST THAN SOLIDS. HAVING TROUBLE WITH LOW MAGNESIUM AND CAN'T TAKE PO MEDS TO GET IT BACK UP. STARTING TO TAKE B12 SHOTS. HAD BLEEDING AFTER RECTAL EXAM LAST THUR. CONSTIPATION BETTER AFTER [employer] JUICE AND IT HELPS. HAS HEART PALPITATIONS. WEIGHT STABLE AT 160 LBS.  PT DENIES FEVER, CHILLS, HEMATEMESIS, HEMATURIA, DYSURIA, nausea, vomiting, melena, diarrhea, SHORTNESS OF BREATH, OR  CHANGE IN BOWEL IN HABITS.  Past Medical History:  Diagnosis Date  . Anginal pain (HCC)   . Asthma   . Bile reflux gastritis   . Complication of anesthesia    Deep depression following administration of propofol  . Depression    pt believes it's related to multiple sicknesses  . Family history of adverse reaction to anesthesia    sister required ventilatory support after tonsillectomy- told that " in the future need to be very careful with anesth."  . Fibromyalgia    sees Dr Philis Kendall at [facility]  . GERD (gastroesophageal reflux disease)   . History of hiatal hernia    2 surgeries, Lap. Nissen Fundiplication   . IBS (irritable bowel syndrome)   . Magnesium deficiency   . Myasthenia gravis    right sided weakness occ.  . Myasthenia gravis in remission (HCC)   . Neuropathy   . Numbness and tingling in right hand    and whole left side of body-pt sts she  has "spells to where she cannot walk or talk"  . Peripheral neuropathy   . S/P colonoscopy April 2009, Sept 2012   normal Dr. Linna Darner, internal hemorrhoids, repeat in Sept 2022  . S/P endoscopy 2008, Aug 2010, Sept 2012   Dr. Linna Darner 2008: removal of impacted food bolus, Dr. Linna Darner 2010: moderate gastritis, Sept 2012 with SLF: path with mild gastritis, no definite stricture noted, dilation with Savary 16 mm  . Seizures (HCC)    Pt. reports -yes, pt. reports that she blanks out sometimes & the doctors have told her that's like a seizure    . Shortness of breath dyspnea    pt believes it's related to asthma  . Sleep apnea 2010   no cpap used, was told to get oxygen  to use, pt. lnows that she needs new evaluation   . Stroke [facility])    TIA 2010  . TIA (transient ischemic attack) 2012    Past Surgical History:  Procedure Laterality Date  . ABDOMINAL HYSTERECTOMY     complete per patient  . BLADDER SURGERY     stretch bladder opening  . CHOLECYSTECTOMY    . COLONOSCOPY  07/18/2011   SLF: 1. internal hemorrhoids  . COLONOSCOPY WITH PROPOFOL N/A 03/21/2017   Dr. Darrick Penna: Redundant left colon, hemorrhoids, next colonoscopy in 10 years.  Patient reports deep pression after propofol.  . ESOPHAGEAL MANOMETRY N/A 01/21/2013  . esophageal surgery?  2008 [facility]  . ESOPHAGOGASTRODUODENOSCOPY  07/18/2011   SLF: 1. Moderate gastritis 2. Dysphagia most likely 2o large food bolus moving through her Nissen, which appears to be intact. Pt has poor denttition and NL BPE 2 years ago. 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  . ESOPHAGOGASTRODUODENOSCOPY (EGD) WITH PROPOFOL N/A 08/21/2018   dr. Darrick Penna: mild gastritis, esophagus appeared normal. s/p dilation  . EVALUATION UNDER ANESTHESIA WITH HEMORRHOIDECTOMY N/A 03/27/2015   Procedure: EXAM UNDER ANESTHESIA WITH SPHINCTEROTOMY ;  Surgeon: Luretha Murphy, MD;  Location: [facility] ORS;  Service: General;  Laterality: N/A;  . FLEXIBLE  SIGMOIDOSCOPY N/A 12/09/2014   SLF: Rectal pain most likely due to internal and external hemorroids  . HAND SURGERY     carpal tumnnel right and removal of cyst left  . HEMORRHOID BANDING N/A 12/09/2014   Procedure: HEMORRHOID BANDING;  Surgeon: West Bali, MD;  Location: [facility];  Service: Endoscopy;  Laterality: N/A;  . LAPAROSCOPIC NISSEN FUNDOPLICATION N/A 02/13/2013   Procedure: Redo LAPAROSCOPIC NISSEN FUNDOPLICATION;  Surgeon: Valarie Merino, MD;  Location: [facility];  Service: General;  Laterality: N/A;  Forgut explortation with partial  takedown nissan funliplication from 1994 for wrap torsion and persistant dysphagia  . NASAL SEPTOPLASTY W/ TURBINOPLASTY Bilateral 08/03/2016   Procedure: NASAL SEPTOPLASTY WITH TURBINATE REDUCTION;  Surgeon: Newman Pies, MD;  Location: [facility] OR;  Service: ENT;  Laterality: Bilateral;  . NECK SURGERY     removal of "knot" from neck  . NISSEN FUNDOPLICATION     initially in 1994. redo in 2014  . SAVORY DILATION  07/18/2011   Surgeon:Yordin Rhoda M Hosie Sharman  . SAVORY DILATION  08/21/2018   Procedure: SAVORY DILATION;  Surgeon: West Bali, MD;  Location: [facility];  Service: Endoscopy;;    Allergies  Allergen Reactions  . Aspirin Other (See Comments)    Stomach distress, rash  . Avelox [Moxifloxacin Hcl In Nacl] Anaphylaxis and Itching  . Linzess [Linaclotide] Swelling    Swelling, bloating, nausea, BLISTERS IN HER MOUTH  . Penicillins Anaphylaxis and Hives    Has patient had a PCN reaction causing immediate rash, facial/tongue/throat swelling, SOB or lightheadedness with hypotension: Yes Has patient had a PCN reaction causing severe rash involving mucus membranes or skin necrosis: Yes Has patient had a PCN reaction that required hospitalization: Yes Has patient had a PCN reaction occurring within the last 10 years: No If all of the above answers are "NO", then may proceed with Cephalosporin use.   . Adhesive [Tape] Other (See Comments)    Red and itchy  under tape  . Carbamazepine Other (See Comments)    Memory loss   . Cephalexin Hives and Itching  . Clindamycin/Lincomycin Hives and Itching    blisters  . Codeine Hives and Itching  . Cymbalta [Duloxetine Hcl] Other (See Comments)    Hair loss, nervousness, severe constipation, red blotches  . Darvon [Propoxyphene] Hives  . Dicyclomine Itching and Other (See Comments)    Nervousness  . Hydrocodone-Acetaminophen Itching  . Ketek [Telithromycin]   . Latex Itching  . Macrolides And Ketolides Swelling  . Meperidine Hcl Itching and Other (See Comments)    Nervousness  . Metronidazole Hives and Other (See Comments)    Red blotches.  . Morphine Itching and Other (See Comments)    Nervousness  . Mupirocin Itching    Made blisters worse  . Pepcid [Famotidine] Nausea And Vomiting  . Prednisone Hives, Swelling and Other (See Comments)  Red blotches - Can take with Benadryl  . Propofol Other (See Comments)    Depression  Tolerated propofol October 2019  . Protonix [Pantoprazole] Swelling  . Solodyn [Minocycline Hcl Er]   . Sulfonamide Derivatives Hives  . Tramadol Hcl Hives and Other (See Comments)    Nervousness  . Zylet [Loteprednol-Tobramycin] Hives and Swelling    Current Outpatient Medications  Medication Sig    . acetaminophen (TYLENOL) 650 MG CR tablet Take 1,300 mg by mouth every 8 (eight) hours as needed for pain.     Marland Kitchen albuterol (PROVENTIL HFA;VENTOLIN HFA) 108 (90 Base) MCG/ACT inhaler Inhale 1-2 puffs into the lungs every 6 (six) hours as needed for wheezing or shortness of breath.     Marland Kitchen alum & mag hydroxide-simeth (MYLANTA) 161-096-04 MG/5ML suspension Take 10 mLs by mouth 2 (two) times daily.     Marland Kitchen CARAFATE 1 GM/10ML suspension Take 3 g by mouth as needed (heartburn, reflux).     . Cholecalciferol ([employer] VIT D 5000 HIGH-POTENCY) 5000 UNITS capsule Take 10,000 Units by mouth every morning.     . Coenzyme Q10 (COQ10 PO) Take 10 mLs by mouth daily.     . compounded  topicals builder Apply 1 application topically 2 (two) times daily as needed (rash). Triamcinolone 0.1%, LCD 10%, SA 2% cream. Compounded by [facility].    Marland Kitchen EPINEPHrine 0.3 mg/0.3 mL IJ SOAJ injection Inject 0.3 mg into the muscle as needed (allergic reaction).    Marland Kitchen esomeprazole (NEXIUM) 40 MG capsule TAKE ONE CAPSULE BY MOUTH TWICE DAILY BEFORE A MEAL    . estrogens, conjugated, (PREMARIN) 0.625 MG tablet Take 0.625 mg by mouth at bedtime.     . Glucosamine-Chondroitin (MOVE FREE PO) Take 1 tablet by mouth daily.    . hydrOXYzine (VISTARIL) 25 MG capsule Take 25 mg by mouth at bedtime.     Marland Kitchen ketoconazole (NIZORAL) 2 % cream Apply 1 application topically 2 (two) times daily.    Marland Kitchen L-Methylfolate-B6-B12 (FOLTANX) 3-35-2 MG TABS Take 1 tablet by mouth daily.    Marland Kitchen loratadine (CLARITIN) 10 MG tablet Take 10 mg by mouth daily.     . montelukast (SINGULAIR) 10 MG tablet Take 10 mg by mouth at bedtime.    . nitroGLYCERIN (NITROSTAT) 0.4 MG SL tablet Place 0.4 mg under the tongue every 5 (five) minutes as needed for chest pain.    . rizatriptan (MAXALT) 10 MG tablet Take 10 mg by mouth as needed for migraine.    . simethicone (MYLICON) 540 MG chewable tablet Chew 125 mg by mouth every 6 (six) hours as needed for flatulence.    Marland Kitchen tiZANidine (ZANAFLEX) 4 MG tablet Take 6 mg by mouth at bedtime.     . traZODone (DESYREL) 50 MG tablet Take 50 mg by mouth at bedtime as needed.     . vitamin E 1000 UNIT capsule Take 1,000 Units by mouth at bedtime.      Review of Systems PER HPI OTHERWISE ALL SYSTEMS ARE NEGATIVE.    Objective:   Physical Exam Constitutional:      General: She is not in acute distress.    Appearance: Normal appearance.  HENT:     Mouth/Throat:     Comments: MASK IN PLACE Eyes:     General: No scleral icterus.    Pupils: Pupils are equal, round, and reactive to light.  Cardiovascular:     Rate and Rhythm: Normal rate and regular rhythm.     Pulses: Normal pulses.  Heart sounds:  Normal heart sounds.  Pulmonary:     Effort: Pulmonary effort is normal.     Breath sounds: Normal breath sounds.  Abdominal:     General: Bowel sounds are normal.     Palpations: Abdomen is soft.     Tenderness: There is abdominal tenderness. There is guarding (MILD IN EPIGASTRIUM). There is no rebound.  Musculoskeletal:     Cervical back: Normal range of motion.     Right lower leg: No edema.     Left lower leg: No edema.  Lymphadenopathy:     Cervical: No cervical adenopathy.  Skin:    General: Skin is warm and dry.  Neurological:     Mental Status: She is alert and oriented to person, place, and time.     Comments: NO  NEW FOCAL DEFICITS  Psychiatric:        Mood and Affect: Mood normal.     Comments: NORMAL AFFECT       Assessment & Plan:

## 2019-11-28 NOTE — Assessment & Plan Note (Signed)
SYMPTOMS NOT IDEALLY CONTROLLED AND LIKELY DUE TO INTERMITTENT FOOD IMPACTION. LAST BPE FEB 2020 12.5 MM TRANSIENT DELAYED AT [facility] DUE TO NISSEN FUNDOPLICATION.  DRINK WATER TO KEEP YOUR URINE LIGHT YELLOW. FOLLOW A SOFT MECHANICAL DIET.  MEATS SHOULD BE CHOPPED OR GROUND ONLY. FRUITS/VEGGIES SHOULD BE SOFT LIKE MASHED POTATOES.  HANDOUT GIVEN AND PRINCIPLES DISCUSSED. ADD VISCOUS LIDOCAINE 2 TSP EVERY 4 HOURS WHEN NEEDED FOR FLARES OF HEARTBURN, CHEST PAIN, OR UPPER ABDOMINAL PAIN. USE A SYRINGE TO INJECT INTO THE BACK OF YOUR THROAT. USE NO MORE THAN 8 DOSES A DAY. IT WILL MAKE YOUR MOUTH, ESOPHAGUS, AND STOMACH NUMB. FOLLOW UP IN 6 MOS.  Please CALL or SEND me A MY CHART MESSAGE IF YOU HAVE QUESTIONS OR CONCERNS.

## 2019-12-31 DIAGNOSIS — K219 Gastro-esophageal reflux disease without esophagitis: Secondary | ICD-10-CM | POA: Diagnosis not present

## 2019-12-31 DIAGNOSIS — Z6826 Body mass index (BMI) 26.0-26.9, adult: Secondary | ICD-10-CM | POA: Diagnosis not present

## 2019-12-31 DIAGNOSIS — F458 Other somatoform disorders: Secondary | ICD-10-CM | POA: Diagnosis not present

## 2019-12-31 DIAGNOSIS — G6289 Other specified polyneuropathies: Secondary | ICD-10-CM | POA: Diagnosis not present

## 2019-12-31 DIAGNOSIS — M7918 Myalgia, other site: Secondary | ICD-10-CM | POA: Diagnosis not present

## 2020-01-06 ENCOUNTER — Encounter: Payer: Self-pay | Admitting: *Deleted

## 2020-01-06 ENCOUNTER — Telehealth: Payer: Self-pay | Admitting: Cardiovascular Disease

## 2020-01-06 ENCOUNTER — Other Ambulatory Visit: Payer: Self-pay

## 2020-01-06 ENCOUNTER — Ambulatory Visit (INDEPENDENT_AMBULATORY_CARE_PROVIDER_SITE_OTHER): Payer: BC Managed Care – PPO | Admitting: Cardiovascular Disease

## 2020-01-06 ENCOUNTER — Encounter: Payer: Self-pay | Admitting: Cardiovascular Disease

## 2020-01-06 VITALS — BP 116/82 | HR 82 | Ht 63.0 in | Wt 162.8 lb

## 2020-01-06 DIAGNOSIS — R079 Chest pain, unspecified: Secondary | ICD-10-CM | POA: Diagnosis not present

## 2020-01-06 DIAGNOSIS — R55 Syncope and collapse: Secondary | ICD-10-CM

## 2020-01-06 DIAGNOSIS — I208 Other forms of angina pectoris: Secondary | ICD-10-CM | POA: Diagnosis not present

## 2020-01-06 NOTE — Addendum Note (Signed)
Addended by: Lesle Chris on: 01/06/2020 01:59 PM   Modules accepted: Orders

## 2020-01-06 NOTE — Telephone Encounter (Signed)
  Precert needed for: -Lexiscan - chest pain    Location: [facility]    Date: January 15, 2020

## 2020-01-06 NOTE — Telephone Encounter (Signed)
  Precert needed for:   30 day event - syncope

## 2020-01-06 NOTE — Patient Instructions (Addendum)
Medication Instructions:  Continue all current medications.  Labwork: none  Testing/Procedures:  Your physician has recommended that you wear a 30 day event monitor. Event monitors are medical devices that record the heart's electrical activity. Doctors most often [place] these monitors to diagnose arrhythmias. Arrhythmias are problems with the speed or rhythm of the heartbeat. The monitor is a small, portable device. You can wear one while you do your normal daily activities. This is usually used to diagnose what is causing palpitations/syncope (passing out). Your physician has requested that you have a lexiscan myoview. For further information please visit [email or web address] Please follow instruction sheet, as given.  Office will contact with results via phone or letter.    Follow-Up: 3 months    Any Other Special Instructions Will Be Listed Below (If Applicable).  If you need a refill on your cardiac medications before your next appointment, please call your pharmacy.

## 2020-01-06 NOTE — Progress Notes (Addendum)
CARDIOLOGY CONSULT NOTE  Patient ID: Yesenia Boyd MRN: [id number] DOB/AGE: 07/04/1956 64 y.o.  Admit date: (Not on file) Primary Physician: Toma Deiters, MD Referring Physician: Tiffany Kocher, PA-C.   Reason for Consultation: Syncope  HPI: Yesenia Boyd is a 64 y.o. female who is being seen today for the evaluation of syncope at the request of Tiffany Kocher, PA-C.  She has multiple medication intolerances.  I reviewed an echocardiogram report dated 12/21/2018 which demonstrated normal LV systolic function, EF 60%.  There was no significant valvular pathology.  ECG performed in the office today which I ordered and personally interpreted demonstrates normal sinus rhythm with no ischemic ST segment or T-wave abnormalities, nor any arrhythmias.  She told me she is to pass out in her younger years and they were attributed to seizures.  Now she is not experiencing any seizures but she has had several episodes where she passes out, sometimes while driving.  She has not been in any motor vehicle accidents but she does not drive much.  She has been having chest pain for the past year and her PCP has prescribed nitroglycerin.  Chest pain has been alleviated more recently with 2 sublingual nitroglycerin tablets rather than 1.  She denies exertional chest pain per se.  She does feel fatigued.  She occasionally has some shortness of breath when climbing stairs.  She does have intermittent palpitations.  She denies ever having undergone any cardiac ischemic testing.  Family history: Father had an MI at age 70.  Mother had an MI in her 4s.  Brother died of an MI at age 30.  Paternal grandmother had a history of syncope.    Allergies  Allergen Reactions  . Aspirin Other (See Comments)    Stomach distress, rash  . Avelox [Moxifloxacin Hcl In Nacl] Anaphylaxis and Itching  . Linzess [Linaclotide] Swelling    Swelling, bloating, nausea, BLISTERS IN HER MOUTH  . Penicillins  Anaphylaxis and Hives    Has patient had a PCN reaction causing immediate rash, facial/tongue/throat swelling, SOB or lightheadedness with hypotension: Yes Has patient had a PCN reaction causing severe rash involving mucus membranes or skin necrosis: Yes Has patient had a PCN reaction that required hospitalization: Yes Has patient had a PCN reaction occurring within the last 10 years: No If all of the above answers are "NO", then may proceed with Cephalosporin use.   . Adhesive [Tape] Other (See Comments)    Red and itchy under tape  . Carbamazepine Other (See Comments)    Memory loss   . Cephalexin Hives and Itching  . Clindamycin/Lincomycin Hives and Itching    blisters  . Codeine Hives and Itching  . Cymbalta [Duloxetine Hcl] Other (See Comments)    Hair loss, nervousness, severe constipation, red blotches  . Darvon [Propoxyphene] Hives  . Dicyclomine Itching and Other (See Comments)    Nervousness  . Hydrocodone-Acetaminophen Itching  . Ketek [Telithromycin]   . Latex Itching  . Macrolides And Ketolides Swelling  . Meperidine Hcl Itching and Other (See Comments)    Nervousness  . Metronidazole Hives and Other (See Comments)    Red blotches.  . Morphine Itching and Other (See Comments)    Nervousness  . Mupirocin Itching    Made blisters worse  . Pepcid [Famotidine] Nausea And Vomiting  . Prednisone Hives, Swelling and Other (See Comments)    Red blotches - Can take with Benadryl  . Propofol Other (See  Comments)    Depression  Tolerated propofol October 2019  . Protonix [Pantoprazole] Swelling  . Solodyn [Minocycline Hcl Er]   . Sulfonamide Derivatives Hives  . Tramadol Hcl Hives and Other (See Comments)    Nervousness  . Zylet [Loteprednol-Tobramycin] Hives and Swelling    Current Outpatient Medications  Medication Sig Dispense Refill  . acetaminophen (TYLENOL) 650 MG CR tablet Take 1,300 mg by mouth every 8 (eight) hours as needed for pain.     Marland Kitchen albuterol  (PROVENTIL HFA;VENTOLIN HFA) 108 (90 Base) MCG/ACT inhaler Inhale 1-2 puffs into the lungs every 6 (six) hours as needed for wheezing or shortness of breath.     Marland Kitchen alum & mag hydroxide-simeth (MYLANTA) 660-630-16 MG/5ML suspension Take 10 mLs by mouth 2 (two) times daily.     Marland Kitchen CARAFATE 1 GM/10ML suspension Take 3 g by mouth as needed (heartburn, reflux).     . Cholecalciferol ([employer] VIT D 5000 HIGH-POTENCY) 5000 UNITS capsule Take 10,000 Units by mouth every morning.     . Coenzyme Q10 (COQ10 PO) Take 10 mLs by mouth daily.     . compounded topicals builder Apply 1 application topically 2 (two) times daily as needed (rash). Triamcinolone 0.1%, LCD 10%, SA 2% cream. Compounded by [facility].    Marland Kitchen EPINEPHrine 0.3 mg/0.3 mL IJ SOAJ injection Inject 0.3 mg into the muscle as needed (allergic reaction).    Marland Kitchen esomeprazole (NEXIUM) 40 MG capsule TAKE ONE CAPSULE BY MOUTH TWICE DAILY BEFORE A MEAL 60 capsule 11  . estrogens, conjugated, (PREMARIN) 0.625 MG tablet Take 0.625 mg by mouth at bedtime.     . Glucosamine-Chondroitin (MOVE FREE PO) Take 1 tablet by mouth daily.    . hydrOXYzine (VISTARIL) 25 MG capsule Take 25 mg by mouth at bedtime.     Marland Kitchen ketoconazole (NIZORAL) 2 % cream Apply 1 application topically 2 (two) times daily.    Marland Kitchen L-Methylfolate-B6-B12 (FOLTANX) 3-35-2 MG TABS Take 1 tablet by mouth daily.  0  . lidocaine (XYLOCAINE) 2 % solution Using a syringe, 10 mL PO q4h PRN FOR BURNING ABDOMINAL/CHEST PAIN. NO MORE> 8 DOSES/DAY. 300 mL 5  . loratadine (CLARITIN) 10 MG tablet Take 10 mg by mouth daily.     . montelukast (SINGULAIR) 10 MG tablet Take 10 mg by mouth at bedtime.    . nitroGLYCERIN (NITROSTAT) 0.4 MG SL tablet Place 0.4 mg under the tongue every 5 (five) minutes as needed for chest pain.    . rizatriptan (MAXALT) 10 MG tablet Take 10 mg by mouth as needed for migraine.    . simethicone (MYLICON) 010 MG chewable tablet Chew 125 mg by mouth every 6 (six) hours as needed for flatulence.     Marland Kitchen tiZANidine (ZANAFLEX) 4 MG tablet Take 6 mg by mouth at bedtime.     . traZODone (DESYREL) 50 MG tablet Take 50 mg by mouth at bedtime as needed.     . vitamin E 1000 UNIT capsule Take 1,000 Units by mouth at bedtime.      No current facility-administered medications for this visit.    Past Medical History:  Diagnosis Date  . Anginal pain ([place])   . Asthma   . Bile reflux gastritis   . Complication of anesthesia    Deep depression following administration of propofol  . Depression    pt believes it's related to multiple sicknesses  . Family history of adverse reaction to anesthesia    sister required ventilatory support after tonsillectomy- told  that " in the future need to be very careful with anesth."  . Fibromyalgia    sees Dr Philis Kendall at [facility]  . GERD (gastroesophageal reflux disease)   . History of hiatal hernia    2 surgeries, Lap. Nissen Fundiplication   . IBS (irritable bowel syndrome)   . Magnesium deficiency   . Myasthenia gravis    right sided weakness occ.  . Myasthenia gravis in remission (HCC)   . Neuropathy   . Numbness and tingling in right hand    and whole left side of body-pt sts she has "spells to where she cannot walk or talk"  . Peripheral neuropathy   . S/P colonoscopy April 2009, Sept 2012   normal Dr. Linna Darner, internal hemorrhoids, repeat in Sept 2022  . S/P endoscopy 2008, Aug 2010, Sept 2012   Dr. Linna Darner 2008: removal of impacted food bolus, Dr. Linna Darner 2010: moderate gastritis, Sept 2012 with SLF: path with mild gastritis, no definite stricture noted, dilation with Savary 16 mm  . Seizures (HCC)    Pt. reports -yes, pt. reports that she blanks out sometimes & the doctors have told her that's like a seizure    . Shortness of breath dyspnea    pt believes it's related to asthma  . Sleep apnea 2010   no cpap used, was told to get oxygen  to use, pt. lnows that she needs new evaluation   . Stroke [facility])    TIA 2010  . TIA (transient ischemic attack) 2012      Past Surgical History:  Procedure Laterality Date  . ABDOMINAL HYSTERECTOMY     complete per patient  . BLADDER SURGERY     stretch bladder opening  . CHOLECYSTECTOMY    . COLONOSCOPY  07/18/2011   SLF: 1. internal hemorrhoids  . COLONOSCOPY WITH PROPOFOL N/A 03/21/2017   Dr. Darrick Penna: Redundant left colon, hemorrhoids, next colonoscopy in 10 years.  Patient reports deep pression after propofol.  . ESOPHAGEAL MANOMETRY N/A 01/21/2013  . esophageal surgery?     2008 [facility]  . ESOPHAGOGASTRODUODENOSCOPY  07/18/2011   SLF: 1. Moderate gastritis 2. Dysphagia most likely 2o large food bolus moving through her Nissen, which appears to be intact. Pt has poor denttition and NL BPE 2 years ago. 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  . ESOPHAGOGASTRODUODENOSCOPY (EGD) WITH PROPOFOL N/A 08/21/2018   dr. Darrick Penna: mild gastritis, esophagus appeared normal. s/p dilation  . EVALUATION UNDER ANESTHESIA WITH HEMORRHOIDECTOMY N/A 03/27/2015   Procedure: EXAM UNDER ANESTHESIA WITH SPHINCTEROTOMY ;  Surgeon: Luretha Murphy, MD;  Location: [facility] ORS;  Service: General;  Laterality: N/A;  . FLEXIBLE SIGMOIDOSCOPY N/A 12/09/2014   SLF: Rectal pain most likely due to internal and external hemorroids  . HAND SURGERY     carpal tumnnel right and removal of cyst left  . HEMORRHOID BANDING N/A 12/09/2014   Procedure: HEMORRHOID BANDING;  Surgeon: West Bali, MD;  Location: [facility] ORS;  Service: Endoscopy;  Laterality: N/A;  . LAPAROSCOPIC NISSEN FUNDOPLICATION N/A 02/13/2013   Procedure: Redo LAPAROSCOPIC NISSEN FUNDOPLICATION;  Surgeon: Valarie Merino, MD;  Location: [facility] OR[facility];  Service: General;  Laterality: N/A;  Forgut explortation with partial  takedown nissan funliplication from 1994 for wrap torsion and persistant dysphagia  . NASAL SEPTOPLASTY W/ TURBINOPLASTY Bilateral 08/03/2016   Procedure: NASAL SEPTOPLASTY WITH TURBINATE REDUCTION;  Surgeon: Newman Pies, MD;  Location: [facility] OR;   Service: ENT;  Laterality: Bilateral;  . NECK SURGERY  removal of "knot" from neck  . NISSEN FUNDOPLICATION     initially in 1994. redo in 2014  . SAVORY DILATION  07/18/2011   Surgeon:Sandi M Fields  . SAVORY DILATION  08/21/2018   Procedure: SAVORY DILATION;  Surgeon: West Bali, MD;  Location: [facility] SUITE;  Service: Endoscopy;;    Social History   Socioeconomic History  . Marital status: Married    Spouse name: Not on file  . Number of children: Not on file  . Years of education: Not on file  . Highest education level: Not on file  Occupational History  . Not on file  Tobacco Use  . Smoking status: Never Smoker  . Smokeless tobacco: Never Used  Substance and Sexual Activity  . Alcohol use: Yes    Comment: drinks wine "every blue moon"  . Drug use: No  . Sexual activity: Yes    Birth control/protection: Surgical  Other Topics Concern  . Not on file  Social History Narrative  . Not on file   Social Determinants of Health   Financial Resource Strain:   . Difficulty of Paying Living Expenses: Not on file  Food Insecurity:   . Worried About Programme researcher, broadcasting/film/video in the Last Year: Not on file  . Ran Out of Food in the Last Year: Not on file  Transportation Needs:   . Lack of Transportation (Medical): Not on file  . Lack of Transportation (Non-Medical): Not on file  Physical Activity:   . Days of Exercise per Week: Not on file  . Minutes of Exercise per Session: Not on file  Stress:   . Feeling of Stress : Not on file  Social Connections:   . Frequency of Communication with Friends and Family: Not on file  . Frequency of Social Gatherings with Friends and Family: Not on file  . Attends Religious Services: Not on file  . Active Member of Clubs or Organizations: Not on file  . Attends Banker Meetings: Not on file  . Marital Status: Not on file  Intimate Partner Violence:   . Fear of Current or Ex-Partner: Not on file  . Emotionally Abused: Not  on file  . Physically Abused: Not on file  . Sexually Abused: Not on file      No outpatient medications have been marked as taking for the 01/06/20 encounter (Office Visit) with Laqueta Linden, MD.      Review of systems complete and found to be negative unless listed above in HPI   [facility] LPN was present throughout the entirety of the encounter.  Physical exam Blood pressure 116/79, pulse 73, height 5\' 3"  (1.6 m), weight 162 lb 12.8 oz (73.8 kg), SpO2 98 %. General: NAD Neck: No JVD, no thyromegaly or thyroid nodule.  Lungs: Clear to auscultation bilaterally with normal respiratory effort. CV: Nondisplaced PMI. Regular rate and rhythm, normal S1/S2, no S3/S4, no murmur.  No peripheral edema.  No carotid bruit.    Abdomen: Soft, nontender, no distention.  Skin: Intact without lesions or rashes.  Neurologic: Alert and oriented x 3.  Psych: Normal affect. Extremities: No clubbing or cyanosis.  HEENT: Normal.   ECG: Most recent ECG reviewed.   Labs: Lab Results  Component Value Date/Time   K 3.6 08/14/2018 03:27 PM   BUN 12 08/14/2018 03:27 PM   CREATININE 0.87 08/14/2018 03:27 PM   ALT 15 02/04/2013 03:20 PM   HGB 11.7 (L) 08/14/2018 03:27 PM  Lipids: No results found for: LDLCALC, LDLDIRECT, CHOL, TRIG, HDL      ASSESSMENT AND PLAN:   1.  Syncope: Normal echocardiogram in February 2020.  ECG performed today does not reveal any abnormalities.  Orthostatic blood pressures were checked today and all found to be normal.  Heart rate remained in the 70 bpm range.  I will obtain a 30-day event monitor.  Given her concomitant history of chest pain, I will also evaluate for obstructive coronary artery disease with a Lexiscan Myoview.  2.  Chest pain: She has had chest pain intermittently over the past year for which her PCP has prescribed nitroglycerin.  Recently, she has had to take 2 tablets to alleviate symptoms.  She does have a strong family history of heart  disease.  I prefer to proceed with coronary CT angiography but she apparently has an adverse reaction to steroids.  She also has a contrast allergy.  I will obtain a Lexiscan Myoview.   Disposition: Follow up in 3 months virtual visit  Signed: Prentice Docker, M.D., F.A.C.C.  01/06/2020, 1:30 PM

## 2020-01-07 ENCOUNTER — Telehealth: Payer: Self-pay | Admitting: Cardiovascular Disease

## 2020-01-07 NOTE — Telephone Encounter (Signed)
Informed patient no longer need lab (BMET) since she is not doing the Coronary CT.  Test was changed to the [facility] & does not require any labs.  Patient verbalized understanding.

## 2020-01-07 NOTE — Telephone Encounter (Signed)
Patient called requesting orders for blood work.

## 2020-01-10 ENCOUNTER — Telehealth: Payer: Self-pay | Admitting: Cardiovascular Disease

## 2020-01-10 NOTE — Telephone Encounter (Signed)
Asking when she will receive her portable device

## 2020-01-10 NOTE — Telephone Encounter (Signed)
30 day [employer] monitor enrolled on 01/06/2020.

## 2020-01-10 NOTE — Telephone Encounter (Signed)
Pt aware that mail is slower than normal when sending out monitors - said she had not received a call either to verify insurance - did not see enrollment in [employer] - will forward to nurse for f/u

## 2020-01-15 ENCOUNTER — Other Ambulatory Visit: Payer: Self-pay

## 2020-01-15 ENCOUNTER — Ambulatory Visit (HOSPITAL_COMMUNITY)
Admission: RE | Admit: 2020-01-15 | Discharge: 2020-01-15 | Disposition: A | Discharge status: Home / Self Care | Payer: BC Managed Care – PPO | Source: Ambulatory Visit | Attending: Cardiovascular Disease | Admitting: Cardiovascular Disease

## 2020-01-15 ENCOUNTER — Ambulatory Visit (INDEPENDENT_AMBULATORY_CARE_PROVIDER_SITE_OTHER): Payer: BC Managed Care – PPO

## 2020-01-15 ENCOUNTER — Encounter (HOSPITAL_COMMUNITY)
Admission: RE | Admit: 2020-01-15 | Discharge: 2020-01-15 | Disposition: A | Discharge status: Home / Self Care | Payer: BC Managed Care – PPO | Source: Ambulatory Visit | Attending: Cardiovascular Disease | Admitting: Cardiovascular Disease

## 2020-01-15 DIAGNOSIS — R079 Chest pain, unspecified: Secondary | ICD-10-CM

## 2020-01-15 DIAGNOSIS — I208 Other forms of angina pectoris: Secondary | ICD-10-CM | POA: Diagnosis not present

## 2020-01-15 DIAGNOSIS — R55 Syncope and collapse: Secondary | ICD-10-CM | POA: Diagnosis not present

## 2020-01-15 LAB — NM MYOCAR MULTI W/SPECT W/WALL MOTION / EF
LV dias vol: 63 mL (ref 46–106)
LV sys vol: 25 mL
Peak HR: 93 {beats}/min
RATE: 0.67
Rest HR: 66 {beats}/min
SDS: 0
SRS: 2
SSS: 2
TID: 1.22

## 2020-01-15 MED ORDER — SODIUM CHLORIDE FLUSH 0.9 % IV SOLN
INTRAVENOUS | Status: AC
  Administered 2020-01-15: 10 mL via INTRAVENOUS
  Filled 2020-01-15: qty 10

## 2020-01-15 MED ORDER — TECHNETIUM TC 99M TETROFOSMIN IV KIT
30.0000 | PACK | Freq: Once | INTRAVENOUS | Status: AC | PRN
  Administered 2020-01-15: 11:00:00 33 via INTRAVENOUS

## 2020-01-15 MED ORDER — REGADENOSON 0.4 MG/5ML IV SOLN
INTRAVENOUS | Status: AC
  Administered 2020-01-15: 0.4 mg via INTRAVENOUS
  Filled 2020-01-15: qty 5

## 2020-01-15 MED ORDER — TECHNETIUM TC 99M TETROFOSMIN IV KIT
10.0000 | PACK | Freq: Once | INTRAVENOUS | Status: AC | PRN
  Administered 2020-01-15: 10.9 via INTRAVENOUS

## 2020-01-22 ENCOUNTER — Telehealth: Payer: Self-pay | Admitting: *Deleted

## 2020-01-22 NOTE — Telephone Encounter (Signed)
-----   Message from Nori Riis, RN sent at 01/15/2020  3:47 PM EST -----  ----- Message ----- From: Laqueta Linden, MD Sent: 01/15/2020   2:08 PM EST To: Nori Riis, RN  Normal stress test.  No evidence of blockages.

## 2020-01-22 NOTE — Telephone Encounter (Signed)
Yesenia Boyd, [place]  4/32/7614 7:09 PM EDT    Patient notified. Copy to pmd. Follow up scheduled for June with Dr. Purvis Sheffield.

## 2020-01-27 ENCOUNTER — Telehealth: Payer: Self-pay | Admitting: Cardiovascular Disease

## 2020-01-27 DIAGNOSIS — Z1231 Encounter for screening mammogram for malignant neoplasm of breast: Secondary | ICD-10-CM | POA: Diagnosis not present

## 2020-01-27 NOTE — Telephone Encounter (Signed)
Patient called stating that she is having issues with her heart monitor.  °

## 2020-01-28 NOTE — Telephone Encounter (Signed)
Reports sensitive electrodes that were sent also caused a rash and wouldn't stay on. Reports using correct technique for placing monitor. Reports [employer] is sending her another electrode that she will try once they arrive.

## 2020-03-03 ENCOUNTER — Telehealth: Payer: Self-pay | Admitting: *Deleted

## 2020-03-03 ENCOUNTER — Ambulatory Visit: Payer: Medicare Other | Attending: Internal Medicine

## 2020-03-03 DIAGNOSIS — Z23 Encounter for immunization: Secondary | ICD-10-CM

## 2020-03-03 NOTE — Progress Notes (Addendum)
   Covid-19 Vaccination Clinic  Name:  Yesenia Boyd    MRN: [id number] DOB: 04/18/1956  03/03/2020  Yesenia Boyd was observed post Covid-19 immunization for 15 minutes .  During the observation period, she experienced an adverse reaction with the following symptoms:  dizziness.feeling faint.  Assessment : Time of assessment 1202. Alert and oriented. Patient stated she felt dizzy and faint.  Patient vital signs taken, patient given water and snack.    Actions taken:  Vitals sign taken 1202- BP 143/72, HR 74 oxygen level 99%.  1210- BP 150/75, HR 78 oxygen level 100%.    There were no vitals filed for this visit.  Medications administered: No medication administered.  Disposition: Reports no further symptoms of adverse reaction after observation for 15 minutes. Discharged home. Instructed to call 911 for trouble breathing, rapid heart rate, dizziness, swelling of tongue or throat.  Instructed to follow-up with PCP if dizziness reoccurred.  Patient discharged home at 1228.   Immunizations Administered    Name Date Dose VIS Date Route   Moderna COVID-19 Vaccine 03/03/2020 11:47 AM 0.5 mL 10/2019 Intramuscular   Manufacturer: Moderna   Lot: [id number]   NDC: 64680-321-22

## 2020-03-03 NOTE — Telephone Encounter (Signed)
Called and spoke with patient in regard to her second Moderna vaccine that she received on 03/03/2020. She stated that she felt dizzy and has a little bit of diarrhea now but attributes this mainly to her nerves. Advised to the patient that this does not see likely of an allergic reaction and that she should be fine. Advised to patient that if she does have any questions or concerns to please feel free to call me back. Patient verbalized understanding.

## 2020-03-09 ENCOUNTER — Telehealth: Payer: Self-pay | Admitting: Emergency Medicine

## 2020-03-09 NOTE — Telephone Encounter (Addendum)
PLEASE CALL PT. SHE SHOULD AVOID ITEMS THAT CAUSE BLOATING & GAS. IF FOLLOWS THE RECOMMENDATION, SHE WILL HAVE LESS SMELLY GAS. WE WILL MAIL YOU A COPY OF THE HANDOUT.  BLOATING AND GAS PREVENTION  Although gas may be uncomfortable and embarrassing, it is not life-threatening. Understanding causes, ways to reduce symptoms, and treatment will help most people find some relief. Points to remember . Everyone has gas in the digestive tract. Marland Kitchen People often believe normal passage of gas to be excessive. . Gas comes from two main sources: swallowed air and normal breakdown of certain foods by harmless bacteria naturally present in the large intestine. . Many foods with carbohydrates can cause gas. Fats and proteins cause little gas. . Foods that may cause gas include o beans  o vegetables, such as broccoli, cabbage, brussels sprouts, onions, artichokes, and asparagus  o fruits, such as pears, apples, and peaches  o whole grains, such as whole wheat and bran  o soft drinks and fruit drinks  o milk and milk products, such as cheese and ice cream, and packaged foods prepared with lactose, such as bread, cereal, and salad dressing  o foods containing sorbitol, such as dietetic foods and sugar free candies and gums . The most common symptoms of gas are belching, flatulence, bloating, and abdominal pain. However, some of these symptoms are often caused by an intestinal disorder, such as irritable bowel syndrome, rather than too much gas. . The most common ways to reduce the discomfort of gas are changing diet, taking nonprescription medicines, and reducing the amount of air swallowed. . Digestive enzymes, such as lactase supplements, actually help digest carbohydrates and may allow people to eat foods that normally cause gas.

## 2020-03-09 NOTE — Telephone Encounter (Signed)
Pt notified of providers recommendations. Letter mailed

## 2020-03-09 NOTE — Telephone Encounter (Signed)
Pt called and stated she has gas a lot with stool. She stated this just started and sometimes she is having diarrhea and abd pain. Pt stated she is having some rectal bleeding but that is because she has Hemorids . Pt stated she is taking gasx  and that she is a little nauseous. Pt stated this started about two months ago. And  When she passes gas the smell is very bad. Denies being on antibiotics recently.

## 2020-03-26 ENCOUNTER — Telehealth: Payer: Self-pay | Admitting: *Deleted

## 2020-03-26 NOTE — Telephone Encounter (Signed)
Patient called in. Received a letter from insurance that her nexium will not be covered any longer. She can be reached at [phone] or [phone]

## 2020-03-26 NOTE — Telephone Encounter (Signed)
Spoke with pt. Pts insurance doesn't want to cover Nexium twice daily and will cover once daily. A PA has been submitted. Waiting on an approval or denial.

## 2020-03-31 ENCOUNTER — Encounter: Payer: Self-pay | Admitting: Gastroenterology

## 2020-04-01 NOTE — Telephone Encounter (Signed)
PA for Nexium 40 mg was approved through pts insurance 11/04/2019-11/02/2022. Pt is aware of approval.

## 2020-05-03 ENCOUNTER — Other Ambulatory Visit: Payer: Self-pay | Admitting: Nurse Practitioner

## 2020-05-04 ENCOUNTER — Telehealth: Payer: Medicare Other | Admitting: Cardiovascular Disease

## 2020-06-17 ENCOUNTER — Ambulatory Visit (INDEPENDENT_AMBULATORY_CARE_PROVIDER_SITE_OTHER): Payer: BC Managed Care – PPO | Admitting: Internal Medicine

## 2020-06-17 ENCOUNTER — Encounter: Payer: Self-pay | Admitting: Internal Medicine

## 2020-06-17 ENCOUNTER — Other Ambulatory Visit: Payer: Self-pay | Admitting: Internal Medicine

## 2020-06-17 ENCOUNTER — Other Ambulatory Visit: Payer: Self-pay

## 2020-06-17 VITALS — BP 118/81 | HR 81 | Temp 96.6°F | Ht 63.0 in | Wt 166.4 lb

## 2020-06-17 DIAGNOSIS — R1319 Other dysphagia: Secondary | ICD-10-CM

## 2020-06-17 DIAGNOSIS — R1310 Dysphagia, unspecified: Secondary | ICD-10-CM | POA: Diagnosis not present

## 2020-06-17 DIAGNOSIS — K649 Unspecified hemorrhoids: Secondary | ICD-10-CM

## 2020-06-17 DIAGNOSIS — R1013 Epigastric pain: Secondary | ICD-10-CM | POA: Insufficient documentation

## 2020-06-17 DIAGNOSIS — K219 Gastro-esophageal reflux disease without esophagitis: Secondary | ICD-10-CM | POA: Diagnosis not present

## 2020-06-17 NOTE — Progress Notes (Signed)
Primary Care Physician:  Toma Deiters, MD Primary Gastroenterologist:  Dr.   Antony Contras Complaint  Patient presents with  . Dysphagia    difficulty w/meds, liquids. Doing mech soft diet  . Abdominal Cramping  . abdominal swelling  . Hemorrhoids    pain, no bleeding; has to strain w/bm    HPI:   Yesenia Boyd is a 64 y.o. female who presents to the clinic today for worsening dysphagia. She has a complicated past GI history including previous hiatal hernia status post 2 Niesen fundoplication's.  Her last EGD Was in October 2019 where she underwent dilation with 17 mm savary dilator.  She states she continues to struggle with dysphagia but is not wanting to undergo repeat dilation at this time as she feels as though it makes her esophagus less strong.  She is primarily on mechanical soft diet.  She also takes as needed Carafate as well as Nexium.  She has tried lidocaine in the past but states it gives her an uncomfortable feeling.  Last colonoscopy was in 2018 which was largely unremarkable besides internal and external hemorrhoids.  She notes hemorrhoid banding in the past which did not help her rectal bleeding.  She does continue to have issues with a external hemorrhoid on occasion.  Notes some discomfort and occasional bleeding.  Past Medical History:  Diagnosis Date  . Anginal pain (HCC)   . Asthma   . Bile reflux gastritis   . Complication of anesthesia    Deep depression following administration of propofol  . Depression    pt believes it's related to multiple sicknesses  . Family history of adverse reaction to anesthesia    sister required ventilatory support after tonsillectomy- told that " in the future need to be very careful with anesth."  . Fibromyalgia    sees Dr Philis Kendall at [facility]  . GERD (gastroesophageal reflux disease)   . History of hiatal hernia    2 surgeries, Lap. Nissen Fundiplication   . IBS (irritable bowel syndrome)   . Magnesium deficiency   . Myasthenia  gravis    right sided weakness occ.  . Myasthenia gravis in remission (HCC)   . Neuropathy   . Numbness and tingling in right hand    and whole left side of body-pt sts she has "spells to where she cannot walk or talk"  . Peripheral neuropathy   . S/P colonoscopy April 2009, Sept 2012   normal Dr. Linna Darner, internal hemorrhoids, repeat in Sept 2022  . S/P endoscopy 2008, Aug 2010, Sept 2012   Dr. Linna Darner 2008: removal of impacted food bolus, Dr. Linna Darner 2010: moderate gastritis, Sept 2012 with SLF: path with mild gastritis, no definite stricture noted, dilation with Savary 16 mm  . Seizures (HCC)    Pt. reports -yes, pt. reports that she blanks out sometimes & the doctors have told her that's like a seizure    . Shortness of breath dyspnea    pt believes it's related to asthma  . Sleep apnea 2010   no cpap used, was told to get oxygen  to use, pt. lnows that she needs new evaluation   . Stroke [facility])    TIA 2010  . TIA (transient ischemic attack) 2012    Past Surgical History:  Procedure Laterality Date  . ABDOMINAL HYSTERECTOMY     complete per patient  . BLADDER SURGERY     stretch bladder opening  . CHOLECYSTECTOMY    . COLONOSCOPY  07/18/2011  SLF: 1. internal hemorrhoids  . COLONOSCOPY WITH PROPOFOL N/A 03/21/2017   Dr. Darrick Penna: Redundant left colon, hemorrhoids, next colonoscopy in 10 years.  Patient reports deep pression after propofol.  . ESOPHAGEAL MANOMETRY N/A 01/21/2013  . esophageal surgery?     2008 [facility]  . ESOPHAGOGASTRODUODENOSCOPY  07/18/2011   SLF: 1. Moderate gastritis 2. Dysphagia most likely 2o large food bolus moving through her Nissen, which appears to be intact. Pt has poor denttition and NL BPE 2 years ago. 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  . ESOPHAGOGASTRODUODENOSCOPY (EGD) WITH PROPOFOL N/A 08/21/2018   dr. Darrick Penna: mild gastritis, esophagus appeared normal. s/p dilation  . EVALUATION UNDER ANESTHESIA WITH  HEMORRHOIDECTOMY N/A 03/27/2015   Procedure: EXAM UNDER ANESTHESIA WITH SPHINCTEROTOMY ;  Surgeon: Luretha Murphy, MD;  Location: [facility];  Service: General;  Laterality: N/A;  . FLEXIBLE SIGMOIDOSCOPY N/A 12/09/2014   SLF: Rectal pain most likely due to internal and external hemorroids  . HAND SURGERY     carpal tumnnel right and removal of cyst left  . HEMORRHOID BANDING N/A 12/09/2014   Procedure: HEMORRHOID BANDING;  Surgeon: West Bali, MD;  Location: [facility];  Service: Endoscopy;  Laterality: N/A;  . LAPAROSCOPIC NISSEN FUNDOPLICATION N/A 02/13/2013   Procedure: Redo LAPAROSCOPIC NISSEN FUNDOPLICATION;  Surgeon: Valarie Merino, MD;  Location: [facility];  Service: General;  Laterality: N/A;  Forgut explortation with partial  takedown nissan funliplication from 1994 for wrap torsion and persistant dysphagia  . NASAL SEPTOPLASTY W/ TURBINOPLASTY Bilateral 08/03/2016   Procedure: NASAL SEPTOPLASTY WITH TURBINATE REDUCTION;  Surgeon: Newman Pies, MD;  Location: [facility] OR;  Service: ENT;  Laterality: Bilateral;  . NECK SURGERY     removal of "knot" from neck  . NISSEN FUNDOPLICATION     initially in 1994. redo in 2014  . SAVORY DILATION  07/18/2011   Surgeon:Sandi M Fields  . SAVORY DILATION  08/21/2018   Procedure: SAVORY DILATION;  Surgeon: West Bali, MD;  Location: [facility] SU[facility];  Service: Endoscopy;;    Current Outpatient Medications  Medication Sig Dispense Refill  . acetaminophen (TYLENOL) 650 MG CR tablet Take 1,300 mg by mouth every 8 (eight) hours as needed for pain.     Marland Kitchen albuterol (PROVENTIL HFA;VENTOLIN HFA) 108 (90 Base) MCG/ACT inhaler Inhale 1-2 puffs into the lungs every 6 (six) hours as needed for wheezing or shortness of breath.     Marland Kitchen alum & mag hydroxide-simeth (MYLANTA) 200-200-20 MG/5ML suspension Take 10 mLs by mouth 2 (two) times daily.     Marland Kitchen CARAFATE 1 GM/10ML suspension Take 3 g by mouth as needed (heartburn, reflux).     . Cholecalciferol ([employer] VIT D 5000 HIGH-POTENCY) 5000  UNITS capsule Take 10,000 Units by mouth every morning.     . Coenzyme Q10 (COQ10 PO) Take 10 mLs by mouth daily.     . cyanocobalamin (,VITAMIN B-12,) 1000 MCG/ML injection Inject 1,000 mcg into the muscle every 30 (thirty) days.    Marland Kitchen EPINEPHrine 0.3 mg/0.3 mL IJ SOAJ injection Inject 0.3 mg into the muscle as needed (allergic reaction).    Marland Kitchen esomeprazole (NEXIUM) 40 MG capsule TAKE ONE CAPSULE BY MOUTH TWICE DAILY BEFORE A MEAL 60 capsule 11  . estrogens, conjugated, (PREMARIN) 0.625 MG tablet Take 0.625 mg by mouth at bedtime.     . Glucosamine-Chondroitin (MOVE FREE PO) Take 1 tablet by mouth daily.    . hydrOXYzine (VISTARIL) 25 MG capsule Take 25 mg  by mouth at bedtime.     Marland Kitchen ketoconazole (NIZORAL) 2 % cream Apply 1 application topically 2 (two) times daily.    Marland Kitchen L-Methylfolate-B6-B12 (FOLTANX) 3-35-2 MG TABS Take 1 tablet by mouth daily.  0  . lidocaine (XYLOCAINE) 2 % solution Using a syringe, 10 mL PO q4h PRN FOR BURNING ABDOMINAL/CHEST PAIN. NO MORE> 8 DOSES/DAY. 300 mL 5  . loratadine (CLARITIN) 10 MG tablet Take 10 mg by mouth daily.     . montelukast (SINGULAIR) 10 MG tablet Take 10 mg by mouth at bedtime.    . Multiple Vitamins-Minerals (WOMENS MULTIVITAMIN PO) Take by mouth daily.    . nitroGLYCERIN (NITROSTAT) 0.4 MG SL tablet Place 0.4 mg under the tongue every 5 (five) minutes as needed for chest pain.    . rizatriptan (MAXALT) 10 MG tablet Take 10 mg by mouth as needed for migraine.    . simethicone (MYLICON) 125 MG chewable tablet Chew 125 mg by mouth every 6 (six) hours as needed for flatulence.    Marland Kitchen tiZANidine (ZANAFLEX) 4 MG tablet Take 6 mg by mouth at bedtime.     . traZODone (DESYREL) 50 MG tablet Take 50 mg by mouth at bedtime as needed.     . vitamin E 1000 UNIT capsule Take 1,000 Units by mouth at bedtime.     . compounded topicals builder Apply 1 application topically 2 (two) times daily as needed (rash). Triamcinolone 0.1%, LCD 10%, SA 2% cream. Compounded by [facility]. (Patient not taking: Reported on 06/17/2020)     No current facility-administered medications for this visit.    Allergies as of 06/17/2020 - Review Complete 06/17/2020  Allergen Reaction Noted  . Aspirin Other (See Comments) 07/28/2016  . Avelox [moxifloxacin hcl in nacl] Anaphylaxis and Itching 06/20/2011  . Linzess [linaclotide] Swelling 02/04/2013  . Penicillins Anaphylaxis and Hives   . Adhesive [tape] Other (See Comments) 01/24/2013  . Carbamazepine Other (See Comments) 03/16/2015  . Cephalexin Hives and Itching   . Clindamycin/lincomycin Hives and Itching 12/17/2018  . Codeine Hives and Itching   . Cymbalta [duloxetine hcl] Other (See Comments) 06/20/2011  . Darvon [propoxyphene] Hives 03/17/2015  . Dicyclomine Itching and Other (See Comments) 06/20/2011  . Hydrocodone-acetaminophen Itching 03/15/2017  . Ketek [telithromycin]  09/06/2017  . Latex Itching 01/24/2013  . Macrolides and ketolides Swelling 07/18/2011  . Meperidine hcl Itching and Other (See Comments)   . Metronidazole Hives and Other (See Comments) 06/20/2011  . Morphine Itching and Other (See Comments)   . Mupirocin Itching 12/17/2018  . Pepcid [famotidine] Nausea And Vomiting 03/16/2015  . Prednisone Hives, Swelling, and Other (See Comments) 06/20/2011  . Propofol Other (See Comments) 03/17/2015  . Protonix [pantoprazole] Swelling 03/17/2015  . Solodyn [minocycline hcl er]  11/21/2019  . Sulfonamide derivatives Hives   . Tramadol hcl Hives and Other (See Comments)   . Zylet [loteprednol-tobramycin] Hives and Swelling 03/15/2017    Family History  Problem Relation Age of Onset  . Anesthesia problems Sister   . Cancer Sister        breast  . Hypertension Sister   . Parkinson's disease Mother   . Kidney disease Mother   . Diabetes Mother   . Heart attack Father   . Cancer Father        stomach cancer  . Hypertension Brother   . Diabetes Brother   . Arthritis Brother   . Kidney disease Brother    . Parkinson's disease Brother   . Cancer  Maternal Aunt        breast  . Cancer Maternal Grandmother        breast  . Multiple sclerosis Daughter   . Colon cancer Cousin        4 cousins  . Pseudochol deficiency Neg Hx   . Hypotension Neg Hx   . Malignant hyperthermia Neg Hx     Social History   Socioeconomic History  . Marital status: Married    Spouse name: Not on file  . Number of children: Not on file  . Years of education: Not on file  . Highest education level: Not on file  Occupational History  . Not on file  Tobacco Use  . Smoking status: Never Smoker  . Smokeless tobacco: Never Used  Vaping Use  . Vaping Use: Never used  Substance and Sexual Activity  . Alcohol use: Yes    Comment: drinks wine "every blue moon"  . Drug use: No  . Sexual activity: Yes    Birth control/protection: Surgical  Other Topics Concern  . Not on file  Social History Narrative  . Not on file   Social Determinants of Health   Financial Resource Strain:   . Difficulty of Paying Living Expenses:   Food Insecurity:   . Worried About Programme researcher, broadcasting/film/video in the Last Year:   . Barista in the Last Year:   Transportation Needs:   . Freight forwarder (Medical):   Marland Kitchen Lack of Transportation (Non-Medical):   Physical Activity:   . Days of Exercise per Week:   . Minutes of Exercise per Session:   Stress:   . Feeling of Stress :   Social Connections:   . Frequency of Communication with Friends and Family:   . Frequency of Social Gatherings with Friends and Family:   . Attends Religious Services:   . Active Member of Clubs or Organizations:   . Attends Banker Meetings:   Marland Kitchen Marital Status:   Intimate Partner Violence:   . Fear of Current or Ex-Partner:   . Emotionally Abused:   Marland Kitchen Physically Abused:   . Sexually Abused:     Subjective: Review of Systems  Constitutional: Negative for chills and fever.  HENT: Negative for congestion and hearing loss.     Eyes: Negative for blurred vision and double vision.  Respiratory: Negative for cough and shortness of breath.   Cardiovascular: Negative for chest pain and palpitations.  Gastrointestinal: Positive for abdominal pain. Negative for blood in stool, constipation, diarrhea, heartburn, melena and vomiting.       Dysphagia  Genitourinary: Negative for dysuria and urgency.  Musculoskeletal: Negative for joint pain and myalgias.  Skin: Negative for itching and rash.  Neurological: Positive for headaches. Negative for dizziness.  Psychiatric/Behavioral: Negative for depression. The patient is not nervous/anxious.        Objective: BP 118/81   Pulse 81   Temp (!) 96.6 F (35.9 C) (Temporal)   Ht 5\' 3"  (1.6 m)   Wt 166 lb 6.4 oz (75.5 kg)   BMI 29.48 kg/m  Physical Exam Constitutional:      Appearance: Normal appearance.  HENT:     Head: Normocephalic and atraumatic.  Eyes:     Extraocular Movements: Extraocular movements intact.     Conjunctiva/sclera: Conjunctivae normal.  Cardiovascular:     Rate and Rhythm: Normal rate and regular rhythm.  Pulmonary:     Effort: Pulmonary effort is normal.  Breath sounds: Normal breath sounds.  Abdominal:     General: Bowel sounds are normal.     Palpations: Abdomen is soft.  Musculoskeletal:        General: No swelling. Normal range of motion.     Cervical back: Normal range of motion and neck supple.  Skin:    General: Skin is warm and dry.     Coloration: Skin is not jaundiced.  Neurological:     General: No focal deficit present.     Mental Status: She is alert and oriented to person, place, and time.  Psychiatric:        Mood and Affect: Mood normal.        Behavior: Behavior normal.    Assessment: *Dyspepsia *Chronic GERD *Dysphagia *History of Niesen fundoplication x2 *Rectal bleeding *Internal and external hemorrhoids  Plan: -Patient's GI history is quite complicated.  I discussed possibly repeating an EGD with  further dilation given her worsening dysphagia at this time.  She would like to hold off for now as she is worried that these dilations are making her esophagus "weak." -We will trial her on GI cocktail and monitor for improvement. -Continue to take Nexium twice daily.  Okay to break up the capsule as she has been doing. -Discussed hemorrhoids in depth with patient today including avoiding straining.  She states she has regular bowel movements at the moment.  She does have a history of constipation in the past we will continue to monitor at this time.  She can take sitz bath's in the meantime to help with discomfort.  We will also consider Anusol in the future. -Continue mechanical l soft diet for now -Patient follow-up in 6 to 8 weeks or sooner if needed.   06/17/2020 9:29 AM   Disclaimer: This note was dictated with voice recognition software. Similar sounding words can inadvertently be transcribed and may not be corrected upon review.

## 2020-06-17 NOTE — Patient Instructions (Signed)
Continue on current medications  Will start on GI cocktail to use up to 4 times a day  Continue on mechanical soft diet  Will monitor hemorrhoid for now. If worsens, may consider adding Laxative.   Follow up with Dr. Marletta Lor in 6-8 weeks

## 2020-06-22 ENCOUNTER — Telehealth: Payer: Self-pay | Admitting: Internal Medicine

## 2020-06-22 NOTE — Telephone Encounter (Signed)
Pt saw Dr Marletta Lor on 8/11 and she said her prescriptions haven't been called into [facility] yet. Please advise. [phone] or [phone]

## 2020-06-23 ENCOUNTER — Other Ambulatory Visit: Payer: Self-pay | Admitting: Internal Medicine

## 2020-06-23 DIAGNOSIS — R1013 Epigastric pain: Secondary | ICD-10-CM

## 2020-06-23 MED ORDER — ALUM & MAG HYDROXIDE-SIMETH 200-200-20 MG/5ML PO SUSP: 15.0000 mL | Freq: Four times a day (QID) | ORAL | Status: DC | PRN

## 2020-06-23 MED ORDER — LIDOCAINE VISCOUS HCL 2 % MT SOLN: 15.0000 mL | Freq: Four times a day (QID) | OROMUCOSAL | 1 refills | Status: DC | PRN

## 2020-06-23 NOTE — Telephone Encounter (Signed)
I called [facility] and they stated they did have the rx's. I called the pt- Yesenia Boyd that rx was sent in and I called the pharmacy to confirm they had it. Asked her to call back if she had any questions.

## 2020-06-23 NOTE — Telephone Encounter (Signed)
-----   Message from Lanelle Bal, DO sent at 06/23/2020  2:49 PM EDT ----- Sent to pharmacy. Please let her know this is a specialty cocktail that has to be mixed and I could not figure out how to send it properly in our system. Please apologize for me. I would be happy to call her and discuss if she wants to talk about it. Thank you! ----- Message ----- From: Myra Rude, LPN Sent: 07/22/568   1:59 PM EDT To: Lanelle Bal, DO  I sent you a phone note about this pt wanting the GI cocktail sent to her pharmacy. She is not happy. Please send it in today.

## 2020-06-23 NOTE — Telephone Encounter (Signed)
Dr.Carver, can you send in rx for the pt? Looks like you were going to send in a GI cocktail.

## 2020-06-25 ENCOUNTER — Ambulatory Visit: Payer: Medicare Other | Admitting: Cardiovascular Disease

## 2020-07-01 ENCOUNTER — Encounter: Payer: Self-pay | Admitting: Cardiology

## 2020-07-01 ENCOUNTER — Other Ambulatory Visit: Payer: Self-pay

## 2020-07-01 ENCOUNTER — Ambulatory Visit (INDEPENDENT_AMBULATORY_CARE_PROVIDER_SITE_OTHER): Payer: BC Managed Care – PPO | Admitting: Cardiology

## 2020-07-01 VITALS — BP 116/72 | HR 82 | Ht 63.0 in | Wt 165.4 lb

## 2020-07-01 DIAGNOSIS — Z87898 Personal history of other specified conditions: Secondary | ICD-10-CM | POA: Diagnosis not present

## 2020-07-01 DIAGNOSIS — I208 Other forms of angina pectoris: Secondary | ICD-10-CM

## 2020-07-01 NOTE — Progress Notes (Signed)
Cardiology Office Note  Date: 07/01/2020   ID: Seidy, Labreck Jun 20, 1956, MRN [id number]  PCP:  Toma Deiters, MD  Cardiologist:  Nona Dell, MD Electrophysiologist:  None   Chief Complaint  Patient presents with  . Cardiac follow-up    History of Present Illness: Yesenia Boyd is a 64 y.o. female former patient of Dr. Purvis Sheffield, now presenting to establish follow-up with me.  I reviewed her records and updated the chart.  She was seen with history of reported syncope and also palpitations, not necessarily associated with each other.  I discussed the symptoms with her today.  She states that the prior events were not necessarily syncope but more of a brief sense of inattention or lack of awareness, no definite trigger, sometimes associated with a headache.  No obvious seizure-like activity or falls.  Cardiac testing from March is outlined below, reassuring Myoview and cardiac monitor.  I discussed the results with her today.  Past Medical History:  Diagnosis Date  . Asthma   . Complication of anesthesia    Deep depression following administration of propofol  . Depression   . Fibromyalgia   . GERD (gastroesophageal reflux disease)   . History of hiatal hernia    2 surgeries, Lap. Nissen Fundiplication   . IBS (irritable bowel syndrome)   . Myasthenia gravis   . Peripheral neuropathy   . S/P colonoscopy    Dr. Linna Darner, internal hemorrhoids, repeat in Sept 2022  . S/P endoscopy    Dr. Linna Darner 2008: removal of impacted food bolus, Dr. Linna Darner 2010: moderate gastritis, Sept 2012 with SLF: path with mild gastritis, no definite stricture noted, dilation with Savary 16 mm  . Seizures (HCC)   . Sleep apnea   . TIA (transient ischemic attack) 2012    Past Surgical History:  Procedure Laterality Date  . ABDOMINAL HYSTERECTOMY     complete per patient  . BLADDER SURGERY     stretch bladder opening  . CHOLECYSTECTOMY    . COLONOSCOPY  07/18/2011   SLF: 1. internal  hemorrhoids  . COLONOSCOPY WITH PROPOFOL N/A 03/21/2017   Dr. Darrick Penna: Redundant left colon, hemorrhoids, next colonoscopy in 10 years.  Patient reports deep pression after propofol.  . ESOPHAGEAL MANOMETRY N/A 01/21/2013  . esophageal surgery?     2008 [facility]  . ESOPHAGOGASTRODUODENOSCOPY  07/18/2011   SLF: 1. Moderate gastritis 2. Dysphagia most likely 2o large food bolus moving through her Nissen, which appears to be intact. Pt has poor denttition and NL BPE 2 years ago. 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  . ESOPHAGOGASTRODUODENOSCOPY (EGD) WITH PROPOFOL N/A 08/21/2018   dr. Darrick Penna: mild gastritis, esophagus appeared normal. s/p dilation  . EVALUATION UNDER ANESTHESIA WITH HEMORRHOIDECTOMY N/A 03/27/2015   Procedure: EXAM UNDER ANESTHESIA WITH SPHINCTEROTOMY ;  Surgeon: Luretha Murphy, MD;  Location: [facility];  Service: General;  Laterality: N/A;  . FLEXIBLE SIGMOIDOSCOPY N/A 12/09/2014   SLF: Rectal pain most likely due to internal and external hemorroids  . HAND SURGERY     carpal tumnnel right and removal of cyst left  . HEMORRHOID BANDING N/A 12/09/2014   Procedure: HEMORRHOID BANDING;  Surgeon: West Bali, MD;  Location: [facility];  Service: Endoscopy;  Laterality: N/A;  . LAPAROSCOPIC NISSEN FUNDOPLICATION N/A 02/13/2013   Procedure: Redo LAPAROSCOPIC NISSEN FUNDOPLICATION;  Surgeon: Valarie Merino, MD;  Location: [facility];  Service: General;  Laterality: N/A;  Forgut explortation with partial  takedown nissan funliplication from 1994 for wrap torsion and persistant dysphagia  . NASAL SEPTOPLASTY W/ TURBINOPLASTY Bilateral 08/03/2016   Procedure: NASAL SEPTOPLASTY WITH TURBINATE REDUCTION;  Surgeon: Newman Pies, MD;  Location: [facility];  Service: ENT;  Laterality: Bilateral;  . NECK SURGERY     removal of "knot" from neck  . NISSEN FUNDOPLICATION     initially in 1994. redo in 2014  . SAVORY DILATION  07/18/2011   Surgeon:Sandi M Fields  . SAVORY DILATION   08/21/2018   Procedure: SAVORY DILATION;  Surgeon: West Bali, MD;  [facility];  Service: Endoscopy;;    Current Outpatient Medications  Medication Sig Dispense Refill  . acetaminophen (TYLENOL) 650 MG CR tablet Take 1,300 mg by mouth every 8 (eight) hours as needed for pain.     Marland Kitchen albuterol (PROVENTIL HFA;VENTOLIN HFA) 108 (90 Base) MCG/ACT inhaler Inhale 1-2 puffs into the lungs every 6 (six) hours as needed for wheezing or shortness of breath.     Marland Kitchen alum & mag hydroxide-simeth (MYLANTA) 200-200-20 MG/5ML suspension Take 10 mLs by mouth 2 (two) times daily.     Marland Kitchen CARAFATE 1 GM/10ML suspension Take 3 g by mouth as needed (heartburn, reflux).     . Cholecalciferol (CVS VIT D 5000 HIGH-POTENCY) 5000 UNITS capsule Take 10,000 Units by mouth every morning.     . Coenzyme Q10 (COQ10 PO) Take 10 mLs by mouth daily.     . cyanocobalamin (,VITAMIN B-12,) 1000 MCG/ML injection Inject 1,000 mcg into the muscle every 30 (thirty) days.    Marland Kitchen EPINEPHrine 0.3 mg/0.3 mL IJ SOAJ injection Inject 0.3 mg into the muscle as needed (allergic reaction).    Marland Kitchen esomeprazole (NEXIUM) 40 MG capsule TAKE ONE CAPSULE BY MOUTH TWICE DAILY BEFORE A MEAL 60 capsule 11  . estrogens, conjugated, (PREMARIN) 0.625 MG tablet Take 0.625 mg by mouth at bedtime.     . Glucosamine-Chondroitin (MOVE FREE PO) Take 1 tablet by mouth daily.    . hydrOXYzine (VISTARIL) 25 MG capsule Take 25 mg by mouth at bedtime.     Marland Kitchen ketoconazole (NIZORAL) 2 % cream Apply 1 application topically 2 (two) times daily.    Marland Kitchen L-Methylfolate-B6-B12 (FOLTANX) 3-35-2 MG TABS Take 1 tablet by mouth daily.  0  . lidocaine (XYLOCAINE) 2 % solution Use as directed 15 mLs in the mouth or throat every 6 (six) hours as needed (Indegestion). 300 mL 1  . loratadine (CLARITIN) 10 MG tablet Take 10 mg by mouth daily.     . montelukast (SINGULAIR) 10 MG tablet Take 10 mg by mouth at bedtime.    . Multiple Vitamins-Minerals (WOMENS MULTIVITAMIN PO) Take  by mouth daily.    . nitroGLYCERIN (NITROSTAT) 0.4 MG SL tablet Place 0.4 mg under the tongue every 5 (five) minutes as needed for chest pain.    . rizatriptan (MAXALT) 10 MG tablet Take 10 mg by mouth as needed for migraine.    . simethicone (MYLICON) 125 MG chewable tablet Chew 125 mg by mouth every 6 (six) hours as needed for flatulence.    Marland Kitchen tiZANidine (ZANAFLEX) 4 MG tablet Take 6 mg by mouth at bedtime.     . traZODone (DESYREL) 50 MG tablet Take 50 mg by mouth at bedtime as needed.     . vitamin E 1000 UNIT capsule Take 1,000 Units by mouth at bedtime.      No current facility-administered medications for this visit.   Allergies:  Aspirin, Avelox [moxifloxacin hcl  in nacl], Linzess [linaclotide], Penicillins, Adhesive [tape], Carbamazepine, Cephalexin, Clindamycin/lincomycin, Codeine, Cymbalta [duloxetine hcl], Darvon [propoxyphene], Dicyclomine, Hydrocodone-acetaminophen, Ketek [telithromycin], Latex, Macrolides and ketolides, Meperidine hcl, Metronidazole, Morphine, Mupirocin, Pepcid [famotidine], Prednisone, Propofol, Protonix [pantoprazole], Solodyn [minocycline hcl er], Sulfonamide derivatives, Tramadol hcl, and Zylet [loteprednol-tobramycin]   ROS:   No frank syncope.  Physical Exam: VS:  BP 116/72   Pulse 82   Ht 5\' 3"  (1.6 m)   Wt 165 lb 6.4 oz (75 kg)   SpO2 98%   BMI 29.30 kg/m , BMI Body mass index is 29.3 kg/m.  Wt Readings from Last 3 Encounters:  07/01/20 165 lb 6.4 oz (75 kg)  06/17/20 166 lb 6.4 oz (75.5 kg)  01/06/20 162 lb 12.8 oz (73.8 kg)    General: Patient appears comfortable at rest. HEENT: Conjunctiva and lids normal, wearing a mask. Cardiac: Regular rate and rhythm, no S3 or significant systolic murmur, no pericardial rub. Extremities: No pitting edema, distal pulses 2+.  ECG:  An ECG dated 01/06/2020 was personally reviewed today and demonstrated:  Sinus rhythm with small R prime in lead V1 and V2.  Recent Labwork:  No interval lab work for review  today.  Other Studies Reviewed Today:  Cardiac monitor 01/16/2020:  Predominantly sinus rhythm with isolated episodes of sinus tachycardia.  Symptoms primarily correlated with sinus rhythm.  There were no sustained arrhythmias.  Lexiscan Myoview 01/15/2020:  There was no ST segment deviation noted during stress.  The study is normal. No ischemia or scar.  This is a low risk study.  Nuclear stress EF: 61%.  Assessment and Plan:  History of spells as discussed above, not necessarily frank syncope or falls, no definite seizure-like activity.  Palpitations not always associated with these events.  Cardiac monitor did not demonstrate any arrhythmias in association with reported symptoms and Lexiscan Myoview was also low risk without evidence of scar or ischemia and normal LVEF.  At this point do not anticipate further cardiac testing.  May want to consider referral to a neurologist if the symptoms persist and in particular if they are associated with headaches.  Keep follow-up with Dr. 03/16/2020.   Medication Adjustments/Labs and Tests Ordered: Current medicines are reviewed at length with the patient today.  Concerns regarding medicines are outlined above.   Tests Ordered: No orders of the defined types were placed in this encounter.   Medication Changes: No orders of the defined types were placed in this encounter.   Disposition:  Follow up prn  Signed, Olena Leatherwood, MD, [facility] 07/01/2020 4:06 PM    [facility] [address] [place], [place], [place] Phone: [phone]; Fax: [phone]

## 2020-07-01 NOTE — Patient Instructions (Addendum)
Medication Instructions:   Your physician recommends that you continue on your current medications as directed. Please refer to the Current Medication list given to you today.  Labwork:  NONE  Testing/Procedures:  NONE  Follow-Up:  Your physician recommends that you schedule a follow-up appointment in: as needed.   Any Other Special Instructions Will Be Listed Below (If Applicable).  If you need a refill on your cardiac medications before your next appointment, please call your pharmacy. 

## 2020-08-10 ENCOUNTER — Encounter: Payer: Self-pay | Admitting: Internal Medicine

## 2020-09-03 ENCOUNTER — Telehealth: Payer: Self-pay

## 2020-09-03 NOTE — Telephone Encounter (Signed)
Pt. Called and is complaining of abd pain, nausea x 1 month. Pt has a upcoming appt 09/17/2020. Please advise

## 2020-09-07 ENCOUNTER — Telehealth: Payer: Self-pay | Admitting: Internal Medicine

## 2020-09-07 NOTE — Telephone Encounter (Signed)
Patient has a complicated past GI and surgical history.  I have not seen her since August at which time I started her on GI cocktails and have not heard from her since.  She has chronic abdominal pain at baseline on a daily basis as well as nausea.  Can we see if we can get her in sooner with either myself or one of the APPs to discuss this acute change?  Okay to send in Zofran as needed for nausea in the meantime.  Thank you

## 2020-09-07 NOTE — Telephone Encounter (Signed)
Patient called very upset that no one has returned her call since last week.  Asked what has happened that we don't call patient's back when they are in pain.  Said she was going to have to talk to "someone over [place]"  Patient called last Thursday and note is in [employer]

## 2020-09-07 NOTE — Telephone Encounter (Signed)
Routing to Dr. Marletta Lor for advice prior to me calling the patient

## 2020-09-08 NOTE — Telephone Encounter (Signed)
I spoke with the patient and she stated she will wait to be seen next week.  Her pcp sent her a Rx in for her nausea.

## 2020-09-10 ENCOUNTER — Ambulatory Visit (INDEPENDENT_AMBULATORY_CARE_PROVIDER_SITE_OTHER): Payer: BC Managed Care – PPO | Admitting: Gastroenterology

## 2020-09-10 ENCOUNTER — Encounter: Payer: Self-pay | Admitting: *Deleted

## 2020-09-10 ENCOUNTER — Other Ambulatory Visit: Payer: Self-pay

## 2020-09-10 ENCOUNTER — Encounter: Payer: Self-pay | Admitting: Gastroenterology

## 2020-09-10 VITALS — BP 124/78 | HR 78 | Temp 97.9°F | Ht 63.0 in | Wt 166.0 lb

## 2020-09-10 DIAGNOSIS — I208 Other forms of angina pectoris: Secondary | ICD-10-CM

## 2020-09-10 DIAGNOSIS — R1013 Epigastric pain: Secondary | ICD-10-CM

## 2020-09-10 DIAGNOSIS — R1319 Other dysphagia: Secondary | ICD-10-CM | POA: Diagnosis not present

## 2020-09-10 MED ORDER — ONDANSETRON HCL 4 MG PO TABS: 4.0000 mg | ORAL_TABLET | Freq: Three times a day (TID) | ORAL | 3 refills | Status: DC | PRN

## 2020-09-10 NOTE — H&P (View-Only) (Signed)
Referring Provider: Toma Deiters, MD Primary Care Physician:  Toma Deiters, MD Primary GI: Dr. Marletta Lor  Chief Complaint  Patient presents with  . Nausea    no vomiting  . Abdominal Pain    upper abd, above naval, comes/goes    HPI:   Yesenia Boyd is a 64 y.o. female presenting today with a complicated past GI history, which includes Nissen X 2, history of dysphagia with empiric dilation in Oct 2019, now returning in close follow-up after seeing Dr. Marletta Lor in August 2021. At that time, she wanted to hold off on dilation. Started on GI cocktail, continue Nexium BID.   Supraumbilical abdominal pain noted, worsened over last 3 weeks. Intermittent. She is unsure if associated with food. Drinking lots of ginger ale, tea with ginger. Feels nauseated. No vomiting. Having some regurgitation of food. Intermittent food dysphagia. Tries to follow a soft diet. Meats still get hung up. Having difficulty sleeping. Wants to pursue EGD now.    Past Medical History:  Diagnosis Date  . Asthma   . Complication of anesthesia    Deep depression following administration of propofol  . Depression   . Fibromyalgia   . GERD (gastroesophageal reflux disease)   . History of hiatal hernia    2 surgeries, Lap. Nissen Fundiplication   . IBS (irritable bowel syndrome)   . Myasthenia gravis   . Peripheral neuropathy   . S/P colonoscopy    Dr. Linna Darner, internal hemorrhoids, repeat in Sept 2022  . S/P endoscopy    Dr. Linna Darner 2008: removal of impacted food bolus, Dr. Linna Darner 2010: moderate gastritis, Sept 2012 with SLF: path with mild gastritis, no definite stricture noted, dilation with Savary 16 mm  . Seizures (HCC)   . Sleep apnea   . TIA (transient ischemic attack) 2012    Past Surgical History:  Procedure Laterality Date  . ABDOMINAL HYSTERECTOMY     complete per patient  . BLADDER SURGERY     stretch bladder opening  . CHOLECYSTECTOMY    . COLONOSCOPY  07/18/2011   SLF: 1. internal  hemorrhoids  . COLONOSCOPY WITH PROPOFOL N/A 03/21/2017   Dr. Darrick Penna: Redundant left colon, hemorrhoids, next colonoscopy in 10 years.  Patient reports deep pression after propofol.  . ESOPHAGEAL MANOMETRY N/A 01/21/2013  . esophageal surgery?     2008 [facility]  . ESOPHAGOGASTRODUODENOSCOPY  07/18/2011   SLF: 1. Moderate gastritis 2. Dysphagia most likely 2o large food bolus moving through her Nissen, which appears to be intact. Pt has poor denttition and NL BPE 2 years ago. 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  . ESOPHAGOGASTRODUODENOSCOPY (EGD) WITH PROPOFOL N/A 08/21/2018   dr. Darrick Penna: mild gastritis, esophagus appeared normal. s/p dilation  . EVALUATION UNDER ANESTHESIA WITH HEMORRHOIDECTOMY N/A 03/27/2015   Procedure: EXAM UNDER ANESTHESIA WITH SPHINCTEROTOMY ;  Surgeon: Luretha Murphy, MD;  Location: [facility];  Service: General;  Laterality: N/A;  . FLEXIBLE SIGMOIDOSCOPY N/A 12/09/2014   SLF: Rectal pain most likely due to internal and external hemorroids  . HAND SURGERY     carpal tumnnel right and removal of cyst left  . HEMORRHOID BANDING N/A 12/09/2014   Procedure: HEMORRHOID BANDING;  Surgeon: West Bali, MD;  Location: [facility];  Service: Endoscopy;  Laterality: N/A;  . LAPAROSCOPIC NISSEN FUNDOPLICATION N/A 02/13/2013   Procedure: Redo LAPAROSCOPIC NISSEN FUNDOPLICATION;  Surgeon: Valarie Merino, MD;  Location: [facility];  Service: General;  Laterality: N/A;  Forgut explortation with partial  takedown nissan funliplication from 1994 for wrap torsion and persistant dysphagia  . NASAL SEPTOPLASTY W/ TURBINOPLASTY Bilateral 08/03/2016   Procedure: NASAL SEPTOPLASTY WITH TURBINATE REDUCTION;  Surgeon: Newman Pies, MD;  Location: [facility];  Service: ENT;  Laterality: Bilateral;  . NECK SURGERY     removal of "knot" from neck  . NISSEN FUNDOPLICATION     initially in 1994. redo in 2014  . SAVORY DILATION  07/18/2011   Surgeon:Sandi M Fields  . SAVORY DILATION   08/21/2018   Procedure: SAVORY DILATION;  Surgeon: West Bali, MD;  [facility]: [facility];  Service: Endoscopy;;    Current Outpatient Medications  Medication Sig Dispense Refill  . acetaminophen (TYLENOL) 650 MG CR tablet Take 1,300 mg by mouth every 8 (eight) hours as needed for pain.     Marland Kitchen albuterol (PROVENTIL HFA;VENTOLIN HFA) 108 (90 Base) MCG/ACT inhaler Inhale 1-2 puffs into the lungs every 6 (six) hours as needed for wheezing or shortness of breath.     Marland Kitchen alum & mag hydroxide-simeth (MYLANTA) 200-200-20 MG/5ML suspension Take 10 mLs by mouth 2 (two) times daily.     Marland Kitchen CARAFATE 1 GM/10ML suspension Take 3 g by mouth as needed (heartburn, reflux).     . Cholecalciferol (CVS VIT D 5000 HIGH-POTENCY) 5000 UNITS capsule Take 10,000 Units by mouth every morning.     . Coenzyme Q10 (COQ10 PO) Take 10 mLs by mouth daily.     . cyanocobalamin (,VITAMIN B-12,) 1000 MCG/ML injection Inject 1,000 mcg into the muscle every 30 (thirty) days.    Marland Kitchen EPINEPHrine 0.3 mg/0.3 mL IJ SOAJ injection Inject 0.3 mg into the muscle as needed (allergic reaction).    Marland Kitchen esomeprazole (NEXIUM) 40 MG capsule TAKE ONE CAPSULE BY MOUTH TWICE DAILY BEFORE A MEAL 60 capsule 11  . estrogens, conjugated, (PREMARIN) 0.625 MG tablet Take 0.625 mg by mouth at bedtime.     . Glucosamine-Chondroitin (MOVE FREE PO) Take 1 tablet by mouth daily.    . hydrOXYzine (VISTARIL) 25 MG capsule Take 25 mg by mouth at bedtime.     Marland Kitchen ketoconazole (NIZORAL) 2 % cream Apply 1 application topically 2 (two) times daily.    Marland Kitchen L-Methylfolate-B6-B12 (FOLTANX) 3-35-2 MG TABS Take 1 tablet by mouth daily.  0  . loratadine (CLARITIN) 10 MG tablet Take 10 mg by mouth daily.     . montelukast (SINGULAIR) 10 MG tablet Take 10 mg by mouth at bedtime.    . Multiple Vitamins-Minerals (WOMENS MULTIVITAMIN PO) Take by mouth daily.    . nitroGLYCERIN (NITROSTAT) 0.4 MG SL tablet Place 0.4 mg under the tongue every 5 (five) minutes as needed for chest  pain.    Marland Kitchen ondansetron (ZOFRAN) 4 MG tablet Take 4 mg by mouth every 8 (eight) hours as needed for nausea or vomiting.    . rizatriptan (MAXALT) 10 MG tablet Take 10 mg by mouth as needed for migraine.    . simethicone (MYLICON) 125 MG chewable tablet Chew 125 mg by mouth every 6 (six) hours as needed for flatulence.    Marland Kitchen tiZANidine (ZANAFLEX) 4 MG tablet Take 6 mg by mouth at bedtime.     . traZODone (DESYREL) 50 MG tablet Take 50 mg by mouth at bedtime as needed.     . vitamin E 1000 UNIT capsule Take 1,000 Units by mouth at bedtime.      No current facility-administered medications for this visit.    Allergies as of  09/10/2020 - Review Complete 07/01/2020  Allergen Reaction Noted  . Aspirin Other (See Comments) 07/28/2016  . Avelox [moxifloxacin hcl in nacl] Anaphylaxis and Itching 06/20/2011  . Linzess [linaclotide] Swelling 02/04/2013  . Penicillins Anaphylaxis and Hives   . Adhesive [tape] Other (See Comments) 01/24/2013  . Carbamazepine Other (See Comments) 03/16/2015  . Cephalexin Hives and Itching   . Clindamycin/lincomycin Hives and Itching 12/17/2018  . Codeine Hives and Itching   . Cymbalta [duloxetine hcl] Other (See Comments) 06/20/2011  . Darvon [propoxyphene] Hives 03/17/2015  . Dicyclomine Itching and Other (See Comments) 06/20/2011  . Hydrocodone-acetaminophen Itching 03/15/2017  . Ketek [telithromycin]  09/06/2017  . Latex Itching 01/24/2013  . Macrolides and ketolides Swelling 07/18/2011  . Meperidine hcl Itching and Other (See Comments)   . Metronidazole Hives and Other (See Comments) 06/20/2011  . Morphine Itching and Other (See Comments)   . Mupirocin Itching 12/17/2018  . Pepcid [famotidine] Nausea And Vomiting 03/16/2015  . Prednisone Hives, Swelling, and Other (See Comments) 06/20/2011  . Propofol Other (See Comments) 03/17/2015  . Protonix [pantoprazole] Swelling 03/17/2015  . Solodyn [minocycline hcl er]  11/21/2019  . Sulfonamide derivatives Hives     . Tramadol hcl Hives and Other (See Comments)   . Zylet [loteprednol-tobramycin] Hives and Swelling 03/15/2017    Family History  Problem Relation Age of Onset  . Anesthesia problems Sister   . Cancer Sister        breast  . Hypertension Sister   . Parkinson's disease Mother   . Kidney disease Mother   . Diabetes Mother   . Heart attack Father   . Cancer Father        stomach cancer  . Hypertension Brother   . Diabetes Brother   . Arthritis Brother   . Kidney disease Brother   . Parkinson's disease Brother   . Cancer Maternal Aunt        breast  . Cancer Maternal Grandmother        breast  . Multiple sclerosis Daughter   . Colon cancer Cousin        4 cousins  . Pseudochol deficiency Neg Hx   . Hypotension Neg Hx   . Malignant hyperthermia Neg Hx     Social History   Socioeconomic History  . Marital status: Married    Spouse name: Not on file  . Number of children: Not on file  . Years of education: Not on file  . Highest education level: Not on file  Occupational History  . Not on file  Tobacco Use  . Smoking status: Never Smoker  . Smokeless tobacco: Never Used  Vaping Use  . Vaping Use: Never used  Substance and Sexual Activity  . Alcohol use: Yes    Comment: drinks wine "every blue moon"  . Drug use: No  . Sexual activity: Not on file  Other Topics Concern  . Not on file  Social History Narrative  . Not on file   Social Determinants of Health   Financial Resource Strain:   . Difficulty of Paying Living Expenses: Not on file  Food Insecurity:   . Worried About Programme researcher, broadcasting/film/video in the Last Year: Not on file  . Ran Out of Food in the Last Year: Not on file  Transportation Needs:   . Lack of Transportation (Medical): Not on file  . Lack of Transportation (Non-Medical): Not on file  Physical Activity:   . Days of Exercise per Week: Not on  file  . Minutes of Exercise per Session: Not on file  Stress:   . Feeling of Stress : Not on file   Social Connections:   . Frequency of Communication with Friends and Family: Not on file  . Frequency of Social Gatherings with Friends and Family: Not on file  . Attends Religious Services: Not on file  . Active Member of Clubs or Organizations: Not on file  . Attends Club or Organization Meetings: Not on file  . Marital Status: Not on file    Review of Systems: Gen: Denies fever, chills, anorexia. Denies fatigue, weakness, weight loss.  CV: Denies chest pain, palpitations, syncope, peripheral edema, and claudication. Resp: Denies dyspnea at rest, cough, wheezing, coughing up blood, and pleurisy. GI: see HPI Derm: Denies rash, itching, dry skin Psych: Denies depression, anxiety, memory loss, confusion. No homicidal or suicidal ideation.  Heme: Denies bruising, bleeding, and enlarged lymph nodes.  Physical Exam: BP 124/78   Pulse 78   Temp 97.9 F (36.6 C)   Ht 5' 3" (1.6 m)   Wt 166 lb (75.3 kg)   BMI 29.41 kg/m  General:   Alert and oriented. No distress noted. Pleasant and cooperative.  Head:  Normocephalic and atraumatic. Eyes:  Conjuctiva clear without scleral icterus. Mouth:  Mask in place Cardiac: S1 S2 present without murmurs Lungs: clear bilaterally Abdomen:  +BS, soft, mild TTP supraumbilically and non-distended. No rebound or guarding. No HSM or masses noted. Msk:  Symmetrical without gross deformities. Normal posture. Extremities:  Without edema. Neurologic:  Alert and  oriented x4 Psych:  Alert and cooperative. Normal mood and affect.  ASSESSMENT: Yesenia Boyd is a 64 y.o. female presenting today with worsening dyspepsia and dysphagia, recently seen August 2021, but she had elected to hold off on EGD/dilation at that time. She is now ready to proceed due to continued symptoms.  Denying any NSAIDs. Continues on Nexium BID. GI cocktail without much relief. Gallbladder absent. Empiric dilation last in 2019.  As of note, allergy list includes PROPOFOL; however,  she noted "depression" after Propofol and tolerated this without issues in 2019.    PLAN:  Continue Nexium BID  Proceed with upper endoscopy/dilation by Dr. Carver in near future: the risks, benefits, and alternatives have been discussed with the patient in detail. The patient states understanding and desires to proceed.   Further recommendations to follow  Chella Chapdelaine W. Ruhan Borak, PhD, ANP-BC [facility]    

## 2020-09-10 NOTE — Patient Instructions (Signed)
We are arranging an upper endoscopy with dilation by Dr. Marletta Lor in the near future.  Have a wonderful birthday!!!!!!    I enjoyed seeing you again today! As you know, I value our relationship and want to provide genuine, compassionate, and quality care. I welcome your feedback. If you receive a survey regarding your visit,  I greatly appreciate you taking time to fill this out. See you next time!  Gelene Mink, PhD, ANP-BC [facility]

## 2020-09-10 NOTE — Progress Notes (Signed)
Referring Provider: Toma Deiters, MD Primary Care Physician:  Toma Deiters, MD Primary GI: Dr. Marletta Lor  Chief Complaint  Patient presents with  . Nausea    no vomiting  . Abdominal Pain    upper abd, above naval, comes/goes    HPI:   Yesenia Boyd is a 64 y.o. female presenting today with a complicated past GI history, which includes Nissen X 2, history of dysphagia with empiric dilation in Oct 2019, now returning in close follow-up after seeing Dr. Marletta Lor in August 2021. At that time, she wanted to hold off on dilation. Started on GI cocktail, continue Nexium BID.   Supraumbilical abdominal pain noted, worsened over last 3 weeks. Intermittent. She is unsure if associated with food. Drinking lots of ginger ale, tea with ginger. Feels nauseated. No vomiting. Having some regurgitation of food. Intermittent food dysphagia. Tries to follow a soft diet. Meats still get hung up. Having difficulty sleeping. Wants to pursue EGD now.    Past Medical History:  Diagnosis Date  . Asthma   . Complication of anesthesia    Deep depression following administration of propofol  . Depression   . Fibromyalgia   . GERD (gastroesophageal reflux disease)   . History of hiatal hernia    2 surgeries, Lap. Nissen Fundiplication   . IBS (irritable bowel syndrome)   . Myasthenia gravis   . Peripheral neuropathy   . S/P colonoscopy    Dr. Linna Darner, internal hemorrhoids, repeat in Sept 2022  . S/P endoscopy    Dr. Linna Darner 2008: removal of impacted food bolus, Dr. Linna Darner 2010: moderate gastritis, Sept 2012 with SLF: path with mild gastritis, no definite stricture noted, dilation with Savary 16 mm  . Seizures (HCC)   . Sleep apnea   . TIA (transient ischemic attack) 2012    Past Surgical History:  Procedure Laterality Date  . ABDOMINAL HYSTERECTOMY     complete per patient  . BLADDER SURGERY     stretch bladder opening  . CHOLECYSTECTOMY    . COLONOSCOPY  07/18/2011   SLF: 1. internal  hemorrhoids  . COLONOSCOPY WITH PROPOFOL N/A 03/21/2017   Dr. Darrick Penna: Redundant left colon, hemorrhoids, next colonoscopy in 10 years.  Patient reports deep pression after propofol.  . ESOPHAGEAL MANOMETRY N/A 01/21/2013  . esophageal surgery?     2008 [facility]  . ESOPHAGOGASTRODUODENOSCOPY  07/18/2011   SLF: 1. Moderate gastritis 2. Dysphagia most likely 2o large food bolus moving through her Nissen, which appears to be intact. Pt has poor denttition and NL BPE 2 years ago. 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  . ESOPHAGOGASTRODUODENOSCOPY (EGD) WITH PROPOFOL N/A 08/21/2018   dr. Darrick Penna: mild gastritis, esophagus appeared normal. s/p dilation  . EVALUATION UNDER ANESTHESIA WITH HEMORRHOIDECTOMY N/A 03/27/2015   Procedure: EXAM UNDER ANESTHESIA WITH SPHINCTEROTOMY ;  Surgeon: Luretha Murphy, MD;  Location: [facility];  Service: General;  Laterality: N/A;  . FLEXIBLE SIGMOIDOSCOPY N/A 12/09/2014   SLF: Rectal pain most likely due to internal and external hemorroids  . HAND SURGERY     carpal tumnnel right and removal of cyst left  . HEMORRHOID BANDING N/A 12/09/2014   Procedure: HEMORRHOID BANDING;  Surgeon: West Bali, MD;  Location: [facility];  Service: Endoscopy;  Laterality: N/A;  . LAPAROSCOPIC NISSEN FUNDOPLICATION N/A 02/13/2013   Procedure: Redo LAPAROSCOPIC NISSEN FUNDOPLICATION;  Surgeon: Valarie Merino, MD;  Location: [facility];  Service: General;  Laterality: N/A;  Forgut explortation with partial  takedown nissan funliplication from 1994 for wrap torsion and persistant dysphagia  . NASAL SEPTOPLASTY W/ TURBINOPLASTY Bilateral 08/03/2016   Procedure: NASAL SEPTOPLASTY WITH TURBINATE REDUCTION;  Surgeon: Newman Pies, MD;  Location: [facility];  Service: ENT;  Laterality: Bilateral;  . NECK SURGERY     removal of "knot" from neck  . NISSEN FUNDOPLICATION     initially in 1994. redo in 2014  . SAVORY DILATION  07/18/2011   Surgeon:Sandi M Fields  . SAVORY DILATION   08/21/2018   Procedure: SAVORY DILATION;  Surgeon: West Bali, MD;  Location: [facility];  Service: Endoscopy;;    Current Outpatient Medications  Medication Sig Dispense Refill  . acetaminophen (TYLENOL) 650 MG CR tablet Take 1,300 mg by mouth every 8 (eight) hours as needed for pain.     Marland Kitchen albuterol (PROVENTIL HFA;VENTOLIN HFA) 108 (90 Base) MCG/ACT inhaler Inhale 1-2 puffs into the lungs every 6 (six) hours as needed for wheezing or shortness of breath.     Marland Kitchen alum & mag hydroxide-simeth (MYLANTA) 200-200-20 MG/5ML suspension Take 10 mLs by mouth 2 (two) times daily.     Marland Kitchen CARAFATE 1 GM/10ML suspension Take 3 g by mouth as needed (heartburn, reflux).     . Cholecalciferol ([employer] VIT D 5000 HIGH-POTENCY) 5000 UNITS capsule Take 10,000 Units by mouth every morning.     . Coenzyme Q10 (COQ10 PO) Take 10 mLs by mouth daily.     . cyanocobalamin (,VITAMIN B-12,) 1000 MCG/ML injection Inject 1,000 mcg into the muscle every 30 (thirty) days.    Marland Kitchen EPINEPHrine 0.3 mg/0.3 mL IJ SOAJ injection Inject 0.3 mg into the muscle as needed (allergic reaction).    Marland Kitchen esomeprazole (NEXIUM) 40 MG capsule TAKE ONE CAPSULE BY MOUTH TWICE DAILY BEFORE A MEAL 60 capsule 11  . estrogens, conjugated, (PREMARIN) 0.625 MG tablet Take 0.625 mg by mouth at bedtime.     . Glucosamine-Chondroitin (MOVE FREE PO) Take 1 tablet by mouth daily.    . hydrOXYzine (VISTARIL) 25 MG capsule Take 25 mg by mouth at bedtime.     Marland Kitchen ketoconazole (NIZORAL) 2 % cream Apply 1 application topically 2 (two) times daily.    Marland Kitchen L-Methylfolate-B6-B12 (FOLTANX) 3-35-2 MG TABS Take 1 tablet by mouth daily.  0  . loratadine (CLARITIN) 10 MG tablet Take 10 mg by mouth daily.     . montelukast (SINGULAIR) 10 MG tablet Take 10 mg by mouth at bedtime.    . Multiple Vitamins-Minerals (WOMENS MULTIVITAMIN PO) Take by mouth daily.    . nitroGLYCERIN (NITROSTAT) 0.4 MG SL tablet Place 0.4 mg under the tongue every 5 (five) minutes as needed for chest  pain.    Marland Kitchen ondansetron (ZOFRAN) 4 MG tablet Take 4 mg by mouth every 8 (eight) hours as needed for nausea or vomiting.    . rizatriptan (MAXALT) 10 MG tablet Take 10 mg by mouth as needed for migraine.    . simethicone (MYLICON) 125 MG chewable tablet Chew 125 mg by mouth every 6 (six) hours as needed for flatulence.    Marland Kitchen tiZANidine (ZANAFLEX) 4 MG tablet Take 6 mg by mouth at bedtime.     . traZODone (DESYREL) 50 MG tablet Take 50 mg by mouth at bedtime as needed.     . vitamin E 1000 UNIT capsule Take 1,000 Units by mouth at bedtime.      No current facility-administered medications for this visit.    Allergies as of  09/10/2020 - Review Complete 07/01/2020  Allergen Reaction Noted  . Aspirin Other (See Comments) 07/28/2016  . Avelox [moxifloxacin hcl in nacl] Anaphylaxis and Itching 06/20/2011  . Linzess [linaclotide] Swelling 02/04/2013  . Penicillins Anaphylaxis and Hives   . Adhesive [tape] Other (See Comments) 01/24/2013  . Carbamazepine Other (See Comments) 03/16/2015  . Cephalexin Hives and Itching   . Clindamycin/lincomycin Hives and Itching 12/17/2018  . Codeine Hives and Itching   . Cymbalta [duloxetine hcl] Other (See Comments) 06/20/2011  . Darvon [propoxyphene] Hives 03/17/2015  . Dicyclomine Itching and Other (See Comments) 06/20/2011  . Hydrocodone-acetaminophen Itching 03/15/2017  . Ketek [telithromycin]  09/06/2017  . Latex Itching 01/24/2013  . Macrolides and ketolides Swelling 07/18/2011  . Meperidine hcl Itching and Other (See Comments)   . Metronidazole Hives and Other (See Comments) 06/20/2011  . Morphine Itching and Other (See Comments)   . Mupirocin Itching 12/17/2018  . Pepcid [famotidine] Nausea And Vomiting 03/16/2015  . Prednisone Hives, Swelling, and Other (See Comments) 06/20/2011  . Propofol Other (See Comments) 03/17/2015  . Protonix [pantoprazole] Swelling 03/17/2015  . Solodyn [minocycline hcl er]  11/21/2019  . Sulfonamide derivatives Hives     . Tramadol hcl Hives and Other (See Comments)   . Zylet [loteprednol-tobramycin] Hives and Swelling 03/15/2017    Family History  Problem Relation Age of Onset  . Anesthesia problems Sister   . Cancer Sister        breast  . Hypertension Sister   . Parkinson's disease Mother   . Kidney disease Mother   . Diabetes Mother   . Heart attack Father   . Cancer Father        stomach cancer  . Hypertension Brother   . Diabetes Brother   . Arthritis Brother   . Kidney disease Brother   . Parkinson's disease Brother   . Cancer Maternal Aunt        breast  . Cancer Maternal Grandmother        breast  . Multiple sclerosis Daughter   . Colon cancer Cousin        4 cousins  . Pseudochol deficiency Neg Hx   . Hypotension Neg Hx   . Malignant hyperthermia Neg Hx     Social History   Socioeconomic History  . Marital status: Married    Spouse name: Not on file  . Number of children: Not on file  . Years of education: Not on file  . Highest education level: Not on file  Occupational History  . Not on file  Tobacco Use  . Smoking status: Never Smoker  . Smokeless tobacco: Never Used  Vaping Use  . Vaping Use: Never used  Substance and Sexual Activity  . Alcohol use: Yes    Comment: drinks wine "every blue moon"  . Drug use: No  . Sexual activity: Not on file  Other Topics Concern  . Not on file  Social History Narrative  . Not on file   Social Determinants of Health   Financial Resource Strain:   . Difficulty of Paying Living Expenses: Not on file  Food Insecurity:   . Worried About Programme researcher, broadcasting/film/video in the Last Year: Not on file  . Ran Out of Food in the Last Year: Not on file  Transportation Needs:   . Lack of Transportation (Medical): Not on file  . Lack of Transportation (Non-Medical): Not on file  Physical Activity:   . Days of Exercise per Week: Not on  file  . Minutes of Exercise per Session: Not on file  Stress:   . Feeling of Stress : Not on file   Social Connections:   . Frequency of Communication with Friends and Family: Not on file  . Frequency of Social Gatherings with Friends and Family: Not on file  . Attends Religious Services: Not on file  . Active Member of Clubs or Organizations: Not on file  . Attends BankerClub or Organization Meetings: Not on file  . Marital Status: Not on file    Review of Systems: Gen: Denies fever, chills, anorexia. Denies fatigue, weakness, weight loss.  CV: Denies chest pain, palpitations, syncope, peripheral edema, and claudication. Resp: Denies dyspnea at rest, cough, wheezing, coughing up blood, and pleurisy. GI: see HPI Derm: Denies rash, itching, dry skin Psych: Denies depression, anxiety, memory loss, confusion. No homicidal or suicidal ideation.  Heme: Denies bruising, bleeding, and enlarged lymph nodes.  Physical Exam: BP 124/78   Pulse 78   Temp 97.9 F (36.6 C)   Ht 5\' 3"  (1.6 m)   Wt 166 lb (75.3 kg)   BMI 29.41 kg/m  General:   Alert and oriented. No distress noted. Pleasant and cooperative.  Head:  Normocephalic and atraumatic. Eyes:  Conjuctiva clear without scleral icterus. Mouth:  Mask in place Cardiac: S1 S2 present without murmurs Lungs: clear bilaterally Abdomen:  +BS, soft, mild TTP supraumbilically and non-distended. No rebound or guarding. No HSM or masses noted. Msk:  Symmetrical without gross deformities. Normal posture. Extremities:  Without edema. Neurologic:  Alert and  oriented x4 Psych:  Alert and cooperative. Normal mood and affect.  ASSESSMENT: Yesenia Boyd is a 64 y.o. female presenting today with worsening dyspepsia and dysphagia, recently seen August 2021, but she had elected to hold off on EGD/dilation at that time. She is now ready to proceed due to continued symptoms.  Denying any NSAIDs. Continues on Nexium BID. GI cocktail without much relief. Gallbladder absent. Empiric dilation last in 2019.  As of note, allergy list includes PROPOFOL; however,  she noted "depression" after Propofol and tolerated this without issues in 2019.    PLAN:  Continue Nexium BID  Proceed with upper endoscopy/dilation by Dr. Marletta Lorarver in near future: the risks, benefits, and alternatives have been discussed with the patient in detail. The patient states understanding and desires to proceed.   Further recommendations to follow  Gelene MinkAnna W. Emberley Kral, PhD, ANP-BC [facility]

## 2020-09-17 ENCOUNTER — Ambulatory Visit: Payer: BC Managed Care – PPO | Admitting: Internal Medicine

## 2020-09-30 ENCOUNTER — Other Ambulatory Visit (HOSPITAL_COMMUNITY)
Admission: RE | Admit: 2020-09-30 | Discharge: 2020-09-30 | Disposition: A | Discharge status: Home / Self Care | Payer: BC Managed Care – PPO | Source: Ambulatory Visit | Attending: Internal Medicine | Admitting: Internal Medicine

## 2020-09-30 ENCOUNTER — Other Ambulatory Visit: Payer: Self-pay

## 2020-09-30 DIAGNOSIS — Z20822 Contact with and (suspected) exposure to covid-19: Secondary | ICD-10-CM | POA: Insufficient documentation

## 2020-09-30 DIAGNOSIS — Z01812 Encounter for preprocedural laboratory examination: Secondary | ICD-10-CM | POA: Insufficient documentation

## 2020-10-01 LAB — SARS CORONAVIRUS 2 (TAT 6-24 HRS): SARS Coronavirus 2: NEGATIVE

## 2020-10-05 ENCOUNTER — Other Ambulatory Visit: Payer: Self-pay

## 2020-10-05 ENCOUNTER — Ambulatory Visit (HOSPITAL_COMMUNITY): Payer: BC Managed Care – PPO | Admitting: Certified Registered"

## 2020-10-05 ENCOUNTER — Encounter (HOSPITAL_COMMUNITY): Payer: Self-pay

## 2020-10-05 ENCOUNTER — Ambulatory Visit (HOSPITAL_COMMUNITY)
Admission: RE | Admit: 2020-10-05 | Discharge: 2020-10-05 | Disposition: A | Discharge status: Home / Self Care | Payer: BC Managed Care – PPO | Attending: Internal Medicine | Admitting: Internal Medicine

## 2020-10-05 ENCOUNTER — Encounter (HOSPITAL_COMMUNITY)
Admission: RE | Disposition: A | Discharge status: Home / Self Care | Payer: Self-pay | Source: Home / Self Care | Attending: Internal Medicine

## 2020-10-05 DIAGNOSIS — K449 Diaphragmatic hernia without obstruction or gangrene: Secondary | ICD-10-CM | POA: Diagnosis not present

## 2020-10-05 DIAGNOSIS — K3189 Other diseases of stomach and duodenum: Secondary | ICD-10-CM | POA: Diagnosis not present

## 2020-10-05 DIAGNOSIS — Z79899 Other long term (current) drug therapy: Secondary | ICD-10-CM | POA: Insufficient documentation

## 2020-10-05 DIAGNOSIS — Z820 Family history of epilepsy and other diseases of the nervous system: Secondary | ICD-10-CM | POA: Insufficient documentation

## 2020-10-05 DIAGNOSIS — Z888 Allergy status to other drugs, medicaments and biological substances status: Secondary | ICD-10-CM | POA: Insufficient documentation

## 2020-10-05 DIAGNOSIS — R1310 Dysphagia, unspecified: Secondary | ICD-10-CM | POA: Insufficient documentation

## 2020-10-05 DIAGNOSIS — Z800 Family history of malignant neoplasm of digestive organs: Secondary | ICD-10-CM | POA: Insufficient documentation

## 2020-10-05 DIAGNOSIS — J45909 Unspecified asthma, uncomplicated: Secondary | ICD-10-CM | POA: Diagnosis not present

## 2020-10-05 DIAGNOSIS — K317 Polyp of stomach and duodenum: Secondary | ICD-10-CM | POA: Diagnosis not present

## 2020-10-05 DIAGNOSIS — Z9889 Other specified postprocedural states: Secondary | ICD-10-CM | POA: Diagnosis not present

## 2020-10-05 DIAGNOSIS — Z881 Allergy status to other antibiotic agents status: Secondary | ICD-10-CM | POA: Insufficient documentation

## 2020-10-05 DIAGNOSIS — Z8249 Family history of ischemic heart disease and other diseases of the circulatory system: Secondary | ICD-10-CM | POA: Insufficient documentation

## 2020-10-05 DIAGNOSIS — Z8261 Family history of arthritis: Secondary | ICD-10-CM | POA: Insufficient documentation

## 2020-10-05 DIAGNOSIS — M797 Fibromyalgia: Secondary | ICD-10-CM | POA: Diagnosis not present

## 2020-10-05 DIAGNOSIS — Z803 Family history of malignant neoplasm of breast: Secondary | ICD-10-CM | POA: Insufficient documentation

## 2020-10-05 DIAGNOSIS — R569 Unspecified convulsions: Secondary | ICD-10-CM | POA: Diagnosis not present

## 2020-10-05 DIAGNOSIS — Z7982 Long term (current) use of aspirin: Secondary | ICD-10-CM | POA: Diagnosis not present

## 2020-10-05 DIAGNOSIS — Z833 Family history of diabetes mellitus: Secondary | ICD-10-CM | POA: Insufficient documentation

## 2020-10-05 DIAGNOSIS — Z882 Allergy status to sulfonamides status: Secondary | ICD-10-CM | POA: Diagnosis not present

## 2020-10-05 DIAGNOSIS — K589 Irritable bowel syndrome without diarrhea: Secondary | ICD-10-CM | POA: Insufficient documentation

## 2020-10-05 DIAGNOSIS — Z7952 Long term (current) use of systemic steroids: Secondary | ICD-10-CM | POA: Insufficient documentation

## 2020-10-05 DIAGNOSIS — Z8673 Personal history of transient ischemic attack (TIA), and cerebral infarction without residual deficits: Secondary | ICD-10-CM | POA: Diagnosis not present

## 2020-10-05 DIAGNOSIS — K222 Esophageal obstruction: Secondary | ICD-10-CM | POA: Insufficient documentation

## 2020-10-05 DIAGNOSIS — Z90710 Acquired absence of both cervix and uterus: Secondary | ICD-10-CM | POA: Diagnosis not present

## 2020-10-05 DIAGNOSIS — G7000 Myasthenia gravis without (acute) exacerbation: Secondary | ICD-10-CM | POA: Insufficient documentation

## 2020-10-05 DIAGNOSIS — K219 Gastro-esophageal reflux disease without esophagitis: Secondary | ICD-10-CM | POA: Insufficient documentation

## 2020-10-05 DIAGNOSIS — Z880 Allergy status to penicillin: Secondary | ICD-10-CM | POA: Diagnosis not present

## 2020-10-05 DIAGNOSIS — Z91048 Other nonmedicinal substance allergy status: Secondary | ICD-10-CM | POA: Insufficient documentation

## 2020-10-05 DIAGNOSIS — Z886 Allergy status to analgesic agent status: Secondary | ICD-10-CM | POA: Diagnosis not present

## 2020-10-05 DIAGNOSIS — Z9049 Acquired absence of other specified parts of digestive tract: Secondary | ICD-10-CM | POA: Diagnosis not present

## 2020-10-05 DIAGNOSIS — G4730 Sleep apnea, unspecified: Secondary | ICD-10-CM | POA: Diagnosis not present

## 2020-10-05 DIAGNOSIS — Z885 Allergy status to narcotic agent status: Secondary | ICD-10-CM | POA: Insufficient documentation

## 2020-10-05 HISTORY — PX: BALLOON DILATION: SHX5330

## 2020-10-05 HISTORY — PX: POLYPECTOMY: SHX5525

## 2020-10-05 HISTORY — PX: ESOPHAGOGASTRODUODENOSCOPY (EGD) WITH PROPOFOL: SHX5813

## 2020-10-05 SURGERY — ESOPHAGOGASTRODUODENOSCOPY (EGD) WITH PROPOFOL
Anesthesia: General

## 2020-10-05 MED ORDER — PROPOFOL 10 MG/ML IV BOLUS
INTRAVENOUS | Status: DC | PRN
  Administered 2020-10-05: 20 mg via INTRAVENOUS
  Administered 2020-10-05: 50 mg via INTRAVENOUS
  Administered 2020-10-05: 150 mg via INTRAVENOUS

## 2020-10-05 MED ORDER — GLYCOPYRROLATE 0.2 MG/ML IJ SOLN
0.2000 mg | Freq: Once | INTRAMUSCULAR | Status: AC
  Administered 2020-10-05: 0.2 mg via INTRAVENOUS

## 2020-10-05 MED ORDER — GLYCOPYRROLATE 0.2 MG/ML IJ SOLN
INTRAMUSCULAR | Status: AC
  Filled 2020-10-05: qty 1

## 2020-10-05 MED ORDER — LACTATED RINGERS IV SOLN: INTRAVENOUS | Status: DC

## 2020-10-05 MED ORDER — LIDOCAINE VISCOUS HCL 2 % MT SOLN
OROMUCOSAL | Status: AC
  Filled 2020-10-05: qty 15

## 2020-10-05 MED ORDER — LIDOCAINE HCL (CARDIAC) PF 50 MG/5ML IV SOSY
PREFILLED_SYRINGE | INTRAVENOUS | Status: DC | PRN
  Administered 2020-10-05: 50 mg via INTRAVENOUS

## 2020-10-05 MED ORDER — LIDOCAINE VISCOUS HCL 2 % MT SOLN
15.0000 mL | Freq: Once | OROMUCOSAL | Status: AC
  Administered 2020-10-05: 15 mL via OROMUCOSAL

## 2020-10-05 MED ORDER — STERILE WATER FOR IRRIGATION IR SOLN
Status: DC | PRN
  Administered 2020-10-05: 1.5 mL

## 2020-10-05 MED ORDER — PROPOFOL 500 MG/50ML IV EMUL
INTRAVENOUS | Status: DC | PRN
  Administered 2020-10-05: 125 ug/kg/min via INTRAVENOUS

## 2020-10-05 NOTE — Op Note (Signed)
[facility] Patient Name: Yesenia Boyd Procedure Date: 10/05/2020 7:41 AM MRN: [id number] Date of Birth: [birth year] Attending MD: Elon Alas. Abbey Chatters DO CSN: [id number] Age: 64 Admit Type: Outpatient Procedure:                Upper GI endoscopy Indications:              Dysphagia Providers:                Elon Alas. Abbey Chatters, DO, Caprice Kluver, Casimer Bilis, Technician Referring MD:              Medicines:                See the Anesthesia note for documentation of the                            administered medications Complications:            No immediate complications. Estimated Blood Loss:     Estimated blood loss was minimal. Procedure:                Pre-Anesthesia Assessment:                           - The anesthesia plan was to use monitored                            anesthesia care (MAC).                           After obtaining informed consent, the endoscope was                            passed under direct vision. Throughout the                            procedure, the patient's blood pressure, pulse, and                            oxygen saturations were monitored continuously. The                            GIF-H190 ([id number]) scope was introduced through the                            mouth, and advanced to the second part of duodenum.                            The upper GI endoscopy was accomplished without                            difficulty. The patient tolerated the procedure                            well. Scope In: 8:00:16 AM Scope Out:  8:09:17 AM Total Procedure Duration: 0 hours 9 minutes 1 second  Findings:      A prior Nissen fundoplication was found in the lower third of the       esophagus.      One benign-appearing, intrinsic mild stenosis was found in the lower       third of the esophagus. The stenosis was traversed. A TTS dilator was       passed through the scope. Dilation with an 18-19-20 mm balloon  dilator       was performed to 18 mm. The dilation site was examined and showed mild       mucosal disruption and moderate improvement in luminal narrowing.      One 5 mm sessile polyp with no bleeding and no stigmata of recent       bleeding was found in the gastric antrum. Biopsies were taken with a       cold forceps for histology.      One 4 mm sessile polyp with no bleeding and no stigmata of recent       bleeding was found in the gastric fundus. Biopsies were taken with a       cold forceps for histology.      The duodenal bulb, first portion of the duodenum and second portion of       the duodenum were normal. Impression:               - A Nissen fundoplication was found.                           - Benign-appearing esophageal stenosis. Dilated.                           - One gastric polyp. Biopsied.                           - One gastric polyp. Biopsied.                           - Normal duodenal bulb, first portion of the                            duodenum and second portion of the duodenum. Moderate Sedation:      Per Anesthesia Care Recommendation:           - Patient has a contact number available for                            emergencies. The signs and symptoms of potential                            delayed complications were discussed with the                            patient. Return to normal activities tomorrow.                            Written discharge instructions were provided to the  patient.                           - Resume previous diet.                           - Continue present medications.                           - Await pathology results.                           - Repeat upper endoscopy PRN for retreatment.                           - Return to GI clinic in 2 months. Procedure Code(s):        --- Professional ---                           [phone], Esophagogastroduodenoscopy, flexible,                             transoral; with transendoscopic balloon dilation of                            esophagus (less than 30 mm diameter)                           [id number], 59, Esophagogastroduodenoscopy, flexible,                            transoral; with biopsy, single or multiple Diagnosis Code(s):        --- Professional ---                           V95.638, Other specified postprocedural states                           K22.2, Esophageal obstruction                           K31.7, Polyp of stomach and duodenum                           R13.10, Dysphagia, unspecified CPT copyright 2019 American Medical Association. All rights reserved. The codes documented in this report are preliminary and upon coder review may  be revised to meet current compliance requirements. Elon Alas. Abbey Chatters, DO [name], DO 10/05/2020 8:17:24 AM This report has been signed electronically. Number of Addenda: 0

## 2020-10-05 NOTE — Anesthesia Postprocedure Evaluation (Signed)
Anesthesia Post Note  Patient: Yesenia Boyd  Procedure(s) Performed: ESOPHAGOGASTRODUODENOSCOPY (EGD) WITH PROPOFOL (N/A ) BALLOON DILATION (N/A ) POLYPECTOMY  Patient location during evaluation: Endoscopy Anesthesia Type: General Level of consciousness: awake and alert and patient cooperative Pain management: satisfactory to patient Vital Signs Assessment: post-procedure vital signs reviewed and stable Respiratory status: spontaneous breathing Cardiovascular status: stable Postop Assessment: no apparent nausea or vomiting Anesthetic complications: no   No complications documented.   Last Vitals:  Vitals:   10/05/20 0655 10/05/20 0816  BP: 132/73 122/75  Pulse: 74 81  Resp: 20 19  Temp: 36.7 C 36.4 C  SpO2: 100% 100%    Last Pain:  Vitals:   10/05/20 0816  TempSrc: Oral  PainSc: 0-No pain                 Diany Formosa

## 2020-10-05 NOTE — Anesthesia Procedure Notes (Signed)
Date/Time: 10/05/2020 7:53 AM Performed by: Franco Nones, CRNA Pre-anesthesia Checklist: Patient identified, Emergency Drugs available, Suction available, Timeout performed and Patient being monitored Patient Re-evaluated:Patient Re-evaluated prior to induction Oxygen Delivery Method: Nasal Cannula

## 2020-10-05 NOTE — Interval H&P Note (Signed)
History and Physical Interval Note:  10/05/2020 7:27 AM  Yesenia Boyd  has presented today for surgery, with the diagnosis of dysphagia, dyspepsia.  The various methods of treatment have been discussed with the patient and family. After consideration of risks, benefits and other options for treatment, the patient has consented to  Procedure(s) with comments: ESOPHAGOGASTRODUODENOSCOPY (EGD) WITH PROPOFOL (N/A) - 8:00am BALLOON DILATION (N/A) as a surgical intervention.  The patient's history has been reviewed, patient examined, no change in status, stable for surgery.  I have reviewed the patient's chart and labs.  Questions were answered to the patient's satisfaction.     Lanelle Bal

## 2020-10-05 NOTE — Discharge Instructions (Addendum)
EGD Discharge instructions Please read the instructions outlined below and refer to this sheet in the next few weeks. These discharge instructions provide you with general information on caring for yourself after you leave the hospital. Your doctor may also give you specific instructions. While your treatment has been planned according to the most current medical practices available, unavoidable complications occasionally occur. If you have any problems or questions after discharge, please call your doctor. ACTIVITY  You may resume your regular activity but move at a slower pace for the next 24 hours.   Take frequent rest periods for the next 24 hours.   Walking will help expel (get rid of) the air and reduce the bloated feeling in your abdomen.   No driving for 24 hours (because of the anesthesia (medicine) used during the test).   You may shower.   Do not sign any important legal documents or operate any machinery for 24 hours (because of the anesthesia used during the test).  NUTRITION  Drink plenty of fluids.   You may resume your normal diet.   Begin with a light meal and progress to your normal diet.   Avoid alcoholic beverages for 24 hours or as instructed by your caregiver.  MEDICATIONS  You may resume your normal medications unless your caregiver tells you otherwise.  WHAT YOU CAN EXPECT TODAY  You may experience abdominal discomfort such as a feeling of fullness or "gas" pains.  FOLLOW-UP  Your doctor will discuss the results of your test with you.  SEEK IMMEDIATE MEDICAL ATTENTION IF ANY OF THE FOLLOWING OCCUR:  Excessive nausea (feeling sick to your stomach) and/or vomiting.   Severe abdominal pain and distention (swelling).   Trouble swallowing.   Temperature over 101 F (37.8 C).   Rectal bleeding or vomiting of blood.   Your EGD showed a tightening in the distal part of your esophagus which I dilated with a balloon.  Hopefully this helps with your  swallowing difficulties.  I did note where you had a previous surgery for reflux.  Continue on esomeprazole twice daily.  You had 2 small polyps in your stomach which I biopsied.  Await pathology results, my office will contact you.  Follow-up with GI in 2 to 3 months.   I hope you have a great rest of your week!  Hennie Duos. Marletta Lor, D.O. Gastroenterology and Hepatology [facility] ]

## 2020-10-05 NOTE — Anesthesia Preprocedure Evaluation (Signed)
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    Pulmonary asthma , sleep apnea ,    Pulmonary exam normal breath sounds clear to auscultation       Cardiovascular Exercise Tolerance: Good negative cardio ROS   Rhythm:regular Rate:Normal     Neuro/Psych Seizures -, Well Controlled,  PSYCHIATRIC DISORDERS Depression TIA Neuromuscular disease    GI/Hepatic Neg liver ROS, hiatal hernia, GERD  Medicated,  Endo/Other  negative endocrine ROS  Renal/GU negative Renal ROS  negative genitourinary   Musculoskeletal   Abdominal   Peds  Hematology negative hematology ROS (+)   Anesthesia Other Findings Discussed propofol "allergy" with pt.  Tolerated propofol well in 2019.  Will use it today.  Reproductive/Obstetrics negative OB ROS                             Anesthesia Physical Anesthesia Plan  ASA: III  Anesthesia Plan: General   Post-op Pain Management:    Induction:   PONV Risk Score and Plan: Propofol infusion  Airway Management Planned:   Additional Equipment:   Intra-op Plan:   Post-operative Plan:   Informed Consent: I have reviewed the patients History and Physical, chart, labs and discussed the procedure including the risks, benefits and alternatives for the proposed anesthesia with the patient or authorized representative who has indicated his/her understanding and acceptance.     Dental Advisory Given  Plan Discussed with: CRNA  Anesthesia Plan Comments:         Anesthesia Quick  Evaluation

## 2020-10-05 NOTE — Transfer of Care (Signed)
Immediate Anesthesia Transfer of Care Note  Patient: Yesenia Boyd  Procedure(s) Performed: ESOPHAGOGASTRODUODENOSCOPY (EGD) WITH PROPOFOL (N/A ) BALLOON DILATION (N/A ) POLYPECTOMY  Patient Location: Endoscopy Unit  Anesthesia Type:General  Level of Consciousness: awake and patient cooperative  Airway & Oxygen Therapy: Patient Spontanous Breathing  Post-op Assessment: Report given to RN and Post -op Vital signs reviewed and stable  Post vital signs: Reviewed and stable  Last Vitals:  Vitals Value Taken Time  BP    Temp    Pulse    Resp    SpO2      Last Pain:  Vitals:   10/05/20 0754  TempSrc:   PainSc: 7       Patients Stated Pain Goal: 8 (10/05/20 0655)  Complications: No complications documented.

## 2020-10-06 ENCOUNTER — Telehealth: Payer: Self-pay | Admitting: *Deleted

## 2020-10-06 LAB — SURGICAL PATHOLOGY

## 2020-10-06 NOTE — Telephone Encounter (Signed)
Phoned pt and was advised by her that she is having hoarseness, some pain at the top of her throat and her reflux is worse this time. I asked her when was her last endoscopy and she couldn't remember but she stated it is worse this time. Please advise.

## 2020-10-06 NOTE — Telephone Encounter (Signed)
Patient reports she had procedure done yesterday by Dr. Marletta Lor and she is having hoarseness, reflux. She can be reached at [phone]

## 2020-10-07 ENCOUNTER — Encounter (HOSPITAL_COMMUNITY): Payer: Self-pay | Admitting: Internal Medicine

## 2020-10-15 ENCOUNTER — Other Ambulatory Visit: Payer: Self-pay | Admitting: Internal Medicine

## 2020-10-20 ENCOUNTER — Telehealth: Payer: Self-pay | Admitting: Internal Medicine

## 2020-10-20 NOTE — Telephone Encounter (Signed)
Spoke with pt. Pt is aware that we will notify the doctor that she is inquiring about her EGD results.

## 2020-10-20 NOTE — Telephone Encounter (Signed)
Patient called asking about her procedure results  

## 2020-11-10 ENCOUNTER — Telehealth: Payer: Self-pay | Admitting: Internal Medicine

## 2020-11-10 NOTE — Telephone Encounter (Signed)
I called patient and discussed results with her on 10/08/2020 per path report.

## 2020-11-10 NOTE — Telephone Encounter (Signed)
[phone] PLEASE CALL PATIENT ABOUT HER THROAT.  SHE SAID SHE IS GETTING THE RUN AROUND

## 2020-11-10 NOTE — Telephone Encounter (Signed)
Routing message to [place].

## 2020-11-10 NOTE — Telephone Encounter (Signed)
Patient called in very upset that she has not received recommendations for the issues she's been having with her throat.  There is documentation on her path report related to her EGD that Dr. Marletta Lor spoke with her, however she states he didn't go over path results.  Routing to Dr. Marletta Lor for advice

## 2020-11-12 NOTE — Telephone Encounter (Signed)
I called patient 11/10/2020 and spoke with her for nearly 30 minutes.  Discussed her pathology results with her again from her recent procedures.  We also went over all of her GI medications.  She states her primary care physician is wanting her to get off some of her GI medications which I do not necessarily disagree with.  However this will be difficult as patient is very hesitant to stop any of her medications.  After going through all of her GI medications, she has agreed to decrease her Carafate to nighttime dosing only and change her Maalox to as needed for breakthrough symptoms.  She will continue to take Nexium twice daily.  She does not wish to change her simethicone.  Hopefully we can continue to wean her off some these medications in the future.  We can also discuss trying amitriptyline at night.  Follow-up as previously scheduled.

## 2020-11-13 NOTE — Telephone Encounter (Signed)
Reviewed

## 2020-11-19 DIAGNOSIS — Z6827 Body mass index (BMI) 27.0-27.9, adult: Secondary | ICD-10-CM | POA: Diagnosis not present

## 2020-11-19 DIAGNOSIS — S83422A Sprain of lateral collateral ligament of left knee, initial encounter: Secondary | ICD-10-CM | POA: Diagnosis not present

## 2020-11-19 DIAGNOSIS — R5382 Chronic fatigue, unspecified: Secondary | ICD-10-CM | POA: Diagnosis not present

## 2020-11-19 DIAGNOSIS — F458 Other somatoform disorders: Secondary | ICD-10-CM | POA: Diagnosis not present

## 2020-12-01 ENCOUNTER — Ambulatory Visit: Payer: BC Managed Care – PPO | Admitting: Gastroenterology

## 2020-12-24 DIAGNOSIS — R5382 Chronic fatigue, unspecified: Secondary | ICD-10-CM | POA: Diagnosis not present

## 2020-12-24 DIAGNOSIS — S83422A Sprain of lateral collateral ligament of left knee, initial encounter: Secondary | ICD-10-CM | POA: Diagnosis not present

## 2020-12-24 DIAGNOSIS — F458 Other somatoform disorders: Secondary | ICD-10-CM | POA: Diagnosis not present

## 2020-12-24 DIAGNOSIS — Z6827 Body mass index (BMI) 27.0-27.9, adult: Secondary | ICD-10-CM | POA: Diagnosis not present

## 2020-12-24 DIAGNOSIS — Z0000 Encounter for general adult medical examination without abnormal findings: Secondary | ICD-10-CM | POA: Diagnosis not present

## 2021-01-13 ENCOUNTER — Other Ambulatory Visit: Payer: Self-pay | Admitting: *Deleted

## 2021-01-13 ENCOUNTER — Encounter: Payer: Self-pay | Admitting: Gastroenterology

## 2021-01-13 ENCOUNTER — Other Ambulatory Visit: Payer: Self-pay

## 2021-01-13 ENCOUNTER — Telehealth: Payer: Self-pay | Admitting: *Deleted

## 2021-01-13 ENCOUNTER — Ambulatory Visit: Payer: Medicare HMO | Admitting: Gastroenterology

## 2021-01-13 ENCOUNTER — Encounter: Payer: Self-pay | Admitting: *Deleted

## 2021-01-13 VITALS — BP 127/78 | HR 72 | Temp 96.8°F | Ht 63.0 in | Wt 167.4 lb

## 2021-01-13 DIAGNOSIS — R1319 Other dysphagia: Secondary | ICD-10-CM

## 2021-01-13 DIAGNOSIS — K5900 Constipation, unspecified: Secondary | ICD-10-CM | POA: Diagnosis not present

## 2021-01-13 DIAGNOSIS — R198 Other specified symptoms and signs involving the digestive system and abdomen: Secondary | ICD-10-CM | POA: Insufficient documentation

## 2021-01-13 MED ORDER — PEG 3350-KCL-NA BICARB-NACL 420 G PO SOLR: ORAL | 0 refills | Status: DC

## 2021-01-13 MED ORDER — LUBIPROSTONE 24 MCG PO CAPS: 24.0000 ug | ORAL_CAPSULE | Freq: Two times a day (BID) | ORAL | 5 refills | Status: DC

## 2021-01-13 NOTE — Patient Instructions (Signed)
I have sent in [name] to take twice a day with food. This can cause nausea if not taken with food. Let us know if any problems getting this filled.   We are arranging a colonoscopy with Dr. Marletta Lor in the future. Please let us know how you are doing with your bowel movements, as we want to prep well beforehand.  Please let me know if persistent nausea despite having better bowel movements.  We will see you in follow-up after the procedure!   I enjoyed seeing you again today! As you know, I value our relationship and want to provide genuine, compassionate, and quality care. I welcome your feedback. If you receive a survey regarding your visit,  I greatly appreciate you taking time to fill this out. See you next time!  Gelene Mink, PhD, ANP-BC [facility]

## 2021-01-13 NOTE — Progress Notes (Signed)
Referring Provider: Toma Deiters, MD Primary Care Physician:  Toma Deiters, MD Primary GI: Dr Marletta Lor   Chief Complaint  Patient presents with  . Constipation  . Nausea    HPI:   Yesenia Boyd is a 65 y.o. female presenting today with a complicated past GI history, which includes Nissen X 2, history of dysphagia with empiric dilation in Oct 2019, and most recently in Nov 2021 with EGD showing Nissen, benign-appearing stenosis s/p dilation, gastric polyps biospied and normal duodenum. Hyperplastic polyp and reactive gastropathy noted.   Dealing with constipation now. States Linzess caused worsening bloating. [name] 24 mcg BID in the past and did well.   Can't take big pills. Has to open up Nexium to take. If stressed out, food will come up. Eating soft foods. Notes nausea that is recurrent. Eats because she is supposed to eat, not because she is hungry. States doesn't feel like food being broken down.   Pain in lower abdomen, pressure in rectum. Feels pressure in rectum but nothing there. Has mucus from rectum. Will sometimes feel like can't urinate if lots of pressure in rectum. Taking Mag citrate small amount each day. Using glycerin suppositories. Stool gets stuck in rectum and states she has a curved rectum. No rectal bleeding.    No weight loss. Has gained weight.   Past Medical History:  Diagnosis Date  . Asthma   . Complication of anesthesia    Deep depression following administration of propofol  . Depression   . Fibromyalgia   . GERD (gastroesophageal reflux disease)   . History of hiatal hernia    2 surgeries, Lap. Nissen Fundiplication   . IBS (irritable bowel syndrome)   . Myasthenia gravis   . Peripheral neuropathy   . S/P colonoscopy    Dr. Linna Darner, internal hemorrhoids, repeat in Sept 2022  . S/P endoscopy    Dr. Linna Darner 2008: removal of impacted food bolus, Dr. Linna Darner 2010: moderate gastritis, Sept 2012 with SLF: path with mild gastritis, no  definite stricture noted, dilation with Savary 16 mm  . Seizures (HCC)   . Sleep apnea   . TIA (transient ischemic attack) 2012    Past Surgical History:  Procedure Laterality Date  . ABDOMINAL HYSTERECTOMY     complete per patient  . BALLOON DILATION N/A 10/05/2020   Procedure: BALLOON DILATION;  Surgeon: Lanelle Bal, DO;  Location: [facility];  Service: Endoscopy;  Laterality: N/A;  . BLADDER SURGERY     stretch bladder opening  . CHOLECYSTECTOMY    . COLONOSCOPY  07/18/2011   SLF: 1. internal hemorrhoids  . COLONOSCOPY WITH PROPOFOL N/A 03/21/2017   Dr. Darrick Penna: Redundant left colon, hemorrhoids, next colonoscopy in 10 years.  Patient reports deep pression after propofol.  . ESOPHAGEAL MANOMETRY N/A 01/21/2013  . esophageal surgery?     2008 [facility]  . ESOPHAGOGASTRODUODENOSCOPY  07/18/2011   SLF: 1. Moderate gastritis 2. Dysphagia most likely 2o large food bolus moving through her Nissen, which appears to be intact. Pt has poor denttition and NL BPE 2 years ago. 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  . ESOPHAGOGASTRODUODENOSCOPY (EGD) WITH PROPOFOL N/A 08/21/2018   dr. Darrick Penna: mild gastritis, esophagus appeared normal. s/p dilation  . ESOPHAGOGASTRODUODENOSCOPY (EGD) WITH PROPOFOL N/A 10/05/2020   Nissen, benign-appearing stenosis s/p dilation, gastric polyps biospied and normal duodenum. Hyperplastic polyp and reactive gastropathy noted.   Marland Kitchen EVALUATION UNDER ANESTHESIA WITH HEMORRHOIDECTOMY  N/A 03/27/2015   Procedure: EXAM UNDER ANESTHESIA WITH SPHINCTEROTOMY ;  Surgeon: Luretha MurphyMatthew Martin, MD;  Location: [facility];  Service: General;  Laterality: N/A;  . FLEXIBLE SIGMOIDOSCOPY N/A 12/09/2014   SLF: Rectal pain most likely due to internal and external hemorroids  . HAND SURGERY     carpal tumnnel right and removal of cyst left  . HEMORRHOID BANDING N/A 12/09/2014   Procedure: HEMORRHOID BANDING;  Surgeon: West BaliSandi L Fields, MD;  Location: [facility];   Service: Endoscopy;  Laterality: N/A;  . LAPAROSCOPIC NISSEN FUNDOPLICATION N/A 02/13/2013   Procedure: Redo LAPAROSCOPIC NISSEN FUNDOPLICATION;  Surgeon: Valarie MerinoMatthew B Martin, MD;  Location: [facility];  Service: General;  Laterality: N/A;  Forgut explortation with partial  takedown nissan funliplication from 1994 for wrap torsion and persistant dysphagia  . NASAL SEPTOPLASTY W/ TURBINOPLASTY Bilateral 08/03/2016   Procedure: NASAL SEPTOPLASTY WITH TURBINATE REDUCTION;  Surgeon: Newman PiesSu Teoh, MD;  Location: [facility];  Service: ENT;  Laterality: Bilateral;  . NECK SURGERY     removal of "knot" from neck  . NISSEN FUNDOPLICATION     initially in 1994. redo in 2014  . POLYPECTOMY  10/05/2020   Procedure: POLYPECTOMY;  Surgeon: Lanelle Balarver, Charles K, DO;  Location: [facility];  Service: Endoscopy;;  . SAVORY DILATION  07/18/2011   Surgeon:Sandi M Fields  . SAVORY DILATION  08/21/2018   Procedure: SAVORY DILATION;  Surgeon: West BaliFields, Sandi L, MD;  Location: [facility];  Service: Endoscopy;;    Current Outpatient Medications  Medication Sig Dispense Refill  . acetaminophen (TYLENOL) 650 MG CR tablet Take 1,300 mg by mouth at bedtime.    Marland Kitchen. albuterol (PROVENTIL HFA;VENTOLIN HFA) 108 (90 Base) MCG/ACT inhaler Inhale 1-2 puffs into the lungs every 6 (six) hours as needed for wheezing or shortness of breath.     . Ascorbic Acid (VITAMIN C GUMMIE PO) Take 2-3 tablets by mouth every evening.    Marland Kitchen. CARAFATE 1 GM/10ML suspension Take 3 g by mouth 3 (three) times daily.    . Cholecalciferol (VITAMIN D-3) 125 MCG (5000 UT) TABS Take 10,000 Units by mouth at bedtime.    . Coenzyme Q10 (COQ10 PO) Take 100 mg by mouth in the morning and at bedtime. 100 mg/10 mls    . cyanocobalamin (,VITAMIN B-12,) 1000 MCG/ML injection Inject 1,000 mcg into the muscle every 30 (thirty) days.    . diclofenac Sodium (VOLTAREN) 1 % GEL Apply 1 application topically 4 (four) times daily as needed (arthritis pain.).    Marland Kitchen. EPINEPHrine 0.3 mg/0.3 mL IJ  SOAJ injection Inject 0.3 mg into the muscle as needed (allergic reaction).    Marland Kitchen. esomeprazole (NEXIUM) 40 MG capsule TAKE ONE CAPSULE BY MOUTH TWICE DAILY BEFORE A MEAL (Patient taking differently: Take 40 mg by mouth in the morning and at bedtime.) 60 capsule 11  . estrogens, conjugated, (PREMARIN) 0.625 MG tablet Take 0.625 mg by mouth at bedtime.     . Glucosamine-Chondroitin (MOVE FREE PO) Take 1 tablet by mouth every evening.     . hydrALAZINE (APRESOLINE) 25 MG tablet Take 25 mg by mouth daily.    . hydrOXYzine (ATARAX/VISTARIL) 25 MG tablet Take 25 mg by mouth at bedtime.     Marland Kitchen. ketoconazole (NIZORAL) 2 % cream Apply 1 application topically 2 (two) times daily as needed (skin rash).     Marland Kitchen. L-Methylfolate-B6-B12 (FOLTANX) 3-35-2 MG TABS Take 1 tablet by mouth at bedtime.   0  . lidocaine (XYLOCAINE) 2 % solution USE 15  MLS AS DIRECTED IN MOUTH OR THROAT EVERY 6 HOURS AS NEEDED (INDIGESTION) (lidocain vis/ maalox 1:1) 300 mL 0  . loratadine (CLARITIN) 10 MG tablet Take 10 mg by mouth every evening.     . magnesium citrate SOLN Take 0.5-1 Bottles by mouth as needed for severe constipation.    . montelukast (SINGULAIR) 10 MG tablet Take 10 mg by mouth at bedtime.    . Multiple Vitamin (MULTIVITAMIN WITH MINERALS) TABS tablet Take 1 tablet by mouth every evening. Women's Multivitamin    . Multiple Vitamins-Minerals (HAIR/SKIN/NAILS/BIOTIN PO) Take 2 tablets by mouth every evening. Gummy    . NASACORT ALLERGY 24HR 55 MCG/ACT AERO nasal inhaler Place 1-2 sprays into the nose at bedtime.     . nitroGLYCERIN (NITROSTAT) 0.4 MG SL tablet Place 0.4 mg under the tongue every 5 (five) minutes x 3 doses as needed for chest pain.     Marland Kitchen ondansetron (ZOFRAN) 4 MG tablet Take 1 tablet (4 mg total) by mouth every 8 (eight) hours as needed for nausea or vomiting. 60 tablet 3  . rizatriptan (MAXALT) 10 MG tablet Take 10 mg by mouth 2 (two) times daily as needed for migraine.     . simethicone (MYLICON) 125 MG  chewable tablet Chew 250 mg by mouth in the morning and at bedtime.    Marland Kitchen tiZANidine (ZANAFLEX) 4 MG tablet Take 4-6 mg by mouth at bedtime.     . traZODone (DESYREL) 50 MG tablet Take 50 mg by mouth at bedtime as needed for sleep.     . vitamin E 1000 UNIT capsule Take 1,000 Units by mouth at bedtime.     Marland Kitchen alum & mag hydroxide-simeth (MAALOX/MYLANTA) 200-200-20 MG/5ML suspension Take 10 mLs by mouth 2 (two) times daily.  (Patient not taking: Reported on 01/13/2021)     No current facility-administered medications for this visit.    Allergies as of 01/13/2021 - Review Complete 01/13/2021  Allergen Reaction Noted  . Aspirin Other (See Comments) 07/28/2016  . Avelox [moxifloxacin hcl in nacl] Anaphylaxis and Itching 06/20/2011  . Linzess [linaclotide] Swelling 02/04/2013  . Penicillins Anaphylaxis and Hives   . Adhesive [tape] Other (See Comments) 01/24/2013  . Carbamazepine Other (See Comments) 03/16/2015  . Cephalexin Hives and Itching   . Clindamycin/lincomycin Hives and Itching 12/17/2018  . Codeine Hives and Itching   . Cymbalta [duloxetine hcl] Other (See Comments) 06/20/2011  . Darvon [propoxyphene] Hives 03/17/2015  . Dicyclomine Itching and Other (See Comments) 06/20/2011  . Hydrocodone-acetaminophen Itching 03/15/2017  . Ketek [telithromycin] Hives and Itching 09/06/2017  . Latex Itching 01/24/2013  . Macrolides and ketolides Swelling 07/18/2011  . Meperidine hcl Itching and Other (See Comments)   . Metronidazole Hives and Other (See Comments) 06/20/2011  . Morphine Itching and Other (See Comments)   . Mupirocin Itching 12/17/2018  . Pepcid [famotidine] Nausea And Vomiting 03/16/2015  . Prednisone Hives, Swelling, and Other (See Comments) 06/20/2011  . Propofol Other (See Comments) 03/17/2015  . Protonix [pantoprazole] Swelling 03/17/2015  . Solodyn [minocycline hcl er]  11/21/2019  . Sulfonamide derivatives Hives   . Tramadol hcl Hives and Other (See Comments)   . Zylet  [loteprednol-tobramycin] Hives and Swelling 03/15/2017    Family History  Problem Relation Age of Onset  . Anesthesia problems Sister   . Cancer Sister        breast  . Hypertension Sister   . Parkinson's disease Mother   . Kidney disease Mother   . Diabetes Mother   .  Heart attack Father   . Cancer Father        stomach cancer  . Hypertension Brother   . Diabetes Brother   . Arthritis Brother   . Kidney disease Brother   . Parkinson's disease Brother   . Cancer Maternal Aunt        breast  . Cancer Maternal Grandmother        breast  . Multiple sclerosis Daughter   . Colon cancer Cousin        4 cousins  . Pseudochol deficiency Neg Hx   . Hypotension Neg Hx   . Malignant hyperthermia Neg Hx     Social History   Socioeconomic History  . Marital status: Married    Spouse name: Not on file  . Number of children: Not on file  . Years of education: Not on file  . Highest education level: Not on file  Occupational History  . Not on file  Tobacco Use  . Smoking status: Never Smoker  . Smokeless tobacco: Never Used  Vaping Use  . Vaping Use: Never used  Substance and Sexual Activity  . Alcohol use: Not Currently    Comment: drinks wine "every blue moon"  . Drug use: No  . Sexual activity: Not on file  Other Topics Concern  . Not on file  Social History Narrative  . Not on file   Social Determinants of Health   Financial Resource Strain: Not on file  Food Insecurity: Not on file  Transportation Needs: Not on file  Physical Activity: Not on file  Stress: Not on file  Social Connections: Not on file    Review of Systems: Gen: Denies fever, chills, anorexia. Denies fatigue, weakness, weight loss.  CV: Denies chest pain, palpitations, syncope, peripheral edema, and claudication. Resp: Denies dyspnea at rest, cough, wheezing, coughing up blood, and pleurisy. GI: see HPI Derm: Denies rash, itching, dry skin Psych: Denies depression, anxiety, memory loss,  confusion. No homicidal or suicidal ideation.  Heme: Denies bruising, bleeding, and enlarged lymph nodes.  Physical Exam: BP 127/78   Pulse 72   Temp (!) 96.8 F (36 C) (Temporal)   Ht 5\' 3"  (1.6 m)   Wt 167 lb 6.4 oz (75.9 kg)   BMI 29.65 kg/m  General:   Alert and oriented. No distress noted. Pleasant and cooperative.  Head:  Normocephalic and atraumatic. Eyes:  Conjuctiva clear without scleral icterus. Mouth:  Mask in place Lungs: clear bilaterally Cardiac: S1 S2 present without murmurs Abdomen:  +BS, soft, non-tender and non-distended. No rebound or guarding. No HSM or masses noted. Rectal: tight sphincter, small prolapsing hemorrhoid reducible Msk:  Symmetrical without gross deformities. Normal posture. Extremities:  Without edema. Neurologic:  Alert and  oriented x4 Psych:  Alert and cooperative. Normal mood and affect.  ASSESSMENT: Yesenia Boyd is a 65 y.o. female presenting today with a complicated past GI history, which includes Nissen X 2, history of dysphagia with empiric dilation in Oct 2019, and most recently in Nov 2021 with EGD showing Nissen, benign-appearing stenosis s/p dilation, gastric polyps biospied and normal duodenum. Hyperplastic polyp and reactive gastropathy noted. Chronic constipation also noted, with concerns for rectal pressure and mucus discharge.   Dysphagia: unable to take larger pills. Remaining on soft foods. Continue Nexium.  Nausea: states worsened with constipation. If despite bowel regimen this persists, recommend GES.  Constipation: Linzess with bloating. Will resume [name] 24 mcg po BID as this did well in the past. Although class  side effect of nausea, I have reminded her to take with food to avoid this.   Lower abdominal pain: query related to constipation. No weight loss; in fact, she has had weight gain.   Rectal pressure: DRE unrevealing. Small prolapsing hemorrhoid noted. Mucus discharge likely benign anorectal source such as  hemorrhoids. Desiring colonoscopy. Last in 2018. No overt rectal bleeding.      PLAN:  Proceed with colonoscopy by Dr. Marletta Lor  in near future: the risks, benefits, and alternatives have been discussed with the patient in detail. The patient states understanding and desires to proceed.   Start [name] 24 mcg po BID with food  Consider GES if persistent nausea despite bowel regimen  Continue Nexium  Follow-up after colonoscopy   Gelene Mink, PhD, ANP-BC [facility]

## 2021-01-13 NOTE — Telephone Encounter (Signed)
PA done via [facility]. Pending clinical review. Tracking# [id number]

## 2021-01-13 NOTE — Telephone Encounter (Signed)
Called pt. Made aware of pre-op/covid test appt details. She voiced understanding.

## 2021-01-13 NOTE — H&P (View-Only) (Signed)
Referring Provider: Toma Deiters, MD Primary Care Physician:  Toma Deiters, MD Primary GI: Dr Marletta Lor   Chief Complaint  Patient presents with  . Constipation  . Nausea    HPI:   Yesenia Boyd is a 65 y.o. female presenting today with a complicated past GI history, which includes Nissen X 2, history of dysphagia with empiric dilation in Oct 2019, and most recently in Nov 2021 with EGD showing Nissen, benign-appearing stenosis s/p dilation, gastric polyps biospied and normal duodenum. Hyperplastic polyp and reactive gastropathy noted.   Dealing with constipation now. States Linzess caused worsening bloating. [name] 24 mcg BID in the past and did well.   Can't take big pills. Has to open up Nexium to take. If stressed out, food will come up. Eating soft foods. Notes nausea that is recurrent. Eats because she is supposed to eat, not because she is hungry. States doesn't feel like food being broken down.   Pain in lower abdomen, pressure in rectum. Feels pressure in rectum but nothing there. Has mucus from rectum. Will sometimes feel like can't urinate if lots of pressure in rectum. Taking Mag citrate small amount each day. Using glycerin suppositories. Stool gets stuck in rectum and states she has a curved rectum. No rectal bleeding.    No weight loss. Has gained weight.   Past Medical History:  Diagnosis Date  . Asthma   . Complication of anesthesia    Deep depression following administration of propofol  . Depression   . Fibromyalgia   . GERD (gastroesophageal reflux disease)   . History of hiatal hernia    2 surgeries, Lap. Nissen Fundiplication   . IBS (irritable bowel syndrome)   . Myasthenia gravis   . Peripheral neuropathy   . S/P colonoscopy    Dr. Linna Darner, internal hemorrhoids, repeat in Sept 2022  . S/P endoscopy    Dr. Linna Darner 2008: removal of impacted food bolus, Dr. Linna Darner 2010: moderate gastritis, Sept 2012 with SLF: path with mild gastritis, no  definite stricture noted, dilation with Savary 16 mm  . Seizures (HCC)   . Sleep apnea   . TIA (transient ischemic attack) 2012    Past Surgical History:  Procedure Laterality Date  . ABDOMINAL HYSTERECTOMY     complete per patient  . BALLOON DILATION N/A 10/05/2020   Procedure: BALLOON DILATION;  Surgeon: Lanelle Bal, DO;  Location: [facility];  Service: Endoscopy;  Laterality: N/A;  . BLADDER SURGERY     stretch bladder opening  . CHOLECYSTECTOMY    . COLONOSCOPY  07/18/2011   SLF: 1. internal hemorrhoids  . COLONOSCOPY WITH PROPOFOL N/A 03/21/2017   Dr. Darrick Penna: Redundant left colon, hemorrhoids, next colonoscopy in 10 years.  Patient reports deep pression after propofol.  . ESOPHAGEAL MANOMETRY N/A 01/21/2013  . esophageal surgery?     2008 [facility]  . ESOPHAGOGASTRODUODENOSCOPY  07/18/2011   SLF: 1. Moderate gastritis 2. Dysphagia most likely 2o large food bolus moving through her Nissen, which appears to be intact. Pt has poor denttition and NL BPE 2 years ago. 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  . ESOPHAGOGASTRODUODENOSCOPY (EGD) WITH PROPOFOL N/A 08/21/2018   dr. Darrick Penna: mild gastritis, esophagus appeared normal. s/p dilation  . ESOPHAGOGASTRODUODENOSCOPY (EGD) WITH PROPOFOL N/A 10/05/2020   Nissen, benign-appearing stenosis s/p dilation, gastric polyps biospied and normal duodenum. Hyperplastic polyp and reactive gastropathy noted.   Marland Kitchen EVALUATION UNDER ANESTHESIA WITH HEMORRHOIDECTOMY  N/A 03/27/2015   Procedure: EXAM UNDER ANESTHESIA WITH SPHINCTEROTOMY ;  Surgeon: Luretha MurphyMatthew Martin, MD;  Location: [facility];  Service: General;  Laterality: N/A;  . FLEXIBLE SIGMOIDOSCOPY N/A 12/09/2014   SLF: Rectal pain most likely due to internal and external hemorroids  . HAND SURGERY     carpal tumnnel right and removal of cyst left  . HEMORRHOID BANDING N/A 12/09/2014   Procedure: HEMORRHOID BANDING;  Surgeon: West BaliSandi L Fields, MD;  Location: [facility];   Service: Endoscopy;  Laterality: N/A;  . LAPAROSCOPIC NISSEN FUNDOPLICATION N/A 02/13/2013   Procedure: Redo LAPAROSCOPIC NISSEN FUNDOPLICATION;  Surgeon: Valarie MerinoMatthew B Martin, MD;  Location: [facility];  Service: General;  Laterality: N/A;  Forgut explortation with partial  takedown nissan funliplication from 1994 for wrap torsion and persistant dysphagia  . NASAL SEPTOPLASTY W/ TURBINOPLASTY Bilateral 08/03/2016   Procedure: NASAL SEPTOPLASTY WITH TURBINATE REDUCTION;  Surgeon: Newman PiesSu Teoh, MD;  Location: [facility];  Service: ENT;  Laterality: Bilateral;  . NECK SURGERY     removal of "knot" from neck  . NISSEN FUNDOPLICATION     initially in 1994. redo in 2014  . POLYPECTOMY  10/05/2020   Procedure: POLYPECTOMY;  Surgeon: Lanelle Balarver, Charles K, DO;  Location: [facility];  Service: Endoscopy;;  . SAVORY DILATION  07/18/2011   Surgeon:Sandi M Fields  . SAVORY DILATION  08/21/2018   Procedure: SAVORY DILATION;  Surgeon: West BaliFields, Sandi L, MD;  Location: [facility];  Service: Endoscopy;;    Current Outpatient Medications  Medication Sig Dispense Refill  . acetaminophen (TYLENOL) 650 MG CR tablet Take 1,300 mg by mouth at bedtime.    Marland Kitchen. albuterol (PROVENTIL HFA;VENTOLIN HFA) 108 (90 Base) MCG/ACT inhaler Inhale 1-2 puffs into the lungs every 6 (six) hours as needed for wheezing or shortness of breath.     . Ascorbic Acid (VITAMIN C GUMMIE PO) Take 2-3 tablets by mouth every evening.    Marland Kitchen. CARAFATE 1 GM/10ML suspension Take 3 g by mouth 3 (three) times daily.    . Cholecalciferol (VITAMIN D-3) 125 MCG (5000 UT) TABS Take 10,000 Units by mouth at bedtime.    . Coenzyme Q10 (COQ10 PO) Take 100 mg by mouth in the morning and at bedtime. 100 mg/10 mls    . cyanocobalamin (,VITAMIN B-12,) 1000 MCG/ML injection Inject 1,000 mcg into the muscle every 30 (thirty) days.    . diclofenac Sodium (VOLTAREN) 1 % GEL Apply 1 application topically 4 (four) times daily as needed (arthritis pain.).    Marland Kitchen. EPINEPHrine 0.3 mg/0.3 mL IJ  SOAJ injection Inject 0.3 mg into the muscle as needed (allergic reaction).    Marland Kitchen. esomeprazole (NEXIUM) 40 MG capsule TAKE ONE CAPSULE BY MOUTH TWICE DAILY BEFORE A MEAL (Patient taking differently: Take 40 mg by mouth in the morning and at bedtime.) 60 capsule 11  . estrogens, conjugated, (PREMARIN) 0.625 MG tablet Take 0.625 mg by mouth at bedtime.     . Glucosamine-Chondroitin (MOVE FREE PO) Take 1 tablet by mouth every evening.     . hydrALAZINE (APRESOLINE) 25 MG tablet Take 25 mg by mouth daily.    . hydrOXYzine (ATARAX/VISTARIL) 25 MG tablet Take 25 mg by mouth at bedtime.     Marland Kitchen. ketoconazole (NIZORAL) 2 % cream Apply 1 application topically 2 (two) times daily as needed (skin rash).     Marland Kitchen. L-Methylfolate-B6-B12 (FOLTANX) 3-35-2 MG TABS Take 1 tablet by mouth at bedtime.   0  . lidocaine (XYLOCAINE) 2 % solution USE 15  MLS AS DIRECTED IN MOUTH OR THROAT EVERY 6 HOURS AS NEEDED (INDIGESTION) (lidocain vis/ maalox 1:1) 300 mL 0  . loratadine (CLARITIN) 10 MG tablet Take 10 mg by mouth every evening.     . magnesium citrate SOLN Take 0.5-1 Bottles by mouth as needed for severe constipation.    . montelukast (SINGULAIR) 10 MG tablet Take 10 mg by mouth at bedtime.    . Multiple Vitamin (MULTIVITAMIN WITH MINERALS) TABS tablet Take 1 tablet by mouth every evening. Women's Multivitamin    . Multiple Vitamins-Minerals (HAIR/SKIN/NAILS/BIOTIN PO) Take 2 tablets by mouth every evening. Gummy    . NASACORT ALLERGY 24HR 55 MCG/ACT AERO nasal inhaler Place 1-2 sprays into the nose at bedtime.     . nitroGLYCERIN (NITROSTAT) 0.4 MG SL tablet Place 0.4 mg under the tongue every 5 (five) minutes x 3 doses as needed for chest pain.     . ondansetron (ZOFRAN) 4 MG tablet Take 1 tablet (4 mg total) by mouth every 8 (eight) hours as needed for nausea or vomiting. 60 tablet 3  . rizatriptan (MAXALT) 10 MG tablet Take 10 mg by mouth 2 (two) times daily as needed for migraine.     . simethicone (MYLICON) 125 MG  chewable tablet Chew 250 mg by mouth in the morning and at bedtime.    . tiZANidine (ZANAFLEX) 4 MG tablet Take 4-6 mg by mouth at bedtime.     . traZODone (DESYREL) 50 MG tablet Take 50 mg by mouth at bedtime as needed for sleep.     . vitamin E 1000 UNIT capsule Take 1,000 Units by mouth at bedtime.     . alum & mag hydroxide-simeth (MAALOX/MYLANTA) 200-200-20 MG/5ML suspension Take 10 mLs by mouth 2 (two) times daily.  (Patient not taking: Reported on 01/13/2021)     No current facility-administered medications for this visit.    Allergies as of 01/13/2021 - Review Complete 01/13/2021  Allergen Reaction Noted  . Aspirin Other (See Comments) 07/28/2016  . Avelox [moxifloxacin hcl in nacl] Anaphylaxis and Itching 06/20/2011  . Linzess [linaclotide] Swelling 02/04/2013  . Penicillins Anaphylaxis and Hives   . Adhesive [tape] Other (See Comments) 01/24/2013  . Carbamazepine Other (See Comments) 03/16/2015  . Cephalexin Hives and Itching   . Clindamycin/lincomycin Hives and Itching 12/17/2018  . Codeine Hives and Itching   . Cymbalta [duloxetine hcl] Other (See Comments) 06/20/2011  . Darvon [propoxyphene] Hives 03/17/2015  . Dicyclomine Itching and Other (See Comments) 06/20/2011  . Hydrocodone-acetaminophen Itching 03/15/2017  . Ketek [telithromycin] Hives and Itching 09/06/2017  . Latex Itching 01/24/2013  . Macrolides and ketolides Swelling 07/18/2011  . Meperidine hcl Itching and Other (See Comments)   . Metronidazole Hives and Other (See Comments) 06/20/2011  . Morphine Itching and Other (See Comments)   . Mupirocin Itching 12/17/2018  . Pepcid [famotidine] Nausea And Vomiting 03/16/2015  . Prednisone Hives, Swelling, and Other (See Comments) 06/20/2011  . Propofol Other (See Comments) 03/17/2015  . Protonix [pantoprazole] Swelling 03/17/2015  . Solodyn [minocycline hcl er]  11/21/2019  . Sulfonamide derivatives Hives   . Tramadol hcl Hives and Other (See Comments)   . Zylet  [loteprednol-tobramycin] Hives and Swelling 03/15/2017    Family History  Problem Relation Age of Onset  . Anesthesia problems Sister   . Cancer Sister        breast  . Hypertension Sister   . Parkinson's disease Mother   . Kidney disease Mother   . Diabetes Mother   .   Heart attack Father   . Cancer Father        stomach cancer  . Hypertension Brother   . Diabetes Brother   . Arthritis Brother   . Kidney disease Brother   . Parkinson's disease Brother   . Cancer Maternal Aunt        breast  . Cancer Maternal Grandmother        breast  . Multiple sclerosis Daughter   . Colon cancer Cousin        4 cousins  . Pseudochol deficiency Neg Hx   . Hypotension Neg Hx   . Malignant hyperthermia Neg Hx     Social History   Socioeconomic History  . Marital status: Married    Spouse name: Not on file  . Number of children: Not on file  . Years of education: Not on file  . Highest education level: Not on file  Occupational History  . Not on file  Tobacco Use  . Smoking status: Never Smoker  . Smokeless tobacco: Never Used  Vaping Use  . Vaping Use: Never used  Substance and Sexual Activity  . Alcohol use: Not Currently    Comment: drinks wine "every blue moon"  . Drug use: No  . Sexual activity: Not on file  Other Topics Concern  . Not on file  Social History Narrative  . Not on file   Social Determinants of Health   Financial Resource Strain: Not on file  Food Insecurity: Not on file  Transportation Needs: Not on file  Physical Activity: Not on file  Stress: Not on file  Social Connections: Not on file    Review of Systems: Gen: Denies fever, chills, anorexia. Denies fatigue, weakness, weight loss.  CV: Denies chest pain, palpitations, syncope, peripheral edema, and claudication. Resp: Denies dyspnea at rest, cough, wheezing, coughing up blood, and pleurisy. GI: see HPI Derm: Denies rash, itching, dry skin Psych: Denies depression, anxiety, memory loss,  confusion. No homicidal or suicidal ideation.  Heme: Denies bruising, bleeding, and enlarged lymph nodes.  Physical Exam: BP 127/78   Pulse 72   Temp (!) 96.8 F (36 C) (Temporal)   Ht 5\' 3"  (1.6 m)   Wt 167 lb 6.4 oz (75.9 kg)   BMI 29.65 kg/m  General:   Alert and oriented. No distress noted. Pleasant and cooperative.  Head:  Normocephalic and atraumatic. Eyes:  Conjuctiva clear without scleral icterus. Mouth:  Mask in place Lungs: clear bilaterally Cardiac: S1 S2 present without murmurs Abdomen:  +BS, soft, non-tender and non-distended. No rebound or guarding. No HSM or masses noted. Rectal: tight sphincter, small prolapsing hemorrhoid reducible Msk:  Symmetrical without gross deformities. Normal posture. Extremities:  Without edema. Neurologic:  Alert and  oriented x4 Psych:  Alert and cooperative. Normal mood and affect.  ASSESSMENT: Yesenia Boyd is a 65 y.o. female presenting today with a complicated past GI history, which includes Nissen X 2, history of dysphagia with empiric dilation in Oct 2019, and most recently in Nov 2021 with EGD showing Nissen, benign-appearing stenosis s/p dilation, gastric polyps biospied and normal duodenum. Hyperplastic polyp and reactive gastropathy noted. Chronic constipation also noted, with concerns for rectal pressure and mucus discharge.   Dysphagia: unable to take larger pills. Remaining on soft foods. Continue Nexium.  Nausea: states worsened with constipation. If despite bowel regimen this persists, recommend GES.  Constipation: Linzess with bloating. Will resume [name] 24 mcg po BID as this did well in the past. Although class  side effect of nausea, I have reminded her to take with food to avoid this.   Lower abdominal pain: query related to constipation. No weight loss; in fact, she has had weight gain.   Rectal pressure: DRE unrevealing. Small prolapsing hemorrhoid noted. Mucus discharge likely benign anorectal source such as  hemorrhoids. Desiring colonoscopy. Last in 2018. No overt rectal bleeding.      PLAN:  Proceed with colonoscopy by Dr. Marletta Lor  in near future: the risks, benefits, and alternatives have been discussed with the patient in detail. The patient states understanding and desires to proceed.   Start [name] 24 mcg po BID with food  Consider GES if persistent nausea despite bowel regimen  Continue Nexium  Follow-up after colonoscopy   Gelene Mink, PhD, ANP-BC [facility]

## 2021-01-14 NOTE — Telephone Encounter (Signed)
PA approved. Auth# [id number] DOS 01/26/2021-02/25/2021

## 2021-01-19 ENCOUNTER — Telehealth: Payer: Self-pay | Admitting: Internal Medicine

## 2021-01-19 NOTE — Telephone Encounter (Signed)
Patient called and said that her Amada Jupiter is not covered by her insurance and it needs a prior [place]

## 2021-01-20 NOTE — Progress Notes (Signed)
Cc'ed to pcp °

## 2021-01-21 DIAGNOSIS — F458 Other somatoform disorders: Secondary | ICD-10-CM | POA: Diagnosis not present

## 2021-01-21 DIAGNOSIS — Z6827 Body mass index (BMI) 27.0-27.9, adult: Secondary | ICD-10-CM | POA: Diagnosis not present

## 2021-01-21 DIAGNOSIS — R5382 Chronic fatigue, unspecified: Secondary | ICD-10-CM | POA: Diagnosis not present

## 2021-01-21 DIAGNOSIS — S838X2A Sprain of other specified parts of left knee, initial encounter: Secondary | ICD-10-CM | POA: Diagnosis not present

## 2021-01-21 DIAGNOSIS — K5902 Outlet dysfunction constipation: Secondary | ICD-10-CM | POA: Diagnosis not present

## 2021-01-21 NOTE — Patient Instructions (Signed)
Yesenia Boyd  01/21/2021     @PREFPERIOPPHARMACY @   Your procedure is scheduled on 01/26/2021.  [facility] to [facility] at 6:45 A.M.  Call this number if you have problems the morning of surgery:  [phone]   Remember:  Do not eat or drink after midnight.    Please follow the diet and prep instructions given to you by Dr Queen Blossomarver's office.    Take these medicines the morning of surgery with A SIP OF WATER : Nexium, Claritin and Singulair.  Please use your inhaler before coming to the hospital and bring it with you.      Do not wear jewelry, make-up or nail polish.  Do not wear lotions, powders, or perfumes, or deodorant.  Do not shave 48 hours prior to surgery.  Men may shave face and neck.  Do not bring valuables to the hospital.  [facility] belongings or valuables.  Contacts, dentures or bridgework may not be worn into surgery.  Leave your suitcase in the car.  After surgery it may be brought to your room.  For patients admitted to the hospital, discharge time will be determined by your treatment team.  Patients discharged the day of surgery will not be allowed to drive home.   Name and phone number of your driver:   Family Special instructions:  n/a  Please read over the following fact sheets that you were given. Care and Recovery After Surgery    Colonoscopy, Adult A colonoscopy is a procedure to look at the entire large intestine. This procedure is done using a long, thin, flexible tube that has a camera on the end. You may have a colonoscopy:  As a part of normal colorectal screening.  If you have certain symptoms, such as: ? A low number of red blood cells in your blood (anemia). ? Diarrhea that does not go away. ? Pain in your abdomen. ? Blood in your stool. A colonoscopy can help screen for and diagnose medical problems, including:  Tumors.  Extra tissue that grows where mucus forms (polyps).  Inflammation.  Areas of  bleeding. Tell your health care provider about:  Any allergies you have.  All medicines you are taking, including vitamins, herbs, eye drops, creams, and over-the-counter medicines.  Any problems you or family members have had with anesthetic medicines.  Any blood disorders you have.  Any surgeries you have had.  Any medical conditions you have.  Any problems you have had with having bowel movements.  Whether you are pregnant or may be pregnant. What are the risks? Generally, this is a safe procedure. However, problems may occur, including:  Bleeding.  Damage to your intestine.  Allergic reactions to medicines given during the procedure.  Infection. This is rare. What happens before the procedure? Eating and drinking restrictions Follow instructions from your health care provider about eating or drinking restrictions, which may include:  A few days before the procedure: ? Follow a low-fiber diet. ? Avoid nuts, seeds, dried fruit, raw fruits, and vegetables.  1-3 days before the procedure: ? Eat only gelatin dessert or ice pops. ? Drink only clear liquids, such as water, clear juice, clear broth or bouillon, black coffee or tea, or clear soft drinks or sports drinks. ? Avoid liquids that contain red or purple dye.  The day of the procedure: ? Do not eat solid foods. You may continue to drink clear liquids until up to 2 hours before the procedure. ?  Do not eat or drink anything starting 2 hours before the procedure, or within the time period that your health care provider recommends. Bowel prep If you were prescribed a bowel prep to take by mouth (orally) to clean out your colon:  Take it as told by your health care provider. Starting the day before your procedure, you will need to drink a large amount of liquid medicine. The liquid will cause you to have many bowel movements of loose stool until your stool becomes almost clear or light green.  If your skin or the  opening between the buttocks (anus) gets irritated from diarrhea, you may relieve the irritation using: ? Wipes with medicine in them, such as adult wet wipes with aloe and vitamin E. ? A product to soothe skin, such as petroleum jelly.  If you vomit while drinking the bowel prep: ? Take a break for up to 60 minutes. ? Begin the bowel prep again. ? Call your health care provider if you keep vomiting or you cannot take the bowel prep without vomiting.  To clean out your colon, you may also be given: ? Laxative medicines. These help you have a bowel movement. ? Instructions for enema use. An enema is liquid medicine injected into your rectum. Medicines Ask your health care provider about:  Changing or stopping your regular medicines or supplements. This is especially important if you are taking iron supplements, diabetes medicines, or blood thinners.  Taking medicines such as aspirin and ibuprofen. These medicines can thin your blood. Do not take these medicines unless your health care provider tells you to take them.  Taking over-the-counter medicines, vitamins, herbs, and supplements. General instructions  Ask your health care provider what steps will be taken to help prevent infection. These may include washing skin with a germ-killing soap.  Plan to have someone take you home from the hospital or clinic. What happens during the procedure?  An IV will be inserted into one of your veins.  You may be given one or more of the following: ? A medicine to help you relax (sedative). ? A medicine to numb the area (local anesthetic). ? A medicine to make you fall asleep (general anesthetic). This is rarely needed.  You will lie on your side with your knees bent.  The tube will: ? Have oil or gel put on it (be lubricated). ? Be inserted into your anus. ? Be gently eased through all parts of your large intestine.  Air will be sent into your colon to keep it open. This may cause some  pressure or cramping.  Images will be taken with the camera and will appear on a screen.  A small tissue sample may be removed to be looked at under a microscope (biopsy). The tissue may be sent to a lab for testing if any signs of problems are found.  If small polyps are found, they may be removed and checked for cancer cells.  When the procedure is finished, the tube will be removed. The procedure may vary among health care providers and hospitals.   What happens after the procedure?  Your blood pressure, heart rate, breathing rate, and blood oxygen level will be monitored until you leave the hospital or clinic.  You may have a small amount of blood in your stool.  You may pass gas and have mild cramping or bloating in your abdomen. This is caused by the air that was used to open your colon during the exam.  Do  not drive for 24 hours after the procedure.  It is up to you to get the results of your procedure. Ask your health care provider, or the department that is doing the procedure, when your results will be ready. Summary  A colonoscopy is a procedure to look at the entire large intestine.  Follow instructions from your health care provider about eating and drinking before the procedure.  If you were prescribed an oral bowel prep to clean out your colon, take it as told by your health care provider.  During the colonoscopy, a flexible tube with a camera on its end is inserted into the anus and then passed into the other parts of the large intestine. This information is not intended to replace advice given to you by your health care provider. Make sure you discuss any questions you have with your health care provider. Document Revised: 05/17/2019 Document Reviewed: 05/17/2019 Elsevier Patient Education  2021 [employer]    Monitored Anesthesia Care Anesthesia refers to techniques, procedures, and medicines that help a person stay safe and comfortable during a medical or  dental procedure. Monitored anesthesia care, or sedation, is one type of anesthesia. Your anesthesia specialist may recommend sedation if you will be having a procedure that does not require you to be unconscious. You may have this procedure for:  Cataract surgery.  A dental procedure.  A biopsy.  A colonoscopy. During the procedure, you may receive a medicine to help you relax (sedative). There are three levels of sedation:  Mild sedation. At this level, you may feel awake and relaxed. You will be able to follow directions.  Moderate sedation. At this level, you will be sleepy. You may not remember the procedure.  Deep sedation. At this level, you will be asleep. You will not remember the procedure. The more medicine you are given, the deeper your level of sedation will be. Depending on how you respond to the procedure, the anesthesia specialist may change your level of sedation or the type of anesthesia to fit your needs. An anesthesia specialist will monitor you closely during the procedure. Tell a health care provider about:  Any allergies you have.  All medicines you are taking, including vitamins, herbs, eye drops, creams, and over-the-counter medicines.  Any problems you or family members have had with anesthetic medicines.  Any blood disorders you have.  Any surgeries you have had.  Any medical conditions you have, such as sleep apnea.  Whether you are pregnant or may be pregnant.  Whether you use cigarettes, alcohol, or drugs.  Any use of steroids, whether by mouth or as a cream. What are the risks? Generally, this is a safe procedure. However, problems may occur, including:  Getting too much medicine (oversedation).  Nausea.  Allergic reaction to medicines.  Trouble breathing. If this happens, a breathing tube may be used to help with breathing. It will be removed when you are awake and breathing on your own.  Heart trouble.  Lung trouble.  Confusion that  gets better with time (emergence delirium). What happens before the procedure? Staying hydrated Follow instructions from your health care provider about hydration, which may include:  Up to 2 hours before the procedure - you may continue to drink clear liquids, such as water, clear fruit juice, black coffee, and plain tea. Eating and drinking restrictions Follow instructions from your health care provider about eating and drinking, which may include:  8 hours before the procedure - stop eating heavy meals or foods,  such as meat, fried foods, or fatty foods.  6 hours before the procedure - stop eating light meals or foods, such as toast or cereal.  6 hours before the procedure - stop drinking milk or drinks that contain milk.  2 hours before the procedure - stop drinking clear liquids. Medicines Ask your health care provider about:  Changing or stopping your regular medicines. This is especially important if you are taking diabetes medicines or blood thinners.  Taking medicines such as aspirin and ibuprofen. These medicines can thin your blood. Do not take these medicines unless your health care provider tells you to take them.  Taking over-the-counter medicines, vitamins, herbs, and supplements. Tests and exams  You will have a physical exam.  You may have blood tests done to show: ? How well your kidneys and liver are working. ? How well your blood can clot. General instructions  Plan to have a responsible adult take you home from the hospital or clinic.  If you will be going home right after the procedure, plan to have a responsible adult care for you for the time you are told. This is important. What happens during the procedure?  Your blood pressure, heart rate, breathing, level of pain, and overall condition will be monitored.  An IV will be inserted into one of your veins.  You will be given medicines as needed to keep you comfortable during the procedure. This may  mean changing the level of sedation. ? Depending on your age or the procedure, the sedative may be given:  As a pill that you will swallow or as a pill that is inserted into the rectum.  As an injection into the vein or muscle.  As a spray through the nose.  The procedure will be performed.  Your breathing, heart rate, and blood pressure will be monitored during the procedure.  When the procedure is over, the medicine will be stopped. The procedure may vary among health care providers and hospitals.   What happens after the procedure?  Your blood pressure, heart rate, breathing rate, and blood oxygen level will be monitored until you leave the hospital or clinic.  You may feel sleepy, clumsy, or nauseous.  You may feel forgetful about what happened after the procedure.  You may vomit.  You may continue to get IV fluids.  Do not drive or operate machinery until your health care provider says that it is safe. Summary  Monitored anesthesia care is used to keep a patient comfortable during short procedures.  Tell your health care provider about any allergies or health conditions you have and about all the medicines you are taking.  Before the procedure, follow instructions about when to stop eating and drinking and about changing or stopping any medicines.  Your blood pressure, heart rate, breathing rate, and blood oxygen level will be monitored until you leave the hospital or clinic.  Plan to have a responsible adult take you home from the hospital or clinic. This information is not intended to replace advice given to you by your health care provider. Make sure you discuss any questions you have with your health care provider. Document Revised: 07/09/2020 Document Reviewed: 09/26/2019 Elsevier Patient Education  2021 [employer].

## 2021-01-22 ENCOUNTER — Other Ambulatory Visit (HOSPITAL_COMMUNITY)
Admission: RE | Admit: 2021-01-22 | Discharge: 2021-01-22 | Disposition: A | Discharge status: Home / Self Care | Payer: PRIVATE HEALTH INSURANCE | Source: Ambulatory Visit | Attending: Internal Medicine | Admitting: Internal Medicine

## 2021-01-22 ENCOUNTER — Other Ambulatory Visit: Payer: Self-pay

## 2021-01-22 ENCOUNTER — Encounter (HOSPITAL_COMMUNITY): Payer: Self-pay

## 2021-01-22 ENCOUNTER — Encounter (HOSPITAL_COMMUNITY)
Admission: RE | Admit: 2021-01-22 | Discharge: 2021-01-22 | Disposition: A | Discharge status: Home / Self Care | Payer: Medicare HMO | Source: Ambulatory Visit | Attending: Internal Medicine | Admitting: Internal Medicine

## 2021-01-22 DIAGNOSIS — Z01812 Encounter for preprocedural laboratory examination: Secondary | ICD-10-CM | POA: Insufficient documentation

## 2021-01-22 DIAGNOSIS — Z20822 Contact with and (suspected) exposure to covid-19: Secondary | ICD-10-CM | POA: Insufficient documentation

## 2021-01-22 HISTORY — DX: Unspecified osteoarthritis, unspecified site: M19.90

## 2021-01-22 HISTORY — DX: Sciatica, unspecified side: M54.30

## 2021-01-22 LAB — SARS CORONAVIRUS 2 (TAT 6-24 HRS): SARS Coronavirus 2: NEGATIVE

## 2021-01-25 ENCOUNTER — Telehealth: Payer: Self-pay

## 2021-01-25 NOTE — Telephone Encounter (Signed)
PA approval for Lubiprostone 24 mcg capsule. Authorization good until 11/06/2021. Will advise the pt as well as contact [facility] in [place], [place]. Will give to Darl Pikes to scan into the chart.

## 2021-01-25 NOTE — Telephone Encounter (Signed)
Prior authorization done today and ov sent along for PA for [name]

## 2021-01-26 ENCOUNTER — Ambulatory Visit (HOSPITAL_COMMUNITY)
Admission: RE | Admit: 2021-01-26 | Discharge: 2021-01-26 | Disposition: A | Discharge status: Home / Self Care | Payer: Medicare HMO | Attending: Internal Medicine | Admitting: Internal Medicine

## 2021-01-26 ENCOUNTER — Ambulatory Visit (HOSPITAL_COMMUNITY): Payer: Medicare HMO | Admitting: Anesthesiology

## 2021-01-26 ENCOUNTER — Encounter (HOSPITAL_COMMUNITY): Payer: Self-pay

## 2021-01-26 ENCOUNTER — Encounter (HOSPITAL_COMMUNITY)
Admission: RE | Disposition: A | Discharge status: Home / Self Care | Payer: Self-pay | Source: Home / Self Care | Attending: Internal Medicine

## 2021-01-26 DIAGNOSIS — Z888 Allergy status to other drugs, medicaments and biological substances status: Secondary | ICD-10-CM | POA: Insufficient documentation

## 2021-01-26 DIAGNOSIS — Z9049 Acquired absence of other specified parts of digestive tract: Secondary | ICD-10-CM | POA: Diagnosis not present

## 2021-01-26 DIAGNOSIS — R569 Unspecified convulsions: Secondary | ICD-10-CM | POA: Insufficient documentation

## 2021-01-26 DIAGNOSIS — Z8249 Family history of ischemic heart disease and other diseases of the circulatory system: Secondary | ICD-10-CM | POA: Diagnosis not present

## 2021-01-26 DIAGNOSIS — Z880 Allergy status to penicillin: Secondary | ICD-10-CM | POA: Insufficient documentation

## 2021-01-26 DIAGNOSIS — K648 Other hemorrhoids: Secondary | ICD-10-CM | POA: Diagnosis not present

## 2021-01-26 DIAGNOSIS — Z91040 Latex allergy status: Secondary | ICD-10-CM | POA: Diagnosis not present

## 2021-01-26 DIAGNOSIS — R1310 Dysphagia, unspecified: Secondary | ICD-10-CM | POA: Insufficient documentation

## 2021-01-26 DIAGNOSIS — Z886 Allergy status to analgesic agent status: Secondary | ICD-10-CM | POA: Insufficient documentation

## 2021-01-26 DIAGNOSIS — K6289 Other specified diseases of anus and rectum: Secondary | ICD-10-CM | POA: Diagnosis not present

## 2021-01-26 DIAGNOSIS — Z882 Allergy status to sulfonamides status: Secondary | ICD-10-CM | POA: Diagnosis not present

## 2021-01-26 DIAGNOSIS — Z8673 Personal history of transient ischemic attack (TIA), and cerebral infarction without residual deficits: Secondary | ICD-10-CM | POA: Insufficient documentation

## 2021-01-26 DIAGNOSIS — Z885 Allergy status to narcotic agent status: Secondary | ICD-10-CM | POA: Diagnosis not present

## 2021-01-26 DIAGNOSIS — G7000 Myasthenia gravis without (acute) exacerbation: Secondary | ICD-10-CM | POA: Diagnosis not present

## 2021-01-26 DIAGNOSIS — Z79899 Other long term (current) drug therapy: Secondary | ICD-10-CM | POA: Diagnosis not present

## 2021-01-26 DIAGNOSIS — K581 Irritable bowel syndrome with constipation: Secondary | ICD-10-CM | POA: Diagnosis not present

## 2021-01-26 DIAGNOSIS — Z881 Allergy status to other antibiotic agents status: Secondary | ICD-10-CM | POA: Diagnosis not present

## 2021-01-26 DIAGNOSIS — G4730 Sleep apnea, unspecified: Secondary | ICD-10-CM | POA: Diagnosis not present

## 2021-01-26 HISTORY — PX: COLONOSCOPY WITH PROPOFOL: SHX5780

## 2021-01-26 SURGERY — COLONOSCOPY WITH PROPOFOL
Anesthesia: General

## 2021-01-26 MED ORDER — STERILE WATER FOR IRRIGATION IR SOLN
Status: DC | PRN
  Administered 2021-01-26: 200 mL

## 2021-01-26 MED ORDER — LACTATED RINGERS IV SOLN: INTRAVENOUS | Status: DC | PRN

## 2021-01-26 MED ORDER — LIDOCAINE HCL (CARDIAC) PF 100 MG/5ML IV SOSY
PREFILLED_SYRINGE | INTRAVENOUS | Status: DC | PRN
  Administered 2021-01-26: 50 mg via INTRAVENOUS

## 2021-01-26 MED ORDER — PROPOFOL 10 MG/ML IV BOLUS
INTRAVENOUS | Status: DC | PRN
  Administered 2021-01-26: 30 mg via INTRAVENOUS
  Administered 2021-01-26: 130 mg via INTRAVENOUS
  Administered 2021-01-26: 70 mg via INTRAVENOUS

## 2021-01-26 NOTE — Anesthesia Preprocedure Evaluation (Signed)
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    Pulmonary asthma , sleep apnea ,    Pulmonary exam normal breath sounds clear to auscultation       Cardiovascular Exercise Tolerance: Good negative cardio ROS   Rhythm:regular Rate:Normal     Neuro/Psych Seizures -, Well Controlled,  PSYCHIATRIC DISORDERS Depression TIA Neuromuscular disease    GI/Hepatic Neg liver ROS, hiatal hernia, GERD  Medicated,  Endo/Other  negative endocrine ROS  Renal/GU negative Renal ROS  negative genitourinary   Musculoskeletal  (+) Arthritis , Fibromyalgia -  Abdominal   Peds  Hematology negative hematology ROS (+)   Anesthesia Other Findings Discussed propofol "allergy" with pt.  Tolerated propofol well in 2019.  Will use it today.  Reproductive/Obstetrics negative OB ROS                             Anesthesia Physical  Anesthesia Plan  ASA: III  Anesthesia Plan: General   Post-op Pain Management:    Induction:   PONV Risk Score and Plan: Propofol infusion  Airway Management Planned:   Additional Equipment:   Intra-op Plan:   Post-operative Plan:   Informed Consent: I have reviewed the patients History and Physical, chart, labs and discussed the procedure including the risks, benefits and alternatives for the proposed anesthesia with the patient or authorized representative who has indicated his/her understanding and acceptance.     Dental Advisory Given  Plan Discussed with: CRNA  Anesthesia Plan Comments:          Anesthesia Quick Evaluation

## 2021-01-26 NOTE — Op Note (Signed)
[facility] Patient Name: Yesenia Boyd Procedure Date: 01/26/2021 8:37 AM MRN: [id number] Date of Birth: [birth year] Attending MD: Elon Alas. Abbey Chatters DO CSN: [id number] Age: 65 Admit Type: Outpatient Procedure:                Colonoscopy Indications:              Rectal pain Providers:                Elon Alas. Abbey Chatters, DO, Crystal Page, Randa Spike, Technician Referring MD:              Medicines:                See the Anesthesia note for documentation of the                            administered medications Complications:            No immediate complications. Estimated Blood Loss:     Estimated blood loss: none. Procedure:                Pre-Anesthesia Assessment:                           - The anesthesia plan was to use monitored                            anesthesia care (MAC).                           After obtaining informed consent, the colonoscope                            was passed under direct vision. Throughout the                            procedure, the patient's blood pressure, pulse, and                            oxygen saturations were monitored continuously. The                            [id number]-[id number] ([id number]) scope was introduced through                            the anus and advanced to the the cecum, identified                            by appendiceal orifice and ileocecal valve. The                            colonoscopy was performed without difficulty. The                            patient tolerated the procedure well. The quality  of the bowel preparation was evaluated using the                            BBPS [facility] Bowel Preparation Scale) with scores                            of: Right Colon = 2 (minor amount of residual                            staining, small fragments of stool and/or opaque                            liquid, but mucosa seen well), Transverse Colon = 3                             (entire mucosa seen well with no residual staining,                            small fragments of stool or opaque liquid) and Left                            Colon = 3 (entire mucosa seen well with no residual                            staining, small fragments of stool or opaque                            liquid). The total BBPS score equals 8. The quality                            of the bowel preparation was good. Scope In: 8:52:55 AM Scope Out: 9:03:06 AM Scope Withdrawal Time: 0 hours 6 minutes 27 seconds  Total Procedure Duration: 0 hours 10 minutes 11 seconds  Findings:      The perianal and digital rectal examinations were normal.      Internal hemorrhoids were found during endoscopy.      The terminal ileum appeared normal. Impression:               - Internal hemorrhoids.                           - The examination was otherwise normal.                           - No specimens collected. Moderate Sedation:      Per Anesthesia Care Recommendation:           - Patient has a contact number available for                            emergencies. The signs and symptoms of potential                            delayed complications were discussed with the  patient. Return to normal activities tomorrow.                            Written discharge instructions were provided to the                            patient.                           - Resume previous diet.                           - Continue present medications.                           - Repeat colonoscopy in 10 years for screening                            purposes.                           - Return to GI clinic in 3 months. Procedure Code(s):        --- Professional ---                           [phone], Colonoscopy, flexible; diagnostic, including                            collection of specimen(s) by brushing or washing,                            when performed (separate  procedure) Diagnosis Code(s):        --- Professional ---                           K64.8, Other hemorrhoids                           K62.89, Other specified diseases of anus and rectum CPT copyright 2019 American Medical Association. All rights reserved. The codes documented in this report are preliminary and upon coder review may  be revised to meet current compliance requirements. Elon Alas. Abbey Chatters, DO [place] Abbey Chatters, DO 01/26/2021 9:06:28 AM This report has been signed electronically. Number of Addenda: 0

## 2021-01-26 NOTE — Anesthesia Postprocedure Evaluation (Signed)
Anesthesia Post Note  Patient: Yesenia Boyd  Procedure(s) Performed: COLONOSCOPY WITH PROPOFOL (N/A )  Patient location during evaluation: PACU Anesthesia Type: General Level of consciousness: awake Pain management: pain level controlled Vital Signs Assessment: post-procedure vital signs reviewed and stable Respiratory status: spontaneous breathing and respiratory function stable Cardiovascular status: blood pressure returned to baseline and stable Postop Assessment: no headache and no apparent nausea or vomiting Anesthetic complications: no   No complications documented.   Last Vitals:  Vitals:   01/26/21 0814 01/26/21 0909  BP: (!) 144/72 127/70  Pulse: 76 70  Resp: 16 18  Temp: 36.5 C 36.5 C  SpO2: 100% 100%    Last Pain:  Vitals:   01/26/21 0909  TempSrc: Oral  PainSc: 0-No pain                 Windell Norfolk

## 2021-01-26 NOTE — Anesthesia Procedure Notes (Signed)
Date/Time: 01/26/2021 8:54 AM Performed by: Julian Reil, CRNA Pre-anesthesia Checklist: Patient identified, Emergency Drugs available, Suction available and Patient being monitored Patient Re-evaluated:Patient Re-evaluated prior to induction Oxygen Delivery Method: Nasal cannula Induction Type: IV induction Placement Confirmation: positive ETCO2

## 2021-01-26 NOTE — Discharge Instructions (Signed)
  Colonoscopy Discharge Instructions  Read the instructions outlined below and refer to this sheet in the next few weeks. These discharge instructions provide you with general information on caring for yourself after you leave the hospital. Your doctor may also give you specific instructions. While your treatment has been planned according to the most current medical practices available, unavoidable complications occasionally occur.   ACTIVITY  You may resume your regular activity, but move at a slower pace for the next 24 hours.   Take frequent rest periods for the next 24 hours.   Walking will help get rid of the air and reduce the bloated feeling in your belly (abdomen).   No driving for 24 hours (because of the medicine (anesthesia) used during the test).    Do not sign any important legal documents or operate any machinery for 24 hours (because of the anesthesia used during the test).  NUTRITION  Drink plenty of fluids.   You may resume your normal diet as instructed by your doctor.   Begin with a light meal and progress to your normal diet. Heavy or fried foods are harder to digest and may make you feel sick to your stomach (nauseated).   Avoid alcoholic beverages for 24 hours or as instructed.  MEDICATIONS  You may resume your normal medications unless your doctor tells you otherwise.  WHAT YOU CAN EXPECT TODAY  Some feelings of bloating in the abdomen.   Passage of more gas than usual.   Spotting of blood in your stool or on the toilet paper.  IF YOU HAD POLYPS REMOVED DURING THE COLONOSCOPY:  No aspirin products for 7 days or as instructed.   No alcohol for 7 days or as instructed.   Eat a soft diet for the next 24 hours.  FINDING OUT THE RESULTS OF YOUR TEST Not all test results are available during your visit. If your test results are not back during the visit, make an appointment with your caregiver to find out the results. Do not assume everything is normal if  you have not heard from your caregiver or the medical facility. It is important for you to follow up on all of your test results.  SEEK IMMEDIATE MEDICAL ATTENTION IF:  You have more than a spotting of blood in your stool.   Your belly is swollen (abdominal distention).   You are nauseated or vomiting.   You have a temperature over 101.   You have abdominal pain or discomfort that is severe or gets worse throughout the day.   Your colonoscopy was relatively unremarkable.  I did not find any polyps or evidence of colon cancer.  You did not have any inflammation in your colon consistent with underlying inflammatory bowel disease such as ulcerative colitis or Crohn's disease.  Recommend repeating colonoscopy in 10 years for screening purposes.  Follow-up with GI in 3 months.  I hope you have a great rest of your week!  Hennie Duos. Marletta Lor, D.O. Gastroenterology and Hepatology [facility]

## 2021-01-26 NOTE — Interval H&P Note (Signed)
History and Physical Interval Note:  01/26/2021 8:22 AM  Yesenia Boyd  has presented today for surgery, with the diagnosis of rectal pressure/mucus.  The various methods of treatment have been discussed with the patient and family. After consideration of risks, benefits and other options for treatment, the patient has consented to  Procedure(s) with comments: COLONOSCOPY WITH PROPOFOL (N/A) - am appt as a surgical intervention.  The patient's history has been reviewed, patient examined, no change in status, stable for surgery.  I have reviewed the patient's chart and labs.  Questions were answered to the patient's satisfaction.     Lanelle Bal

## 2021-01-26 NOTE — Transfer of Care (Signed)
Immediate Anesthesia Transfer of Care Note  Patient: Yesenia Boyd  Procedure(s) Performed: COLONOSCOPY WITH PROPOFOL (N/A )  Patient Location: Short Stay  Anesthesia Type:General  Level of Consciousness: awake and alert   Airway & Oxygen Therapy: Patient Spontanous Breathing  Post-op Assessment: Report given to RN, Post -op Vital signs reviewed and stable and Patient moving all extremities X 4  Post vital signs: Reviewed and stable  Last Vitals:  Vitals Value Taken Time  BP    Temp    Pulse    Resp    SpO2      Last Pain:  Vitals:   01/26/21 0847  TempSrc:   PainSc: 8          Complications: No complications documented.

## 2021-02-03 ENCOUNTER — Encounter (HOSPITAL_COMMUNITY): Payer: Self-pay | Admitting: Internal Medicine

## 2021-02-22 DIAGNOSIS — R5382 Chronic fatigue, unspecified: Secondary | ICD-10-CM | POA: Diagnosis not present

## 2021-02-22 DIAGNOSIS — J45909 Unspecified asthma, uncomplicated: Secondary | ICD-10-CM | POA: Diagnosis not present

## 2021-02-22 DIAGNOSIS — K5902 Outlet dysfunction constipation: Secondary | ICD-10-CM | POA: Diagnosis not present

## 2021-02-22 DIAGNOSIS — Z6827 Body mass index (BMI) 27.0-27.9, adult: Secondary | ICD-10-CM | POA: Diagnosis not present

## 2021-02-22 DIAGNOSIS — S838X2A Sprain of other specified parts of left knee, initial encounter: Secondary | ICD-10-CM | POA: Diagnosis not present

## 2021-02-22 DIAGNOSIS — F458 Other somatoform disorders: Secondary | ICD-10-CM | POA: Diagnosis not present

## 2021-02-22 DIAGNOSIS — I872 Venous insufficiency (chronic) (peripheral): Secondary | ICD-10-CM | POA: Diagnosis not present

## 2021-03-23 DIAGNOSIS — I872 Venous insufficiency (chronic) (peripheral): Secondary | ICD-10-CM | POA: Diagnosis not present

## 2021-03-23 DIAGNOSIS — Z6828 Body mass index (BMI) 28.0-28.9, adult: Secondary | ICD-10-CM | POA: Diagnosis not present

## 2021-03-23 DIAGNOSIS — J45909 Unspecified asthma, uncomplicated: Secondary | ICD-10-CM | POA: Diagnosis not present

## 2021-03-23 DIAGNOSIS — M2570 Osteophyte, unspecified joint: Secondary | ICD-10-CM | POA: Diagnosis not present

## 2021-03-23 DIAGNOSIS — F458 Other somatoform disorders: Secondary | ICD-10-CM | POA: Diagnosis not present

## 2021-03-25 DIAGNOSIS — Z1231 Encounter for screening mammogram for malignant neoplasm of breast: Secondary | ICD-10-CM | POA: Diagnosis not present

## 2021-03-31 DIAGNOSIS — H40053 Ocular hypertension, bilateral: Secondary | ICD-10-CM | POA: Diagnosis not present

## 2021-04-22 DIAGNOSIS — J45909 Unspecified asthma, uncomplicated: Secondary | ICD-10-CM | POA: Diagnosis not present

## 2021-04-22 DIAGNOSIS — Z6828 Body mass index (BMI) 28.0-28.9, adult: Secondary | ICD-10-CM | POA: Diagnosis not present

## 2021-04-22 DIAGNOSIS — M2570 Osteophyte, unspecified joint: Secondary | ICD-10-CM | POA: Diagnosis not present

## 2021-04-22 DIAGNOSIS — I872 Venous insufficiency (chronic) (peripheral): Secondary | ICD-10-CM | POA: Diagnosis not present

## 2021-04-22 DIAGNOSIS — F458 Other somatoform disorders: Secondary | ICD-10-CM | POA: Diagnosis not present

## 2021-04-22 DIAGNOSIS — L309 Dermatitis, unspecified: Secondary | ICD-10-CM | POA: Diagnosis not present

## 2021-04-28 ENCOUNTER — Encounter: Payer: Self-pay | Admitting: Internal Medicine

## 2021-04-28 ENCOUNTER — Ambulatory Visit: Payer: PRIVATE HEALTH INSURANCE | Admitting: Gastroenterology

## 2021-04-28 NOTE — Progress Notes (Deleted)
March 2022: internal hemorrhoids, otherwise normal.

## 2021-05-03 DIAGNOSIS — R0789 Other chest pain: Secondary | ICD-10-CM | POA: Diagnosis not present

## 2021-05-27 DIAGNOSIS — J45909 Unspecified asthma, uncomplicated: Secondary | ICD-10-CM | POA: Diagnosis not present

## 2021-05-27 DIAGNOSIS — M2570 Osteophyte, unspecified joint: Secondary | ICD-10-CM | POA: Diagnosis not present

## 2021-05-27 DIAGNOSIS — F458 Other somatoform disorders: Secondary | ICD-10-CM | POA: Diagnosis not present

## 2021-05-27 DIAGNOSIS — I872 Venous insufficiency (chronic) (peripheral): Secondary | ICD-10-CM | POA: Diagnosis not present

## 2021-05-27 DIAGNOSIS — Z6827 Body mass index (BMI) 27.0-27.9, adult: Secondary | ICD-10-CM | POA: Diagnosis not present

## 2021-07-01 DIAGNOSIS — I872 Venous insufficiency (chronic) (peripheral): Secondary | ICD-10-CM | POA: Diagnosis not present

## 2021-07-01 DIAGNOSIS — F458 Other somatoform disorders: Secondary | ICD-10-CM | POA: Diagnosis not present

## 2021-07-01 DIAGNOSIS — R600 Localized edema: Secondary | ICD-10-CM | POA: Diagnosis not present

## 2021-07-01 DIAGNOSIS — J45909 Unspecified asthma, uncomplicated: Secondary | ICD-10-CM | POA: Diagnosis not present

## 2021-07-01 DIAGNOSIS — M2570 Osteophyte, unspecified joint: Secondary | ICD-10-CM | POA: Diagnosis not present

## 2021-07-01 DIAGNOSIS — Z6827 Body mass index (BMI) 27.0-27.9, adult: Secondary | ICD-10-CM | POA: Diagnosis not present

## 2021-07-14 ENCOUNTER — Ambulatory Visit: Payer: Medicare HMO | Admitting: Internal Medicine

## 2021-07-14 ENCOUNTER — Other Ambulatory Visit: Payer: Self-pay

## 2021-07-14 ENCOUNTER — Encounter: Payer: Self-pay | Admitting: Internal Medicine

## 2021-07-14 VITALS — BP 115/73 | HR 68 | Temp 96.9°F | Ht 63.0 in | Wt 162.2 lb

## 2021-07-14 DIAGNOSIS — R1319 Other dysphagia: Secondary | ICD-10-CM | POA: Diagnosis not present

## 2021-07-14 DIAGNOSIS — K219 Gastro-esophageal reflux disease without esophagitis: Secondary | ICD-10-CM

## 2021-07-14 MED ORDER — ESOMEPRAZOLE MAGNESIUM 40 MG PO CPDR: 40.0000 mg | DELAYED_RELEASE_CAPSULE | Freq: Two times a day (BID) | ORAL | 3 refills | Status: DC

## 2021-07-14 NOTE — Patient Instructions (Signed)
For your constipation and abdominal pain, I would recommend adding on Dulcolax on top of your [name] as needed.  If this is not sufficient then you can use your enema as well.  We may consider trialing you on Trulance in the future instead of the [name].  Continue on Nexium twice daily.  I sent you in a year supply refills today.  Otherwise follow-up in 3 to 4 months.  We will schedule you for an afternoon appointment with me.  It was nice seeing you again today.  Dr. Marletta Lor   At [facility] we value your feedback. You may receive a survey about your visit today. Please share your experience as we strive to create trusting relationships with our patients to provide genuine, compassionate, quality care.  We appreciate your understanding and patience as we review any laboratory studies, imaging, and other diagnostic tests that are ordered as we care for you. Our office policy is 5 business days for review of these results, and any emergent or urgent results are addressed in a timely manner for your best interest. If you do not hear from our office in 1 week, please contact us.   We also encourage the use of MyChart, which contains your medical information for your review as well. If you are not enrolled in this feature, an access code is on this after visit summary for your convenience. Thank you for allowing [place] to be involved in your care.  It was great to see you today!  I hope you have a great rest of your summer!!    Hennie Duos. Marletta Lor, D.O. Gastroenterology and Hepatology [facility]

## 2021-07-21 NOTE — Progress Notes (Signed)
Referring Provider: Toma Deiters, MD Primary Care Physician:  Toma Deiters, MD Primary GI:  Dr. Marletta Lor  Chief Complaint  Patient presents with   Abdominal Pain    Thinks she may have hernia top of belly   Gastroesophageal Reflux   Dysphagia    Throat is dry and feels like it's sticking together    HPI:   Yesenia Boyd is a 65 y.o. female who presents to clinic today for follow-up visit.  She has complicated past GI history including Niesen fundoplication x2, history of chronic dysphagia, constipation.    EGD 10/05/2020 for dysphagia showed esophageal stricture status post dilation with 18 mm balloon with moderate improvement as well as mucosal disruption.  Biopsies of the stomach negative for H. pylori.  Hyperplastic polyp removed from stomach.  Colonoscopy 01/26/2021 for rectal discomfort, unremarkable with recommended 10-year recall.  Today she states she is doing okay.  Continues to have chronic dysphagia that she manages this on her own.  Chronically takes Nexium twice daily, requesting refill today.  Constipation continues to be an issue.  Also with associated abdominal pain.  Currently taking [name] 24 mg twice daily.  Previously tried and failed Linzess.  Past Medical History:  Diagnosis Date   Arthritis    Asthma    Complication of anesthesia    Deep depression following administration of propofol   Depression    Fibromyalgia    GERD (gastroesophageal reflux disease)    History of hiatal hernia    2 surgeries, Lap. Nissen Fundiplication    IBS (irritable bowel syndrome)    Myasthenia gravis    her body mimics the disease   Peripheral neuropathy    S/P colonoscopy    Dr. Linna Darner, internal hemorrhoids, repeat in Sept 2022   S/P endoscopy    Dr. Linna Darner 2008: removal of impacted food bolus, Dr. Linna Darner 2010: moderate gastritis, Sept 2012 with SLF: path with mild gastritis, no definite stricture noted, dilation with Savary 16 mm   Sciatica    Seizures (HCC)     Sleep apnea    TIA (transient ischemic attack) 2012    Past Surgical History:  Procedure Laterality Date   ABDOMINAL HYSTERECTOMY     complete per patient   BALLOON DILATION N/A 10/05/2020   Procedure: BALLOON DILATION;  Surgeon: Lanelle Bal, DO;  [facility]: [facility];  Service: Endoscopy;  Laterality: N/A;   BLADDER SURGERY     stretch bladder opening   CHOLECYSTECTOMY     COLONOSCOPY  07/18/2011   SLF: 1. internal hemorrhoids   COLONOSCOPY WITH PROPOFOL N/A 03/21/2017   Dr. Darrick Penna: Redundant left colon, hemorrhoids, next colonoscopy in 10 years.  Patient reports deep pression after propofol.   COLONOSCOPY WITH PROPOFOL N/A 01/26/2021   Procedure: COLONOSCOPY WITH PROPOFOL;  Surgeon: Lanelle Bal, DO;  [facility]: [facility];  Service: Endoscopy;  Laterality: N/A;  am appt   ESOPHAGEAL MANOMETRY N/A 01/21/2013   esophageal surgery?     2008 [facility]   ESOPHAGOGASTRODUODENOSCOPY  07/18/2011   SLF: 1. Moderate gastritis 2. Dysphagia most likely 2o large food bolus moving through her Nissen, which appears to be intact. Pt has poor denttition and NL BPE 2 years ago. 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   ESOPHAGOGASTRODUODENOSCOPY (EGD) WITH PROPOFOL N/A 08/21/2018   dr. Darrick Penna: mild gastritis, esophagus appeared normal. s/p dilation   ESOPHAGOGASTRODUODENOSCOPY (EGD) WITH PROPOFOL N/A 10/05/2020   Nissen, benign-appearing stenosis s/p  dilation, gastric polyps biospied and normal duodenum. Hyperplastic polyp and reactive gastropathy noted.    EVALUATION UNDER ANESTHESIA WITH HEMORRHOIDECTOMY N/A 03/27/2015   Procedure: EXAM UNDER ANESTHESIA WITH SPHINCTEROTOMY ;  Surgeon: Luretha Murphy, MD;  Location: [facility];  Service: General;  Laterality: N/A;   FLEXIBLE SIGMOIDOSCOPY N/A 12/09/2014   SLF: Rectal pain most likely due to internal and external hemorroids   HAND SURGERY     carpal tumnnel right and removal of cyst left   HEMORRHOID BANDING  N/A 12/09/2014   Procedure: HEMORRHOID BANDING;  Surgeon: West Bali, MD;  Location: [facility];  Service: Endoscopy;  Laterality: N/A;   LAPAROSCOPIC NISSEN FUNDOPLICATION N/A 02/13/2013   Procedure: Redo LAPAROSCOPIC NISSEN FUNDOPLICATION;  Surgeon: Valarie Merino, MD;  Location: [facility];  Service: General;  Laterality: N/A;  Forgut explortation with partial  takedown nissan funliplication from 1994 for wrap torsion and persistant dysphagia   NASAL SEPTOPLASTY W/ TURBINOPLASTY Bilateral 08/03/2016   Procedure: NASAL SEPTOPLASTY WITH TURBINATE REDUCTION;  Surgeon: Newman Pies, MD;  Location: [facility];  Service: ENT;  Laterality: Bilateral;   NECK SURGERY     removal of "knot" from neck   NISSEN FUNDOPLICATION     initially in 1994. redo in 2014   POLYPECTOMY  10/05/2020   Procedure: POLYPECTOMY;  Surgeon: Lanelle Bal, DO;  [facility];  Service: Endoscopy;;   SAVORY DILATION  07/18/2011   Surgeon:Sandi M Fields   SAVORY DILATION  08/21/2018   Procedure: SAVORY DILATION;  Surgeon: West Bali, MD;  [facility];  Service: Endoscopy;;    Current Outpatient Medications  Medication Sig Dispense Refill   acetaminophen (TYLENOL) 650 MG CR tablet Take 1,300 mg by mouth every 8 (eight) hours as needed.     Ascorbic Acid (VITAMIN C GUMMIE PO) Take 2-3 tablets by mouth every evening.     CARAFATE 1 GM/10ML suspension Take 3 g by mouth 2 (two) times daily.     Cholecalciferol (VITAMIN D-3) 125 MCG (5000 UT) TABS Take 10,000 Units by mouth at bedtime.     Coenzyme Q10 (COQ10) 100 MG CAPS Take 100 mg by mouth daily.     diclofenac Sodium (VOLTAREN) 1 % GEL Apply 1 application topically 4 (four) times daily as needed (arthritis pain.).     EPINEPHrine 0.3 mg/0.3 mL IJ SOAJ injection Inject 0.3 mg into the muscle as needed for anaphylaxis.     estrogens, conjugated, (PREMARIN) 0.625 MG tablet Take 0.625 mg by mouth at bedtime.      Glucosamine-Chondroitin (MOVE FREE PO) Take 1  tablet by mouth every evening.      hydrOXYzine (ATARAX/VISTARIL) 25 MG tablet Take 25 mg by mouth at bedtime.      ketoconazole (NIZORAL) 2 % cream Apply 1 application topically 2 (two) times daily as needed (skin rash).      L-Methylfolate-B6-B12 (FOLTANX) 3-35-2 MG TABS Take 1 tablet by mouth at bedtime.     loratadine (CLARITIN) 10 MG tablet Take 10 mg by mouth every evening.     lubiprostone ([name]) 24 MCG capsule Take 1 capsule (24 mcg total) by mouth 2 (two) times daily with a meal. 60 capsule 5   montelukast (SINGULAIR) 10 MG tablet Take 10 mg by mouth at bedtime.     Multiple Vitamin (MULTIVITAMIN WITH MINERALS) TABS tablet Take 1 tablet by mouth every evening. Women's Multivitamin     NASACORT ALLERGY 24HR 55 MCG/ACT AERO nasal inhaler Place 1-2 sprays into the  nose daily as needed (allergies).     nitroGLYCERIN (NITROSTAT) 0.4 MG SL tablet Place 0.4 mg under the tongue every 5 (five) minutes x 3 doses as needed for chest pain.      ondansetron (ZOFRAN) 4 MG tablet Take 1 tablet (4 mg total) by mouth every 8 (eight) hours as needed for nausea or vomiting. 60 tablet 3   rizatriptan (MAXALT) 10 MG tablet Take 10 mg by mouth 2 (two) times daily as needed for migraine.      simethicone (MYLICON) 125 MG chewable tablet Chew 250 mg by mouth every 6 (six) hours as needed for flatulence.     tiZANidine (ZANAFLEX) 4 MG tablet Take 4 mg by mouth at bedtime.     traZODone (DESYREL) 50 MG tablet Take 25-50 mg by mouth at bedtime as needed.     umeclidinium-vilanterol (ANORO ELLIPTA) 62.5-25 MCG/INH AEPB Inhale 1 puff into the lungs daily as needed (shortness of breath).     vitamin E 1000 UNIT capsule Take 1,000 Units by mouth at bedtime.      calcium citrate (CALCITRATE - DOSED IN MG ELEMENTAL CALCIUM) 950 (200 Ca) MG tablet Take 200 mg of elemental calcium by mouth daily. (Patient not taking: Reported on 07/14/2021)     esomeprazole (NEXIUM) 40 MG capsule Take 1 capsule (40 mg total) by mouth 2  (two) times daily before a meal. 180 capsule 3   polyethylene glycol-electrolytes (NULYTELY) 420 g solution As directed (Patient not taking: Reported on 07/14/2021) 4000 mL 0   No current facility-administered medications for this visit.    Allergies as of 07/14/2021 - Review Complete 07/14/2021  Allergen Reaction Noted   Aspirin Rash 07/28/2016   Avelox [moxifloxacin hcl in nacl] Anaphylaxis and Itching 06/20/2011   Linzess [linaclotide] Swelling 02/04/2013   Penicillins Anaphylaxis and Hives    Adhesive [tape] Other (See Comments) 01/24/2013   Carbamazepine Other (See Comments) 03/16/2015   Cephalexin Hives and Itching    Clindamycin/lincomycin Hives and Itching 12/17/2018   Codeine Hives and Itching    Cymbalta [duloxetine hcl] Other (See Comments) 06/20/2011   Darvon [propoxyphene] Hives 03/17/2015   Dicyclomine Itching and Other (See Comments) 06/20/2011   Hydrocodone-acetaminophen Itching 03/15/2017   Ketek [telithromycin] Hives and Itching 09/06/2017   Latex Itching 01/24/2013   Macrolides and ketolides Swelling 07/18/2011   Meperidine hcl Itching and Other (See Comments)    Metronidazole Hives and Other (See Comments) 06/20/2011   Morphine Itching and Other (See Comments)    Mupirocin Itching 12/17/2018   Pepcid [famotidine] Nausea And Vomiting 03/16/2015   Prednisone Hives, Swelling, and Other (See Comments) 06/20/2011   Propofol Other (See Comments) 03/17/2015   Protonix [pantoprazole] Swelling 03/17/2015   Solodyn [minocycline hcl er]  11/21/2019   Sulfonamide derivatives Hives    Tramadol hcl Hives and Other (See Comments)    Zylet [loteprednol-tobramycin] Hives and Swelling 03/15/2017    Family History  Problem Relation Age of Onset   Anesthesia problems Sister    Cancer Sister        breast   Hypertension Sister    Parkinson's disease Mother    Kidney disease Mother    Diabetes Mother    Heart attack Father    Cancer Father        stomach cancer    Hypertension Brother    Diabetes Brother    Arthritis Brother    Kidney disease Brother    Parkinson's disease Brother    Cancer Maternal Aunt  breast   Cancer Maternal Grandmother        breast   Multiple sclerosis Daughter    Colon cancer Cousin        4 cousins   Pseudochol deficiency Neg Hx    Hypotension Neg Hx    Malignant hyperthermia Neg Hx     Social History   Socioeconomic History   Marital status: Married    Spouse name: Not on file   Number of children: Not on file   Years of education: Not on file   Highest education level: Not on file  Occupational History   Not on file  Tobacco Use   Smoking status: Never   Smokeless tobacco: Never  Vaping Use   Vaping Use: Never used  Substance and Sexual Activity   Alcohol use: Not Currently    Comment: drinks wine "every blue moon"   Drug use: No   Sexual activity: Not on file  Other Topics Concern   Not on file  Social History Narrative   Not on file   Social Determinants of Health   Financial Resource Strain: Not on file  Food Insecurity: Not on file  Transportation Needs: Not on file  Physical Activity: Not on file  Stress: Not on file  Social Connections: Not on file    Subjective: Review of Systems  Constitutional:  Negative for chills and fever.  HENT:  Negative for congestion and hearing loss.   Eyes:  Negative for blurred vision and double vision.  Respiratory:  Negative for cough and shortness of breath.   Cardiovascular:  Negative for chest pain and palpitations.  Gastrointestinal:  Positive for constipation and heartburn. Negative for abdominal pain, blood in stool, diarrhea, melena and vomiting.  Genitourinary:  Negative for dysuria and urgency.  Musculoskeletal:  Negative for joint pain and myalgias.  Skin:  Negative for itching and rash.  Neurological:  Negative for dizziness and headaches.  Psychiatric/Behavioral:  Negative for depression. The patient is not nervous/anxious.      Objective: BP 115/73   Pulse 68   Temp (!) 96.9 F (36.1 C) (Temporal)   Ht 5\' 3"  (1.6 m)   Wt 162 lb 3.2 oz (73.6 kg)   BMI 28.73 kg/m  Physical Exam Constitutional:      Appearance: Normal appearance.  HENT:     Head: Normocephalic and atraumatic.  Eyes:     Extraocular Movements: Extraocular movements intact.     Conjunctiva/sclera: Conjunctivae normal.  Cardiovascular:     Rate and Rhythm: Normal rate and regular rhythm.  Pulmonary:     Effort: Pulmonary effort is normal.     Breath sounds: Normal breath sounds.  Abdominal:     General: Bowel sounds are normal.     Palpations: Abdomen is soft.  Musculoskeletal:        General: No swelling. Normal range of motion.     Cervical back: Normal range of motion and neck supple.  Skin:    General: Skin is warm and dry.     Coloration: Skin is not jaundiced.  Neurological:     General: No focal deficit present.     Mental Status: She is alert and oriented to person, place, and time.  Psychiatric:        Mood and Affect: Mood normal.        Behavior: Behavior normal.     Assessment: *Constipation *Chronic GERD *Dysphagia  Plan: Patient's GERD relatively well controlled on Nexium twice daily.  We will continue.  Dysphagia slightly improved after dilation.  We will continue to monitor.  Regards to her constipation, she has trialed and failed Linzess in the past.  Currently taking [name] 24 mg twice daily.  Recommend she add on top Dulcolax as needed.  We can consider trialing her on Trulance in the future though she would like to hold off for now.  Follow-up in 4 months.  07/21/2021 10:40 AM   Disclaimer: This note was dictated with voice recognition software. Similar sounding words can inadvertently be transcribed and may not be corrected upon review.

## 2021-08-04 DIAGNOSIS — J45909 Unspecified asthma, uncomplicated: Secondary | ICD-10-CM | POA: Diagnosis not present

## 2021-08-04 DIAGNOSIS — K5900 Constipation, unspecified: Secondary | ICD-10-CM | POA: Diagnosis not present

## 2021-08-04 DIAGNOSIS — I872 Venous insufficiency (chronic) (peripheral): Secondary | ICD-10-CM | POA: Diagnosis not present

## 2021-08-04 DIAGNOSIS — Z6826 Body mass index (BMI) 26.0-26.9, adult: Secondary | ICD-10-CM | POA: Diagnosis not present

## 2021-08-04 DIAGNOSIS — F458 Other somatoform disorders: Secondary | ICD-10-CM | POA: Diagnosis not present

## 2021-08-06 DIAGNOSIS — J45909 Unspecified asthma, uncomplicated: Secondary | ICD-10-CM | POA: Diagnosis not present

## 2021-08-06 DIAGNOSIS — F458 Other somatoform disorders: Secondary | ICD-10-CM | POA: Diagnosis not present

## 2021-08-06 DIAGNOSIS — K219 Gastro-esophageal reflux disease without esophagitis: Secondary | ICD-10-CM | POA: Diagnosis not present

## 2021-08-06 DIAGNOSIS — M797 Fibromyalgia: Secondary | ICD-10-CM | POA: Diagnosis not present

## 2021-08-24 ENCOUNTER — Ambulatory Visit: Payer: PRIVATE HEALTH INSURANCE | Admitting: Gastroenterology

## 2021-08-25 DIAGNOSIS — Z6828 Body mass index (BMI) 28.0-28.9, adult: Secondary | ICD-10-CM | POA: Diagnosis not present

## 2021-08-25 DIAGNOSIS — R222 Localized swelling, mass and lump, trunk: Secondary | ICD-10-CM | POA: Diagnosis not present

## 2021-08-27 ENCOUNTER — Telehealth: Payer: Self-pay | Admitting: Orthopedic Surgery

## 2021-08-27 NOTE — Telephone Encounter (Signed)
Patient message/call returned in response to voice mail message - patient requested to schedule for foot and ankle problem, possible spurs; I left message to return call with our office hour information.Marland Kitchen

## 2021-09-01 DIAGNOSIS — F458 Other somatoform disorders: Secondary | ICD-10-CM | POA: Diagnosis not present

## 2021-09-01 DIAGNOSIS — J45909 Unspecified asthma, uncomplicated: Secondary | ICD-10-CM | POA: Diagnosis not present

## 2021-09-01 DIAGNOSIS — K5900 Constipation, unspecified: Secondary | ICD-10-CM | POA: Diagnosis not present

## 2021-09-01 DIAGNOSIS — M79671 Pain in right foot: Secondary | ICD-10-CM | POA: Diagnosis not present

## 2021-09-01 DIAGNOSIS — E7849 Other hyperlipidemia: Secondary | ICD-10-CM | POA: Diagnosis not present

## 2021-09-01 DIAGNOSIS — Z6826 Body mass index (BMI) 26.0-26.9, adult: Secondary | ICD-10-CM | POA: Diagnosis not present

## 2021-09-01 DIAGNOSIS — Z0000 Encounter for general adult medical examination without abnormal findings: Secondary | ICD-10-CM | POA: Diagnosis not present

## 2021-09-01 DIAGNOSIS — I872 Venous insufficiency (chronic) (peripheral): Secondary | ICD-10-CM | POA: Diagnosis not present

## 2021-09-02 DIAGNOSIS — Z0389 Encounter for observation for other suspected diseases and conditions ruled out: Secondary | ICD-10-CM | POA: Diagnosis not present

## 2021-09-02 DIAGNOSIS — R222 Localized swelling, mass and lump, trunk: Secondary | ICD-10-CM | POA: Diagnosis not present

## 2021-09-07 ENCOUNTER — Ambulatory Visit: Payer: Medicare HMO | Admitting: Podiatry

## 2021-09-07 ENCOUNTER — Other Ambulatory Visit: Payer: Self-pay

## 2021-09-07 ENCOUNTER — Ambulatory Visit (INDEPENDENT_AMBULATORY_CARE_PROVIDER_SITE_OTHER): Payer: Medicare HMO

## 2021-09-07 DIAGNOSIS — M7750 Other enthesopathy of unspecified foot: Secondary | ICD-10-CM

## 2021-09-07 DIAGNOSIS — M7751 Other enthesopathy of right foot: Secondary | ICD-10-CM

## 2021-09-07 DIAGNOSIS — M7671 Peroneal tendinitis, right leg: Secondary | ICD-10-CM

## 2021-09-07 DIAGNOSIS — M84374A Stress fracture, right foot, initial encounter for fracture: Secondary | ICD-10-CM | POA: Diagnosis not present

## 2021-09-07 NOTE — Progress Notes (Signed)
  Subjective:  Patient ID: Yesenia Boyd, female    DOB: August 27, 1956,  MRN: [id number]  Chief Complaint  Patient presents with   Tendonitis      NP // Right ankle pain     65 y.o. female presents with the above complaint. History confirmed with patient.  She has had pain and swelling in the foot for a few months that has been worsening.  About a month and a half ago in September she noticed quite a bit of pain and bruising in the bottom of the foot which she has a photo of.  No known traumatic event.  She has been treated previously with injections along the inside of her ankle for neuropathy by a doctor in [place]  Objective:  Physical Exam: warm, good capillary refill, no trophic changes or ulcerative lesions, normal DP and PT pulses, and normal sensory exam. Right Foot: She has mild pain in the distal plantar fascia and plantar mid arch as well as pain over the second metatarsal, mild pain with resisted eversion but not on the fifth met base or retromalleolar groove  Radiographs: Multiple views x-ray of the right foot: Medial cortex of proximal second metatarsal base shows some abnormality Assessment:   1. Peroneal tendinitis, right leg   2. Stress fracture of right foot, initial encounter      Plan:  Patient was evaluated and treated and all questions answered.  Unclear what the exact etiology of her pain is possibly stress fracture of the second metatarsal which there is some evidence of on her x-ray, possible rupture of the plantar fascial peroneus longus tendon insertion based on the photo that she showed me.  She does have chronic pain fibromyalgia and neuropathy complicating this as well.  Unfortunately she cannot tolerate anti-inflammatories due to multiple allergies.  Has not been in a boot yet so I recommended immobilization and support in a short CAM boot.  If not improving in 3 to 4 weeks then recommend an MRI  No follow-ups on file.

## 2021-09-21 DIAGNOSIS — R911 Solitary pulmonary nodule: Secondary | ICD-10-CM | POA: Diagnosis not present

## 2021-09-21 DIAGNOSIS — R918 Other nonspecific abnormal finding of lung field: Secondary | ICD-10-CM | POA: Diagnosis not present

## 2021-09-21 DIAGNOSIS — R222 Localized swelling, mass and lump, trunk: Secondary | ICD-10-CM | POA: Diagnosis not present

## 2021-10-04 DIAGNOSIS — F458 Other somatoform disorders: Secondary | ICD-10-CM | POA: Diagnosis not present

## 2021-10-04 DIAGNOSIS — R911 Solitary pulmonary nodule: Secondary | ICD-10-CM | POA: Diagnosis not present

## 2021-10-04 DIAGNOSIS — J101 Influenza due to other identified influenza virus with other respiratory manifestations: Secondary | ICD-10-CM | POA: Diagnosis not present

## 2021-10-04 DIAGNOSIS — Z6826 Body mass index (BMI) 26.0-26.9, adult: Secondary | ICD-10-CM | POA: Diagnosis not present

## 2021-10-04 DIAGNOSIS — Z6828 Body mass index (BMI) 28.0-28.9, adult: Secondary | ICD-10-CM | POA: Diagnosis not present

## 2021-10-05 ENCOUNTER — Ambulatory Visit: Payer: Medicare HMO | Admitting: Podiatry

## 2021-10-05 ENCOUNTER — Other Ambulatory Visit: Payer: Self-pay

## 2021-10-05 DIAGNOSIS — M7671 Peroneal tendinitis, right leg: Secondary | ICD-10-CM

## 2021-10-05 DIAGNOSIS — M84374A Stress fracture, right foot, initial encounter for fracture: Secondary | ICD-10-CM | POA: Diagnosis not present

## 2021-10-05 NOTE — Patient Instructions (Signed)
Wear the boot for 2 more weeks. Begin physical therapy. Return to see me in 6 weeks

## 2021-10-06 DIAGNOSIS — F458 Other somatoform disorders: Secondary | ICD-10-CM | POA: Diagnosis not present

## 2021-10-06 DIAGNOSIS — K219 Gastro-esophageal reflux disease without esophagitis: Secondary | ICD-10-CM | POA: Diagnosis not present

## 2021-10-06 DIAGNOSIS — G7000 Myasthenia gravis without (acute) exacerbation: Secondary | ICD-10-CM | POA: Diagnosis not present

## 2021-10-06 DIAGNOSIS — H5711 Ocular pain, right eye: Secondary | ICD-10-CM | POA: Diagnosis not present

## 2021-10-06 DIAGNOSIS — H40053 Ocular hypertension, bilateral: Secondary | ICD-10-CM | POA: Diagnosis not present

## 2021-10-06 NOTE — Progress Notes (Signed)
  Subjective:  Patient ID: Yesenia Boyd, female    DOB: [birth year],  MRN: [id number]  Chief Complaint  Patient presents with   Tendonitis    4 week follow up    65 y.o. female returns for follow-up in with the above complaint. History confirmed with patient.  She has had some improvement in the CAM boot she has been WBAT.  The CAM boot is in quite poor condition and is falling apart where the straps attached to the boot and she would like another 1  Objective:  Physical Exam: warm, good capillary refill, no trophic changes or ulcerative lesions, normal DP and PT pulses, and normal sensory exam. Right Foot:mild pain in the lateral foot with resisted eversion but not on the fifth met base or retromalleolar groove, overall improved today  Radiographs: Multiple views x-ray of the right foot: Medial cortex of proximal second metatarsal base shows some abnormality Assessment:   1. Peroneal tendinitis, right leg   2. Stress fracture of right foot, initial encounter      Plan:  Patient was evaluated and treated and all questions answered.  She has had some improvement with WBAT in the CAM boot.  I recommended she begin physical therapy for strengthening and conditioning.  She is there is a place with [facility] healthcare in [place] that she can go to.  She will begin therapy there and I gave her a referral for this, and if they are unable to do it then I will send her to outpatient rehab in [place].  I inspected her boot today and appear to be quite defective and the straps are coming apart and they should not be after such a short period of time.  She was dispensed a new boot today as a necessity  Return in about 6 weeks (around 11/16/2021) for re-check peroneal tendinitis.

## 2021-11-03 DIAGNOSIS — F458 Other somatoform disorders: Secondary | ICD-10-CM | POA: Diagnosis not present

## 2021-11-03 DIAGNOSIS — J309 Allergic rhinitis, unspecified: Secondary | ICD-10-CM | POA: Diagnosis not present

## 2021-11-03 DIAGNOSIS — Z6827 Body mass index (BMI) 27.0-27.9, adult: Secondary | ICD-10-CM | POA: Diagnosis not present

## 2021-11-03 DIAGNOSIS — M79671 Pain in right foot: Secondary | ICD-10-CM | POA: Diagnosis not present

## 2021-11-12 ENCOUNTER — Ambulatory Visit (HOSPITAL_COMMUNITY): Payer: PRIVATE HEALTH INSURANCE | Admitting: Physical Therapy

## 2021-11-16 ENCOUNTER — Other Ambulatory Visit: Payer: Self-pay

## 2021-11-16 ENCOUNTER — Ambulatory Visit (INDEPENDENT_AMBULATORY_CARE_PROVIDER_SITE_OTHER): Payer: Medicare HMO | Admitting: Podiatry

## 2021-11-16 DIAGNOSIS — R6889 Other general symptoms and signs: Secondary | ICD-10-CM | POA: Diagnosis not present

## 2021-11-16 DIAGNOSIS — M7671 Peroneal tendinitis, right leg: Secondary | ICD-10-CM | POA: Diagnosis not present

## 2021-11-18 NOTE — Progress Notes (Signed)
°  Subjective:  Patient ID: Yesenia Boyd, female    DOB: 26-Aug-1956,  MRN: [id number]  Chief Complaint  Patient presents with   Foot Pain    for re-check peroneal tendinitis./ Patient stated foot pain is about the same since the last visit , she states she hasn't started PT yet because they couldn't get her in until early January. Pain is mostly around the ankle and at the bottom of her foot     66 y.o. female returns for follow-up in with the above complaint. History confirmed with patient.  Seems to be feeling better.  She is mostly concerned about her toenail at this point.  She did her own therapy at home PT was backed up on scheduling so she did not start it yet.  Objective:  Physical Exam: warm, good capillary refill, no trophic changes or ulcerative lesions, normal DP and PT pulses, and normal sensory exam. Right Foot: Much better today.  She has some nail dystrophy  Radiographs: Multiple views x-ray of the right foot: Medial cortex of proximal second metatarsal base shows some abnormality Assessment:   1. Peroneal tendinitis, right leg       Plan:  Patient was evaluated and treated and all questions answered.  Seems to doing better.  I think she can continue her own home therapy.  She will follow with me as needed for this or other issues.  Return if symptoms worsen or fail to improve.

## 2021-11-30 ENCOUNTER — Encounter: Payer: Self-pay | Admitting: Internal Medicine

## 2021-12-02 DIAGNOSIS — F458 Other somatoform disorders: Secondary | ICD-10-CM | POA: Diagnosis not present

## 2021-12-02 DIAGNOSIS — Z6827 Body mass index (BMI) 27.0-27.9, adult: Secondary | ICD-10-CM | POA: Diagnosis not present

## 2021-12-02 DIAGNOSIS — G5603 Carpal tunnel syndrome, bilateral upper limbs: Secondary | ICD-10-CM | POA: Diagnosis not present

## 2021-12-02 DIAGNOSIS — J309 Allergic rhinitis, unspecified: Secondary | ICD-10-CM | POA: Diagnosis not present

## 2021-12-22 DIAGNOSIS — H524 Presbyopia: Secondary | ICD-10-CM | POA: Diagnosis not present

## 2021-12-23 DIAGNOSIS — H5213 Myopia, bilateral: Secondary | ICD-10-CM | POA: Diagnosis not present

## 2021-12-30 DIAGNOSIS — Z6826 Body mass index (BMI) 26.0-26.9, adult: Secondary | ICD-10-CM | POA: Diagnosis not present

## 2021-12-30 DIAGNOSIS — G5603 Carpal tunnel syndrome, bilateral upper limbs: Secondary | ICD-10-CM | POA: Diagnosis not present

## 2021-12-30 DIAGNOSIS — J309 Allergic rhinitis, unspecified: Secondary | ICD-10-CM | POA: Diagnosis not present

## 2021-12-30 DIAGNOSIS — K219 Gastro-esophageal reflux disease without esophagitis: Secondary | ICD-10-CM | POA: Diagnosis not present

## 2021-12-30 DIAGNOSIS — F458 Other somatoform disorders: Secondary | ICD-10-CM | POA: Diagnosis not present

## 2022-01-03 DIAGNOSIS — R6889 Other general symptoms and signs: Secondary | ICD-10-CM | POA: Diagnosis not present

## 2022-01-03 DIAGNOSIS — H2513 Age-related nuclear cataract, bilateral: Secondary | ICD-10-CM | POA: Diagnosis not present

## 2022-01-03 DIAGNOSIS — H40013 Open angle with borderline findings, low risk, bilateral: Secondary | ICD-10-CM | POA: Diagnosis not present

## 2022-01-19 ENCOUNTER — Encounter: Payer: Self-pay | Admitting: Internal Medicine

## 2022-01-19 ENCOUNTER — Ambulatory Visit: Payer: Medicare HMO | Admitting: Internal Medicine

## 2022-01-19 ENCOUNTER — Other Ambulatory Visit: Payer: Self-pay

## 2022-01-19 VITALS — BP 140/70 | HR 76 | Temp 96.6°F | Ht 63.0 in | Wt 163.0 lb

## 2022-01-19 DIAGNOSIS — K219 Gastro-esophageal reflux disease without esophagitis: Secondary | ICD-10-CM | POA: Diagnosis not present

## 2022-01-19 DIAGNOSIS — R1319 Other dysphagia: Secondary | ICD-10-CM | POA: Diagnosis not present

## 2022-01-19 DIAGNOSIS — K581 Irritable bowel syndrome with constipation: Secondary | ICD-10-CM | POA: Diagnosis not present

## 2022-01-19 DIAGNOSIS — G7000 Myasthenia gravis without (acute) exacerbation: Secondary | ICD-10-CM | POA: Diagnosis not present

## 2022-01-19 DIAGNOSIS — R110 Nausea: Secondary | ICD-10-CM | POA: Insufficient documentation

## 2022-01-19 MED ORDER — ONDANSETRON HCL 4 MG PO TABS: 4.0000 mg | ORAL_TABLET | Freq: Three times a day (TID) | ORAL | 3 refills | Status: DC | PRN

## 2022-01-19 NOTE — Patient Instructions (Signed)
I am happy to hear that you are doing okay. ? ?For your constipation, continue make [name] twice daily.  Agree with adding liquid Dulcolax. ? ?I will refill your ondansetron for intermittent nausea. ? ?Continue on Nexium twice daily. ? ?Follow-up with GI in 6 months or sooner if needed. ? ?It was very nice seeing you again today. ? ?Dr. Abbey Chatters ? ?At [facility] we value your feedback. You may receive a survey about your visit today. Please share your experience as we strive to create trusting relationships with our patients to provide genuine, compassionate, quality care. ? ?We appreciate your understanding and patience as we review any laboratory studies, imaging, and other diagnostic tests that are ordered as we care for you. Our office policy is 5 business days for review of these results, and any emergent or urgent results are addressed in a timely manner for your best interest. If you do not hear from our office in 1 week, please contact us.  ? ?We also encourage the use of MyChart, which contains your medical information for your review as well. If you are not enrolled in this feature, an access code is on this after visit summary for your convenience. Thank you for allowing [place] to be involved in your care. ? ?It was great to see you today!  I hope you have a great rest of your Winter! ? ? ? ?Elon Alas. Abbey Chatters, D.O. ?Gastroenterology and Hepatology ?[facility] ? ?

## 2022-01-19 NOTE — Progress Notes (Signed)
? ? ?Referring Provider: Neale Burly, MD ?Primary Care Physician:  Neale Burly, MD ?Primary GI:  Dr. Abbey Chatters ? ?Chief Complaint  ?Patient presents with  ? Follow-up  ?  Abd pain and gastritis  ? ? ?HPI:   ?Yesenia Boyd is a 66 y.o. female who presents to clinic today for follow-up visit.  She has complicated past GI history including Niesen fundoplication x2, history of chronic dysphagia, constipation.   ? ?EGD 10/05/2020 for dysphagia showed esophageal stricture status post dilation with 18 mm balloon with moderate improvement as well as mucosal disruption.  Biopsies of the stomach negative for H. pylori.  Hyperplastic polyp removed from stomach. ? ?Colonoscopy 01/26/2021 for rectal discomfort, unremarkable with recommended 10-year recall. ? ?Today she states she is doing okay.  Continues to have chronic dysphagia that she manages this on her own.  Chronically takes Nexium twice daily, GERD well-controlled. ? ?Constipation continues to be an issue.  Also with associated abdominal pain.  Currently taking [name] 24 mg twice daily.  Previously tried and failed Linzess.  Recently added on a liquid Dulcolax. ? ?Has intermittent nausea, requesting a refill of her ondansetron. ? ?Past Medical History:  ?Diagnosis Date  ? Arthritis   ? Asthma   ? Complication of anesthesia   ? Deep depression following administration of propofol  ? Depression   ? Fibromyalgia   ? GERD (gastroesophageal reflux disease)   ? History of hiatal hernia   ? 2 surgeries, Lap. Nissen Fundiplication   ? IBS (irritable bowel syndrome)   ? Myasthenia gravis   ? her body mimics the disease  ? Peripheral neuropathy   ? S/P colonoscopy   ? Dr. Rowe Pavy, internal hemorrhoids, repeat in Sept 2022  ? S/P endoscopy   ? Dr. Rowe Pavy 2008: removal of impacted food bolus, Dr. Rowe Pavy 2010: moderate gastritis, Sept 2012 with SLF: path with mild gastritis, no definite stricture noted, dilation with Savary 16 mm  ? Sciatica   ? Seizures ([place])   ? Sleep apnea   ?  TIA (transient ischemic attack) 2012  ? ? ?Past Surgical History:  ?Procedure Laterality Date  ? ABDOMINAL HYSTERECTOMY    ? complete per patient  ? BALLOON DILATION N/A 10/05/2020  ? Procedure: BALLOON DILATION;  Surgeon: [name], DO;  [facility];  Service: Endoscopy;  Laterality: N/A;  ? BLADDER SURGERY    ? stretch bladder opening  ? CHOLECYSTECTOMY    ? COLONOSCOPY  07/18/2011  ? SLF: 1. internal hemorrhoids  ? COLONOSCOPY WITH PROPOFOL N/A 03/21/2017  ? Dr. Oneida Alar: Redundant left colon, hemorrhoids, next colonoscopy in 10 years.  Patient reports deep pression after propofol.  ? COLONOSCOPY WITH PROPOFOL N/A 01/26/2021  ? Procedure: COLONOSCOPY WITH PROPOFOL;  Surgeon: [name], DO;  [facility];  Service: Endoscopy;  Laterality: N/A;  am appt  ? ESOPHAGEAL MANOMETRY N/A 01/21/2013  ? esophageal surgery?    ? 2008 [place]  ? ESOPHAGOGASTRODUODENOSCOPY  07/18/2011  ? SLF: 1. Moderate gastritis 2. Dysphagia most likely 2o large food bolus moving through her Nissen, which appears to be intact. Pt has poor denttition and NL BPE 2 years ago. 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  ? ESOPHAGOGASTRODUODENOSCOPY (EGD) WITH PROPOFOL N/A 08/21/2018  ? dr. Oneida Alar: mild gastritis, esophagus appeared normal. s/p dilation  ? ESOPHAGOGASTRODUODENOSCOPY (EGD) WITH PROPOFOL N/A 10/05/2020  ? Nissen, benign-appearing stenosis s/p dilation, gastric polyps biospied and normal duodenum. Hyperplastic  polyp and reactive gastropathy noted.   ? EVALUATION UNDER ANESTHESIA WITH HEMORRHOIDECTOMY N/A 03/27/2015  ? Procedure: EXAM UNDER ANESTHESIA WITH SPHINCTEROTOMY ;  Surgeon: Johnathan Hausen, MD;  Location: [facility];  Service: General;  Laterality: N/A;  ? FLEXIBLE SIGMOIDOSCOPY N/A 12/09/2014  ? SLF: Rectal pain most likely due to internal and external hemorroids  ? HAND SURGERY    ? carpal tumnnel right and removal of cyst left  ? HEMORRHOID BANDING N/A 12/09/2014  ?  Procedure: HEMORRHOID BANDING;  Surgeon: Danie Binder, MD;  Location: [facility];  Service: Endoscopy;  Laterality: N/A;  ? LAPAROSCOPIC NISSEN FUNDOPLICATION N/A 0000000  ? Procedure: Redo LAPAROSCOPIC NISSEN FUNDOPLICATION;  Surgeon: Pedro Earls, MD;  Location: [facility];  Service: General;  Laterality: N/A;  Forgut explortation with partial  takedown nissan funliplication from [id number] for wrap torsion and persistant dysphagia  ? NASAL SEPTOPLASTY W/ TURBINOPLASTY Bilateral 08/03/2016  ? Procedure: NASAL SEPTOPLASTY WITH TURBINATE REDUCTION;  Surgeon: Leta Baptist, MD;  Location: [place] [facility];  Service: ENT;  Laterality: Bilateral;  ? NECK SURGERY    ? removal of "knot" from neck  ? NISSEN FUNDOPLICATION    ? initially in 1994. redo in 2014  ? POLYPECTOMY  10/05/2020  ? Procedure: POLYPECTOMY;  Surgeon: [name], DO;  [facility];  Service: Endoscopy;;  ? SAVORY DILATION  07/18/2011  ? Surgeon:Sandi M Fields  ? SAVORY DILATION  08/21/2018  ? Procedure: SAVORY DILATION;  Surgeon: Danie Binder, MD;  [facility];  Service: Endoscopy;;  ? ? ?Current Outpatient Medications  ?Medication Sig Dispense Refill  ? acetaminophen (TYLENOL) 650 MG CR tablet Take 1,300 mg by mouth every 8 (eight) hours as needed.    ? Ascorbic Acid (VITAMIN C GUMMIE PO) Take 2-3 tablets by mouth every evening.    ? CARAFATE 1 GM/10ML suspension Take 3 g by mouth 2 (two) times daily.    ? Cholecalciferol (VITAMIN D-3) 125 MCG (5000 UT) TABS Take 10,000 Units by mouth at bedtime.    ? Coenzyme Q10 (COQ10) 100 MG CAPS Take 100 mg by mouth daily.    ? diclofenac Sodium (VOLTAREN) 1 % GEL Apply 1 application topically 4 (four) times daily as needed (arthritis pain.).    ? EPINEPHrine 0.3 mg/0.3 mL IJ SOAJ injection Inject 0.3 mg into the muscle as needed for anaphylaxis.    ? esomeprazole (NEXIUM) 40 MG capsule Take 1 capsule (40 mg total) by mouth 2 (two) times daily before a meal. 180 capsule 3  ? estrogens, conjugated,  (PREMARIN) 0.625 MG tablet Take 0.625 mg by mouth at bedtime.     ? famotidine (PEPCID) 20 MG tablet Take 20 mg by mouth 2 (two) times daily.    ? Glucosamine-Chondroitin (MOVE FREE PO) Take 1 tablet by mouth every evening.     ? hydrOXYzine (ATARAX/VISTARIL) 25 MG tablet Take 25 mg by mouth at bedtime.     ? L-Methylfolate-B6-B12 (FOLTANX) 3-35-2 MG TABS Take 1 tablet by mouth at bedtime.    ? loratadine (CLARITIN) 10 MG tablet Take 10 mg by mouth every evening.    ? lubiprostone ([name]) 24 MCG capsule Take 1 capsule (24 mcg total) by mouth 2 (two) times daily with a meal. 60 capsule 5  ? magnesium hydroxide (DULCOLAX) 400 MG/5ML suspension Take 15 mLs by mouth daily as needed for mild constipation. Takes 15 ml daily, 30 ml if not taking amitiza    ? montelukast (SINGULAIR) 10 MG tablet  Take 10 mg by mouth at bedtime.    ? Multiple Vitamin (MULTIVITAMIN WITH MINERALS) TABS tablet Take 1 tablet by mouth every evening. Women's Multivitamin    ? NASACORT ALLERGY 24HR 55 MCG/ACT AERO nasal inhaler Place 1-2 sprays into the nose daily as needed (allergies).    ? rizatriptan (MAXALT) 10 MG tablet Take 10 mg by mouth 2 (two) times daily as needed for migraine.     ? simethicone (MYLICON) 0000000 MG chewable tablet Chew 250 mg by mouth every 6 (six) hours as needed for flatulence.    ? tiZANidine (ZANAFLEX) 4 MG tablet Take 4 mg by mouth at bedtime.    ? traZODone (DESYREL) 50 MG tablet Take 25-50 mg by mouth at bedtime as needed.    ? umeclidinium-vilanterol (ANORO ELLIPTA) 62.5-25 MCG/INH AEPB Inhale 1 puff into the lungs daily as needed (shortness of breath).    ? vitamin E 1000 UNIT capsule Take 1,000 Units by mouth at bedtime.     ? calcium citrate (CALCITRATE - DOSED IN MG ELEMENTAL CALCIUM) 950 (200 Ca) MG tablet Take 200 mg of elemental calcium by mouth daily. (Patient not taking: Reported on 07/14/2021)    ? ketoconazole (NIZORAL) 2 % cream Apply 1 application topically 2 (two) times daily as needed (skin rash).   (Patient not taking: Reported on 01/19/2022)    ? nitroGLYCERIN (NITROSTAT) 0.4 MG SL tablet Place 0.4 mg under the tongue every 5 (five) minutes x 3 doses as needed for chest pain.  (Patient not taking: Reported

## 2022-01-27 DIAGNOSIS — J309 Allergic rhinitis, unspecified: Secondary | ICD-10-CM | POA: Diagnosis not present

## 2022-01-27 DIAGNOSIS — G5603 Carpal tunnel syndrome, bilateral upper limbs: Secondary | ICD-10-CM | POA: Diagnosis not present

## 2022-01-27 DIAGNOSIS — K219 Gastro-esophageal reflux disease without esophagitis: Secondary | ICD-10-CM | POA: Diagnosis not present

## 2022-01-27 DIAGNOSIS — Z6826 Body mass index (BMI) 26.0-26.9, adult: Secondary | ICD-10-CM | POA: Diagnosis not present

## 2022-01-27 DIAGNOSIS — F458 Other somatoform disorders: Secondary | ICD-10-CM | POA: Diagnosis not present

## 2022-01-27 DIAGNOSIS — R1084 Generalized abdominal pain: Secondary | ICD-10-CM | POA: Diagnosis not present

## 2022-02-24 DIAGNOSIS — E7849 Other hyperlipidemia: Secondary | ICD-10-CM | POA: Diagnosis not present

## 2022-02-24 DIAGNOSIS — R5382 Chronic fatigue, unspecified: Secondary | ICD-10-CM | POA: Diagnosis not present

## 2022-02-24 DIAGNOSIS — K219 Gastro-esophageal reflux disease without esophagitis: Secondary | ICD-10-CM | POA: Diagnosis not present

## 2022-02-24 DIAGNOSIS — M5442 Lumbago with sciatica, left side: Secondary | ICD-10-CM | POA: Diagnosis not present

## 2022-02-24 DIAGNOSIS — F458 Other somatoform disorders: Secondary | ICD-10-CM | POA: Diagnosis not present

## 2022-02-24 DIAGNOSIS — Z6826 Body mass index (BMI) 26.0-26.9, adult: Secondary | ICD-10-CM | POA: Diagnosis not present

## 2022-02-24 DIAGNOSIS — R1084 Generalized abdominal pain: Secondary | ICD-10-CM | POA: Diagnosis not present

## 2022-02-24 DIAGNOSIS — J309 Allergic rhinitis, unspecified: Secondary | ICD-10-CM | POA: Diagnosis not present

## 2022-02-24 DIAGNOSIS — M150 Primary generalized (osteo)arthritis: Secondary | ICD-10-CM | POA: Diagnosis not present

## 2022-02-24 DIAGNOSIS — G5603 Carpal tunnel syndrome, bilateral upper limbs: Secondary | ICD-10-CM | POA: Diagnosis not present

## 2022-02-24 DIAGNOSIS — Z0000 Encounter for general adult medical examination without abnormal findings: Secondary | ICD-10-CM | POA: Diagnosis not present

## 2022-03-24 DIAGNOSIS — Z6826 Body mass index (BMI) 26.0-26.9, adult: Secondary | ICD-10-CM | POA: Diagnosis not present

## 2022-03-24 DIAGNOSIS — Z0000 Encounter for general adult medical examination without abnormal findings: Secondary | ICD-10-CM | POA: Diagnosis not present

## 2022-03-24 DIAGNOSIS — K219 Gastro-esophageal reflux disease without esophagitis: Secondary | ICD-10-CM | POA: Diagnosis not present

## 2022-03-24 DIAGNOSIS — M5442 Lumbago with sciatica, left side: Secondary | ICD-10-CM | POA: Diagnosis not present

## 2022-03-24 DIAGNOSIS — F458 Other somatoform disorders: Secondary | ICD-10-CM | POA: Diagnosis not present

## 2022-03-24 DIAGNOSIS — J309 Allergic rhinitis, unspecified: Secondary | ICD-10-CM | POA: Diagnosis not present

## 2022-03-28 DIAGNOSIS — Z1231 Encounter for screening mammogram for malignant neoplasm of breast: Secondary | ICD-10-CM | POA: Diagnosis not present

## 2022-04-20 DIAGNOSIS — N6489 Other specified disorders of breast: Secondary | ICD-10-CM | POA: Diagnosis not present

## 2022-04-20 DIAGNOSIS — R922 Inconclusive mammogram: Secondary | ICD-10-CM | POA: Diagnosis not present

## 2022-04-20 DIAGNOSIS — R928 Other abnormal and inconclusive findings on diagnostic imaging of breast: Secondary | ICD-10-CM | POA: Diagnosis not present

## 2022-04-28 ENCOUNTER — Other Ambulatory Visit: Payer: Self-pay | Admitting: Gastroenterology

## 2022-04-28 DIAGNOSIS — K219 Gastro-esophageal reflux disease without esophagitis: Secondary | ICD-10-CM | POA: Diagnosis not present

## 2022-04-28 DIAGNOSIS — Z6827 Body mass index (BMI) 27.0-27.9, adult: Secondary | ICD-10-CM | POA: Diagnosis not present

## 2022-04-28 DIAGNOSIS — K5900 Constipation, unspecified: Secondary | ICD-10-CM | POA: Diagnosis not present

## 2022-04-28 DIAGNOSIS — F458 Other somatoform disorders: Secondary | ICD-10-CM | POA: Diagnosis not present

## 2022-04-28 DIAGNOSIS — J309 Allergic rhinitis, unspecified: Secondary | ICD-10-CM | POA: Diagnosis not present

## 2022-05-04 ENCOUNTER — Telehealth: Payer: Self-pay | Admitting: Internal Medicine

## 2022-05-04 NOTE — Telephone Encounter (Signed)
Phoned the pt back and was advised by her that [facility] told her that they faxed a refill for her [name] on last week and this week. I explained to the pt that I had not received anything from them and I just cant refill anything without Dr. Knowing about it. I advised the pt that her [name] would need a PA so I have to follow chain of command. Ask the pt to advise [facility] of this and she advised that "why can't we do it". Will wait to see if [facility] will send it

## 2022-05-04 NOTE — Telephone Encounter (Signed)
Patient called and said that she has been trying to get her amiteza filled for almost 2 weeks.   Please send in a refill to [facility] in [place], she is out of her medication

## 2022-05-04 NOTE — Telephone Encounter (Signed)
Pt called wanting a refill on her [name]. Her last ov was 01/19/2022. Please advise, pt was advised if nothing came from the pharmacy then I would have to wait for you to send in a new script and if it needed a PA she will still have to wait. Please advise

## 2022-05-05 ENCOUNTER — Other Ambulatory Visit: Payer: Self-pay | Admitting: Internal Medicine

## 2022-05-05 MED ORDER — LUBIPROSTONE 24 MCG PO CAPS: 24.0000 ug | ORAL_CAPSULE | Freq: Two times a day (BID) | ORAL | 11 refills | Status: DC

## 2022-05-05 NOTE — Telephone Encounter (Signed)
Refills sent to pharmacy. 

## 2022-05-06 NOTE — Telephone Encounter (Signed)
Phoned and LMOVM that [name] has been sent to her pharmacy

## 2022-05-09 ENCOUNTER — Telehealth: Payer: Self-pay | Admitting: Internal Medicine

## 2022-05-09 ENCOUNTER — Telehealth: Payer: Self-pay

## 2022-05-09 NOTE — Telephone Encounter (Signed)
Phoned and spoke with the pt and advised her we never received anything from [place]. I did a PA and it has been approved. I advised the pt that I was calling [facility] and advised them. I phoned to [facility] and advised them they ran it and it went through and the cost is $95.00. pt is aware

## 2022-05-09 NOTE — Telephone Encounter (Signed)
PA done for [name] 24 mcg. Pt has tried/failed Linzess and Movantik. Dx used k59.1 and k59.00.   I phoned to [facility] in [place] myself and the Rx is there it was needing a PA (which I never received) and they advised they are sending it. Still no documentation from [facility] in [place]. PA was done on Cover My Meds. Waitiing for a response. 

## 2022-05-09 NOTE — Telephone Encounter (Signed)
Patient called and said that [facility] still did not have her prescription and this was "getting ridiculous"

## 2022-05-09 NOTE — Telephone Encounter (Signed)
PA done for [name] 24 mcg. Pt has tried/failed Linzess and Movantik. Dx used k59.1 and k59.00.   I phoned to [facility] in [place] myself and the Rx is there it was needing a PA (which I never received) and they advised they are sending it. Still no documentation from [facility] in [place]. PA was done on Cover My Meds. Waitiing for a response.

## 2022-05-26 DIAGNOSIS — F458 Other somatoform disorders: Secondary | ICD-10-CM | POA: Diagnosis not present

## 2022-05-26 DIAGNOSIS — Z6827 Body mass index (BMI) 27.0-27.9, adult: Secondary | ICD-10-CM | POA: Diagnosis not present

## 2022-05-26 DIAGNOSIS — K219 Gastro-esophageal reflux disease without esophagitis: Secondary | ICD-10-CM | POA: Diagnosis not present

## 2022-05-26 DIAGNOSIS — J309 Allergic rhinitis, unspecified: Secondary | ICD-10-CM | POA: Diagnosis not present

## 2022-05-26 DIAGNOSIS — L279 Dermatitis due to unspecified substance taken internally: Secondary | ICD-10-CM | POA: Diagnosis not present

## 2022-05-26 DIAGNOSIS — K5900 Constipation, unspecified: Secondary | ICD-10-CM | POA: Diagnosis not present

## 2022-06-29 DIAGNOSIS — K219 Gastro-esophageal reflux disease without esophagitis: Secondary | ICD-10-CM | POA: Diagnosis not present

## 2022-06-29 DIAGNOSIS — K648 Other hemorrhoids: Secondary | ICD-10-CM | POA: Diagnosis not present

## 2022-06-29 DIAGNOSIS — Z6827 Body mass index (BMI) 27.0-27.9, adult: Secondary | ICD-10-CM | POA: Diagnosis not present

## 2022-06-30 DIAGNOSIS — L820 Inflamed seborrheic keratosis: Secondary | ICD-10-CM | POA: Diagnosis not present

## 2022-07-15 ENCOUNTER — Ambulatory Visit: Payer: Self-pay

## 2022-07-15 DIAGNOSIS — Z20822 Contact with and (suspected) exposure to covid-19: Secondary | ICD-10-CM | POA: Diagnosis not present

## 2022-07-15 DIAGNOSIS — R079 Chest pain, unspecified: Secondary | ICD-10-CM | POA: Diagnosis not present

## 2022-07-15 DIAGNOSIS — M4722 Other spondylosis with radiculopathy, cervical region: Secondary | ICD-10-CM | POA: Diagnosis not present

## 2022-07-15 DIAGNOSIS — M940 Chondrocostal junction syndrome [Tietze]: Secondary | ICD-10-CM | POA: Diagnosis not present

## 2022-07-15 DIAGNOSIS — R5381 Other malaise: Secondary | ICD-10-CM | POA: Diagnosis not present

## 2022-07-15 DIAGNOSIS — Z886 Allergy status to analgesic agent status: Secondary | ICD-10-CM | POA: Diagnosis not present

## 2022-07-15 DIAGNOSIS — Z885 Allergy status to narcotic agent status: Secondary | ICD-10-CM | POA: Diagnosis not present

## 2022-07-15 DIAGNOSIS — M542 Cervicalgia: Secondary | ICD-10-CM | POA: Diagnosis not present

## 2022-07-15 DIAGNOSIS — M26621 Arthralgia of right temporomandibular joint: Secondary | ICD-10-CM | POA: Diagnosis not present

## 2022-07-15 DIAGNOSIS — Z880 Allergy status to penicillin: Secondary | ICD-10-CM | POA: Diagnosis not present

## 2022-07-15 DIAGNOSIS — Z882 Allergy status to sulfonamides status: Secondary | ICD-10-CM | POA: Diagnosis not present

## 2022-07-15 DIAGNOSIS — Z881 Allergy status to other antibiotic agents status: Secondary | ICD-10-CM | POA: Diagnosis not present

## 2022-07-15 NOTE — Telephone Encounter (Signed)
   Chief Complaint: Right ear ache with pain in jaw, neck Symptoms: Above Frequency: 1 week ago Pertinent Negatives: Patient denies fever Disposition: [] ED /[x] Urgent Care (no appt availability in office) / [] Appointment(In office/virtual)/ []  [place] [facility]/ [] Home Care/ [] Refused Recommended Disposition /[] [place] [facility]/ []  Follow-up with PCP Additional Notes:   Reason for Disposition  [1] SEVERE pain AND [2] not improved 2 hours after taking analgesic medication (e.g., ibuprofen or acetaminophen)  Answer Assessment - Initial Assessment Questions 1. LOCATION: "Which ear is involved?"     Right 2. ONSET: "When did the ear start hurting"       1 week ago 3. SEVERITY: "How bad is the pain?"  (Scale 1-10; mild, moderate or severe)   - MILD (1-3): doesn't interfere with normal activities    - MODERATE (4-7): interferes with normal activities or awakens from sleep    - SEVERE (8-10): excruciating pain, unable to do any normal activities      Severe 4. URI SYMPTOMS: "Do you have a runny nose or cough?"     No 5. FEVER: "Do you have a fever?" If Yes, ask: "What is your temperature, how was it measured, and when did it start?"     No 6. CAUSE: "Have you been swimming recently?", "How often do you use Q-TIPS?", "Have you had any recent air travel or scuba diving?"     No 7. OTHER SYMPTOMS: "Do you have any other symptoms?" (e.g., headache, stiff neck, dizziness, vomiting, runny nose, decreased hearing)     Jaw pain 8. PREGNANCY: "Is there any chance you are pregnant?" "When was your last menstrual period?"     No  Protocols used: 

## 2022-07-18 DIAGNOSIS — M542 Cervicalgia: Secondary | ICD-10-CM | POA: Diagnosis not present

## 2022-07-18 DIAGNOSIS — Z6827 Body mass index (BMI) 27.0-27.9, adult: Secondary | ICD-10-CM | POA: Diagnosis not present

## 2022-08-02 DIAGNOSIS — R1084 Generalized abdominal pain: Secondary | ICD-10-CM | POA: Diagnosis not present

## 2022-08-02 DIAGNOSIS — R109 Unspecified abdominal pain: Secondary | ICD-10-CM | POA: Diagnosis not present

## 2022-08-17 DIAGNOSIS — R109 Unspecified abdominal pain: Secondary | ICD-10-CM | POA: Diagnosis not present

## 2022-08-17 DIAGNOSIS — Z6827 Body mass index (BMI) 27.0-27.9, adult: Secondary | ICD-10-CM | POA: Diagnosis not present

## 2022-09-12 ENCOUNTER — Telehealth: Payer: Self-pay

## 2022-09-12 NOTE — Telephone Encounter (Signed)
Dr. Abbey Boyd pt called had blood in her stool for about one month and a half. She has seen her PCP about 5 or 6 times for this. She has hemorrhoids, tears and bloody stools. Pt needs appt. To be seen with you or a APP that is familiar with her.   Mandy please schedule pt appt to be seen by APP if Dr Yesenia Boyd is booked

## 2022-09-12 NOTE — Telephone Encounter (Signed)
Noted. If pt wants to wait on him that's her decision.

## 2022-09-19 DIAGNOSIS — K219 Gastro-esophageal reflux disease without esophagitis: Secondary | ICD-10-CM | POA: Diagnosis not present

## 2022-09-19 DIAGNOSIS — Z6828 Body mass index (BMI) 28.0-28.9, adult: Secondary | ICD-10-CM | POA: Diagnosis not present

## 2022-09-19 DIAGNOSIS — K648 Other hemorrhoids: Secondary | ICD-10-CM | POA: Diagnosis not present

## 2022-10-05 ENCOUNTER — Telehealth: Payer: Self-pay

## 2022-10-05 NOTE — Telephone Encounter (Signed)
PA done for Nexium. Dx used: K21.9 and R13.19. waiting for a decision from Cover My Meds.

## 2022-10-06 DIAGNOSIS — Z6828 Body mass index (BMI) 28.0-28.9, adult: Secondary | ICD-10-CM | POA: Diagnosis not present

## 2022-10-06 DIAGNOSIS — J301 Allergic rhinitis due to pollen: Secondary | ICD-10-CM | POA: Diagnosis not present

## 2022-10-06 NOTE — Telephone Encounter (Signed)
Pt approved for Nexium DR mg cap. Authorization good until 11/07/2023. Will send to [facility] for scan to pt's chart.

## 2022-10-17 ENCOUNTER — Other Ambulatory Visit: Payer: Self-pay

## 2022-10-17 ENCOUNTER — Other Ambulatory Visit: Payer: Self-pay | Admitting: Internal Medicine

## 2022-10-17 MED ORDER — ESOMEPRAZOLE MAGNESIUM 40 MG PO CPDR: 40.0000 mg | DELAYED_RELEASE_CAPSULE | Freq: Two times a day (BID) | ORAL | 0 refills | Status: DC

## 2022-10-26 DIAGNOSIS — N6012 Diffuse cystic mastopathy of left breast: Secondary | ICD-10-CM | POA: Diagnosis not present

## 2022-10-26 DIAGNOSIS — R922 Inconclusive mammogram: Secondary | ICD-10-CM | POA: Diagnosis not present

## 2022-10-26 DIAGNOSIS — N6489 Other specified disorders of breast: Secondary | ICD-10-CM | POA: Diagnosis not present

## 2022-11-03 DIAGNOSIS — J301 Allergic rhinitis due to pollen: Secondary | ICD-10-CM | POA: Diagnosis not present

## 2022-11-03 DIAGNOSIS — K219 Gastro-esophageal reflux disease without esophagitis: Secondary | ICD-10-CM | POA: Diagnosis not present

## 2022-11-03 DIAGNOSIS — Z6828 Body mass index (BMI) 28.0-28.9, adult: Secondary | ICD-10-CM | POA: Diagnosis not present

## 2022-11-03 DIAGNOSIS — F451 Undifferentiated somatoform disorder: Secondary | ICD-10-CM | POA: Diagnosis not present

## 2022-11-16 ENCOUNTER — Encounter: Payer: Self-pay | Admitting: *Deleted

## 2022-11-16 ENCOUNTER — Ambulatory Visit: Payer: Medicare HMO | Admitting: Internal Medicine

## 2022-11-16 ENCOUNTER — Encounter: Payer: Self-pay | Admitting: Internal Medicine

## 2022-11-16 ENCOUNTER — Telehealth: Payer: Self-pay | Admitting: *Deleted

## 2022-11-16 VITALS — BP 141/82 | HR 76 | Temp 97.7°F | Ht 63.0 in | Wt 170.7 lb

## 2022-11-16 DIAGNOSIS — K581 Irritable bowel syndrome with constipation: Secondary | ICD-10-CM

## 2022-11-16 DIAGNOSIS — K219 Gastro-esophageal reflux disease without esophagitis: Secondary | ICD-10-CM | POA: Diagnosis not present

## 2022-11-16 DIAGNOSIS — K429 Umbilical hernia without obstruction or gangrene: Secondary | ICD-10-CM | POA: Diagnosis not present

## 2022-11-16 DIAGNOSIS — R1319 Other dysphagia: Secondary | ICD-10-CM

## 2022-11-16 NOTE — Patient Instructions (Addendum)
We will schedule you for endoscopy with repeat dilation to help with your difficulty swallowing.  Continue on Nexium twice daily.  Continue on [name] for your chronic constipation.  Appears that you have a small periumbilical hernia.  We can refer you to surgery to have this evaluated if you would like.  Follow-up after procedure.  It was good seeing you again today.  Dr. Abbey Chatters

## 2022-11-16 NOTE — Progress Notes (Signed)
Referring Provider: Neale Burly, MD Primary Care Physician:  Neale Burly, MD Primary GI:  Dr. Abbey Chatters  Chief Complaint  Patient presents with   Follow-up    Patient here today due to issues with dysphagia. Patient states food gets stuck in the epigastric area.She is taking nexium 40 mg bid, she is also taking carafate 1 gm bid.  Patient has also thinks she may have a hiatal hernia. Patient has seen bright red blood in stool recently but none in the last few days.     HPI:   Yesenia Boyd is a 67 y.o. female who presents to clinic today for follow-up visit.  She has complicated past GI history including Niesen fundoplication x2, history of chronic dysphagia, constipation.    EGD 10/05/2020 for dysphagia showed esophageal stricture status post dilation with 18 mm balloon with moderate improvement as well as mucosal disruption.  Biopsies of the stomach negative for H. pylori.  Hyperplastic polyp removed from stomach.  Colonoscopy 01/26/2021 for rectal discomfort, unremarkable with recommended 10-year recall.  Today she states she is doing okay.  States her dysphagia is "getting bad again" believes that she needs repeat EGD with dilation.  Chronically takes Nexium twice daily, GERD well-controlled.  Constipation continues to be an issue.  Also with associated abdominal pain.  Currently taking [name] 24 mg twice daily.  Previously tried and failed Linzess.  Recently added on a liquid Dulcolax.  Also complaining of outpouching of her bellybutton, she is concerned about a hernia.  Occasional discomfort.  Past Medical History:  Diagnosis Date   Arthritis    Asthma    Complication of anesthesia    Deep depression following administration of propofol   Depression    Fibromyalgia    GERD (gastroesophageal reflux disease)    History of hiatal hernia    2 surgeries, Lap. Nissen Fundiplication    IBS (irritable bowel syndrome)    Myasthenia gravis    her body mimics the disease    Peripheral neuropathy    S/P colonoscopy    Dr. Rowe Pavy, internal hemorrhoids, repeat in Sept 2022   S/P endoscopy    Dr. Rowe Pavy 2008: removal of impacted food bolus, Dr. Rowe Pavy 2010: moderate gastritis, Sept 2012 with SLF: path with mild gastritis, no definite stricture noted, dilation with Savary 16 mm   Sciatica    Seizures ([place])    Sleep apnea    TIA (transient ischemic attack) 2012    Past Surgical History:  Procedure Laterality Date   ABDOMINAL HYSTERECTOMY     complete per patient   BALLOON DILATION N/A 10/05/2020   Procedure: BALLOON DILATION;  Surgeon: [name], DO;  Location: [facility];  Service: Endoscopy;  Laterality: N/A;   BLADDER SURGERY     stretch bladder opening   CHOLECYSTECTOMY     COLONOSCOPY  07/18/2011   SLF: 1. internal hemorrhoids   COLONOSCOPY WITH PROPOFOL N/A 03/21/2017   Dr. Oneida Alar: Redundant left colon, hemorrhoids, next colonoscopy in 10 years.  Patient reports deep pression after propofol.   COLONOSCOPY WITH PROPOFOL N/A 01/26/2021   Procedure: COLONOSCOPY WITH PROPOFOL;  Surgeon: [name], DO;  Location: [facility];  Service: Endoscopy;  Laterality: N/A;  am appt   ESOPHAGEAL MANOMETRY N/A 01/21/2013   esophageal surgery?     2008 [place]   ESOPHAGOGASTRODUODENOSCOPY  07/18/2011   SLF: 1. Moderate gastritis 2. Dysphagia most likely 2o large food bolus moving through her Nissen, which appears to  be intact. Pt has poor denttition and NL BPE 2 years ago. 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   ESOPHAGOGASTRODUODENOSCOPY (EGD) WITH PROPOFOL N/A 08/21/2018   dr. Oneida Alar: mild gastritis, esophagus appeared normal. s/p dilation   ESOPHAGOGASTRODUODENOSCOPY (EGD) WITH PROPOFOL N/A 10/05/2020   Nissen, benign-appearing stenosis s/p dilation, gastric polyps biospied and normal duodenum. Hyperplastic polyp and reactive gastropathy noted.    EVALUATION UNDER ANESTHESIA WITH HEMORRHOIDECTOMY N/A 03/27/2015    Procedure: EXAM UNDER ANESTHESIA WITH SPHINCTEROTOMY ;  Surgeon: Johnathan Hausen, MD;  Location: [facility];  Service: General;  Laterality: N/A;   FLEXIBLE SIGMOIDOSCOPY N/A 12/09/2014   SLF: Rectal pain most likely due to internal and external hemorroids   HAND SURGERY     carpal tumnnel right and removal of cyst left   HEMORRHOID BANDING N/A 12/09/2014   Procedure: HEMORRHOID BANDING;  Surgeon: Danie Binder, MD;  Location: [facility];  Service: Endoscopy;  Laterality: N/A;   LAPAROSCOPIC NISSEN FUNDOPLICATION N/A 0000000   Procedure: Redo LAPAROSCOPIC NISSEN FUNDOPLICATION;  Surgeon: Pedro Earls, MD;  Location: [facility];  Service: General;  Laterality: N/A;  Forgut explortation with partial  takedown nissan funliplication from [id number] for wrap torsion and persistant dysphagia   NASAL SEPTOPLASTY W/ TURBINOPLASTY Bilateral 08/03/2016   Procedure: NASAL SEPTOPLASTY WITH TURBINATE REDUCTION;  Surgeon: Leta Baptist, MD;  Location: [facility];  Service: ENT;  Laterality: Bilateral;   NECK SURGERY     removal of "knot" from neck   NISSEN FUNDOPLICATION     initially in 1994. redo in 2014   POLYPECTOMY  10/05/2020   Procedure: POLYPECTOMY;  Surgeon: [name], DO;  Location: [facility];  Service: Endoscopy;;   SAVORY DILATION  07/18/2011   Surgeon:Sandi M Fields   SAVORY DILATION  08/21/2018   Procedure: SAVORY DILATION;  Surgeon: Danie Binder, MD;  Location: [facility];  Service: Endoscopy;;    Current Outpatient Medications  Medication Sig Dispense Refill   acetaminophen (TYLENOL) 650 MG CR tablet Take 1,300 mg by mouth every 8 (eight) hours as needed.     Ascorbic Acid (VITAMIN C GUMMIE PO) Take 2-3 tablets by mouth every evening.     CARAFATE 1 GM/10ML suspension Take 3 g by mouth 2 (two) times daily.     Cholecalciferol (VITAMIN D-3) 125 MCG (5000 UT) TABS Take 10,000 Units by mouth at bedtime.     Coenzyme Q10 (COQ10) 100 MG CAPS Take 100 mg by mouth daily.     diclofenac Sodium  (VOLTAREN) 1 % GEL Apply 1 application topically 4 (four) times daily as needed (arthritis pain.).     EPINEPHrine 0.3 mg/0.3 mL IJ SOAJ injection Inject 0.3 mg into the muscle as needed for anaphylaxis.     esomeprazole (NEXIUM) 40 MG capsule Take 1 capsule (40 mg total) by mouth 2 (two) times daily before a meal. 180 capsule 0   estrogens, conjugated, (PREMARIN) 0.625 MG tablet Take 0.625 mg by mouth at bedtime.      Glucosamine-Chondroitin (MOVE FREE PO) Take 1 tablet by mouth every evening.      hydrOXYzine (ATARAX/VISTARIL) 25 MG tablet Take 25 mg by mouth at bedtime.      loratadine (CLARITIN) 10 MG tablet Take 10 mg by mouth every evening.     lubiprostone ([name]) 24 MCG capsule Take 1 capsule (24 mcg total) by mouth 2 (two) times daily with a meal. 60 capsule 11   magnesium hydroxide (DULCOLAX) 400 MG/5ML  suspension Take 15 mLs by mouth daily as needed for mild constipation. Takes 15 ml daily, 30 ml if not taking amitiza     montelukast (SINGULAIR) 10 MG tablet Take 10 mg by mouth at bedtime.     Multiple Vitamin (MULTIVITAMIN WITH MINERALS) TABS tablet Take 1 tablet by mouth every evening. Women's Multivitamin     NASACORT ALLERGY 24HR 55 MCG/ACT AERO nasal inhaler Place 1-2 sprays into the nose daily as needed (allergies).     ondansetron (ZOFRAN) 4 MG tablet Take 1 tablet (4 mg total) by mouth every 8 (eight) hours as needed for nausea or vomiting. 60 tablet 3   rizatriptan (MAXALT) 10 MG tablet Take 10 mg by mouth 2 (two) times daily as needed for migraine.      simethicone (MYLICON) 125 MG chewable tablet Chew 250 mg by mouth every 6 (six) hours as needed for flatulence.     tiZANidine (ZANAFLEX) 4 MG tablet Take 4 mg by mouth at bedtime.     traZODone (DESYREL) 50 MG tablet Take 25-50 mg by mouth at bedtime as needed.     Turmeric (QC TUMERIC COMPLEX PO) Take by mouth. Takes three(3) chews QHS;     umeclidinium-vilanterol (ANORO ELLIPTA) 62.5-25 MCG/INH AEPB Inhale 1 puff into the  lungs daily as needed (shortness of breath).     vitamin E 1000 UNIT capsule Take 1,000 Units by mouth at bedtime.      No current facility-administered medications for this visit.    Allergies as of 11/16/2022 - Review Complete 11/16/2022  Allergen Reaction Noted   Aspirin Rash 07/28/2016   Avelox [moxifloxacin hcl in nacl] Anaphylaxis and Itching 06/20/2011   Linzess [linaclotide] Swelling 02/04/2013   Penicillins Anaphylaxis and Hives    Adhesive [tape] Other (See Comments) 01/24/2013   Carbamazepine Other (See Comments) 03/16/2015   Cephalexin Hives and Itching    Clindamycin/lincomycin Hives and Itching 12/17/2018   Codeine Hives and Itching    Cymbalta [duloxetine hcl] Other (See Comments) 06/20/2011   Darvon [propoxyphene] Hives 03/17/2015   Dicyclomine Itching and Other (See Comments) 06/20/2011   Dicyclomine hcl Other (See Comments) 11/12/2018   Hydrocodone-acetaminophen Itching 03/15/2017   Ketek [telithromycin] Hives and Itching 09/06/2017   Latex Itching 01/24/2013   Macrolides and ketolides Swelling 07/18/2011   Meperidine hcl Itching and Other (See Comments)    Metronidazole Hives and Other (See Comments) 06/20/2011   Morphine Itching and Other (See Comments)    Mupirocin Itching 12/17/2018   Pepcid [famotidine] Nausea And Vomiting 03/16/2015   Prednisone Hives, Swelling, and Other (See Comments) 06/20/2011   Propofol Other (See Comments) 03/17/2015   Protonix [pantoprazole] Swelling 03/17/2015   Solodyn [minocycline hcl er]  11/21/2019   Sulfamethoxazole-trimethoprim Other (See Comments) 11/12/2018   Sulfonamide derivatives Hives    Tramadol hcl Hives and Other (See Comments)    Tramadol hcl Other (See Comments) 11/12/2018   Zylet [loteprednol-tobramycin] Hives and Swelling 03/15/2017    Family History  Problem Relation Age of Onset   Anesthesia problems Sister    Cancer Sister        breast   Hypertension Sister    Parkinson's disease Mother    Kidney  disease Mother    Diabetes Mother    Heart attack Father    Cancer Father        stomach cancer   Hypertension Brother    Diabetes Brother    Arthritis Brother    Kidney disease Brother    Parkinson's disease Brother  Cancer Maternal Aunt        breast   Cancer Maternal Grandmother        breast   Multiple sclerosis Daughter    Colon cancer Cousin        4 cousins   Pseudochol deficiency Neg Hx    Hypotension Neg Hx    Malignant hyperthermia Neg Hx     Social History   Socioeconomic History   Marital status: Married    Spouse name: Not on file   Number of children: Not on file   Years of education: Not on file   Highest education level: Not on file  Occupational History   Not on file  Tobacco Use   Smoking status: Never   Smokeless tobacco: Never  Vaping Use   Vaping Use: Never used  Substance and Sexual Activity   Alcohol use: Not Currently    Comment: drinks wine "every blue moon"   Drug use: No   Sexual activity: Not Currently  Other Topics Concern   Not on file  Social History Narrative   Not on file   Social Determinants of Health   Financial Resource Strain: Not on file  Food Insecurity: Not on file  Transportation Needs: Not on file  Physical Activity: Not on file  Stress: Not on file  Social Connections: Not on file    Subjective: Review of Systems  Constitutional:  Negative for chills and fever.  HENT:  Negative for congestion and hearing loss.   Eyes:  Negative for blurred vision and double vision.  Respiratory:  Negative for cough and shortness of breath.   Cardiovascular:  Negative for chest pain and palpitations.  Gastrointestinal:  Positive for constipation and heartburn. Negative for abdominal pain, blood in stool, diarrhea, melena and vomiting.  Genitourinary:  Negative for dysuria and urgency.  Musculoskeletal:  Negative for joint pain and myalgias.  Skin:  Negative for itching and rash.  Neurological:  Negative for dizziness and  headaches.  Psychiatric/Behavioral:  Negative for depression. The patient is not nervous/anxious.      Objective: BP (!) 141/82 (BP Location: Left Arm, Patient Position: Sitting, Cuff Size: Large)   Pulse 76   Temp 97.7 F (36.5 C) (Temporal)   Ht 5\' 3"  (1.6 m)   Wt 170 lb 11.2 oz (77.4 kg)   BMI 30.24 kg/m  Physical Exam Constitutional:      Appearance: Normal appearance.  HENT:     Head: Normocephalic and atraumatic.  Eyes:     Extraocular Movements: Extraocular movements intact.     Conjunctiva/sclera: Conjunctivae normal.  Cardiovascular:     Rate and Rhythm: Normal rate and regular rhythm.  Pulmonary:     Effort: Pulmonary effort is normal.     Breath sounds: Normal breath sounds.  Abdominal:     General: Bowel sounds are normal.     Palpations: Abdomen is soft.  Musculoskeletal:        General: No swelling. Normal range of motion.     Cervical back: Normal range of motion and neck supple.  Skin:    General: Skin is warm and dry.     Coloration: Skin is not jaundiced.  Neurological:     General: No focal deficit present.     Mental Status: She is alert and oriented to person, place, and time.  Psychiatric:        Mood and Affect: Mood normal.        Behavior: Behavior normal.  Assessment: *Irritable bowel syndrome with constipation *Chronic GERD *Dysphagia *Umbilical hernia  Plan: Patient's GERD relatively well controlled on Nexium twice daily.  We will continue.  Dysphagia worsening.Will schedule for EGD with dilation to evaluate for peptic ulcer disease, esophagitis, gastritis, H. Pylori, duodenitis, or other. Will also evaluate for esophageal stricture, Schatzki's ring, esophageal web or other.   The risks including infection, bleed, or perforation as well as benefits, limitations, alternatives and imponderables have been reviewed with the patient. Potential for esophageal dilation, biopsy, etc. have also been reviewed.  Questions have been  answered. All parties agreeable.  Regards to her constipation, she has trialed and failed Linzess in the past.  Currently taking [name] 24 mg twice daily.  Agree with liquid Dulcolax.  We can consider trialing her on Trulance in the future though she would like to hold off for now.  Patient with what appears to be a small umbilical hernia on exam today.  Offered referral to surgery to have evaluated but she would like to have EGD done first.  Follow-up in 6 months.  11/16/2022 3:48 PM   Disclaimer: This note was dictated with voice recognition software. Similar sounding words can inadvertently be transcribed and may not be corrected upon review.

## 2022-11-16 NOTE — Telephone Encounter (Signed)
Approved via cohere. Auth# [id number] , DOS: 11/29/2022 - 02/28/2023

## 2022-11-16 NOTE — H&P (View-Only) (Signed)
Referring Provider: Neale Burly, MD Primary Care Physician:  Neale Burly, MD Primary GI:  Dr. Abbey Chatters  Chief Complaint  Patient presents with   Follow-up    Patient here today due to issues with dysphagia. Patient states food gets stuck in the epigastric area.She is taking nexium 40 mg bid, she is also taking carafate 1 gm bid.  Patient has also thinks she may have a hiatal hernia. Patient has seen bright red blood in stool recently but none in the last few days.     HPI:   Yesenia Boyd is a 67 y.o. female who presents to clinic today for follow-up visit.  She has complicated past GI history including Niesen fundoplication x2, history of chronic dysphagia, constipation.    EGD 10/05/2020 for dysphagia showed esophageal stricture status post dilation with 18 mm balloon with moderate improvement as well as mucosal disruption.  Biopsies of the stomach negative for H. pylori.  Hyperplastic polyp removed from stomach.  Colonoscopy 01/26/2021 for rectal discomfort, unremarkable with recommended 10-year recall.  Today she states she is doing okay.  States her dysphagia is "getting bad again" believes that she needs repeat EGD with dilation.  Chronically takes Nexium twice daily, GERD well-controlled.  Constipation continues to be an issue.  Also with associated abdominal pain.  Currently taking [name] 24 mg twice daily.  Previously tried and failed Linzess.  Recently added on a liquid Dulcolax.  Also complaining of outpouching of her bellybutton, she is concerned about a hernia.  Occasional discomfort.  Past Medical History:  Diagnosis Date   Arthritis    Asthma    Complication of anesthesia    Deep depression following administration of propofol   Depression    Fibromyalgia    GERD (gastroesophageal reflux disease)    History of hiatal hernia    2 surgeries, Lap. Nissen Fundiplication    IBS (irritable bowel syndrome)    Myasthenia gravis    her body mimics the disease    Peripheral neuropathy    S/P colonoscopy    Dr. Rowe Pavy, internal hemorrhoids, repeat in Sept 2022   S/P endoscopy    Dr. Rowe Pavy 2008: removal of impacted food bolus, Dr. Rowe Pavy 2010: moderate gastritis, Sept 2012 with SLF: path with mild gastritis, no definite stricture noted, dilation with Savary 16 mm   Sciatica    Seizures ([place])    Sleep apnea    TIA (transient ischemic attack) 2012    Past Surgical History:  Procedure Laterality Date   ABDOMINAL HYSTERECTOMY     complete per patient   BALLOON DILATION N/A 10/05/2020   Procedure: BALLOON DILATION;  Surgeon: [name], DO;  Location: [facility];  Service: Endoscopy;  Laterality: N/A;   BLADDER SURGERY     stretch bladder opening   CHOLECYSTECTOMY     COLONOSCOPY  07/18/2011   SLF: 1. internal hemorrhoids   COLONOSCOPY WITH PROPOFOL N/A 03/21/2017   Dr. Oneida Alar: Redundant left colon, hemorrhoids, next colonoscopy in 10 years.  Patient reports deep pression after propofol.   COLONOSCOPY WITH PROPOFOL N/A 01/26/2021   Procedure: COLONOSCOPY WITH PROPOFOL;  Surgeon: [name], DO;  [facility]: [facility];  Service: Endoscopy;  Laterality: N/A;  am appt   ESOPHAGEAL MANOMETRY N/A 01/21/2013   esophageal surgery?     2008 [place]   ESOPHAGOGASTRODUODENOSCOPY  07/18/2011   SLF: 1. Moderate gastritis 2. Dysphagia most likely 2o large food bolus moving through her Nissen, which appears to  be intact. Pt has poor denttition and NL BPE 2 years ago. 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   ESOPHAGOGASTRODUODENOSCOPY (EGD) WITH PROPOFOL N/A 08/21/2018   dr. Fields: mild gastritis, esophagus appeared normal. s/p dilation   ESOPHAGOGASTRODUODENOSCOPY (EGD) WITH PROPOFOL N/A 10/05/2020   Nissen, benign-appearing stenosis s/p dilation, gastric polyps biospied and normal duodenum. Hyperplastic polyp and reactive gastropathy noted.    EVALUATION UNDER ANESTHESIA WITH HEMORRHOIDECTOMY N/A 03/27/2015    Procedure: EXAM UNDER ANESTHESIA WITH SPHINCTEROTOMY ;  Surgeon: Matthew Martin, MD;  Location: [facility];  Service: General;  Laterality: N/A;   FLEXIBLE SIGMOIDOSCOPY N/A 12/09/2014   SLF: Rectal pain most likely due to internal and external hemorroids   HAND SURGERY     carpal tumnnel right and removal of cyst left   HEMORRHOID BANDING N/A 12/09/2014   Procedure: HEMORRHOID BANDING;  Surgeon: Sandi L Fields, MD;  Location: [facility];  Service: Endoscopy;  Laterality: N/A;   LAPAROSCOPIC NISSEN FUNDOPLICATION N/A 02/13/2013   Procedure: Redo LAPAROSCOPIC NISSEN FUNDOPLICATION;  Surgeon: Matthew B Martin, MD;  Location: [facility];  Service: General;  Laterality: N/A;  Forgut explortation with partial  takedown nissan funliplication from 1994 for wrap torsion and persistant dysphagia   NASAL SEPTOPLASTY W/ TURBINOPLASTY Bilateral 08/03/2016   Procedure: NASAL SEPTOPLASTY WITH TURBINATE REDUCTION;  Surgeon: Su Teoh, MD;  Location: [facility];  Service: ENT;  Laterality: Bilateral;   NECK SURGERY     removal of "knot" from neck   NISSEN FUNDOPLICATION     initially in 1994. redo in 2014   POLYPECTOMY  10/05/2020   Procedure: POLYPECTOMY;  Surgeon: Lavel Rieman K, DO;  [facility]: [facility];  Service: Endoscopy;;   SAVORY DILATION  07/18/2011   Surgeon:Sandi M Fields   SAVORY DILATION  08/21/2018   Procedure: SAVORY DILATION;  Surgeon: Fields, Sandi L, MD;  [facility]: [facility];  Service: Endoscopy;;    Current Outpatient Medications  Medication Sig Dispense Refill   acetaminophen (TYLENOL) 650 MG CR tablet Take 1,300 mg by mouth every 8 (eight) hours as needed.     Ascorbic Acid (VITAMIN C GUMMIE PO) Take 2-3 tablets by mouth every evening.     CARAFATE 1 GM/10ML suspension Take 3 g by mouth 2 (two) times daily.     Cholecalciferol (VITAMIN D-3) 125 MCG (5000 UT) TABS Take 10,000 Units by mouth at bedtime.     Coenzyme Q10 (COQ10) 100 MG CAPS Take 100 mg by mouth daily.     diclofenac Sodium  (VOLTAREN) 1 % GEL Apply 1 application topically 4 (four) times daily as needed (arthritis pain.).     EPINEPHrine 0.3 mg/0.3 mL IJ SOAJ injection Inject 0.3 mg into the muscle as needed for anaphylaxis.     esomeprazole (NEXIUM) 40 MG capsule Take 1 capsule (40 mg total) by mouth 2 (two) times daily before a meal. 180 capsule 0   estrogens, conjugated, (PREMARIN) 0.625 MG tablet Take 0.625 mg by mouth at bedtime.      Glucosamine-Chondroitin (MOVE FREE PO) Take 1 tablet by mouth every evening.      hydrOXYzine (ATARAX/VISTARIL) 25 MG tablet Take 25 mg by mouth at bedtime.      loratadine (CLARITIN) 10 MG tablet Take 10 mg by mouth every evening.     lubiprostone ([name]) 24 MCG capsule Take 1 capsule (24 mcg total) by mouth 2 (two) times daily with a meal. 60 capsule 11   magnesium hydroxide (DULCOLAX) 400 MG/5ML   suspension Take 15 mLs by mouth daily as needed for mild constipation. Takes 15 ml daily, 30 ml if not taking amitiza     montelukast (SINGULAIR) 10 MG tablet Take 10 mg by mouth at bedtime.     Multiple Vitamin (MULTIVITAMIN WITH MINERALS) TABS tablet Take 1 tablet by mouth every evening. Women's Multivitamin     NASACORT ALLERGY 24HR 55 MCG/ACT AERO nasal inhaler Place 1-2 sprays into the nose daily as needed (allergies).     ondansetron (ZOFRAN) 4 MG tablet Take 1 tablet (4 mg total) by mouth every 8 (eight) hours as needed for nausea or vomiting. 60 tablet 3   rizatriptan (MAXALT) 10 MG tablet Take 10 mg by mouth 2 (two) times daily as needed for migraine.      simethicone (MYLICON) 125 MG chewable tablet Chew 250 mg by mouth every 6 (six) hours as needed for flatulence.     tiZANidine (ZANAFLEX) 4 MG tablet Take 4 mg by mouth at bedtime.     traZODone (DESYREL) 50 MG tablet Take 25-50 mg by mouth at bedtime as needed.     Turmeric (QC TUMERIC COMPLEX PO) Take by mouth. Takes three(3) chews QHS;     umeclidinium-vilanterol (ANORO ELLIPTA) 62.5-25 MCG/INH AEPB Inhale 1 puff into the  lungs daily as needed (shortness of breath).     vitamin E 1000 UNIT capsule Take 1,000 Units by mouth at bedtime.      No current facility-administered medications for this visit.    Allergies as of 11/16/2022 - Review Complete 11/16/2022  Allergen Reaction Noted   Aspirin Rash 07/28/2016   Avelox [moxifloxacin hcl in nacl] Anaphylaxis and Itching 06/20/2011   Linzess [linaclotide] Swelling 02/04/2013   Penicillins Anaphylaxis and Hives    Adhesive [tape] Other (See Comments) 01/24/2013   Carbamazepine Other (See Comments) 03/16/2015   Cephalexin Hives and Itching    Clindamycin/lincomycin Hives and Itching 12/17/2018   Codeine Hives and Itching    Cymbalta [duloxetine hcl] Other (See Comments) 06/20/2011   Darvon [propoxyphene] Hives 03/17/2015   Dicyclomine Itching and Other (See Comments) 06/20/2011   Dicyclomine hcl Other (See Comments) 11/12/2018   Hydrocodone-acetaminophen Itching 03/15/2017   Ketek [telithromycin] Hives and Itching 09/06/2017   Latex Itching 01/24/2013   Macrolides and ketolides Swelling 07/18/2011   Meperidine hcl Itching and Other (See Comments)    Metronidazole Hives and Other (See Comments) 06/20/2011   Morphine Itching and Other (See Comments)    Mupirocin Itching 12/17/2018   Pepcid [famotidine] Nausea And Vomiting 03/16/2015   Prednisone Hives, Swelling, and Other (See Comments) 06/20/2011   Propofol Other (See Comments) 03/17/2015   Protonix [pantoprazole] Swelling 03/17/2015   Solodyn [minocycline hcl er]  11/21/2019   Sulfamethoxazole-trimethoprim Other (See Comments) 11/12/2018   Sulfonamide derivatives Hives    Tramadol hcl Hives and Other (See Comments)    Tramadol hcl Other (See Comments) 11/12/2018   Zylet [loteprednol-tobramycin] Hives and Swelling 03/15/2017    Family History  Problem Relation Age of Onset   Anesthesia problems Sister    Cancer Sister        breast   Hypertension Sister    Parkinson's disease Mother    Kidney  disease Mother    Diabetes Mother    Heart attack Father    Cancer Father        stomach cancer   Hypertension Brother    Diabetes Brother    Arthritis Brother    Kidney disease Brother    Parkinson's disease Brother  Cancer Maternal Aunt        breast   Cancer Maternal Grandmother        breast   Multiple sclerosis Daughter    Colon cancer Cousin        4 cousins   Pseudochol deficiency Neg Hx    Hypotension Neg Hx    Malignant hyperthermia Neg Hx     Social History   Socioeconomic History   Marital status: Married    Spouse name: Not on file   Number of children: Not on file   Years of education: Not on file   Highest education level: Not on file  Occupational History   Not on file  Tobacco Use   Smoking status: Never   Smokeless tobacco: Never  Vaping Use   Vaping Use: Never used  Substance and Sexual Activity   Alcohol use: Not Currently    Comment: drinks wine "every blue moon"   Drug use: No   Sexual activity: Not Currently  Other Topics Concern   Not on file  Social History Narrative   Not on file   Social Determinants of Health   Financial Resource Strain: Not on file  Food Insecurity: Not on file  Transportation Needs: Not on file  Physical Activity: Not on file  Stress: Not on file  Social Connections: Not on file    Subjective: Review of Systems  Constitutional:  Negative for chills and fever.  HENT:  Negative for congestion and hearing loss.   Eyes:  Negative for blurred vision and double vision.  Respiratory:  Negative for cough and shortness of breath.   Cardiovascular:  Negative for chest pain and palpitations.  Gastrointestinal:  Positive for constipation and heartburn. Negative for abdominal pain, blood in stool, diarrhea, melena and vomiting.  Genitourinary:  Negative for dysuria and urgency.  Musculoskeletal:  Negative for joint pain and myalgias.  Skin:  Negative for itching and rash.  Neurological:  Negative for dizziness and  headaches.  Psychiatric/Behavioral:  Negative for depression. The patient is not nervous/anxious.      Objective: BP (!) 141/82 (BP Location: Left Arm, Patient Position: Sitting, Cuff Size: Large)   Pulse 76   Temp 97.7 F (36.5 C) (Temporal)   Ht 5\' 3"  (1.6 m)   Wt 170 lb 11.2 oz (77.4 kg)   BMI 30.24 kg/m  Physical Exam Constitutional:      Appearance: Normal appearance.  HENT:     Head: Normocephalic and atraumatic.  Eyes:     Extraocular Movements: Extraocular movements intact.     Conjunctiva/sclera: Conjunctivae normal.  Cardiovascular:     Rate and Rhythm: Normal rate and regular rhythm.  Pulmonary:     Effort: Pulmonary effort is normal.     Breath sounds: Normal breath sounds.  Abdominal:     General: Bowel sounds are normal.     Palpations: Abdomen is soft.  Musculoskeletal:        General: No swelling. Normal range of motion.     Cervical back: Normal range of motion and neck supple.  Skin:    General: Skin is warm and dry.     Coloration: Skin is not jaundiced.  Neurological:     General: No focal deficit present.     Mental Status: She is alert and oriented to person, place, and time.  Psychiatric:        Mood and Affect: Mood normal.        Behavior: Behavior normal.  Assessment: *Irritable bowel syndrome with constipation *Chronic GERD *Dysphagia *Umbilical hernia  Plan: Patient's GERD relatively well controlled on Nexium twice daily.  We will continue.  Dysphagia worsening.Will schedule for EGD with dilation to evaluate for peptic ulcer disease, esophagitis, gastritis, H. Pylori, duodenitis, or other. Will also evaluate for esophageal stricture, Schatzki's ring, esophageal web or other.   The risks including infection, bleed, or perforation as well as benefits, limitations, alternatives and imponderables have been reviewed with the patient. Potential for esophageal dilation, biopsy, etc. have also been reviewed.  Questions have been  answered. All parties agreeable.  Regards to her constipation, she has trialed and failed Linzess in the past.  Currently taking [name] 24 mg twice daily.  Agree with liquid Dulcolax.  We can consider trialing her on Trulance in the future though she would like to hold off for now.  Patient with what appears to be a small umbilical hernia on exam today.  Offered referral to surgery to have evaluated but she would like to have EGD done first.  Follow-up in 6 months.  11/16/2022 3:48 PM   Disclaimer: This note was dictated with voice recognition software. Similar sounding words can inadvertently be transcribed and may not be corrected upon review.  

## 2022-11-17 DIAGNOSIS — Z8673 Personal history of transient ischemic attack (TIA), and cerebral infarction without residual deficits: Secondary | ICD-10-CM | POA: Diagnosis not present

## 2022-11-17 DIAGNOSIS — M160 Bilateral primary osteoarthritis of hip: Secondary | ICD-10-CM | POA: Diagnosis not present

## 2022-11-17 DIAGNOSIS — M79604 Pain in right leg: Secondary | ICD-10-CM | POA: Diagnosis not present

## 2022-11-17 DIAGNOSIS — M1990 Unspecified osteoarthritis, unspecified site: Secondary | ICD-10-CM | POA: Diagnosis not present

## 2022-11-17 DIAGNOSIS — Z885 Allergy status to narcotic agent status: Secondary | ICD-10-CM | POA: Diagnosis not present

## 2022-11-17 DIAGNOSIS — M79661 Pain in right lower leg: Secondary | ICD-10-CM | POA: Diagnosis not present

## 2022-11-17 DIAGNOSIS — M19071 Primary osteoarthritis, right ankle and foot: Secondary | ICD-10-CM | POA: Diagnosis not present

## 2022-11-17 DIAGNOSIS — Z91040 Latex allergy status: Secondary | ICD-10-CM | POA: Diagnosis not present

## 2022-11-17 DIAGNOSIS — M25561 Pain in right knee: Secondary | ICD-10-CM | POA: Diagnosis not present

## 2022-11-17 DIAGNOSIS — Z882 Allergy status to sulfonamides status: Secondary | ICD-10-CM | POA: Diagnosis not present

## 2022-11-17 DIAGNOSIS — Z880 Allergy status to penicillin: Secondary | ICD-10-CM | POA: Diagnosis not present

## 2022-11-17 DIAGNOSIS — J45909 Unspecified asthma, uncomplicated: Secondary | ICD-10-CM | POA: Diagnosis not present

## 2022-11-17 DIAGNOSIS — N3289 Other specified disorders of bladder: Secondary | ICD-10-CM | POA: Diagnosis not present

## 2022-11-22 DIAGNOSIS — Z6828 Body mass index (BMI) 28.0-28.9, adult: Secondary | ICD-10-CM | POA: Diagnosis not present

## 2022-11-22 DIAGNOSIS — J301 Allergic rhinitis due to pollen: Secondary | ICD-10-CM | POA: Diagnosis not present

## 2022-11-22 DIAGNOSIS — M79604 Pain in right leg: Secondary | ICD-10-CM | POA: Diagnosis not present

## 2022-11-22 DIAGNOSIS — K219 Gastro-esophageal reflux disease without esophagitis: Secondary | ICD-10-CM | POA: Diagnosis not present

## 2022-11-22 DIAGNOSIS — Z0000 Encounter for general adult medical examination without abnormal findings: Secondary | ICD-10-CM | POA: Diagnosis not present

## 2022-11-22 DIAGNOSIS — F451 Undifferentiated somatoform disorder: Secondary | ICD-10-CM | POA: Diagnosis not present

## 2022-11-23 DIAGNOSIS — M7631 Iliotibial band syndrome, right leg: Secondary | ICD-10-CM | POA: Diagnosis not present

## 2022-11-23 DIAGNOSIS — M79604 Pain in right leg: Secondary | ICD-10-CM | POA: Diagnosis not present

## 2022-11-23 DIAGNOSIS — M25561 Pain in right knee: Secondary | ICD-10-CM | POA: Diagnosis not present

## 2022-11-23 DIAGNOSIS — M25559 Pain in unspecified hip: Secondary | ICD-10-CM | POA: Diagnosis not present

## 2022-11-25 DIAGNOSIS — M79604 Pain in right leg: Secondary | ICD-10-CM | POA: Diagnosis not present

## 2022-11-25 DIAGNOSIS — E7849 Other hyperlipidemia: Secondary | ICD-10-CM | POA: Diagnosis not present

## 2022-11-25 DIAGNOSIS — M1711 Unilateral primary osteoarthritis, right knee: Secondary | ICD-10-CM | POA: Diagnosis not present

## 2022-11-25 DIAGNOSIS — M7121 Synovial cyst of popliteal space [Baker], right knee: Secondary | ICD-10-CM | POA: Diagnosis not present

## 2022-11-25 DIAGNOSIS — J301 Allergic rhinitis due to pollen: Secondary | ICD-10-CM | POA: Diagnosis not present

## 2022-11-25 DIAGNOSIS — K219 Gastro-esophageal reflux disease without esophagitis: Secondary | ICD-10-CM | POA: Diagnosis not present

## 2022-11-25 DIAGNOSIS — Z0000 Encounter for general adult medical examination without abnormal findings: Secondary | ICD-10-CM | POA: Diagnosis not present

## 2022-11-25 DIAGNOSIS — F451 Undifferentiated somatoform disorder: Secondary | ICD-10-CM | POA: Diagnosis not present

## 2022-11-25 DIAGNOSIS — R600 Localized edema: Secondary | ICD-10-CM | POA: Diagnosis not present

## 2022-11-28 ENCOUNTER — Other Ambulatory Visit: Payer: Self-pay

## 2022-11-28 DIAGNOSIS — K581 Irritable bowel syndrome with constipation: Secondary | ICD-10-CM

## 2022-11-28 MED ORDER — LUBIPROSTONE 24 MCG PO CAPS: 24.0000 ug | ORAL_CAPSULE | Freq: Two times a day (BID) | ORAL | 11 refills | Status: DC

## 2022-11-29 ENCOUNTER — Encounter (HOSPITAL_COMMUNITY)
Admission: RE | Disposition: A | Discharge status: Home / Self Care | Payer: Self-pay | Source: Home / Self Care | Attending: Internal Medicine

## 2022-11-29 ENCOUNTER — Ambulatory Visit (HOSPITAL_BASED_OUTPATIENT_CLINIC_OR_DEPARTMENT_OTHER): Payer: Medicare HMO | Admitting: Anesthesiology

## 2022-11-29 ENCOUNTER — Other Ambulatory Visit: Payer: Self-pay

## 2022-11-29 ENCOUNTER — Ambulatory Visit (HOSPITAL_COMMUNITY)
Admission: RE | Admit: 2022-11-29 | Discharge: 2022-11-29 | Disposition: A | Discharge status: Home / Self Care | Payer: Medicare HMO | Attending: Internal Medicine | Admitting: Internal Medicine

## 2022-11-29 ENCOUNTER — Ambulatory Visit (HOSPITAL_COMMUNITY): Payer: Medicare HMO | Admitting: Anesthesiology

## 2022-11-29 ENCOUNTER — Encounter (HOSPITAL_COMMUNITY): Payer: Self-pay

## 2022-11-29 DIAGNOSIS — K5900 Constipation, unspecified: Secondary | ICD-10-CM | POA: Diagnosis not present

## 2022-11-29 DIAGNOSIS — R1319 Other dysphagia: Secondary | ICD-10-CM

## 2022-11-29 DIAGNOSIS — R1310 Dysphagia, unspecified: Secondary | ICD-10-CM

## 2022-11-29 DIAGNOSIS — M1990 Unspecified osteoarthritis, unspecified site: Secondary | ICD-10-CM | POA: Diagnosis not present

## 2022-11-29 DIAGNOSIS — M797 Fibromyalgia: Secondary | ICD-10-CM | POA: Insufficient documentation

## 2022-11-29 DIAGNOSIS — Z79899 Other long term (current) drug therapy: Secondary | ICD-10-CM | POA: Insufficient documentation

## 2022-11-29 DIAGNOSIS — Z9889 Other specified postprocedural states: Secondary | ICD-10-CM

## 2022-11-29 DIAGNOSIS — G4730 Sleep apnea, unspecified: Secondary | ICD-10-CM | POA: Diagnosis not present

## 2022-11-29 DIAGNOSIS — J45909 Unspecified asthma, uncomplicated: Secondary | ICD-10-CM | POA: Insufficient documentation

## 2022-11-29 DIAGNOSIS — Z8673 Personal history of transient ischemic attack (TIA), and cerebral infarction without residual deficits: Secondary | ICD-10-CM | POA: Diagnosis not present

## 2022-11-29 DIAGNOSIS — R12 Heartburn: Secondary | ICD-10-CM

## 2022-11-29 DIAGNOSIS — K219 Gastro-esophageal reflux disease without esophagitis: Secondary | ICD-10-CM | POA: Diagnosis not present

## 2022-11-29 DIAGNOSIS — R569 Unspecified convulsions: Secondary | ICD-10-CM | POA: Insufficient documentation

## 2022-11-29 DIAGNOSIS — F32A Depression, unspecified: Secondary | ICD-10-CM | POA: Insufficient documentation

## 2022-11-29 HISTORY — PX: ESOPHAGOGASTRODUODENOSCOPY (EGD) WITH PROPOFOL: SHX5813

## 2022-11-29 HISTORY — PX: BALLOON DILATION: SHX5330

## 2022-11-29 SURGERY — ESOPHAGOGASTRODUODENOSCOPY (EGD) WITH PROPOFOL
Anesthesia: General

## 2022-11-29 MED ORDER — LIDOCAINE 2% (20 MG/ML) 5 ML SYRINGE
INTRAMUSCULAR | Status: DC | PRN
  Administered 2022-11-29: 40 mg via INTRAVENOUS

## 2022-11-29 MED ORDER — LACTATED RINGERS IV SOLN: INTRAVENOUS | Status: DC

## 2022-11-29 MED ORDER — PROPOFOL 10 MG/ML IV BOLUS
INTRAVENOUS | Status: DC | PRN
  Administered 2022-11-29: 70 mg via INTRAVENOUS

## 2022-11-29 MED ORDER — PROPOFOL 500 MG/50ML IV EMUL
INTRAVENOUS | Status: DC | PRN
  Administered 2022-11-29: 200 ug/kg/min via INTRAVENOUS

## 2022-11-29 NOTE — Anesthesia Preprocedure Evaluation (Addendum)
Anesthesia Evaluation  Patient identified by MRN, date of birth, ID band Patient awake    Reviewed: Allergy & Precautions, H&P , NPO status , Patient's Chart, lab work & pertinent test results  History of Anesthesia Complications (+) history of anesthetic complications  Airway Mallampati: II  TM Distance: >3 FB Neck ROM: Full    Dental  (+) Dental Advisory Given, Missing,    Pulmonary asthma , sleep apnea    Pulmonary exam normal breath sounds clear to auscultation       Cardiovascular negative cardio ROS Normal cardiovascular exam Rhythm:Regular Rate:Normal     Neuro/Psych Seizures - (not on meds),  PSYCHIATRIC DISORDERS  Depression    TIA Neuromuscular disease    GI/Hepatic Neg liver ROS, hiatal hernia,GERD  Medicated,,  Endo/Other  negative endocrine ROS    Renal/GU negative Renal ROS  negative genitourinary   Musculoskeletal  (+) Arthritis , Osteoarthritis,  Fibromyalgia -  Abdominal   Peds negative pediatric ROS (+)  Hematology negative hematology ROS (+)   Anesthesia Other Findings   Reproductive/Obstetrics negative OB ROS                             Anesthesia Physical Anesthesia Plan  ASA: 2  Anesthesia Plan: General   Post-op Pain Management: Minimal or no pain anticipated   Induction: Intravenous  PONV Risk Score and Plan: Propofol infusion  Airway Management Planned: Nasal Cannula and Natural Airway  Additional Equipment:   Intra-op Plan:   Post-operative Plan:   Informed Consent: I have reviewed the patients History and Physical, chart, labs and discussed the procedure including the risks, benefits and alternatives for the proposed anesthesia with the patient or authorized representative who has indicated his/her understanding and acceptance.     Dental advisory given  Plan Discussed with: CRNA and Surgeon  Anesthesia Plan Comments: (Propofol makes her  laugh and depressed)       Anesthesia Quick Evaluation

## 2022-11-29 NOTE — Interval H&P Note (Signed)
History and Physical Interval Note:  11/29/2022 7:57 AM  Yesenia Boyd  has presented today for surgery, with the diagnosis of dysphagia, gerd.  The various methods of treatment have been discussed with the patient and family. After consideration of risks, benefits and other options for treatment, the patient has consented to  Procedure(s) with comments: ESOPHAGOGASTRODUODENOSCOPY (EGD) WITH PROPOFOL (N/A) - 930am, asa 2 BALLOON DILATION (N/A) as a surgical intervention.  The patient's history has been reviewed, patient examined, no change in status, stable for surgery.  I have reviewed the patient's chart and labs.  Questions were answered to the patient's satisfaction.     [name]

## 2022-11-29 NOTE — Transfer of Care (Signed)
Immediate Anesthesia Transfer of Care Note  Patient: Yesenia Boyd  Procedure(s) Performed: ESOPHAGOGASTRODUODENOSCOPY (EGD) WITH PROPOFOL BALLOON DILATION  Patient Location: PACU  Anesthesia Type:General  Level of Consciousness: awake  Airway & Oxygen Therapy: Patient Spontanous Breathing  Post-op Assessment: Report given to RN and Post -op Vital signs reviewed and stable  Post vital signs: Reviewed and stable  Last Vitals:  Vitals Value Taken Time  BP    Temp    Pulse    Resp    SpO2      Last Pain:  Vitals:   11/29/22 0830  TempSrc:   PainSc: 7       Patients Stated Pain Goal: 8 (45/62/56 3893)  Complications: No notable events documented.

## 2022-11-29 NOTE — Op Note (Signed)
[facility] Patient Name: Yesenia Boyd Procedure Date: 11/29/2022 8:21 AM MRN: [id number] Date of Birth: 08-14-1956 Attending MD: Elon Alas. Abbey Chatters , [place], [id number] CSN: [id number] Age: 67 Admit Type: Outpatient Procedure:                Upper GI endoscopy Indications:              Dysphagia, Heartburn Providers:                Elon Alas. Abbey Chatters, DO, Lambert Mody, Dereck Leep, Technician Referring MD:              Medicines:                See the Anesthesia note for documentation of the                            administered medications Complications:            No immediate complications. Estimated Blood Loss:     Estimated blood loss was minimal. Procedure:                Pre-Anesthesia Assessment:                           - The anesthesia plan was to use monitored                            anesthesia care (MAC).                           After obtaining informed consent, the endoscope was                            passed under direct vision. Throughout the                            procedure, the patient's blood pressure, pulse, and                            oxygen saturations were monitored continuously. The                            [id number]-H190 ([id number]) scope was introduced through the                            mouth, and advanced to the second part of duodenum.                            The upper GI endoscopy was accomplished without                            difficulty. The patient tolerated the procedure                            well. Scope In: 8:34:59  AM Scope Out: 8:41:15 AM Total Procedure Duration: 0 hours 6 minutes 16 seconds  Findings:      Evidence of a Nissen fundoplication was found at the gastroesophageal       junction. The wrap appeared tight. This was traversed. A TTS dilator was       passed through the scope. Dilation with an 18-19-20 mm balloon dilator       was performed to 20 mm. The dilation site  was examined and showed mild       mucosal disruption and moderate improvement in luminal narrowing.      The entire examined stomach was normal.      The duodenal bulb, first portion of the duodenum and second portion of       the duodenum were normal. Impression:               - A Nissen fundoplication was found. The wrap                            appears tight. Dilated.                           - Normal stomach.                           - Normal duodenal bulb, first portion of the                            duodenum and second portion of the duodenum.                           - No specimens collected. Moderate Sedation:      Per Anesthesia Care Recommendation:           - Patient has a contact number available for                            emergencies. The signs and symptoms of potential                            delayed complications were discussed with the                            patient. Return to normal activities tomorrow.                            Written discharge instructions were provided to the                            patient.                           - Resume previous diet.                           - Continue present medications.                           - Repeat upper endoscopy PRN  for retreatment.                           - Return to GI clinic in 3 months. Procedure Code(s):        --- Professional ---                           [phone], Esophagogastroduodenoscopy, flexible,                            transoral; with transendoscopic balloon dilation of                            esophagus (less than 30 mm diameter) Diagnosis Code(s):        --- Professional ---                           V89.381, Other specified postprocedural states                           R13.10, Dysphagia, unspecified                           R12, Heartburn CPT copyright 2022 American Medical Association. All rights reserved. The codes documented in this report are preliminary and  upon coder review may  be revised to meet current compliance requirements. Elon Alas. Abbey Chatters, DO [place] Abbey Chatters, DO 11/29/2022 8:46:34 AM This report has been signed electronically. Number of Addenda: 0

## 2022-11-29 NOTE — Anesthesia Postprocedure Evaluation (Signed)
Anesthesia Post Note  Patient: Yesenia Boyd  Procedure(s) Performed: ESOPHAGOGASTRODUODENOSCOPY (EGD) WITH PROPOFOL BALLOON DILATION  Patient location during evaluation: Phase II Anesthesia Type: General Level of consciousness: awake and alert and oriented Pain management: pain level controlled Vital Signs Assessment: post-procedure vital signs reviewed and stable Respiratory status: spontaneous breathing, nonlabored ventilation and respiratory function stable Cardiovascular status: blood pressure returned to baseline and stable Postop Assessment: no apparent nausea or vomiting Anesthetic complications: no  No notable events documented.   Last Vitals:  Vitals:   11/29/22 0708 11/29/22 0845  BP: 135/74 123/62  Pulse: 74 71  Resp: (!) 23 16  Temp: 36.4 C 36.7 C  SpO2: 100% 99%    Last Pain:  Vitals:   11/29/22 0845  TempSrc: Axillary  PainSc: 0-No pain                 Sholom Dulude C Kymir Coles

## 2022-11-29 NOTE — Discharge Instructions (Addendum)
EGD Discharge instructions Please read the instructions outlined below and refer to this sheet in the next few weeks. These discharge instructions provide you with general information on caring for yourself after you leave the hospital. Your doctor may also give you specific instructions. While your treatment has been planned according to the most current medical practices available, unavoidable complications occasionally occur. If you have any problems or questions after discharge, please call your doctor. ACTIVITY You may resume your regular activity but move at a slower pace for the next 24 hours.  Take frequent rest periods for the next 24 hours.  Walking will help expel (get rid of) the air and reduce the bloated feeling in your abdomen.  No driving for 24 hours (because of the anesthesia (medicine) used during the test).  You may shower.  Do not sign any important legal documents or operate any machinery for 24 hours (because of the anesthesia used during the test).  NUTRITION Drink plenty of fluids.  You may resume your normal diet.  Begin with a light meal and progress to your normal diet.  Avoid alcoholic beverages for 24 hours or as instructed by your caregiver.  MEDICATIONS You may resume your normal medications unless your caregiver tells you otherwise.  WHAT YOU CAN EXPECT TODAY You may experience abdominal discomfort such as a feeling of fullness or "gas" pains.  FOLLOW-UP Your doctor will discuss the results of your test with you.  SEEK IMMEDIATE MEDICAL ATTENTION IF ANY OF THE FOLLOWING OCCUR: Excessive nausea (feeling sick to your stomach) and/or vomiting.  Severe abdominal pain and distention (swelling).  Trouble swallowing.  Temperature over 101 F (37.8 C).  Rectal bleeding or vomiting of blood.   The distal part of your esophagus was tight today.  I did stretch this.  Hopefully this helps with your swallowing.  Stomach and small bowel were Normal.  Continue on  Nexium twice daily.  Follow-up GI in 3 months. Office will call you for this appointment   I hope you have a great rest of your week!  Elon Alas. Abbey Chatters, D.O. Gastroenterology and Hepatology [facility]

## 2022-12-01 ENCOUNTER — Ambulatory Visit: Payer: Medicare HMO | Admitting: General Surgery

## 2022-12-01 ENCOUNTER — Encounter: Payer: Self-pay | Admitting: General Surgery

## 2022-12-01 VITALS — BP 135/79 | HR 71 | Temp 98.3°F | Resp 12 | Ht 63.0 in | Wt 173.0 lb

## 2022-12-01 DIAGNOSIS — K429 Umbilical hernia without obstruction or gangrene: Secondary | ICD-10-CM

## 2022-12-02 ENCOUNTER — Encounter (HOSPITAL_COMMUNITY)
Admission: RE | Admit: 2022-12-02 | Discharge: 2022-12-02 | Disposition: A | Discharge status: Home / Self Care | Payer: Medicare HMO | Source: Ambulatory Visit | Attending: General Surgery | Admitting: General Surgery

## 2022-12-02 ENCOUNTER — Other Ambulatory Visit: Payer: Self-pay

## 2022-12-02 ENCOUNTER — Encounter (HOSPITAL_COMMUNITY): Payer: Self-pay

## 2022-12-02 VITALS — BP 139/65 | HR 87 | Temp 98.4°F | Ht 63.0 in | Wt 173.0 lb

## 2022-12-02 DIAGNOSIS — Z01818 Encounter for other preprocedural examination: Secondary | ICD-10-CM

## 2022-12-02 DIAGNOSIS — Z01812 Encounter for preprocedural laboratory examination: Secondary | ICD-10-CM | POA: Diagnosis not present

## 2022-12-02 DIAGNOSIS — M25559 Pain in unspecified hip: Secondary | ICD-10-CM | POA: Diagnosis not present

## 2022-12-02 DIAGNOSIS — M7631 Iliotibial band syndrome, right leg: Secondary | ICD-10-CM | POA: Diagnosis not present

## 2022-12-02 DIAGNOSIS — M25561 Pain in right knee: Secondary | ICD-10-CM | POA: Diagnosis not present

## 2022-12-02 LAB — BASIC METABOLIC PANEL
Anion gap: 8 (ref 5–15)
BUN: 13 mg/dL (ref 8–23)
CO2: 26 mmol/L (ref 22–32)
Calcium: 9.1 mg/dL (ref 8.9–10.3)
Chloride: 102 mmol/L (ref 98–111)
Creatinine, Ser: 0.76 mg/dL (ref 0.44–1.00)
GFR, Estimated: >60 mL/min (ref >60)
Glucose, Bld: 122 mg/dL — ABNORMAL HIGH (ref 70–99)
Potassium: 3.9 mmol/L (ref 3.5–5.1)
Sodium: 136 mmol/L (ref 135–145)

## 2022-12-02 NOTE — H&P (Signed)
Yesenia Boyd; [id number]; 1956-02-06   HPI Patient is a 67 year old black female who was referred to my care by Dr. Sherrie Sport for evaluation and treatment of periumbilical pain.  Patient states she has had intermittent swelling around the umbilicus for over a year, but it seems to be increasing in frequency and causing her discomfort.  She denies any nausea or vomiting.  It is made worse when she is standing.  She has never had surgery in the umbilical area. Past Medical History:  Diagnosis Date   Arthritis    Asthma    Complication of anesthesia    Deep depression following administration of propofol   Depression    Fibromyalgia    GERD (gastroesophageal reflux disease)    History of hiatal hernia    2 surgeries, Lap. Nissen Fundiplication    IBS (irritable bowel syndrome)    Myasthenia gravis    her body mimics the disease   Peripheral neuropathy    S/P colonoscopy    Dr. Rowe Pavy, internal hemorrhoids, repeat in Sept 2022   S/P endoscopy    Dr. Rowe Pavy 2008: removal of impacted food bolus, Dr. Rowe Pavy 2010: moderate gastritis, Sept 2012 with SLF: path with mild gastritis, no definite stricture noted, dilation with Savary 16 mm   Sciatica    Seizures ([place])    Sleep apnea    TIA (transient ischemic attack) 2012    Past Surgical History:  Procedure Laterality Date   ABDOMINAL HYSTERECTOMY     complete per patient   BALLOON DILATION N/A 10/05/2020   Procedure: BALLOON DILATION;  Surgeon: [name], DO;  Location: [facility];  Service: Endoscopy;  Laterality: N/A;   BLADDER SURGERY     stretch bladder opening   CHOLECYSTECTOMY     COLONOSCOPY  07/18/2011   SLF: 1. internal hemorrhoids   COLONOSCOPY WITH PROPOFOL N/A 03/21/2017   Dr. Oneida Alar: Redundant left colon, hemorrhoids, next colonoscopy in 10 years.  Patient reports deep pression after propofol.   COLONOSCOPY WITH PROPOFOL N/A 01/26/2021   Procedure: COLONOSCOPY WITH PROPOFOL;  Surgeon: [name], DO;  Location:  [facility];  Service: Endoscopy;  Laterality: N/A;  am appt   ESOPHAGEAL MANOMETRY N/A 01/21/2013   esophageal surgery?     2008 [place]   ESOPHAGOGASTRODUODENOSCOPY  07/18/2011   SLF: 1. Moderate gastritis 2. Dysphagia most likely 2o large food bolus moving through her Nissen, which appears to be intact. Pt has poor denttition and NL BPE 2 years ago. 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   ESOPHAGOGASTRODUODENOSCOPY (EGD) WITH PROPOFOL N/A 08/21/2018   dr. Oneida Alar: mild gastritis, esophagus appeared normal. s/p dilation   ESOPHAGOGASTRODUODENOSCOPY (EGD) WITH PROPOFOL N/A 10/05/2020   Nissen, benign-appearing stenosis s/p dilation, gastric polyps biospied and normal duodenum. Hyperplastic polyp and reactive gastropathy noted.    EVALUATION UNDER ANESTHESIA WITH HEMORRHOIDECTOMY N/A 03/27/2015   Procedure: EXAM UNDER ANESTHESIA WITH SPHINCTEROTOMY ;  Surgeon: Johnathan Hausen, MD;  Location: [facility];  Service: General;  Laterality: N/A;   FLEXIBLE SIGMOIDOSCOPY N/A 12/09/2014   SLF: Rectal pain most likely due to internal and external hemorroids   HAND SURGERY     carpal tumnnel right and removal of cyst left   HEMORRHOID BANDING N/A 12/09/2014   Procedure: HEMORRHOID BANDING;  Surgeon: Danie Binder, MD;  Location: [facility];  Service: Endoscopy;  Laterality: N/A;   LAPAROSCOPIC NISSEN FUNDOPLICATION N/A 0000000   Procedure: Redo LAPAROSCOPIC NISSEN FUNDOPLICATION;  Surgeon: Rodman Key  Hortencia Conradi, MD;  Location: [facility];  Service: General;  Laterality: N/A;  Forgut explortation with partial  takedown nissan funliplication from 1994 for wrap torsion and persistant dysphagia   NASAL SEPTOPLASTY W/ TURBINOPLASTY Bilateral 08/03/2016   Procedure: NASAL SEPTOPLASTY WITH TURBINATE REDUCTION;  Surgeon: Newman Pies, MD;  Location: [facility];  Service: ENT;  Laterality: Bilateral;   NECK SURGERY     removal of "knot" from neck   NISSEN FUNDOPLICATION     initially in 1994. redo in 2014    POLYPECTOMY  10/05/2020   Procedure: POLYPECTOMY;  Surgeon: Lanelle Bal, DO;  [facility]: [facility];  Service: Endoscopy;;   SAVORY DILATION  07/18/2011   Surgeon:Sandi M Fields   SAVORY DILATION  08/21/2018   Procedure: SAVORY DILATION;  Surgeon: West Bali, MD;  Location: [facility];  Service: Endoscopy;;    Family History  Problem Relation Age of Onset   Anesthesia problems Sister    Cancer Sister        breast   Hypertension Sister    Parkinson's disease Mother    Kidney disease Mother    Diabetes Mother    Heart attack Father    Cancer Father        stomach cancer   Hypertension Brother    Diabetes Brother    Arthritis Brother    Kidney disease Brother    Parkinson's disease Brother    Cancer Maternal Aunt        breast   Cancer Maternal Grandmother        breast   Multiple sclerosis Daughter    Colon cancer Cousin        4 cousins   Pseudochol deficiency Neg Hx    Hypotension Neg Hx    Malignant hyperthermia Neg Hx     Current Outpatient Medications on File Prior to Visit  Medication Sig Dispense Refill   acetaminophen (TYLENOL) 650 MG CR tablet Take 1,300 mg by mouth every 8 (eight) hours as needed for pain.     Ascorbic Acid (VITAMIN C GUMMIE PO) Take 2-3 tablets by mouth every evening.     CARAFATE 1 GM/10ML suspension Take 1 g by mouth 3 (three) times daily.     Cholecalciferol (VITAMIN D-3) 125 MCG (5000 UT) TABS Take 5,000 Units by mouth at bedtime.     diclofenac Sodium (VOLTAREN) 1 % GEL Apply 1 application  topically 2 (two) times daily.     EPINEPHrine 0.3 mg/0.3 mL IJ SOAJ injection Inject 0.3 mg into the muscle as needed for anaphylaxis.     esomeprazole (NEXIUM) 40 MG capsule Take 1 capsule (40 mg total) by mouth 2 (two) times daily before a meal. 180 capsule 0   estrogens, conjugated, (PREMARIN) 0.625 MG tablet Take 0.625 mg by mouth at bedtime.      Glucosamine-Chondroitin (MOVE FREE PO) Take 1 tablet by mouth every evening.       hydrOXYzine (ATARAX/VISTARIL) 25 MG tablet Take 25 mg by mouth at bedtime.      loratadine (CLARITIN) 10 MG tablet Take 10 mg by mouth every evening.     lubiprostone ([name]) 24 MCG capsule Take 1 capsule (24 mcg total) by mouth 2 (two) times daily with a meal. 60 capsule 11   magnesium hydroxide (DULCOLAX) 400 MG/5ML suspension Take 15 mLs by mouth daily as needed for mild constipation.     montelukast (SINGULAIR) 10 MG tablet Take 10 mg by mouth at bedtime.  Multiple Vitamin (MULTIVITAMIN WITH MINERALS) TABS tablet Take 2 tablets by mouth every evening. Women's Multivitamin     NASACORT ALLERGY 24HR 55 MCG/ACT AERO nasal inhaler Place 1-2 sprays into the nose daily as needed (allergies).     NITRO-BID 2 % ointment Apply 1 inch topically daily as needed (rectal bleeding).     ondansetron (ZOFRAN) 4 MG tablet Take 1 tablet (4 mg total) by mouth every 8 (eight) hours as needed for nausea or vomiting. 60 tablet 3   Polyethylene Glycol 400 (VISINE DRY EYE RELIEF OP) Place 1 drop into both eyes daily as needed (dry/ red eyes).     rizatriptan (MAXALT) 10 MG tablet Take 10 mg by mouth 2 (two) times daily as needed for migraine.      simethicone (MYLICON) 132 MG chewable tablet Chew 250 mg by mouth every 6 (six) hours as needed for flatulence.     Tiotropium Bromide Monohydrate (SPIRIVA RESPIMAT) 2.5 MCG/ACT AERS Inhale 1 each into the lungs daily as needed (shortness of breath).     tiZANidine (ZANAFLEX) 4 MG tablet Take 4 mg by mouth at bedtime.     traZODone (DESYREL) 50 MG tablet Take 25 mg by mouth at bedtime as needed for sleep.     Turmeric (QC TUMERIC COMPLEX PO) Take 6 tablets by mouth at bedtime.     vitamin E 1000 UNIT capsule Take 1,000 Units by mouth at bedtime.      No current facility-administered medications on file prior to visit.    Allergies  Allergen Reactions   Aspirin Rash    Stomach distress   Avelox [Moxifloxacin Hcl In Nacl] Anaphylaxis and Itching   Linzess  [Linaclotide] Swelling    Swelling, bloating, nausea, BLISTERS IN HER MOUTH   Methocarbamol     Severe Dizziness    Penicillins Anaphylaxis and Hives    Has patient had a PCN reaction causing immediate rash, facial/tongue/throat swelling, SOB or lightheadedness with hypotension: Yes Has patient had a PCN reaction causing severe rash involving mucus membranes or skin necrosis: Yes Has patient had a PCN reaction that required hospitalization: Yes Has patient had a PCN reaction occurring within the last 10 years: No If all of the above answers are "NO", then may proceed with Cephalosporin use.    Adhesive [Tape] Itching    Red and itchy under tape   Carbamazepine Other (See Comments)    Memory loss    Cephalexin Hives and Itching   Clindamycin/Lincomycin Hives and Itching    blisters   Codeine Hives and Itching   Cymbalta [Duloxetine Hcl] Other (See Comments)    Hair loss, nervousness, severe constipation, red blotches   Darvon [Propoxyphene] Hives   Dicyclomine Itching and Other (See Comments)    Nervousness   Dicyclomine Hcl Other (See Comments)   Famotidine Nausea And Vomiting   Hydrocodone-Acetaminophen Itching   Ketek [Telithromycin] Hives and Itching   Latex Itching   Macrolides And Ketolides Swelling   Meperidine Hcl Itching and Other (See Comments)    Nervousness   Metronidazole Hives and Other (See Comments)    Red blotches.   Morphine Itching and Other (See Comments)    Nervousness   Mupirocin Itching    Made blisters worse   Prednisone Hives, Swelling and Other (See Comments)    Red blotches - Can take with Benadryl   Propofol Other (See Comments)    Depression  Tolerated propofol October 2019   Protonix [Pantoprazole] Swelling   Solodyn [Minocycline Hcl Er]  Itching and Swelling   Sulfamethoxazole-Trimethoprim Hives and Swelling   Sulfonamide Derivatives Hives   Tramadol Hcl Hives and Other (See Comments)    Nervousness   Zylet [Loteprednol-Tobramycin] Hives  and Swelling    Social History   Substance and Sexual Activity  Alcohol Use Not Currently   Comment: drinks wine "every blue moon"    Social History   Tobacco Use  Smoking Status Never  Smokeless Tobacco Never    Review of Systems  Constitutional:  Positive for malaise/fatigue.  HENT:  Positive for sinus pain.   Eyes:  Positive for pain.  Respiratory: Negative.    Cardiovascular: Negative.   Gastrointestinal:  Positive for abdominal pain, heartburn and nausea.  Genitourinary: Negative.   Musculoskeletal:  Positive for back pain, joint pain and neck pain.  Skin:  Positive for rash.  Neurological:  Positive for sensory change.  Endo/Heme/Allergies: Negative.   Psychiatric/Behavioral: Negative.      Objective   Vitals:   12/01/22 1119  BP: 135/79  Pulse: 71  Resp: 12  Temp: 98.3 F (36.8 C)  SpO2: 97%    Physical Exam Vitals reviewed.  Constitutional:      Appearance: Normal appearance. She is not ill-appearing.  HENT:     Head: Normocephalic and atraumatic.  Cardiovascular:     Rate and Rhythm: Normal rate and regular rhythm.     Heart sounds: Normal heart sounds. No murmur heard.    No friction rub. No gallop.  Pulmonary:     Effort: Pulmonary effort is normal. No respiratory distress.     Breath sounds: Normal breath sounds. No stridor. No wheezing, rhonchi or rales.  Abdominal:     General: Bowel sounds are normal. There is no distension.     Palpations: Abdomen is soft. There is no mass.     Tenderness: There is abdominal tenderness. There is no guarding or rebound.     Hernia: A hernia is present.     Comments: A reducible small umbilical hernia is present, 2.5 cm in its greatest diameter.  She is tender at the base of the umbilicus with some swelling just superior to the umbilicus.  Skin:    General: Skin is warm and dry.  Neurological:     Mental Status: She is alert and oriented to person, place, and time.    Primary care notes  reviewed Assessment  Umbilical hernia, symptomatic Plan  Patient is scheduled for robotic assisted laparoscopic umbilical herniorrhaphy with mesh on 12/06/2022.  The risks and benefits of the procedure including bleeding, infection, mesh use, and the possibility of recurrence of the hernia were fully explained to the patient, who gave informed consent.

## 2022-12-02 NOTE — Progress Notes (Signed)
Yesenia Boyd; [id number]; 14-Nov-1955   HPI Patient is a 67 year old black female who was referred to my care by Dr. Sherrie Sport for evaluation and treatment of periumbilical pain.  Patient states she has had intermittent swelling around the umbilicus for over a year, but it seems to be increasing in frequency and causing her discomfort.  She denies any nausea or vomiting.  It is made worse when she is standing.  She has never had surgery in the umbilical area. Past Medical History:  Diagnosis Date   Arthritis    Asthma    Complication of anesthesia    Deep depression following administration of propofol   Depression    Fibromyalgia    GERD (gastroesophageal reflux disease)    History of hiatal hernia    2 surgeries, Lap. Nissen Fundiplication    IBS (irritable bowel syndrome)    Myasthenia gravis    her body mimics the disease   Peripheral neuropathy    S/P colonoscopy    Dr. Rowe Pavy, internal hemorrhoids, repeat in Sept 2022   S/P endoscopy    Dr. Rowe Pavy 2008: removal of impacted food bolus, Dr. Rowe Pavy 2010: moderate gastritis, Sept 2012 with SLF: path with mild gastritis, no definite stricture noted, dilation with Savary 16 mm   Sciatica    Seizures ([place])    Sleep apnea    TIA (transient ischemic attack) 2012    Past Surgical History:  Procedure Laterality Date   ABDOMINAL HYSTERECTOMY     complete per patient   BALLOON DILATION N/A 10/05/2020   Procedure: BALLOON DILATION;  Surgeon: [name], DO;  Location: [facility];  Service: Endoscopy;  Laterality: N/A;   BLADDER SURGERY     stretch bladder opening   CHOLECYSTECTOMY     COLONOSCOPY  07/18/2011   SLF: 1. internal hemorrhoids   COLONOSCOPY WITH PROPOFOL N/A 03/21/2017   Dr. Oneida Alar: Redundant left colon, hemorrhoids, next colonoscopy in 10 years.  Patient reports deep pression after propofol.   COLONOSCOPY WITH PROPOFOL N/A 01/26/2021   Procedure: COLONOSCOPY WITH PROPOFOL;  Surgeon: [name], DO;  Location:  [facility];  Service: Endoscopy;  Laterality: N/A;  am appt   ESOPHAGEAL MANOMETRY N/A 01/21/2013   esophageal surgery?     2008 [place]   ESOPHAGOGASTRODUODENOSCOPY  07/18/2011   SLF: 1. Moderate gastritis 2. Dysphagia most likely 2o large food bolus moving through her Nissen, which appears to be intact. Pt has poor denttition and NL BPE 2 years ago. 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   ESOPHAGOGASTRODUODENOSCOPY (EGD) WITH PROPOFOL N/A 08/21/2018   dr. Oneida Alar: mild gastritis, esophagus appeared normal. s/p dilation   ESOPHAGOGASTRODUODENOSCOPY (EGD) WITH PROPOFOL N/A 10/05/2020   Nissen, benign-appearing stenosis s/p dilation, gastric polyps biospied and normal duodenum. Hyperplastic polyp and reactive gastropathy noted.    EVALUATION UNDER ANESTHESIA WITH HEMORRHOIDECTOMY N/A 03/27/2015   Procedure: EXAM UNDER ANESTHESIA WITH SPHINCTEROTOMY ;  Surgeon: Johnathan Hausen, MD;  Location: [facility];  Service: General;  Laterality: N/A;   FLEXIBLE SIGMOIDOSCOPY N/A 12/09/2014   SLF: Rectal pain most likely due to internal and external hemorroids   HAND SURGERY     carpal tumnnel right and removal of cyst left   HEMORRHOID BANDING N/A 12/09/2014   Procedure: HEMORRHOID BANDING;  Surgeon: Danie Binder, MD;  Location: [facility];  Service: Endoscopy;  Laterality: N/A;   LAPAROSCOPIC NISSEN FUNDOPLICATION N/A 0000000   Procedure: Redo LAPAROSCOPIC NISSEN FUNDOPLICATION;  Surgeon: Rodman Key  Hortencia Conradi, MD;  Location: [facility];  Service: General;  Laterality: N/A;  Forgut explortation with partial  takedown nissan funliplication from 1994 for wrap torsion and persistant dysphagia   NASAL SEPTOPLASTY W/ TURBINOPLASTY Bilateral 08/03/2016   Procedure: NASAL SEPTOPLASTY WITH TURBINATE REDUCTION;  Surgeon: Newman Pies, MD;  Location: [facility];  Service: ENT;  Laterality: Bilateral;   NECK SURGERY     removal of "knot" from neck   NISSEN FUNDOPLICATION     initially in 1994. redo in 2014    POLYPECTOMY  10/05/2020   Procedure: POLYPECTOMY;  Surgeon: Lanelle Bal, DO;  Location: [facility];  Service: Endoscopy;;   SAVORY DILATION  07/18/2011   Surgeon:Sandi M Fields   SAVORY DILATION  08/21/2018   Procedure: SAVORY DILATION;  Surgeon: West Bali, MD;  Location: [facility];  Service: Endoscopy;;    Family History  Problem Relation Age of Onset   Anesthesia problems Sister    Cancer Sister        breast   Hypertension Sister    Parkinson's disease Mother    Kidney disease Mother    Diabetes Mother    Heart attack Father    Cancer Father        stomach cancer   Hypertension Brother    Diabetes Brother    Arthritis Brother    Kidney disease Brother    Parkinson's disease Brother    Cancer Maternal Aunt        breast   Cancer Maternal Grandmother        breast   Multiple sclerosis Daughter    Colon cancer Cousin        4 cousins   Pseudochol deficiency Neg Hx    Hypotension Neg Hx    Malignant hyperthermia Neg Hx     Current Outpatient Medications on File Prior to Visit  Medication Sig Dispense Refill   acetaminophen (TYLENOL) 650 MG CR tablet Take 1,300 mg by mouth every 8 (eight) hours as needed for pain.     Ascorbic Acid (VITAMIN C GUMMIE PO) Take 2-3 tablets by mouth every evening.     CARAFATE 1 GM/10ML suspension Take 1 g by mouth 3 (three) times daily.     Cholecalciferol (VITAMIN D-3) 125 MCG (5000 UT) TABS Take 5,000 Units by mouth at bedtime.     diclofenac Sodium (VOLTAREN) 1 % GEL Apply 1 application  topically 2 (two) times daily.     EPINEPHrine 0.3 mg/0.3 mL IJ SOAJ injection Inject 0.3 mg into the muscle as needed for anaphylaxis.     esomeprazole (NEXIUM) 40 MG capsule Take 1 capsule (40 mg total) by mouth 2 (two) times daily before a meal. 180 capsule 0   estrogens, conjugated, (PREMARIN) 0.625 MG tablet Take 0.625 mg by mouth at bedtime.      Glucosamine-Chondroitin (MOVE FREE PO) Take 1 tablet by mouth every evening.       hydrOXYzine (ATARAX/VISTARIL) 25 MG tablet Take 25 mg by mouth at bedtime.      loratadine (CLARITIN) 10 MG tablet Take 10 mg by mouth every evening.     lubiprostone ([name]) 24 MCG capsule Take 1 capsule (24 mcg total) by mouth 2 (two) times daily with a meal. 60 capsule 11   magnesium hydroxide (DULCOLAX) 400 MG/5ML suspension Take 15 mLs by mouth daily as needed for mild constipation.     montelukast (SINGULAIR) 10 MG tablet Take 10 mg by mouth at bedtime.  Multiple Vitamin (MULTIVITAMIN WITH MINERALS) TABS tablet Take 2 tablets by mouth every evening. Women's Multivitamin     NASACORT ALLERGY 24HR 55 MCG/ACT AERO nasal inhaler Place 1-2 sprays into the nose daily as needed (allergies).     NITRO-BID 2 % ointment Apply 1 inch topically daily as needed (rectal bleeding).     ondansetron (ZOFRAN) 4 MG tablet Take 1 tablet (4 mg total) by mouth every 8 (eight) hours as needed for nausea or vomiting. 60 tablet 3   Polyethylene Glycol 400 (VISINE DRY EYE RELIEF OP) Place 1 drop into both eyes daily as needed (dry/ red eyes).     rizatriptan (MAXALT) 10 MG tablet Take 10 mg by mouth 2 (two) times daily as needed for migraine.      simethicone (MYLICON) 132 MG chewable tablet Chew 250 mg by mouth every 6 (six) hours as needed for flatulence.     Tiotropium Bromide Monohydrate (SPIRIVA RESPIMAT) 2.5 MCG/ACT AERS Inhale 1 each into the lungs daily as needed (shortness of breath).     tiZANidine (ZANAFLEX) 4 MG tablet Take 4 mg by mouth at bedtime.     traZODone (DESYREL) 50 MG tablet Take 25 mg by mouth at bedtime as needed for sleep.     Turmeric (QC TUMERIC COMPLEX PO) Take 6 tablets by mouth at bedtime.     vitamin E 1000 UNIT capsule Take 1,000 Units by mouth at bedtime.      No current facility-administered medications on file prior to visit.    Allergies  Allergen Reactions   Aspirin Rash    Stomach distress   Avelox [Moxifloxacin Hcl In Nacl] Anaphylaxis and Itching   Linzess  [Linaclotide] Swelling    Swelling, bloating, nausea, BLISTERS IN HER MOUTH   Methocarbamol     Severe Dizziness    Penicillins Anaphylaxis and Hives    Has patient had a PCN reaction causing immediate rash, facial/tongue/throat swelling, SOB or lightheadedness with hypotension: Yes Has patient had a PCN reaction causing severe rash involving mucus membranes or skin necrosis: Yes Has patient had a PCN reaction that required hospitalization: Yes Has patient had a PCN reaction occurring within the last 10 years: No If all of the above answers are "NO", then may proceed with Cephalosporin use.    Adhesive [Tape] Itching    Red and itchy under tape   Carbamazepine Other (See Comments)    Memory loss    Cephalexin Hives and Itching   Clindamycin/Lincomycin Hives and Itching    blisters   Codeine Hives and Itching   Cymbalta [Duloxetine Hcl] Other (See Comments)    Hair loss, nervousness, severe constipation, red blotches   Darvon [Propoxyphene] Hives   Dicyclomine Itching and Other (See Comments)    Nervousness   Dicyclomine Hcl Other (See Comments)   Famotidine Nausea And Vomiting   Hydrocodone-Acetaminophen Itching   Ketek [Telithromycin] Hives and Itching   Latex Itching   Macrolides And Ketolides Swelling   Meperidine Hcl Itching and Other (See Comments)    Nervousness   Metronidazole Hives and Other (See Comments)    Red blotches.   Morphine Itching and Other (See Comments)    Nervousness   Mupirocin Itching    Made blisters worse   Prednisone Hives, Swelling and Other (See Comments)    Red blotches - Can take with Benadryl   Propofol Other (See Comments)    Depression  Tolerated propofol October 2019   Protonix [Pantoprazole] Swelling   Solodyn [Minocycline Hcl Er]  Itching and Swelling   Sulfamethoxazole-Trimethoprim Hives and Swelling   Sulfonamide Derivatives Hives   Tramadol Hcl Hives and Other (See Comments)    Nervousness   Zylet [Loteprednol-Tobramycin] Hives  and Swelling    Social History   Substance and Sexual Activity  Alcohol Use Not Currently   Comment: drinks wine "every blue moon"    Social History   Tobacco Use  Smoking Status Never  Smokeless Tobacco Never    Review of Systems  Constitutional:  Positive for malaise/fatigue.  HENT:  Positive for sinus pain.   Eyes:  Positive for pain.  Respiratory: Negative.    Cardiovascular: Negative.   Gastrointestinal:  Positive for abdominal pain, heartburn and nausea.  Genitourinary: Negative.   Musculoskeletal:  Positive for back pain, joint pain and neck pain.  Skin:  Positive for rash.  Neurological:  Positive for sensory change.  Endo/Heme/Allergies: Negative.   Psychiatric/Behavioral: Negative.      Objective   Vitals:   12/01/22 1119  BP: 135/79  Pulse: 71  Resp: 12  Temp: 98.3 F (36.8 C)  SpO2: 97%    Physical Exam Vitals reviewed.  Constitutional:      Appearance: Normal appearance. She is not ill-appearing.  HENT:     Head: Normocephalic and atraumatic.  Cardiovascular:     Rate and Rhythm: Normal rate and regular rhythm.     Heart sounds: Normal heart sounds. No murmur heard.    No friction rub. No gallop.  Pulmonary:     Effort: Pulmonary effort is normal. No respiratory distress.     Breath sounds: Normal breath sounds. No stridor. No wheezing, rhonchi or rales.  Abdominal:     General: Bowel sounds are normal. There is no distension.     Palpations: Abdomen is soft. There is no mass.     Tenderness: There is abdominal tenderness. There is no guarding or rebound.     Hernia: A hernia is present.     Comments: A reducible small umbilical hernia is present, 2.5 cm in its greatest diameter.  She is tender at the base of the umbilicus with some swelling just superior to the umbilicus.  Skin:    General: Skin is warm and dry.  Neurological:     Mental Status: She is alert and oriented to person, place, and time.    Primary care notes  reviewed Assessment  Umbilical hernia, symptomatic Plan  Patient is scheduled for robotic assisted laparoscopic umbilical herniorrhaphy with mesh on 12/06/2022.  The risks and benefits of the procedure including bleeding, infection, mesh use, and the possibility of recurrence of the hernia were fully explained to the patient, who gave informed consent.

## 2022-12-02 NOTE — Patient Instructions (Signed)
Yesenia Boyd  12/02/2022     @PREFPERIOPPHARMACY @   Your procedure is scheduled on 12/06/2022.  Report to [facility] at 8:00 A.M.  Call this number if you have problems the morning of surgery:  [phone]  If you experience any cold or flu symptoms such as cough, fever, chills, shortness of breath, etc. between now and your scheduled surgery, please notify [phone] at the above number.   Remember:    Do not eat or drink after midnight.     Take these medicines the morning of surgery with A SIP OF WATER : Nexium Zofran (if needed) and Maxalt    Do not wear jewelry, make-up or nail polish.  Do not wear lotions, powders, or perfumes, or deodorant.  Do not shave 48 hours prior to surgery.  Men may shave face and neck.  Do not bring valuables to the hospital.  [facility] is not responsible for any belongings or valuables.  Contacts, dentures or bridgework may not be worn into surgery.  Leave your suitcase in the car.  After surgery it may be brought to your room.  For patients admitted to the hospital, discharge time will be determined by your treatment team.  Patients discharged the day of surgery will not be allowed to drive home.   Name and phone number of your driver:   Family Special instructions:  N/A  Please read over the following fact sheets that you were given. Care and Recovery After Surgery  Laparoscopic Ventral Hernia Repair Laparoscopic ventral hernia repairis a procedure to fix a bulge of tissue that pushes through a weak area of muscle in the abdomen (ventral hernia). A ventral hernia may be: Above the belly button. This is called an epigastric hernia. At the belly button. This is called an umbilical hernia. At the incision site from previous abdominal surgery. This is called an incisional hernia. You may have this procedure as emergency surgery if part of your intestine gets trapped inside the hernia and starts to lose its blood supply  (strangulation). Laparoscopic surgery is done through small incisions using a thin surgical telescope with a light and camera (laparoscope). During surgery, your surgeon will use images from the laparoscope to guide the procedure. A mesh screen will be placed in the hernia to close the opening and strengthen the abdominal wall. Tell a health care provider about: Any allergies you have. All medicines you are taking, including vitamins, herbs, eye drops, creams, and over-the-counter medicines. Any problems you or family members have had with anesthetic medicines. Any blood disorders you have. Any surgeries you have had. Any medical conditions you have. Whether you are pregnant or may be pregnant. What are the risks? Generally, this is a safe procedure. However, problems may occur, including: Infection. Bleeding. Damage to nearby structures or organs in the abdomen. Trouble urinating or having a bowel movement after surgery. Blood clots. The hernia coming back after surgery. Fluid buildup in the area of the hernia. In some cases, your health care provider may need to switch from a laparoscopic procedure to a procedure that is done through a single, larger incision in the abdomen (open procedure). You may need an open procedure if: You have a hernia that is difficult to repair. Your organs are hard to see with the laparoscope. You have bleeding problems during the laparoscopic procedure. What happens before the procedure? Staying hydrated Follow instructions from your health care provider about hydration, which may include: Up to 2 hours before the  procedure - you may continue to drink clear liquids, such as water, clear fruit juice, black coffee, and plain tea.  Eating and drinking restrictions Follow instructions from your health care provider about eating and drinking, which may include: 8 hours before the procedure - stop eating heavy meals or foods, such as meat, fried foods, or fatty  foods. 6 hours before the procedure - stop eating light meals or foods, such as toast or cereal. 6 hours before the procedure - stop drinking milk or drinks that contain milk. 2 hours before the procedure - stop drinking clear liquids. Medicines Ask your health care provider about: Changing or stopping your regular medicines. This is especially important if you are taking diabetes medicines or blood thinners. Taking medicines such as aspirin and ibuprofen. These medicines can thin your blood. Do not take these medicines unless your health care provider tells you to take them. Taking over-the-counter medicines, vitamins, herbs, and supplements. Tests You may need to have tests before the procedure, such as: Blood tests. Urine tests. Abdominal ultrasound. Chest X-ray. Electrocardiogram (ECG). General instructions You may be asked to take a laxative or do an enema to empty your bowel before surgery (bowel prep). Do not use any products that contain nicotine or tobacco for at least 4 weeks before the procedure. These products include cigarettes, chewing tobacco, and vaping devices, such as e-cigarettes. If you need help quitting, ask your health care provider. Ask your health care provider: How your surgery site will be marked. What steps will be taken to help prevent infection. These steps may include: Removing hair at the surgery site. Washing skin with a germ-killing soap. Receiving antibiotic medicine. Plan to have a responsible adult take you home from the hospital or clinic. If you will be going home right after the procedure, plan to have a responsible adult care for you for the time you are told. This is important. What happens during the procedure?  An IV will be inserted into one of your veins. You will be given one or more of the following: A medicine to help you relax (sedative). A medicine to numb the area (local anesthetic). A medicine to make you fall asleep (general  anesthetic). A small incision will be made in your abdomen. A hollow metal tube (trocar) will be placed through the incision. A tube will be placed through the trocar to inflate your abdomen with carbon dioxide. This makes it easier for your surgeon to see inside your abdomen during the repair. A laparoscope will be inserted into your abdomen through the trocar. The laparoscope will send images to a monitor in the operating room. Other trocars will be put through other small incisions in your abdomen. The surgical instruments needed for the procedure will be placed through these trocars. The tissue or intestines that make up the hernia will be moved back into place. The edges of the hernia may be stitched (sutured) together. A piece of mesh will be used to close the hernia. Sutures, clips, or staples will be used to keep the mesh in place. A bandage (dressing) or skin glue will be put over the incisions. The procedure may vary among health care providers and hospitals. What happens after the procedure? Your blood pressure, heart rate, breathing rate, and blood oxygen level will be monitored until you leave the hospital or clinic. You will continue to receive fluids and medicines through an IV. Your IV will be removed when you can drink clear fluids. You will be given  pain medicine as needed. You will be encouraged to get up and walk around as soon as possible. You will be shown how to do deep breathing exercises to help prevent a lung infection. If you were given a sedative during the procedure, it can affect you for several hours. Do not drive or operate machinery until your health care provider says that it is safe. Summary Laparoscopic ventral hernia is an operation to fix a hernia using small incisions. Tell your health care provider about other medical conditions that you have and about all the medicines that you are taking. Follow instructions from your health care provider about eating and  drinking before the procedure. Plan to have a responsible adult take you home from the hospital or clinic. After the procedure, you will be encouraged to walk as soon as possible. You will also be taught how to do deep breathing exercises. This information is not intended to replace advice given to you by your health care provider. Make sure you discuss any questions you have with your health care provider. Document Revised: 06/12/2020 Document Reviewed: 06/12/2020 Elsevier Patient Education  2023 [employer]    General Anesthesia, Adult General anesthesia is the use of medicine to make you fall asleep (unconscious) for a medical procedure. General anesthesia must be used for certain procedures. It is often recommended for surgery or procedures that: Last a long time. Require you to be still or in an unusual position. Are major and can cause blood loss. Affect your breathing. The medicines used for general anesthesia are called general anesthetics. During general anesthesia, these medicines are given along with medicines that: Prevent pain. Control your blood pressure. Relax your muscles. Prevent nausea and vomiting after the procedure. Tell a health care provider about: Any allergies you have. All medicines you are taking, including vitamins, herbs, eye drops, creams, and over-the-counter medicines. Your history of any: Medical conditions you have, including: High blood pressure. Bleeding problems. Diabetes. Heart or lung conditions, such as: Heart failure. Sleep apnea. Asthma. Chronic obstructive pulmonary disease (COPD). Current or recent illnesses, such as: Upper respiratory, chest, or ear infections. Cough or fever. Tobacco or drug use, including marijuana or alcohol use. Depression or anxiety. Surgeries and types of anesthetics you have had. Problems you or family members have had with anesthetic medicines. Whether you are pregnant or may be pregnant. Whether you  have any chipped or loose teeth, dentures, caps, bridgework, or issues with your mouth, swallowing, or choking. What are the risks? Your health care provider will talk with you about risks. These may include: Allergic reaction to the medicines. Lung and heart problems. Inhaling food or liquid from the stomach into the lungs (aspiration). Nerve injury. Injury to the lips, mouth, teeth, or gums. Stroke. Waking up during your procedure and being unable to move. This is rare. These problems are more likely to develop if you are having a major surgery or if you have an advanced or serious medical condition. You can prevent some of these complications by answering all of your health care provider's questions thoroughly and by following all instructions before your procedure. General anesthesia can cause side effects, including: Nausea or vomiting. A sore throat or hoarseness from the breathing tube. Wheezing or coughing. Shaking chills or feeling cold. Body aches. Sleepiness. Confusion, agitation (delirium), or anxiety. What happens before the procedure? When to stop eating and drinking Follow instructions from your health care provider about what you may eat and drink before your procedure. If you  do not follow your health care provider's instructions, your procedure may be delayed or canceled. Medicines Ask your health care provider about: Changing or stopping your regular medicines. These include any diabetes medicines or blood thinners you take. Taking medicines such as aspirin and ibuprofen. These medicines can thin your blood. Do not take them unless your health care provider tells you to. Taking over-the-counter medicines, vitamins, herbs, and supplements. General instructions Do not use any products that contain nicotine or tobacco for at least 4 weeks before the procedure. These products include cigarettes, chewing tobacco, and vaping devices, such as e-cigarettes. If you need help  quitting, ask your health care provider. If you brush your teeth on the morning of the procedure, make sure to spit out all of the water and toothpaste. If told by your health care provider, bring your sleep apnea device with you to surgery (if applicable). If you will be going home right after the procedure, plan to have a responsible adult: Take you home from the hospital or clinic. You will not be allowed to drive. Care for you for the time you are told. What happens during the procedure?  An IV will be inserted into one of your veins. You will be given one or more of the following through a face mask or IV: A sedative. This helps you relax. Anesthesia. This will: Numb certain areas of your body. Make you fall asleep for surgery. After you are unconscious, a breathing tube may be inserted down your throat to help you breathe. This will be removed before you wake up. An anesthesia provider, such as an anesthesiologist, will stay with you throughout your procedure. The anesthesia provider will: Keep you comfortable and safe by continuing to give you medicines and adjusting the amount of medicine that you get. Monitor your blood pressure, heart rate, and oxygen levels to make sure that the anesthetics do not cause any problems. The procedure may vary among health care providers and hospitals. What happens after the procedure? Your blood pressure, temperature, heart rate, breathing rate, and blood oxygen level will be monitored until you leave the hospital or clinic. You will wake up in a recovery area. You may wake up slowly. You may be given medicine to help you with pain, nausea, or any other side effects from the anesthesia. Summary General anesthesia is the use of medicine to make you fall asleep (unconscious) for a medical procedure. Follow your health care provider's instructions about when to stop eating, drinking, or taking certain medicines before your procedure. Plan to have a  responsible adult take you home from the hospital or clinic. This information is not intended to replace advice given to you by your health care provider. Make sure you discuss any questions you have with your health care provider. Document Revised: 01/20/2022 Document Reviewed: 01/20/2022 Elsevier Patient Education  2023 [employer]  How to Use Chlorhexidine Before Surgery Chlorhexidine gluconate (CHG) is a germ-killing (antiseptic) solution that is used to clean the skin. It can get rid of the bacteria that normally live on the skin and can keep them away for about 24 hours. To clean your skin with CHG, you may be given: A CHG solution to use in the shower or as part of a sponge bath. A prepackaged cloth that contains CHG. Cleaning your skin with CHG may help lower the risk for infection: While you are staying in the intensive care unit of the hospital. If you have a vascular access, such as a  central line, to provide short-term or long-term access to your veins. If you have a catheter to drain urine from your bladder. If you are on a ventilator. A ventilator is a machine that helps you breathe by moving air in and out of your lungs. After surgery. What are the risks? Risks of using CHG include: A skin reaction. Hearing loss, if CHG gets in your ears and you have a perforated eardrum. Eye injury, if CHG gets in your eyes and is not rinsed out. The CHG product catching fire. Make sure that you avoid smoking and flames after applying CHG to your skin. Do not use CHG: If you have a chlorhexidine allergy or have previously reacted to chlorhexidine. On babies younger than 502 months of age. How to use CHG solution Use CHG only as told by your health care provider, and follow the instructions on the label. Use the full amount of CHG as directed. Usually, this is one bottle. During a shower Follow these steps when using CHG solution during a shower (unless your health care provider gives you  different instructions): Start the shower. Use your normal soap and shampoo to wash your face and hair. Turn off the shower or move out of the shower stream. Pour the CHG onto a clean washcloth. Do not use any type of brush or rough-edged sponge. Starting at your neck, lather your body down to your toes. Make sure you follow these instructions: If you will be having surgery, pay special attention to the part of your body where you will be having surgery. Scrub this area for at least 1 minute. Do not use CHG on your head or face. If the solution gets into your ears or eyes, rinse them well with water. Avoid your genital area. Avoid any areas of skin that have broken skin, cuts, or scrapes. Scrub your back and under your arms. Make sure to wash skin folds. Let the lather sit on your skin for 1-2 minutes or as long as told by your health care provider. Thoroughly rinse your entire body in the shower. Make sure that all body creases and crevices are rinsed well. Dry off with a clean towel. Do not put any substances on your body afterward--such as powder, lotion, or perfume--unless you are told to do so by your health care provider. Only use lotions that are recommended by the manufacturer. Put on clean clothes or pajamas. If it is the night before your surgery, sleep in clean sheets.  During a sponge bath Follow these steps when using CHG solution during a sponge bath (unless your health care provider gives you different instructions): Use your normal soap and shampoo to wash your face and hair. Pour the CHG onto a clean washcloth. Starting at your neck, lather your body down to your toes. Make sure you follow these instructions: If you will be having surgery, pay special attention to the part of your body where you will be having surgery. Scrub this area for at least 1 minute. Do not use CHG on your head or face. If the solution gets into your ears or eyes, rinse them well with water. Avoid your  genital area. Avoid any areas of skin that have broken skin, cuts, or scrapes. Scrub your back and under your arms. Make sure to wash skin folds. Let the lather sit on your skin for 1-2 minutes or as long as told by your health care provider. Using a different clean, wet washcloth, thoroughly rinse your entire body.  Make sure that all body creases and crevices are rinsed well. Dry off with a clean towel. Do not put any substances on your body afterward--such as powder, lotion, or perfume--unless you are told to do so by your health care provider. Only use lotions that are recommended by the manufacturer. Put on clean clothes or pajamas. If it is the night before your surgery, sleep in clean sheets. How to use CHG prepackaged cloths Only use CHG cloths as told by your health care provider, and follow the instructions on the label. Use the CHG cloth on clean, dry skin. Do not use the CHG cloth on your head or face unless your health care provider tells you to. When washing with the CHG cloth: Avoid your genital area. Avoid any areas of skin that have broken skin, cuts, or scrapes. Before surgery Follow these steps when using a CHG cloth to clean before surgery (unless your health care provider gives you different instructions): Using the CHG cloth, vigorously scrub the part of your body where you will be having surgery. Scrub using a back-and-forth motion for 3 minutes. The area on your body should be completely wet with CHG when you are done scrubbing. Do not rinse. Discard the cloth and let the area air-dry. Do not put any substances on the area afterward, such as powder, lotion, or perfume. Put on clean clothes or pajamas. If it is the night before your surgery, sleep in clean sheets.  For general bathing Follow these steps when using CHG cloths for general bathing (unless your health care provider gives you different instructions). Use a separate CHG cloth for each area of your body. Make  sure you wash between any folds of skin and between your fingers and toes. Wash your body in the following order, switching to a new cloth after each step: The front of your neck, shoulders, and chest. Both of your arms, under your arms, and your hands. Your stomach and groin area, avoiding the genitals. Your right leg and foot. Your left leg and foot. The back of your neck, your back, and your buttocks. Do not rinse. Discard the cloth and let the area air-dry. Do not put any substances on your body afterward--such as powder, lotion, or perfume--unless you are told to do so by your health care provider. Only use lotions that are recommended by the manufacturer. Put on clean clothes or pajamas. Contact a health care provider if: Your skin gets irritated after scrubbing. You have questions about using your solution or cloth. You swallow any chlorhexidine. Call your local poison control center (1-270-219-3481 in the [place] Get help right away if: Your eyes itch badly, or they become very red or swollen. Your skin itches badly and is red or swollen. Your hearing changes. You have trouble seeing. You have swelling or tingling in your mouth or throat. You have trouble breathing. These symptoms may represent a serious problem that is an emergency. Do not wait to see if the symptoms will go away. Get medical help right away. Call your local emergency services (911 in the [place] Do not drive yourself to the hospital. Summary Chlorhexidine gluconate (CHG) is a germ-killing (antiseptic) solution that is used to clean the skin. Cleaning your skin with CHG may help to lower your risk for infection. You may be given CHG to use for bathing. It may be in a bottle or in a prepackaged cloth to use on your skin. Carefully follow your health care provider's instructions and the instructions  on the product label. Do not use CHG if you have a chlorhexidine allergy. Contact your health care provider if your skin  gets irritated after scrubbing. This information is not intended to replace advice given to you by your health care provider. Make sure you discuss any questions you have with your health care provider. Document Revised: 02/21/2022 Document Reviewed: 01/04/2021 Elsevier Patient Education  [place].

## 2022-12-05 ENCOUNTER — Encounter (HOSPITAL_COMMUNITY): Payer: Self-pay | Admitting: Internal Medicine

## 2022-12-06 ENCOUNTER — Ambulatory Visit (HOSPITAL_BASED_OUTPATIENT_CLINIC_OR_DEPARTMENT_OTHER): Payer: Medicare HMO | Admitting: Anesthesiology

## 2022-12-06 ENCOUNTER — Other Ambulatory Visit: Payer: Self-pay

## 2022-12-06 ENCOUNTER — Encounter (HOSPITAL_COMMUNITY): Payer: Self-pay | Admitting: General Surgery

## 2022-12-06 ENCOUNTER — Encounter (HOSPITAL_COMMUNITY)
Admission: RE | Disposition: A | Discharge status: Home / Self Care | Payer: Self-pay | Source: Home / Self Care | Attending: General Surgery

## 2022-12-06 ENCOUNTER — Ambulatory Visit (HOSPITAL_COMMUNITY)
Admission: RE | Admit: 2022-12-06 | Discharge: 2022-12-06 | Disposition: A | Discharge status: Home / Self Care | Payer: Medicare HMO | Attending: General Surgery | Admitting: General Surgery

## 2022-12-06 ENCOUNTER — Ambulatory Visit (HOSPITAL_COMMUNITY): Payer: Medicare HMO | Admitting: Anesthesiology

## 2022-12-06 DIAGNOSIS — F32A Depression, unspecified: Secondary | ICD-10-CM | POA: Insufficient documentation

## 2022-12-06 DIAGNOSIS — M797 Fibromyalgia: Secondary | ICD-10-CM | POA: Insufficient documentation

## 2022-12-06 DIAGNOSIS — R110 Nausea: Secondary | ICD-10-CM | POA: Insufficient documentation

## 2022-12-06 DIAGNOSIS — M549 Dorsalgia, unspecified: Secondary | ICD-10-CM | POA: Diagnosis not present

## 2022-12-06 DIAGNOSIS — J45909 Unspecified asthma, uncomplicated: Secondary | ICD-10-CM | POA: Insufficient documentation

## 2022-12-06 DIAGNOSIS — K449 Diaphragmatic hernia without obstruction or gangrene: Secondary | ICD-10-CM | POA: Insufficient documentation

## 2022-12-06 DIAGNOSIS — R569 Unspecified convulsions: Secondary | ICD-10-CM | POA: Diagnosis not present

## 2022-12-06 DIAGNOSIS — G4730 Sleep apnea, unspecified: Secondary | ICD-10-CM | POA: Diagnosis not present

## 2022-12-06 DIAGNOSIS — K429 Umbilical hernia without obstruction or gangrene: Secondary | ICD-10-CM | POA: Insufficient documentation

## 2022-12-06 DIAGNOSIS — K660 Peritoneal adhesions (postprocedural) (postinfection): Secondary | ICD-10-CM | POA: Diagnosis not present

## 2022-12-06 DIAGNOSIS — M2550 Pain in unspecified joint: Secondary | ICD-10-CM | POA: Insufficient documentation

## 2022-12-06 DIAGNOSIS — R5381 Other malaise: Secondary | ICD-10-CM | POA: Diagnosis not present

## 2022-12-06 DIAGNOSIS — K219 Gastro-esophageal reflux disease without esophagitis: Secondary | ICD-10-CM | POA: Insufficient documentation

## 2022-12-06 DIAGNOSIS — R5383 Other fatigue: Secondary | ICD-10-CM | POA: Diagnosis not present

## 2022-12-06 DIAGNOSIS — M1990 Unspecified osteoarthritis, unspecified site: Secondary | ICD-10-CM | POA: Insufficient documentation

## 2022-12-06 DIAGNOSIS — M542 Cervicalgia: Secondary | ICD-10-CM | POA: Insufficient documentation

## 2022-12-06 HISTORY — PX: LAPAROSCOPIC LYSIS OF ADHESIONS: SHX5905

## 2022-12-06 SURGERY — REPAIR, HERNIA, UMBILICAL, ROBOT-ASSISTED
Anesthesia: General | Site: Abdomen

## 2022-12-06 MED ORDER — STERILE WATER FOR IRRIGATION IR SOLN
Status: DC | PRN
  Administered 2022-12-06: 500 mL

## 2022-12-06 MED ORDER — VANCOMYCIN HCL IN DEXTROSE 1-5 GM/200ML-% IV SOLN
INTRAVENOUS | Status: AC
  Filled 2022-12-06: qty 200

## 2022-12-06 MED ORDER — LACTATED RINGERS IV SOLN: INTRAVENOUS | Status: DC | PRN

## 2022-12-06 MED ORDER — PROPOFOL 10 MG/ML IV BOLUS
INTRAVENOUS | Status: DC | PRN
  Administered 2022-12-06: 140 mg via INTRAVENOUS

## 2022-12-06 MED ORDER — MIDAZOLAM HCL 2 MG/2ML IJ SOLN
INTRAMUSCULAR | Status: AC
  Filled 2022-12-06: qty 2

## 2022-12-06 MED ORDER — SEVOFLURANE IN SOLN
RESPIRATORY_TRACT | Status: AC
  Filled 2022-12-06: qty 250

## 2022-12-06 MED ORDER — SUCCINYLCHOLINE CHLORIDE 200 MG/10ML IV SOSY
PREFILLED_SYRINGE | INTRAVENOUS | Status: DC | PRN
  Administered 2022-12-06: 100 mg via INTRAVENOUS

## 2022-12-06 MED ORDER — ROCURONIUM BROMIDE 10 MG/ML (PF) SYRINGE
PREFILLED_SYRINGE | INTRAVENOUS | Status: AC
  Filled 2022-12-06: qty 10

## 2022-12-06 MED ORDER — VANCOMYCIN HCL IN DEXTROSE 1-5 GM/200ML-% IV SOLN
1000.0000 mg | Freq: Once | INTRAVENOUS | Status: AC
  Administered 2022-12-06: 1000 mg via INTRAVENOUS

## 2022-12-06 MED ORDER — MIDAZOLAM HCL 2 MG/2ML IJ SOLN
2.0000 mg | Freq: Once | INTRAMUSCULAR | Status: AC
  Administered 2022-12-06: 2 mg via INTRAVENOUS

## 2022-12-06 MED ORDER — HYDROMORPHONE HCL 1 MG/ML IJ SOLN
0.2500 mg | INTRAMUSCULAR | Status: DC | PRN
  Administered 2022-12-06 (×2): 0.5 mg via INTRAVENOUS
  Filled 2022-12-06: qty 0.5

## 2022-12-06 MED ORDER — ROCURONIUM BROMIDE 10 MG/ML (PF) SYRINGE
PREFILLED_SYRINGE | INTRAVENOUS | Status: DC | PRN
  Administered 2022-12-06: 50 mg via INTRAVENOUS
  Administered 2022-12-06: 10 mg via INTRAVENOUS

## 2022-12-06 MED ORDER — CHLORHEXIDINE GLUCONATE CLOTH 2 % EX PADS: 6.0000 | MEDICATED_PAD | Freq: Once | CUTANEOUS | Status: DC

## 2022-12-06 MED ORDER — FENTANYL CITRATE (PF) 100 MCG/2ML IJ SOLN
INTRAMUSCULAR | Status: AC
  Filled 2022-12-06: qty 2

## 2022-12-06 MED ORDER — CHLORHEXIDINE GLUCONATE CLOTH 2 % EX PADS
6.0000 | MEDICATED_PAD | Freq: Once | CUTANEOUS | Status: AC
  Administered 2022-12-06: 6 via TOPICAL

## 2022-12-06 MED ORDER — PROPOFOL 10 MG/ML IV BOLUS
INTRAVENOUS | Status: AC
  Filled 2022-12-06: qty 20

## 2022-12-06 MED ORDER — HYDROMORPHONE HCL 1 MG/ML IJ SOLN
INTRAMUSCULAR | Status: AC
  Filled 2022-12-06: qty 0.5

## 2022-12-06 MED ORDER — METOCLOPRAMIDE HCL 5 MG/ML IJ SOLN
10.0000 mg | Freq: Once | INTRAMUSCULAR | Status: AC
  Administered 2022-12-06: 10 mg via INTRAVENOUS
  Filled 2022-12-06: qty 2

## 2022-12-06 MED ORDER — LIDOCAINE HCL (PF) 2 % IJ SOLN
INTRAMUSCULAR | Status: AC
  Filled 2022-12-06: qty 5

## 2022-12-06 MED ORDER — SUGAMMADEX SODIUM 200 MG/2ML IV SOLN
INTRAVENOUS | Status: DC | PRN
  Administered 2022-12-06: 200 mg via INTRAVENOUS

## 2022-12-06 MED ORDER — LIDOCAINE 2% (20 MG/ML) 5 ML SYRINGE
INTRAMUSCULAR | Status: DC | PRN
  Administered 2022-12-06: 80 mg via INTRAVENOUS

## 2022-12-06 MED ORDER — ONDANSETRON HCL 4 MG/2ML IJ SOLN
INTRAMUSCULAR | Status: AC
  Filled 2022-12-06: qty 2

## 2022-12-06 MED ORDER — DEXAMETHASONE SODIUM PHOSPHATE 10 MG/ML IJ SOLN
INTRAMUSCULAR | Status: DC | PRN
  Administered 2022-12-06: 10 mg via INTRAVENOUS

## 2022-12-06 MED ORDER — BUPIVACAINE LIPOSOME 1.3 % IJ SUSP
INTRAMUSCULAR | Status: DC | PRN
  Administered 2022-12-06: 20 mL

## 2022-12-06 MED ORDER — DEXAMETHASONE SODIUM PHOSPHATE 10 MG/ML IJ SOLN
INTRAMUSCULAR | Status: AC
  Filled 2022-12-06: qty 1

## 2022-12-06 MED ORDER — FENTANYL CITRATE (PF) 250 MCG/5ML IJ SOLN
INTRAMUSCULAR | Status: AC
  Filled 2022-12-06: qty 5

## 2022-12-06 MED ORDER — BUPIVACAINE LIPOSOME 1.3 % IJ SUSP
INTRAMUSCULAR | Status: AC
  Filled 2022-12-06: qty 20

## 2022-12-06 MED ORDER — SUCCINYLCHOLINE CHLORIDE 200 MG/10ML IV SOSY
PREFILLED_SYRINGE | INTRAVENOUS | Status: AC
  Filled 2022-12-06: qty 10

## 2022-12-06 MED ORDER — SCOPOLAMINE 1 MG/3DAYS TD PT72
1.0000 | MEDICATED_PATCH | Freq: Once | TRANSDERMAL | Status: DC
  Administered 2022-12-06: 1.5 mg via TRANSDERMAL
  Filled 2022-12-06: qty 1

## 2022-12-06 MED ORDER — FENTANYL CITRATE (PF) 100 MCG/2ML IJ SOLN
INTRAMUSCULAR | Status: DC | PRN
  Administered 2022-12-06 (×6): 50 ug via INTRAVENOUS

## 2022-12-06 MED ORDER — ONDANSETRON HCL 4 MG/2ML IJ SOLN
INTRAMUSCULAR | Status: DC | PRN
  Administered 2022-12-06: 4 mg via INTRAVENOUS

## 2022-12-06 SURGICAL SUPPLY — 46 items
ADH SKN CLS APL DERMABOND .7 (GAUZE/BANDAGES/DRESSINGS) ×2
APL PRP STRL LF DISP 70% ISPRP (MISCELLANEOUS) ×2
CHLORAPREP W/TINT 26 (MISCELLANEOUS) ×3 IMPLANT
COVER MAYO STAND XLG (MISCELLANEOUS) ×3 IMPLANT
COVER TIP SHEARS 8 DVNC (MISCELLANEOUS) ×3 IMPLANT
COVER TIP SHEARS 8MM DA VINCI (MISCELLANEOUS) ×2
DEFOGGER SCOPE WARMER CLEARIFY (MISCELLANEOUS) IMPLANT
DERMABOND ADVANCED .7 DNX12 (GAUZE/BANDAGES/DRESSINGS) ×3 IMPLANT
DRAPE ARM DVNC X/XI (DISPOSABLE) ×9 IMPLANT
DRAPE COLUMN DVNC XI (DISPOSABLE) ×3 IMPLANT
DRAPE DA VINCI XI ARM (DISPOSABLE) ×6
DRAPE DA VINCI XI COLUMN (DISPOSABLE) ×2
GLOVE BIOGEL PI IND STRL 7.0 (GLOVE) ×9 IMPLANT
GLOVE SURG SS PI 6.5 STRL IVOR (GLOVE) IMPLANT
GLOVE SURG SS PI 7.5 STRL IVOR (GLOVE) ×6 IMPLANT
GOWN STRL REUS W/TWL LRG LVL3 (GOWN DISPOSABLE) ×9 IMPLANT
GRASPER SUT TROCAR 14GX15 (MISCELLANEOUS) IMPLANT
IRRIGATOR SUCT 8 DISP DVNC XI (IRRIGATION / IRRIGATOR) IMPLANT
IRRIGATOR SUCTION 8MM XI DISP (IRRIGATION / IRRIGATOR)
IV NS IRRIG 3000ML ARTHROMATIC (IV SOLUTION) IMPLANT
MANIFOLD NEPTUNE II (INSTRUMENTS) ×3 IMPLANT
MESH PHASIX RESORB RECT 10X15 (Mesh General) IMPLANT
MESH VENTRALIGHT ST 4X6IN (Mesh General) IMPLANT
NDL HYPO 21X1.5 SAFETY (NEEDLE) ×3 IMPLANT
NDL INSUFFLATION 14GA 120MM (NEEDLE) ×3 IMPLANT
NEEDLE HYPO 21X1.5 SAFETY (NEEDLE) ×2 IMPLANT
NEEDLE INSUFFLATION 14GA 120MM (NEEDLE) ×2 IMPLANT
OBTURATOR OPTICAL STANDARD 8MM (TROCAR) ×2
OBTURATOR OPTICAL STND 8 DVNC (TROCAR) ×2
OBTURATOR OPTICALSTD 8 DVNC (TROCAR) ×3 IMPLANT
PACK LAP CHOLECYSTECTOMY (MISCELLANEOUS) ×3 IMPLANT
PENCIL HANDSWITCHING (ELECTRODE) ×3 IMPLANT
SEAL CANN UNIV 5-8 DVNC XI (MISCELLANEOUS) ×9 IMPLANT
SEAL XI 5MM-8MM UNIVERSAL (MISCELLANEOUS) ×6
SET TUBE SMOKE EVAC HIGH FLOW (TUBING) ×3 IMPLANT
SUT MNCRL AB 4-0 PS2 18 (SUTURE) ×6 IMPLANT
SUT STRATAFIX 0 PDS+ CT-2 23 (SUTURE) ×2
SUT V-LOC 90 ABS 3-0 VLT  V-20 (SUTURE) ×6
SUT V-LOC 90 ABS 3-0 VLT V-20 (SUTURE) ×9 IMPLANT
SUT VIC AB 2-0 CT2 27 (SUTURE) IMPLANT
SUTURE STRATFX 0 PDS+ CT-2 23 (SUTURE) ×3 IMPLANT
SYR 20ML LL LF (SYRINGE) ×3 IMPLANT
TAPE TRANSPORE STRL 2 31045 (GAUZE/BANDAGES/DRESSINGS) ×3 IMPLANT
TRAY FOLEY W/BAG SLVR 16FR (SET/KITS/TRAYS/PACK) ×2
TRAY FOLEY W/BAG SLVR 16FR ST (SET/KITS/TRAYS/PACK) ×3 IMPLANT
WATER STERILE IRR 500ML POUR (IV SOLUTION) ×3 IMPLANT

## 2022-12-06 NOTE — Anesthesia Preprocedure Evaluation (Addendum)
Anesthesia Evaluation  Patient identified by MRN, date of birth, ID band Patient awake    Reviewed: Allergy & Precautions, H&P , NPO status , Patient's Chart, lab work & pertinent test results  History of Anesthesia Complications (+) history of anesthetic complications  Airway Mallampati: II  TM Distance: >3 FB Neck ROM: Full    Dental  (+) Dental Advisory Given, Missing,    Pulmonary asthma , sleep apnea    Pulmonary exam normal breath sounds clear to auscultation       Cardiovascular negative cardio ROS Normal cardiovascular exam Rhythm:Regular Rate:Normal     Neuro/Psych Seizures - (not on meds),  PSYCHIATRIC DISORDERS  Depression    TIA Neuromuscular disease CVA, Residual Symptoms    GI/Hepatic Neg liver ROS, hiatal hernia,GERD  Medicated,,  Endo/Other  negative endocrine ROS    Renal/GU negative Renal ROS  negative genitourinary   Musculoskeletal  (+) Arthritis , Osteoarthritis,  Fibromyalgia -  Abdominal   Peds negative pediatric ROS (+)  Hematology negative hematology ROS (+)   Anesthesia Other Findings   Reproductive/Obstetrics negative OB ROS                             Anesthesia Physical Anesthesia Plan  ASA: 2  Anesthesia Plan: General   Post-op Pain Management: Minimal or no pain anticipated   Induction: Intravenous  PONV Risk Score and Plan: 4 or greater and Ondansetron, Dexamethasone, Midazolam, Scopolamine patch - Pre-op, Diphenhydramine and Metaclopromide  Airway Management Planned: Nasal Cannula and Natural Airway  Additional Equipment:   Intra-op Plan:   Post-operative Plan:   Informed Consent: I have reviewed the patients History and Physical, chart, labs and discussed the procedure including the risks, benefits and alternatives for the proposed anesthesia with the patient or authorized representative who has indicated his/her understanding and  acceptance.     Dental advisory given  Plan Discussed with: CRNA and Surgeon  Anesthesia Plan Comments: (Propofol makes her laugh and depressed)       Anesthesia Quick Evaluation

## 2022-12-06 NOTE — Anesthesia Procedure Notes (Signed)
Procedure Name: Intubation Date/Time: 12/06/2022 8:44 AM  Performed by: Hewitt Blade, CRNAPre-anesthesia Checklist: Patient identified, Emergency Drugs available, Suction available and Patient being monitored Patient Re-evaluated:Patient Re-evaluated prior to induction Oxygen Delivery Method: Circle system utilized Preoxygenation: Pre-oxygenation with 100% oxygen Induction Type: IV induction Ventilation: Mask ventilation without difficulty Laryngoscope Size: Mac and 3 Grade View: Grade I Tube type: Oral Tube size: 7.0 mm Number of attempts: 1 Airway Equipment and Method: Stylet Placement Confirmation: ETT inserted through vocal cords under direct vision, positive ETCO2 and breath sounds checked- equal and bilateral Secured at: 21 cm Tube secured with: Tape Dental Injury: Teeth and Oropharynx as per pre-operative assessment

## 2022-12-06 NOTE — Interval H&P Note (Signed)
History and Physical Interval Note:  12/06/2022 8:18 AM  Yesenia Boyd  has presented today for surgery, with the diagnosis of UMBILICAL HERNIA, GREATER THAN 3 CM.  The various methods of treatment have been discussed with the patient and family. After consideration of risks, benefits and other options for treatment, the patient has consented to  Procedure(s) with comments: XI [place] (N/A) - pt knows to arrive at 7:00 as a surgical intervention.  The patient's history has been reviewed, patient examined, no change in status, stable for surgery.  I have reviewed the patient's chart and labs.  Questions were answered to the patient's satisfaction.     Aviva Signs

## 2022-12-06 NOTE — Op Note (Signed)
Patient:  Yesenia Boyd  DOB:  10-Jan-1956  MRN:  [id number]   Preop Diagnosis: Umbilical hernia  Postop Diagnosis: Same, adhesive disease  Procedure: Robotic assisted laparoscopic umbilical herniorrhaphy with mesh, lysis of adhesions  Surgeon: Aviva Signs, MD  Anes: General endotracheal  Indications: Patient is a 67 year old black female who presents with a painful umbilical hernia.  The risks and benefits of the procedure including bleeding, infection, mesh use, cardiopulmonary difficulties, and the possibility of recurrence of the hernia were fully explained to the patient, who gave informed consent.  Procedure note: The patient was placed in the supine position.  After induction of general endotracheal anesthesia, the abdomen was prepped and draped using the usual sterile technique with ChloraPrep.  Surgical site confirmation was performed.  A Veress needle was introduced into the left upper quadrant without difficulty.  Confirmation of placement was done using the saline drop test.  The abdomen was then insufflated to 15 mmHg pressure.  An 8 mm trocar was introduced into the abdominal cavity under direct visualization without difficulty.  Additional 8 mm trocars were placed in the left flank region and left lower quadrant region.  The robot was then targeted and docked.  The patient was noted to have a 4 cm umbilical hernia with an associated diastases extending superiorly.  I did free away multiple adhesions of omentum and bowel to the anterior abdominal wall especially in the epigastric and right upper quadrant regions.  This did extend down to the right paracolic gutter.  This took approximately 25 minutes.  I had to do this in order to place the mesh.  The umbilical hernia as well as the diastases just superior to this was reapproximated using an 0 Stratafix running suture.  An 8 x 15 cm ovoid Ventralight DualMesh was then inserted and fixated to the anterior abdominal wall using a 3 oh  V-Loc running suture.  Along the superior aspect of the mesh placement, a figure-of-eight 2-0 Vicryl suture was placed in order to control a rectus wall hematoma.  No abnormal bleeding or expanding hematoma was noted at the end of the procedure.  All sutures were removed without difficulty.  All air was then evacuated from the abdominal cavity prior to undocking the robot.  All wounds were irrigated with normal saline.  All wounds were injected with Exparel.  All incisions were closed using a 4-0 Monocryl subcuticular suture.  Dermabond was applied.  All tape and needle counts were correct at the end of the procedure.  The patient was extubated in the operating room and transferred to PACU in stable condition.  Complications: None  EBL: Minimal  Specimen: None

## 2022-12-06 NOTE — Transfer of Care (Signed)
Immediate Anesthesia Transfer of Care Note  Patient: Yesenia Boyd  Procedure(s) Performed: XI ROBOT ASSISTED UMBILICAL HERNIA REPAIR W/ MESH (Abdomen) XI ROBOTIC LAPAROSCOPIC LYSIS OF ADHESIONS (Abdomen)  Patient Location: PACU  Anesthesia Type:General  Level of Consciousness: awake  Airway & Oxygen Therapy: Patient Spontanous Breathing and Patient connected to face mask oxygen  Post-op Assessment: Report given to RN and Post -op Vital signs reviewed and stable  Post vital signs: Reviewed and stable  Last Vitals:  Vitals Value Taken Time  BP 137/75 12/06/22 1137  Temp    Pulse 78 12/06/22 1138  Resp 8 12/06/22 1138  SpO2 100 % 12/06/22 1138  Vitals shown include unvalidated device data.  Last Pain:  Vitals:   12/06/22 0745  TempSrc: Oral  PainSc: 7          Complications: No notable events documented.

## 2022-12-06 NOTE — Anesthesia Postprocedure Evaluation (Signed)
Anesthesia Post Note  Patient: Yesenia Boyd  Procedure(s) Performed: XI ROBOT ASSISTED UMBILICAL HERNIA REPAIR W/ MESH (Abdomen) XI ROBOTIC LAPAROSCOPIC LYSIS OF ADHESIONS (Abdomen)  Patient location during evaluation: Phase II Anesthesia Type: General Level of consciousness: awake and alert Pain management: pain level controlled Vital Signs Assessment: post-procedure vital signs reviewed and stable Respiratory status: spontaneous breathing, nonlabored ventilation and respiratory function stable Cardiovascular status: blood pressure returned to baseline and stable Postop Assessment: no apparent nausea or vomiting Anesthetic complications: no  No notable events documented.   Last Vitals:  Vitals:   12/06/22 1330 12/06/22 1406  BP: (!) 152/86 135/75  Pulse: 78 75  Resp: 11 16  Temp:  36.7 C  SpO2: 95% 100%    Last Pain:  Vitals:   12/06/22 1406  TempSrc: Oral  PainSc: 2                  Symia Herdt C Khair Chasteen

## 2022-12-08 ENCOUNTER — Ambulatory Visit: Payer: PRIVATE HEALTH INSURANCE | Admitting: Internal Medicine

## 2022-12-12 ENCOUNTER — Encounter (HOSPITAL_COMMUNITY): Payer: Self-pay | Admitting: General Surgery

## 2022-12-15 ENCOUNTER — Encounter: Payer: Self-pay | Admitting: General Surgery

## 2022-12-15 ENCOUNTER — Ambulatory Visit: Payer: Medicare HMO | Admitting: General Surgery

## 2022-12-15 VITALS — BP 124/79 | HR 67 | Temp 97.6°F | Resp 14 | Ht 63.0 in | Wt 172.0 lb

## 2022-12-15 DIAGNOSIS — Z09 Encounter for follow-up examination after completed treatment for conditions other than malignant neoplasm: Secondary | ICD-10-CM | POA: Diagnosis not present

## 2022-12-15 NOTE — Progress Notes (Signed)
Subjective:     Yesenia Boyd  Is here for postoperative visit, status post robotic assisted laparoscopic umbilical herniorrhaphy with mesh.  Patient states that her preoperative pain has resolved.  She does have some constipation issues, though she has had those preoperatively.  She does have IBS with constipation.  She is having minimal abdominal wall stinging which comes and goes.  She is overall pleased with the results. Objective:    BP 124/79   Pulse 67   Temp 97.6 F (36.4 C) (Oral)   Resp 14   Ht 5\' 3"  (1.6 m)   Wt 172 lb (78 kg)   SpO2 96%   BMI 30.47 kg/m   General:  alert, cooperative, and no distress  Abdomen is soft, incisions healing well.  Hernia resolved.     Assessment:    Doing well postoperatively.    Plan:   Patient may increase activity as able.  I told her to take MiraLAX for her constipation.  Follow-up here as needed.

## 2022-12-15 NOTE — Patient Instructions (Signed)
Miralax for constipation

## 2022-12-22 DIAGNOSIS — J301 Allergic rhinitis due to pollen: Secondary | ICD-10-CM | POA: Diagnosis not present

## 2022-12-22 DIAGNOSIS — F451 Undifferentiated somatoform disorder: Secondary | ICD-10-CM | POA: Diagnosis not present

## 2022-12-22 DIAGNOSIS — K219 Gastro-esophageal reflux disease without esophagitis: Secondary | ICD-10-CM | POA: Diagnosis not present

## 2022-12-22 DIAGNOSIS — Z0000 Encounter for general adult medical examination without abnormal findings: Secondary | ICD-10-CM | POA: Diagnosis not present

## 2022-12-22 DIAGNOSIS — Z0001 Encounter for general adult medical examination with abnormal findings: Secondary | ICD-10-CM | POA: Diagnosis not present

## 2022-12-22 DIAGNOSIS — Z6827 Body mass index (BMI) 27.0-27.9, adult: Secondary | ICD-10-CM | POA: Diagnosis not present

## 2023-01-05 ENCOUNTER — Encounter: Payer: Self-pay | Admitting: Radiology

## 2023-01-19 ENCOUNTER — Encounter: Payer: Self-pay | Admitting: Internal Medicine

## 2023-01-19 DIAGNOSIS — F451 Undifferentiated somatoform disorder: Secondary | ICD-10-CM | POA: Diagnosis not present

## 2023-01-19 DIAGNOSIS — F408 Other phobic anxiety disorders: Secondary | ICD-10-CM | POA: Diagnosis not present

## 2023-01-19 DIAGNOSIS — K219 Gastro-esophageal reflux disease without esophagitis: Secondary | ICD-10-CM | POA: Diagnosis not present

## 2023-01-19 DIAGNOSIS — R0789 Other chest pain: Secondary | ICD-10-CM | POA: Diagnosis not present

## 2023-01-19 DIAGNOSIS — Z6827 Body mass index (BMI) 27.0-27.9, adult: Secondary | ICD-10-CM | POA: Diagnosis not present

## 2023-01-31 ENCOUNTER — Ambulatory Visit: Payer: Medicare HMO | Admitting: General Surgery

## 2023-01-31 ENCOUNTER — Other Ambulatory Visit: Payer: Self-pay

## 2023-01-31 ENCOUNTER — Encounter: Payer: Self-pay | Admitting: General Surgery

## 2023-01-31 VITALS — BP 139/83 | HR 67 | Temp 97.5°F | Resp 20 | Ht 63.0 in | Wt 162.0 lb

## 2023-01-31 DIAGNOSIS — Z09 Encounter for follow-up examination after completed treatment for conditions other than malignant neoplasm: Secondary | ICD-10-CM

## 2023-01-31 NOTE — Progress Notes (Signed)
Subjective:     Yesenia Boyd  Unexpected postoperative visit.  Patient thought she felt a stitch sticking out of one of her trocar sites in the left upper quadrant.  The site is sort of sore.  No purulent drainage has been noted. Objective:    BP 139/83   Pulse 67   Temp (!) 97.5 F (36.4 C) (Oral)   Resp 20   Ht 5\' 3"  (1.6 m)   Wt 162 lb (73.5 kg)   SpO2 98%   BMI 28.70 kg/m   General:  alert, cooperative, and no distress  Abdomen is soft, incisions are all healing well.  The left upper quadrant trocar site incision is well-healed.  No suture is seen or palpable.     Assessment:    Doing well postoperatively. I reassured the patient that there was no suture present.  She agrees. It has probably dissolved.   Plan:   Follow-up as needed.

## 2023-02-15 ENCOUNTER — Ambulatory Visit (INDEPENDENT_AMBULATORY_CARE_PROVIDER_SITE_OTHER): Payer: Medicare HMO | Admitting: Internal Medicine

## 2023-02-15 ENCOUNTER — Encounter: Payer: Self-pay | Admitting: Internal Medicine

## 2023-02-15 VITALS — BP 128/75 | HR 77 | Temp 97.7°F | Ht 63.0 in | Wt 162.5 lb

## 2023-02-15 DIAGNOSIS — K219 Gastro-esophageal reflux disease without esophagitis: Secondary | ICD-10-CM | POA: Diagnosis not present

## 2023-02-15 DIAGNOSIS — G7000 Myasthenia gravis without (acute) exacerbation: Secondary | ICD-10-CM | POA: Diagnosis not present

## 2023-02-15 DIAGNOSIS — R1319 Other dysphagia: Secondary | ICD-10-CM | POA: Diagnosis not present

## 2023-02-15 DIAGNOSIS — K648 Other hemorrhoids: Secondary | ICD-10-CM | POA: Diagnosis not present

## 2023-02-15 DIAGNOSIS — K581 Irritable bowel syndrome with constipation: Secondary | ICD-10-CM

## 2023-02-15 NOTE — Progress Notes (Signed)
Referring Provider: Toma Deiters, MD Primary Care Physician:  Toma Deiters, MD Primary GI:  Dr. Marletta Lor  Chief Complaint  Patient presents with   Gastroesophageal Reflux    Follow up on GERD. Still having some reflux but better. Dysphagia is better. Has some concerns about blood in stool. Happens a couple times a year and last for a couple of days.     HPI:   Yesenia Boyd is a 67 y.o. female who presents to clinic today for follow-up visit.  She has complicated past GI history including Niesen fundoplication x2, history of chronic dysphagia, constipation.    EGD 10/05/2020 for dysphagia showed esophageal stricture status post dilation with 18 mm balloon with moderate improvement as well as mucosal disruption.  Biopsies of the stomach negative for H. pylori.  Hyperplastic polyp removed from stomach.  EGD 11/29/2022 with evidence of prior Niesen fundoplication, dilated with 20 mm balloon.  Otherwise unremarkable.  Colonoscopy 01/26/2021 for rectal discomfort, unremarkable with recommended 10-year recall.  Today she states she is doing okay.  States her dysphagia is improved s/p recent dilation.  Chronically takes Nexium twice daily, GERD not well-controlled.  Notes breakthrough symptoms of acid reflux and burning in her stomach.  Constipation improved on [name] 24 mcg twice daily as well as liquid Dulcolax.  Previously trialed and failed Linzess.  Past Medical History:  Diagnosis Date   Arthritis    Asthma    Complication of anesthesia    Deep depression following administration of propofol   Depression    Fibromyalgia    GERD (gastroesophageal reflux disease)    History of hiatal hernia    2 surgeries, Lap. Nissen Fundiplication    IBS (irritable bowel syndrome)    Myasthenia gravis    her body mimics the disease   Peripheral neuropathy    S/P colonoscopy    Dr. Linna Darner, internal hemorrhoids, repeat in Sept 2022   S/P endoscopy    Dr. Linna Darner 2008: removal of  impacted food bolus, Dr. Linna Darner 2010: moderate gastritis, Sept 2012 with SLF: path with mild gastritis, no definite stricture noted, dilation with Savary 16 mm   Sciatica    Seizures    Sleep apnea    Stroke    TIA (transient ischemic attack) 2012    Past Surgical History:  Procedure Laterality Date   ABDOMINAL HYSTERECTOMY     complete per patient   BALLOON DILATION N/A 10/05/2020   Procedure: BALLOON DILATION;  Surgeon: Lanelle Bal, DO;  Location: [facility];  Service: Endoscopy;  Laterality: N/A;   BALLOON DILATION N/A 11/29/2022   Procedure: BALLOON DILATION;  Surgeon: Lanelle Bal, DO;  Location: [facility];  Service: Endoscopy;  Laterality: N/A;   BLADDER SURGERY     stretch bladder opening   CHOLECYSTECTOMY     COLONOSCOPY  07/18/2011   SLF: 1. internal hemorrhoids   COLONOSCOPY WITH PROPOFOL N/A 03/21/2017   Dr. Darrick Penna: Redundant left colon, hemorrhoids, next colonoscopy in 10 years.  Patient reports deep pression after propofol.   COLONOSCOPY WITH PROPOFOL N/A 01/26/2021   Procedure: COLONOSCOPY WITH PROPOFOL;  Surgeon: Lanelle Bal, DO;  Location: [facility];  Service: Endoscopy;  Laterality: N/A;  am appt   ESOPHAGEAL MANOMETRY N/A 01/21/2013   esophageal surgery?     2008 [facility]   ESOPHAGOGASTRODUODENOSCOPY  07/18/2011   SLF: 1. Moderate gastritis 2. Dysphagia most likely 2o large food bolus moving through her Nissen, which appears to be  intact. Pt has poor denttition and NL BPE 2 years ago. 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   ESOPHAGOGASTRODUODENOSCOPY (EGD) WITH PROPOFOL N/A 08/21/2018   dr. Darrick Penna: mild gastritis, esophagus appeared normal. s/p dilation   ESOPHAGOGASTRODUODENOSCOPY (EGD) WITH PROPOFOL N/A 10/05/2020   Nissen, benign-appearing stenosis s/p dilation, gastric polyps biospied and normal duodenum. Hyperplastic polyp and reactive gastropathy noted.    ESOPHAGOGASTRODUODENOSCOPY (EGD) WITH PROPOFOL  N/A 11/29/2022   Procedure: ESOPHAGOGASTRODUODENOSCOPY (EGD) WITH PROPOFOL;  Surgeon: Lanelle Bal, DO;  [facility]: [facility];  Service: Endoscopy;  Laterality: N/A;  930am, asa 2   EVALUATION UNDER ANESTHESIA WITH HEMORRHOIDECTOMY N/A 03/27/2015   Procedure: EXAM UNDER ANESTHESIA WITH SPHINCTEROTOMY ;  Surgeon: Luretha Murphy, MD;  Location: [facility];  Service: General;  Laterality: N/A;   FLEXIBLE SIGMOIDOSCOPY N/A 12/09/2014   SLF: Rectal pain most likely due to internal and external hemorroids   HAND SURGERY     carpal tumnnel right and removal of cyst left   HEMORRHOID BANDING N/A 12/09/2014   Procedure: HEMORRHOID BANDING;  Surgeon: West Bali, MD;  Location: [facility];  Service: Endoscopy;  Laterality: N/A;   LAPAROSCOPIC LYSIS OF ADHESIONS  12/06/2022   Procedure: XI ROBOTIC LAPAROSCOPIC LYSIS OF ADHESIONS;  Surgeon: Franky Macho, MD;  Location: [facility];  Service: General;;   LAPAROSCOPIC NISSEN FUNDOPLICATION N/A 02/13/2013   Procedure: Redo LAPAROSCOPIC NISSEN FUNDOPLICATION;  Surgeon: Valarie Merino, MD;  Location: [facility];  Service: General;  Laterality: N/A;  Forgut explortation with partial  takedown nissan funliplication from 1994 for wrap torsion and persistant dysphagia   NASAL SEPTOPLASTY W/ TURBINOPLASTY Bilateral 08/03/2016   Procedure: NASAL SEPTOPLASTY WITH TURBINATE REDUCTION;  Surgeon: Newman Pies, MD;  Location: [facility];  Service: ENT;  Laterality: Bilateral;   NECK SURGERY     removal of "knot" from neck   NISSEN FUNDOPLICATION     initially in 1994. redo in 2014   POLYPECTOMY  10/05/2020   Procedure: POLYPECTOMY;  Surgeon: Lanelle Bal, DO;  [facility]: [facility];  Service: Endoscopy;;   SAVORY DILATION  07/18/2011   Surgeon:Sandi M Fields   SAVORY DILATION  08/21/2018   Procedure: SAVORY DILATION;  Surgeon: West Bali, MD;  Location: [facility];  Service: Endoscopy;;    Current Outpatient Medications  Medication Sig Dispense Refill   acetaminophen  (TYLENOL) 650 MG CR tablet Take 1,300 mg by mouth every 8 (eight) hours as needed for pain.     Ascorbic Acid (VITAMIN C GUMMIE PO) Take 2-3 tablets by mouth every evening.     Biotin w/ Vitamins C & E (HAIR SKIN & NAILS GUMMIES PO) Take 2 tablets by mouth daily.     CARAFATE 1 GM/10ML suspension Take 1 g by mouth 3 (three) times daily.     Cholecalciferol (VITAMIN D-3) 125 MCG (5000 UT) TABS Take 5,000 Units by mouth at bedtime.     Coenzyme Q10 (CO Q-10) 100 MG CHEW Chew 600 mg by mouth daily.     diclofenac Sodium (VOLTAREN) 1 % GEL Apply 1 application  topically 2 (two) times daily.     EPINEPHrine 0.3 mg/0.3 mL IJ SOAJ injection Inject 0.3 mg into the muscle as needed for anaphylaxis.     esomeprazole (NEXIUM) 40 MG capsule Take 1 capsule (40 mg total) by mouth 2 (two) times daily before a meal. 180 capsule 0   estrogens, conjugated, (PREMARIN) 0.625 MG tablet Take 0.625 mg by mouth  at bedtime.      Glucosamine-Chondroitin (MOVE FREE PO) Take 1 tablet by mouth every evening.      hydrOXYzine (ATARAX/VISTARIL) 25 MG tablet Take 25 mg by mouth at bedtime.      L-Methylfolate-B6-B12 (FOLTANX) 3-35-2 MG TABS Take 1 tablet by mouth daily.     loratadine (CLARITIN) 10 MG tablet Take 10 mg by mouth every evening.     lubiprostone ([name]) 24 MCG capsule Take 1 capsule (24 mcg total) by mouth 2 (two) times daily with a meal. 60 capsule 11   magnesium hydroxide (DULCOLAX) 400 MG/5ML suspension Take 15 mLs by mouth daily as needed for mild constipation.     montelukast (SINGULAIR) 10 MG tablet Take 10 mg by mouth at bedtime.     Multiple Vitamin (MULTIVITAMIN WITH MINERALS) TABS tablet Take 2 tablets by mouth every evening. Women's Multivitamin     NASACORT ALLERGY 24HR 55 MCG/ACT AERO nasal inhaler Place 1-2 sprays into the nose daily as needed (allergies).     NITRO-BID 2 % ointment Apply 1 inch topically daily as needed (rectal bleeding).     ondansetron (ZOFRAN) 4 MG tablet Take 1 tablet (4 mg  total) by mouth every 8 (eight) hours as needed for nausea or vomiting. 60 tablet 3   Polyethylene Glycol 400 (VISINE DRY EYE RELIEF OP) Place 1 drop into both eyes daily as needed (dry/ red eyes).     rizatriptan (MAXALT) 10 MG tablet Take 10 mg by mouth 2 (two) times daily as needed for migraine.      simethicone (MYLICON) 125 MG chewable tablet Chew 250 mg by mouth every 6 (six) hours as needed for flatulence.     Tiotropium Bromide Monohydrate (SPIRIVA RESPIMAT) 2.5 MCG/ACT AERS Inhale 1 each into the lungs daily as needed (shortness of breath).     tiZANidine (ZANAFLEX) 4 MG tablet Take 4 mg by mouth at bedtime.     traZODone (DESYREL) 50 MG tablet Take 25 mg by mouth at bedtime as needed for sleep.     Turmeric (QC TUMERIC COMPLEX PO) Take 4 tablets by mouth at bedtime.     vitamin E 1000 UNIT capsule Take 1,000 Units by mouth at bedtime.      No current facility-administered medications for this visit.    Allergies as of 02/15/2023 - Review Complete 02/15/2023  Allergen Reaction Noted   Aspirin Rash 07/28/2016   Avelox [moxifloxacin hcl in nacl] Anaphylaxis and Itching 06/20/2011   Linzess [linaclotide] Swelling 02/04/2013   Methocarbamol  11/29/2022   Penicillins Anaphylaxis and Hives    Adhesive [tape] Itching 01/24/2013   Carbamazepine Other (See Comments) 03/16/2015   Cephalexin Hives and Itching    Clindamycin/lincomycin Hives and Itching 12/17/2018   Codeine Hives and Itching    Cymbalta [duloxetine hcl] Other (See Comments) 06/20/2011   Darvon [propoxyphene] Hives 03/17/2015   Dicyclomine Itching and Other (See Comments) 06/20/2011   Dicyclomine hcl Other (See Comments) 11/12/2018   Famotidine Nausea And Vomiting 03/16/2015   Hydrocodone-acetaminophen Itching 03/15/2017   Ketek [telithromycin] Hives and Itching 09/06/2017   Latex Itching 01/24/2013   Macrolides and ketolides Swelling 07/18/2011   Meperidine hcl Itching and Other (See Comments)    Metronidazole Hives  and Other (See Comments) 06/20/2011   Morphine Itching and Other (See Comments)    Mupirocin Itching 12/17/2018   Prednisone Hives, Swelling, and Other (See Comments) 06/20/2011   Propofol Other (See Comments) 03/17/2015   Protonix [pantoprazole] Swelling 03/17/2015   Solodyn [minocycline hcl  er] Itching and Swelling 11/21/2019   Sulfamethoxazole-trimethoprim Hives and Swelling 11/12/2018   Sulfonamide derivatives Hives    Tramadol hcl Hives and Other (See Comments)    Zylet [loteprednol-tobramycin] Hives and Swelling 03/15/2017    Family History  Problem Relation Age of Onset   Anesthesia problems Sister    Cancer Sister        breast   Hypertension Sister    Parkinson's disease Mother    Kidney disease Mother    Diabetes Mother    Heart attack Father    Cancer Father        stomach cancer   Hypertension Brother    Diabetes Brother    Arthritis Brother    Kidney disease Brother    Parkinson's disease Brother    Cancer Maternal Aunt        breast   Cancer Maternal Grandmother        breast   Multiple sclerosis Daughter    Colon cancer Cousin        4 cousins   Pseudochol deficiency Neg Hx    Hypotension Neg Hx    Malignant hyperthermia Neg Hx     Social History   Socioeconomic History   Marital status: Married    Spouse name: Not on file   Number of children: Not on file   Years of education: Not on file   Highest education level: Not on file  Occupational History   Not on file  Tobacco Use   Smoking status: Never    Passive exposure: Never   Smokeless tobacco: Never  Vaping Use   Vaping Use: Never used  Substance and Sexual Activity   Alcohol use: Not Currently    Comment: drinks wine "every blue moon"   Drug use: No   Sexual activity: Not Currently  Other Topics Concern   Not on file  Social History Narrative   Not on file   Social Determinants of Health   Financial Resource Strain: Not on file  Food Insecurity: Not on file  Transportation  Needs: Not on file  Physical Activity: Not on file  Stress: Not on file  Social Connections: Not on file    Subjective: Review of Systems  Constitutional:  Negative for chills and fever.  HENT:  Negative for congestion and hearing loss.   Eyes:  Negative for blurred vision and double vision.  Respiratory:  Negative for cough and shortness of breath.   Cardiovascular:  Negative for chest pain and palpitations.  Gastrointestinal:  Positive for constipation and heartburn. Negative for abdominal pain, blood in stool, diarrhea, melena and vomiting.  Genitourinary:  Negative for dysuria and urgency.  Musculoskeletal:  Negative for joint pain and myalgias.  Skin:  Negative for itching and rash.  Neurological:  Negative for dizziness and headaches.  Psychiatric/Behavioral:  Negative for depression. The patient is not nervous/anxious.      Objective: BP 128/75 (BP Location: Left Arm, Patient Position: Sitting, Cuff Size: Large)   Pulse 77   Temp 97.7 F (36.5 C) (Oral)   Ht 5\' 3"  (1.6 m)   Wt 162 lb 8 oz (73.7 kg)   BMI 28.79 kg/m  Physical Exam Constitutional:      Appearance: Normal appearance.  HENT:     Head: Normocephalic and atraumatic.  Eyes:     Extraocular Movements: Extraocular movements intact.     Conjunctiva/sclera: Conjunctivae normal.  Cardiovascular:     Rate and Rhythm: Normal rate and regular rhythm.  Pulmonary:  Effort: Pulmonary effort is normal.     Breath sounds: Normal breath sounds.  Abdominal:     General: Bowel sounds are normal.     Palpations: Abdomen is soft.  Musculoskeletal:        General: No swelling. Normal range of motion.     Cervical back: Normal range of motion and neck supple.  Skin:    General: Skin is warm and dry.     Coloration: Skin is not jaundiced.  Neurological:     General: No focal deficit present.     Mental Status: She is alert and oriented to person, place, and time.  Psychiatric:        Mood and Affect: Mood  normal.        Behavior: Behavior normal.      Assessment: *Irritable bowel syndrome with constipation *Chronic GERD *Dysphagia-improved status post recent dilation  Plan: Patient's GERD not well-controlled on Nexium twice daily.  Has tried and failed multiple other PPIs.  Will give samples of Voquezna today and she how she does.  Call with update next week and I will send in formal prescription.  May need to use [facility]  Continue OTC hemorrhoid cream.  I offered Anusol cream the patient states she is very averse to steroids and does not wish to try this.  For constipation, continue on [name] as well as liquid Dulcolax.  Follow-up in 3 months.   02/15/2023 2:29 PM   Disclaimer: This note was dictated with voice recognition software. Similar sounding words can inadvertently be transcribed and may not be corrected upon review.

## 2023-02-15 NOTE — Patient Instructions (Signed)
I will give you samples of a medication called Voquezna to take for your chronic acid reflux.  Do not take Nexium with this medication.  Let us know in a week how you are doing and if improved I will send in formal prescription.  Continue ointment for your hemorrhoids.  Continue on [name] twice daily as well as liquid Dulcolax.  Follow-up in 3 months.  It was very nice seeing you again today.  Dr. Marletta Lor

## 2023-02-21 DIAGNOSIS — R071 Chest pain on breathing: Secondary | ICD-10-CM | POA: Diagnosis not present

## 2023-02-21 DIAGNOSIS — F408 Other phobic anxiety disorders: Secondary | ICD-10-CM | POA: Diagnosis not present

## 2023-02-21 DIAGNOSIS — K219 Gastro-esophageal reflux disease without esophagitis: Secondary | ICD-10-CM | POA: Diagnosis not present

## 2023-02-21 DIAGNOSIS — Z6826 Body mass index (BMI) 26.0-26.9, adult: Secondary | ICD-10-CM | POA: Diagnosis not present

## 2023-02-21 DIAGNOSIS — F451 Undifferentiated somatoform disorder: Secondary | ICD-10-CM | POA: Diagnosis not present

## 2023-03-14 DIAGNOSIS — Z01419 Encounter for gynecological examination (general) (routine) without abnormal findings: Secondary | ICD-10-CM | POA: Diagnosis not present

## 2023-03-23 DIAGNOSIS — F408 Other phobic anxiety disorders: Secondary | ICD-10-CM | POA: Diagnosis not present

## 2023-03-23 DIAGNOSIS — Z6826 Body mass index (BMI) 26.0-26.9, adult: Secondary | ICD-10-CM | POA: Diagnosis not present

## 2023-03-23 DIAGNOSIS — K219 Gastro-esophageal reflux disease without esophagitis: Secondary | ICD-10-CM | POA: Diagnosis not present

## 2023-03-23 DIAGNOSIS — F451 Undifferentiated somatoform disorder: Secondary | ICD-10-CM | POA: Diagnosis not present

## 2023-04-17 ENCOUNTER — Other Ambulatory Visit: Payer: Self-pay | Admitting: Internal Medicine

## 2023-04-20 DIAGNOSIS — F408 Other phobic anxiety disorders: Secondary | ICD-10-CM | POA: Diagnosis not present

## 2023-04-20 DIAGNOSIS — F451 Undifferentiated somatoform disorder: Secondary | ICD-10-CM | POA: Diagnosis not present

## 2023-04-20 DIAGNOSIS — Z6826 Body mass index (BMI) 26.0-26.9, adult: Secondary | ICD-10-CM | POA: Diagnosis not present

## 2023-04-20 DIAGNOSIS — K219 Gastro-esophageal reflux disease without esophagitis: Secondary | ICD-10-CM | POA: Diagnosis not present

## 2023-05-03 DIAGNOSIS — R92333 Mammographic heterogeneous density, bilateral breasts: Secondary | ICD-10-CM | POA: Diagnosis not present

## 2023-05-03 DIAGNOSIS — N6489 Other specified disorders of breast: Secondary | ICD-10-CM | POA: Diagnosis not present

## 2023-05-03 DIAGNOSIS — R928 Other abnormal and inconclusive findings on diagnostic imaging of breast: Secondary | ICD-10-CM | POA: Diagnosis not present

## 2023-05-08 DIAGNOSIS — M25521 Pain in right elbow: Secondary | ICD-10-CM | POA: Diagnosis not present

## 2023-05-08 DIAGNOSIS — M791 Myalgia, unspecified site: Secondary | ICD-10-CM | POA: Diagnosis not present

## 2023-05-08 DIAGNOSIS — M542 Cervicalgia: Secondary | ICD-10-CM | POA: Diagnosis not present

## 2023-05-08 DIAGNOSIS — M25512 Pain in left shoulder: Secondary | ICD-10-CM | POA: Diagnosis not present

## 2023-05-15 DIAGNOSIS — M791 Myalgia, unspecified site: Secondary | ICD-10-CM | POA: Diagnosis not present

## 2023-05-25 DIAGNOSIS — Z6826 Body mass index (BMI) 26.0-26.9, adult: Secondary | ICD-10-CM | POA: Diagnosis not present

## 2023-05-25 DIAGNOSIS — M25521 Pain in right elbow: Secondary | ICD-10-CM | POA: Diagnosis not present

## 2023-05-25 DIAGNOSIS — K219 Gastro-esophageal reflux disease without esophagitis: Secondary | ICD-10-CM | POA: Diagnosis not present

## 2023-05-25 DIAGNOSIS — F451 Undifferentiated somatoform disorder: Secondary | ICD-10-CM | POA: Diagnosis not present

## 2023-05-25 DIAGNOSIS — F408 Other phobic anxiety disorders: Secondary | ICD-10-CM | POA: Diagnosis not present

## 2023-05-30 DIAGNOSIS — M25521 Pain in right elbow: Secondary | ICD-10-CM | POA: Diagnosis not present

## 2023-05-31 ENCOUNTER — Ambulatory Visit (INDEPENDENT_AMBULATORY_CARE_PROVIDER_SITE_OTHER): Payer: Medicare HMO | Admitting: Internal Medicine

## 2023-05-31 ENCOUNTER — Encounter: Payer: Self-pay | Admitting: Internal Medicine

## 2023-05-31 VITALS — BP 122/77 | HR 80 | Temp 98.0°F | Ht 63.0 in | Wt 160.2 lb

## 2023-05-31 DIAGNOSIS — R1319 Other dysphagia: Secondary | ICD-10-CM

## 2023-05-31 DIAGNOSIS — K219 Gastro-esophageal reflux disease without esophagitis: Secondary | ICD-10-CM

## 2023-05-31 DIAGNOSIS — K648 Other hemorrhoids: Secondary | ICD-10-CM | POA: Diagnosis not present

## 2023-05-31 DIAGNOSIS — K581 Irritable bowel syndrome with constipation: Secondary | ICD-10-CM

## 2023-05-31 MED ORDER — SUCRALFATE 1 GM/10ML PO SUSP: 1.0000 g | Freq: Three times a day (TID) | ORAL | 5 refills | Status: DC

## 2023-05-31 MED ORDER — ESOMEPRAZOLE MAGNESIUM 40 MG PO CPDR: 40.0000 mg | DELAYED_RELEASE_CAPSULE | Freq: Two times a day (BID) | ORAL | 3 refills | Status: DC

## 2023-05-31 NOTE — Patient Instructions (Signed)
Continue on Nexium and Carafate.  I refilled both of these today.  Continue on [name].  Would recommend you experiment with the liquid Dulcolax dosing to see if you can decrease the time you spent on the toilet.  Follow-up in 3 months.  It is always a pleasure seeing you.  Dr. Marletta Lor

## 2023-05-31 NOTE — Progress Notes (Signed)
Referring Provider: Toma Deiters, MD Primary Care Physician:  Toma Deiters, MD Primary GI:  Dr. Marletta Lor  Chief Complaint  Patient presents with   Abdominal Pain    Having some swelling and pain in upper abdomen for the past 2 -3 days. Tender to the touch.    Gastroesophageal Reflux    Follow up on GERD. States still having issues with stomach feeling like its burning. Takes nexium bid.    Dysphagia    Getting strangled on water at times. Eating soft foods and liquids.    Irritable Bowel Syndrome    Follow up on IBS - C. Has BM daily.     HPI:   Yesenia Boyd is a 67 y.o. female who presents to clinic today for follow-up visit.  She has complicated past GI history including Niesen fundoplication x2, history of chronic dysphagia, constipation.    EGD 10/05/2020 for dysphagia showed esophageal stricture status post dilation with 18 mm balloon with moderate improvement as well as mucosal disruption.  Biopsies of the stomach negative for H. pylori.  Hyperplastic polyp removed from stomach.  EGD 11/29/2022 with evidence of prior Niesen fundoplication, dilated with 20 mm balloon.  Otherwise unremarkable.  Colonoscopy 01/26/2021 for rectal discomfort, unremarkable with recommended 10-year recall.  Today she states she is doing okay.  States her dysphagia is improved s/p recent dilation.  Chronically takes Nexium twice daily, GERD.  Notes breakthrough symptoms of acid reflux and burning in her stomach. Has tried multiple PPIs prior. I gave her samples of Voquezna on previous visit but states she did no take these because she read about possible side effects.   Constipation improved on [name] 24 mcg daily as well as liquid Dulcolax.  Previously trialed and failed Linzess. Does note spending approx 1 hour on toilet each day  Past Medical History:  Diagnosis Date   Arthritis    Asthma    Complication of anesthesia    Deep depression following administration of propofol    Depression    Fibromyalgia    GERD (gastroesophageal reflux disease)    History of hiatal hernia    2 surgeries, Lap. Nissen Fundiplication    IBS (irritable bowel syndrome)    Myasthenia gravis    her body mimics the disease   Peripheral neuropathy    S/P colonoscopy    Dr. Linna Darner, internal hemorrhoids, repeat in Sept 2022   S/P endoscopy    Dr. Linna Darner 2008: removal of impacted food bolus, Dr. Linna Darner 2010: moderate gastritis, Sept 2012 with SLF: path with mild gastritis, no definite stricture noted, dilation with Savary 16 mm   Sciatica    Seizures (HCC)    Sleep apnea    Stroke (HCC)    TIA (transient ischemic attack) 2012    Past Surgical History:  Procedure Laterality Date   ABDOMINAL HYSTERECTOMY     complete per patient   BALLOON DILATION N/A 10/05/2020   Procedure: BALLOON DILATION;  Surgeon: Lanelle Bal, DO;  Location: [facility];  Service: Endoscopy;  Laterality: N/A;   BALLOON DILATION N/A 11/29/2022   Procedure: BALLOON DILATION;  Surgeon: Lanelle Bal, DO;  Location: [facility];  Service: Endoscopy;  Laterality: N/A;   BLADDER SURGERY     stretch bladder opening   CHOLECYSTECTOMY     COLONOSCOPY  07/18/2011   SLF: 1. internal hemorrhoids   COLONOSCOPY WITH PROPOFOL N/A 03/21/2017   Dr. Darrick Penna: Redundant left colon, hemorrhoids, next colonoscopy in 10  years.  Patient reports deep pression after propofol.   COLONOSCOPY WITH PROPOFOL N/A 01/26/2021   Procedure: COLONOSCOPY WITH PROPOFOL;  Surgeon: Lanelle Bal, DO;  [facility]: [facility];  Service: Endoscopy;  Laterality: N/A;  am appt   ESOPHAGEAL MANOMETRY N/A 01/21/2013   esophageal surgery?     2008 [facility]   ESOPHAGOGASTRODUODENOSCOPY  07/18/2011   SLF: 1. Moderate gastritis 2. Dysphagia most likely 2o large food bolus moving through her Nissen, which appears to be intact. Pt has poor denttition and NL BPE 2 years ago. 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    ESOPHAGOGASTRODUODENOSCOPY (EGD) WITH PROPOFOL N/A 08/21/2018   dr. Darrick Penna: mild gastritis, esophagus appeared normal. s/p dilation   ESOPHAGOGASTRODUODENOSCOPY (EGD) WITH PROPOFOL N/A 10/05/2020   Nissen, benign-appearing stenosis s/p dilation, gastric polyps biospied and normal duodenum. Hyperplastic polyp and reactive gastropathy noted.    ESOPHAGOGASTRODUODENOSCOPY (EGD) WITH PROPOFOL N/A 11/29/2022   Procedure: ESOPHAGOGASTRODUODENOSCOPY (EGD) WITH PROPOFOL;  Surgeon: Lanelle Bal, DO;  [facility]: [facility];  Service: Endoscopy;  Laterality: N/A;  930am, asa 2   EVALUATION UNDER ANESTHESIA WITH HEMORRHOIDECTOMY N/A 03/27/2015   Procedure: EXAM UNDER ANESTHESIA WITH SPHINCTEROTOMY ;  Surgeon: Luretha Murphy, MD;  Location: [facility];  Service: General;  Laterality: N/A;   FLEXIBLE SIGMOIDOSCOPY N/A 12/09/2014   SLF: Rectal pain most likely due to internal and external hemorroids   HAND SURGERY     carpal tumnnel right and removal of cyst left   HEMORRHOID BANDING N/A 12/09/2014   Procedure: HEMORRHOID BANDING;  Surgeon: West Bali, MD;  Location: [facility];  Service: Endoscopy;  Laterality: N/A;   LAPAROSCOPIC LYSIS OF ADHESIONS  12/06/2022   Procedure: XI ROBOTIC LAPAROSCOPIC LYSIS OF ADHESIONS;  Surgeon: Franky Macho, MD;  Location: [facility];  Service: General;;   LAPAROSCOPIC NISSEN FUNDOPLICATION N/A 02/13/2013   Procedure: Redo LAPAROSCOPIC NISSEN FUNDOPLICATION;  Surgeon: Valarie Merino, MD;  Location: [facility];  Service: General;  Laterality: N/A;  Forgut explortation with partial  takedown nissan funliplication from 1994 for wrap torsion and persistant dysphagia   NASAL SEPTOPLASTY W/ TURBINOPLASTY Bilateral 08/03/2016   Procedure: NASAL SEPTOPLASTY WITH TURBINATE REDUCTION;  Surgeon: Newman Pies, MD;  Location: [facility];  Service: ENT;  Laterality: Bilateral;   NECK SURGERY     removal of "knot" from neck   NISSEN FUNDOPLICATION     initially in 1994. redo in 2014   POLYPECTOMY  10/05/2020    Procedure: POLYPECTOMY;  Surgeon: Lanelle Bal, DO;  [facility]: [facility];  Service: Endoscopy;;   SAVORY DILATION  07/18/2011   Surgeon:Sandi M Fields   SAVORY DILATION  08/21/2018   Procedure: SAVORY DILATION;  Surgeon: West Bali, MD;  Location: [facility];  Service: Endoscopy;;    Current Outpatient Medications  Medication Sig Dispense Refill   acetaminophen (TYLENOL) 650 MG CR tablet Take 1,300 mg by mouth every 8 (eight) hours as needed for pain.     albuterol (VENTOLIN HFA) 108 (90 Base) MCG/ACT inhaler Inhale into the lungs every 6 (six) hours as needed for wheezing or shortness of breath.     Ascorbic Acid (VITAMIN C GUMMIE PO) Take 2-3 tablets by mouth every evening.     Biotin w/ Vitamins C & E (HAIR SKIN & NAILS GUMMIES PO) Take 2 tablets by mouth daily.     CARAFATE 1 GM/10ML suspension Take 1 g by mouth 3 (three) times daily.  Cholecalciferol (VITAMIN D-3 PO) Take 10,000 Units by mouth at bedtime.     Coenzyme Q10 (CO Q-10) 100 MG CHEW Chew 600 mg by mouth daily.     diclofenac Sodium (VOLTAREN) 1 % GEL Apply 1 application  topically 2 (two) times daily.     EPINEPHrine 0.3 mg/0.3 mL IJ SOAJ injection Inject 0.3 mg into the muscle as needed for anaphylaxis.     esomeprazole (NEXIUM) 40 MG capsule TAKE 1 CAPSULE BY MOUTH TWICE DAILY BEFORE A MEAL 180 capsule 0   estrogens, conjugated, (PREMARIN) 0.625 MG tablet Take 0.625 mg by mouth at bedtime.      Glucosamine-Chondroitin (MOVE FREE PO) Take 1 tablet by mouth every evening.      hydrOXYzine (ATARAX/VISTARIL) 25 MG tablet Take 25 mg by mouth at bedtime.      L-Methylfolate-B6-B12 (FOLTANX) 3-35-2 MG TABS Take 1 tablet by mouth daily.     loratadine (CLARITIN) 10 MG tablet Take 10 mg by mouth every evening.     lubiprostone ([name]) 24 MCG capsule Take 1 capsule (24 mcg total) by mouth 2 (two) times daily with a meal. 60 capsule 11   magnesium hydroxide (DULCOLAX) 400 MG/5ML suspension Take 15 mLs by mouth  daily as needed for mild constipation.     montelukast (SINGULAIR) 10 MG tablet Take 10 mg by mouth at bedtime.     Multiple Vitamin (MULTIVITAMIN WITH MINERALS) TABS tablet Take 2 tablets by mouth every evening. Women's Multivitamin     NASACORT ALLERGY 24HR 55 MCG/ACT AERO nasal inhaler Place 1-2 sprays into the nose daily as needed (allergies).     NITRO-BID 2 % ointment Apply 1 inch topically daily as needed (rectal bleeding).     ondansetron (ZOFRAN) 4 MG tablet Take 1 tablet (4 mg total) by mouth every 8 (eight) hours as needed for nausea or vomiting. 60 tablet 3   Polyethylene Glycol 400 (VISINE DRY EYE RELIEF OP) Place 1 drop into both eyes daily as needed (dry/ red eyes).     rizatriptan (MAXALT) 10 MG tablet Take 10 mg by mouth 2 (two) times daily as needed for migraine.      simethicone (MYLICON) 125 MG chewable tablet Chew 250 mg by mouth every 6 (six) hours as needed for flatulence.     Tiotropium Bromide Monohydrate (SPIRIVA RESPIMAT) 2.5 MCG/ACT AERS Inhale 1 each into the lungs daily as needed (shortness of breath).     tiZANidine (ZANAFLEX) 4 MG tablet Take 4 mg by mouth at bedtime.     traZODone (DESYREL) 50 MG tablet Take 25 mg by mouth at bedtime as needed for sleep.     Turmeric (QC TUMERIC COMPLEX PO) Take 4 tablets by mouth at bedtime.     vitamin E 1000 UNIT capsule Take 1,000 Units by mouth at bedtime.      No current facility-administered medications for this visit.    Allergies as of 05/31/2023 - Review Complete 05/31/2023  Allergen Reaction Noted   Aspirin Rash 07/28/2016   Avelox [moxifloxacin hcl in nacl] Anaphylaxis and Itching 06/20/2011   Linzess [linaclotide] Swelling 02/04/2013   Methocarbamol  11/29/2022   Penicillins Anaphylaxis and Hives    Adhesive [tape] Itching 01/24/2013   Carbamazepine Other (See Comments) 03/16/2015   Cephalexin Hives and Itching    Clindamycin/lincomycin Hives and Itching 12/17/2018   Codeine Hives and Itching    Cymbalta  [duloxetine hcl] Other (See Comments) 06/20/2011   Darvon [propoxyphene] Hives 03/17/2015   Dicyclomine Itching and Other (See  Comments) 06/20/2011   Dicyclomine hcl Other (See Comments) 11/12/2018   Famotidine Nausea And Vomiting 03/16/2015   Hydrocodone-acetaminophen Itching 03/15/2017   Ketek [telithromycin] Hives and Itching 09/06/2017   Latex Itching 01/24/2013   Macrolides and ketolides Swelling 07/18/2011   Meperidine hcl Itching and Other (See Comments)    Metronidazole Hives and Other (See Comments) 06/20/2011   Morphine Itching and Other (See Comments)    Mupirocin Itching 12/17/2018   Prednisone Hives, Swelling, and Other (See Comments) 06/20/2011   Propofol Other (See Comments) 03/17/2015   Protonix [pantoprazole] Swelling 03/17/2015   Solodyn [minocycline hcl er] Itching and Swelling 11/21/2019   Sulfamethoxazole-trimethoprim Hives and Swelling 11/12/2018   Sulfonamide derivatives Hives    Tramadol hcl Hives and Other (See Comments)    Zylet [loteprednol-tobramycin] Hives and Swelling 03/15/2017    Family History  Problem Relation Age of Onset   Anesthesia problems Sister    Cancer Sister        breast   Hypertension Sister    Parkinson's disease Mother    Kidney disease Mother    Diabetes Mother    Heart attack Father    Cancer Father        stomach cancer   Hypertension Brother    Diabetes Brother    Arthritis Brother    Kidney disease Brother    Parkinson's disease Brother    Cancer Maternal Aunt        breast   Cancer Maternal Grandmother        breast   Multiple sclerosis Daughter    Colon cancer Cousin        4 cousins   Pseudochol deficiency Neg Hx    Hypotension Neg Hx    Malignant hyperthermia Neg Hx     Social History   Socioeconomic History   Marital status: Married    Spouse name: Not on file   Number of children: Not on file   Years of education: Not on file   Highest education level: Not on file  Occupational History   Not on  file  Tobacco Use   Smoking status: Never    Passive exposure: Never   Smokeless tobacco: Never  Vaping Use   Vaping status: Never Used  Substance and Sexual Activity   Alcohol use: Not Currently    Comment: drinks wine "every blue moon"   Drug use: No   Sexual activity: Not Currently  Other Topics Concern   Not on file  Social History Narrative   Not on file   Social Determinants of Health   Financial Resource Strain: Not on file  Food Insecurity: Not on file  Transportation Needs: Not on file  Physical Activity: Inactive (03/14/2023)   Received from [facility] [place]   Exercise Vital Sign    Days of Exercise per Week: 0 days    Minutes of Exercise per Session: 0 min  Stress: Not on file  Social Connections: Not on file    Subjective: Review of Systems  Constitutional:  Negative for chills and fever.  HENT:  Negative for congestion and hearing loss.   Eyes:  Negative for blurred vision and double vision.  Respiratory:  Negative for cough and shortness of breath.   Cardiovascular:  Negative for chest pain and palpitations.  Gastrointestinal:  Positive for constipation and heartburn. Negative for abdominal pain, blood in stool, diarrhea, melena and vomiting.  Genitourinary:  Negative for dysuria and urgency.  Musculoskeletal:  Negative for joint pain  and myalgias.  Skin:  Negative for itching and rash.  Neurological:  Negative for dizziness and headaches.  Psychiatric/Behavioral:  Negative for depression. The patient is not nervous/anxious.      Objective: BP 122/77 (BP Location: Left Arm, Patient Position: Sitting, Cuff Size: Normal)   Pulse 80   Temp 98 F (36.7 C) (Oral)   Ht 5\' 3"  (1.6 m)   Wt 160 lb 3.2 oz (72.7 kg)   BMI 28.38 kg/m  Physical Exam Constitutional:      Appearance: Normal appearance.  HENT:     Head: Normocephalic and atraumatic.  Eyes:     Extraocular Movements: Extraocular movements intact.     Conjunctiva/sclera:  Conjunctivae normal.  Cardiovascular:     Rate and Rhythm: Normal rate and regular rhythm.  Pulmonary:     Effort: Pulmonary effort is normal.     Breath sounds: Normal breath sounds.  Abdominal:     General: Bowel sounds are normal.     Palpations: Abdomen is soft.  Musculoskeletal:        General: No swelling. Normal range of motion.     Cervical back: Normal range of motion and neck supple.  Skin:    General: Skin is warm and dry.     Coloration: Skin is not jaundiced.  Neurological:     General: No focal deficit present.     Mental Status: She is alert and oriented to person, place, and time.  Psychiatric:        Mood and Affect: Mood normal.        Behavior: Behavior normal.      Assessment: *Irritable bowel syndrome with constipation *Chronic GERD *Dysphagia-improved status post recent dilation  Plan: Continue on Nexium twice daily for chronic GERD.  Carafate on top of this as needed.  I refilled both today.  Continue OTC hemorrhoid cream.   For constipation, continue on [name] as well as liquid Dulcolax.  Recommended she experiment with Dulcolax dosing to decrease her toilet time.  Follow-up in 3 months.   05/31/2023 3:16 PM   Disclaimer: This note was dictated with voice recognition software. Similar sounding words can inadvertently be transcribed and may not be corrected upon review.

## 2023-06-29 DIAGNOSIS — K219 Gastro-esophageal reflux disease without esophagitis: Secondary | ICD-10-CM | POA: Diagnosis not present

## 2023-06-29 DIAGNOSIS — F408 Other phobic anxiety disorders: Secondary | ICD-10-CM | POA: Diagnosis not present

## 2023-06-29 DIAGNOSIS — F451 Undifferentiated somatoform disorder: Secondary | ICD-10-CM | POA: Diagnosis not present

## 2023-06-29 DIAGNOSIS — Z6826 Body mass index (BMI) 26.0-26.9, adult: Secondary | ICD-10-CM | POA: Diagnosis not present

## 2023-08-03 DIAGNOSIS — F408 Other phobic anxiety disorders: Secondary | ICD-10-CM | POA: Diagnosis not present

## 2023-08-03 DIAGNOSIS — K219 Gastro-esophageal reflux disease without esophagitis: Secondary | ICD-10-CM | POA: Diagnosis not present

## 2023-08-03 DIAGNOSIS — Z6826 Body mass index (BMI) 26.0-26.9, adult: Secondary | ICD-10-CM | POA: Diagnosis not present

## 2023-08-03 DIAGNOSIS — F451 Undifferentiated somatoform disorder: Secondary | ICD-10-CM | POA: Diagnosis not present

## 2023-08-04 DIAGNOSIS — K219 Gastro-esophageal reflux disease without esophagitis: Secondary | ICD-10-CM | POA: Diagnosis not present

## 2023-08-04 DIAGNOSIS — F451 Undifferentiated somatoform disorder: Secondary | ICD-10-CM | POA: Diagnosis not present

## 2023-08-04 DIAGNOSIS — E7849 Other hyperlipidemia: Secondary | ICD-10-CM | POA: Diagnosis not present

## 2023-08-04 DIAGNOSIS — F408 Other phobic anxiety disorders: Secondary | ICD-10-CM | POA: Diagnosis not present

## 2023-08-09 ENCOUNTER — Encounter: Payer: Self-pay | Admitting: Internal Medicine

## 2023-08-09 ENCOUNTER — Ambulatory Visit (INDEPENDENT_AMBULATORY_CARE_PROVIDER_SITE_OTHER): Payer: Medicare HMO | Admitting: Internal Medicine

## 2023-08-09 VITALS — BP 120/72 | HR 66 | Temp 97.5°F | Ht 63.0 in | Wt 162.4 lb

## 2023-08-09 DIAGNOSIS — R1319 Other dysphagia: Secondary | ICD-10-CM | POA: Diagnosis not present

## 2023-08-09 DIAGNOSIS — K219 Gastro-esophageal reflux disease without esophagitis: Secondary | ICD-10-CM

## 2023-08-09 DIAGNOSIS — G7000 Myasthenia gravis without (acute) exacerbation: Secondary | ICD-10-CM | POA: Diagnosis not present

## 2023-08-09 DIAGNOSIS — K625 Hemorrhage of anus and rectum: Secondary | ICD-10-CM | POA: Diagnosis not present

## 2023-08-09 DIAGNOSIS — K581 Irritable bowel syndrome with constipation: Secondary | ICD-10-CM

## 2023-08-09 NOTE — Patient Instructions (Signed)
Continue on Nexium and Carafate for your chronic reflux.  For your constipation, I will give you samples of Trulance to trial today.  Let me know in a week if this works better than the [name] and I can send in formal prescription.  Do not take these 2 medications together.  You can continue liquid Dulcolax on top of the Trulance.  Follow-up in 3 months or sooner if needed.  It was very nice seeing you again today.  Dr. Marletta Lor

## 2023-08-09 NOTE — Progress Notes (Signed)
Referring Provider: Toma Deiters, MD Primary Care Physician:  Toma Deiters, MD Primary GI:  Dr. Marletta Lor  Chief Complaint  Patient presents with   Rectal Bleeding    Patient has concerns of rectal bleeding. Bright red blood with stools. States it turns the water in toliet red. States this happens off and on for a few years. States her abdomen swells and she gets a lot of pressure.     HPI:   Yesenia Boyd is a 67 y.o. female who presents to clinic today for follow-up visit.  She has complicated past GI history including Niesen fundoplication x2, history of chronic dysphagia, constipation.    EGD 10/05/2020 for dysphagia showed esophageal stricture status post dilation with 18 mm balloon with moderate improvement as well as mucosal disruption.  Biopsies of the stomach negative for H. pylori.  Hyperplastic polyp removed from stomach.  EGD 11/29/2022 with evidence of prior Niesen fundoplication, dilated with 20 mm balloon.  Otherwise unremarkable.  Colonoscopy 01/26/2021 for rectal discomfort, unremarkable besides internal hemorrhoids with recommended 10-year recall.  Today she states she is doing okay.  States her dysphagia is improved s/p dilation.  Chronically takes Nexium twice daily, GERD.  Notes breakthrough symptoms of acid reflux and burning in her stomach. Has tried multiple PPIs prior. I gave her samples of Voquezna previously but states she did no take these because she read about possible side effects.   Constipation, currently taking [name] 24 mcg daily as well as liquid Dulcolax.  Previously trialed and failed Linzess due to abdominal pain.  Continues to have issues with complete evacuation, extended toilet time.  Also notes mild, intermittent rectal bleeding when her constipation "gets bad."  Past Medical History:  Diagnosis Date   Arthritis    Asthma    Complication of anesthesia    Deep depression following administration of propofol   Depression     Fibromyalgia    GERD (gastroesophageal reflux disease)    History of hiatal hernia    2 surgeries, Lap. Nissen Fundiplication    IBS (irritable bowel syndrome)    Myasthenia gravis    her body mimics the disease   Peripheral neuropathy    S/P colonoscopy    Dr. Linna Darner, internal hemorrhoids, repeat in Sept 2022   S/P endoscopy    Dr. Linna Darner 2008: removal of impacted food bolus, Dr. Linna Darner 2010: moderate gastritis, Sept 2012 with SLF: path with mild gastritis, no definite stricture noted, dilation with Savary 16 mm   Sciatica    Seizures (HCC)    Sleep apnea    Stroke (HCC)    TIA (transient ischemic attack) 2012    Past Surgical History:  Procedure Laterality Date   ABDOMINAL HYSTERECTOMY     complete per patient   BALLOON DILATION N/A 10/05/2020   Procedure: BALLOON DILATION;  Surgeon: Lanelle Bal, DO;  Location: [facility];  Service: Endoscopy;  Laterality: N/A;   BALLOON DILATION N/A 11/29/2022   Procedure: BALLOON DILATION;  Surgeon: Lanelle Bal, DO;  [facility]: [facility];  Service: Endoscopy;  Laterality: N/A;   BLADDER SURGERY     stretch bladder opening   CHOLECYSTECTOMY     COLONOSCOPY  07/18/2011   SLF: 1. internal hemorrhoids   COLONOSCOPY WITH PROPOFOL N/A 03/21/2017   Dr. Darrick Penna: Redundant left colon, hemorrhoids, next colonoscopy in 10 years.  Patient reports deep pression after propofol.   COLONOSCOPY WITH PROPOFOL N/A 01/26/2021   Procedure: COLONOSCOPY WITH PROPOFOL;  Surgeon: Lanelle Bal, DO;  [facility]: [facility];  Service: Endoscopy;  Laterality: N/A;  am appt   ESOPHAGEAL MANOMETRY N/A 01/21/2013   esophageal surgery?     2008 [facility]   ESOPHAGOGASTRODUODENOSCOPY  07/18/2011   SLF: 1. Moderate gastritis 2. Dysphagia most likely 2o large food bolus moving through her Nissen, which appears to be intact. Pt has poor denttition and NL BPE 2 years ago. 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    ESOPHAGOGASTRODUODENOSCOPY (EGD) WITH PROPOFOL N/A 08/21/2018   dr. Darrick Penna: mild gastritis, esophagus appeared normal. s/p dilation   ESOPHAGOGASTRODUODENOSCOPY (EGD) WITH PROPOFOL N/A 10/05/2020   Nissen, benign-appearing stenosis s/p dilation, gastric polyps biospied and normal duodenum. Hyperplastic polyp and reactive gastropathy noted.    ESOPHAGOGASTRODUODENOSCOPY (EGD) WITH PROPOFOL N/A 11/29/2022   Procedure: ESOPHAGOGASTRODUODENOSCOPY (EGD) WITH PROPOFOL;  Surgeon: Lanelle Bal, DO;  [facility];  Service: Endoscopy;  Laterality: N/A;  930am, asa 2   EVALUATION UNDER ANESTHESIA WITH HEMORRHOIDECTOMY N/A 03/27/2015   Procedure: EXAM UNDER ANESTHESIA WITH SPHINCTEROTOMY ;  Surgeon: Luretha Murphy, MD;  Location: [facility];  Service: General;  Laterality: N/A;   FLEXIBLE SIGMOIDOSCOPY N/A 12/09/2014   SLF: Rectal pain most likely due to internal and external hemorroids   HAND SURGERY     carpal tumnnel right and removal of cyst left   HEMORRHOID BANDING N/A 12/09/2014   Procedure: HEMORRHOID BANDING;  Surgeon: West Bali, MD;  Location: [facility];  Service: Endoscopy;  Laterality: N/A;   LAPAROSCOPIC LYSIS OF ADHESIONS  12/06/2022   Procedure: XI ROBOTIC LAPAROSCOPIC LYSIS OF ADHESIONS;  Surgeon: Franky Macho, MD;  Location: [facility];  Service: General;;   LAPAROSCOPIC NISSEN FUNDOPLICATION N/A 02/13/2013   Procedure: Redo LAPAROSCOPIC NISSEN FUNDOPLICATION;  Surgeon: Valarie Merino, MD;  Location: [facility];  Service: General;  Laterality: N/A;  Forgut explortation with partial  takedown nissan funliplication from 1994 for wrap torsion and persistant dysphagia   NASAL SEPTOPLASTY W/ TURBINOPLASTY Bilateral 08/03/2016   Procedure: NASAL SEPTOPLASTY WITH TURBINATE REDUCTION;  Surgeon: Newman Pies, MD;  Location: [facility];  Service: ENT;  Laterality: Bilateral;   NECK SURGERY     removal of "knot" from neck   NISSEN FUNDOPLICATION     initially in 1994. redo in 2014   POLYPECTOMY  10/05/2020    Procedure: POLYPECTOMY;  Surgeon: Lanelle Bal, DO;  [facility];  Service: Endoscopy;;   SAVORY DILATION  07/18/2011   Surgeon:Sandi M Fields   SAVORY DILATION  08/21/2018   Procedure: SAVORY DILATION;  Surgeon: West Bali, MD;  [facility]: [facility];  Service: Endoscopy;;    Current Outpatient Medications  Medication Sig Dispense Refill   acetaminophen (TYLENOL) 650 MG CR tablet Take 1,300 mg by mouth every 8 (eight) hours as needed for pain.     albuterol (VENTOLIN HFA) 108 (90 Base) MCG/ACT inhaler Inhale into the lungs every 6 (six) hours as needed for wheezing or shortness of breath.     Ascorbic Acid (VITAMIN C GUMMIE PO) Take 2-3 tablets by mouth every evening.     Biotin w/ Vitamins C & E (HAIR SKIN & NAILS GUMMIES PO) Take 2 tablets by mouth daily.     Cholecalciferol (VITAMIN D-3 PO) Take 10,000 Units by mouth at bedtime.     Coenzyme Q10 (CO Q-10) 100 MG CHEW Chew 600 mg by mouth daily.     diclofenac Sodium (VOLTAREN) 1 % GEL Apply  1 application  topically 2 (two) times daily.     EPINEPHrine 0.3 mg/0.3 mL IJ SOAJ injection Inject 0.3 mg into the muscle as needed for anaphylaxis.     esomeprazole (NEXIUM) 40 MG capsule Take 1 capsule (40 mg total) by mouth 2 (two) times daily before a meal. 180 capsule 3   estrogens, conjugated, (PREMARIN) 0.625 MG tablet Take 0.625 mg by mouth at bedtime.      Glucosamine-Chondroitin (MOVE FREE PO) Take 1 tablet by mouth every evening.      hydrOXYzine (ATARAX/VISTARIL) 25 MG tablet Take 25 mg by mouth at bedtime.      L-Methylfolate-B6-B12 (FOLTANX) 3-35-2 MG TABS Take 1 tablet by mouth daily.     loratadine (CLARITIN) 10 MG tablet Take 10 mg by mouth every evening.     lubiprostone ([name]) 24 MCG capsule Take 1 capsule (24 mcg total) by mouth 2 (two) times daily with a meal. 60 capsule 11   magnesium hydroxide (DULCOLAX) 400 MG/5ML suspension Take 15 mLs by mouth daily as needed for mild constipation.      montelukast (SINGULAIR) 10 MG tablet Take 10 mg by mouth at bedtime.     Multiple Vitamin (MULTIVITAMIN WITH MINERALS) TABS tablet Take 2 tablets by mouth every evening. Women's Multivitamin     NASACORT ALLERGY 24HR 55 MCG/ACT AERO nasal inhaler Place 1-2 sprays into the nose daily as needed (allergies).     ondansetron (ZOFRAN) 4 MG tablet Take 1 tablet (4 mg total) by mouth every 8 (eight) hours as needed for nausea or vomiting. 60 tablet 3   Polyethylene Glycol 400 (VISINE DRY EYE RELIEF OP) Place 1 drop into both eyes daily as needed (dry/ red eyes).     rizatriptan (MAXALT) 10 MG tablet Take 10 mg by mouth 2 (two) times daily as needed for migraine.      simethicone (MYLICON) 125 MG chewable tablet Chew 250 mg by mouth every 6 (six) hours as needed for flatulence.     sucralfate (CARAFATE) 1 GM/10ML suspension Take 10 mLs (1 g total) by mouth 4 (four) times daily -  with meals and at bedtime. 1260 mL 5   Tiotropium Bromide Monohydrate (SPIRIVA RESPIMAT) 2.5 MCG/ACT AERS Inhale 1 each into the lungs daily as needed (shortness of breath).     tiZANidine (ZANAFLEX) 4 MG tablet Take 4 mg by mouth at bedtime.     traZODone (DESYREL) 50 MG tablet Take 25 mg by mouth at bedtime as needed for sleep.     Turmeric (QC TUMERIC COMPLEX PO) Take 4 tablets by mouth at bedtime.     vitamin E 1000 UNIT capsule Take 1,000 Units by mouth at bedtime.      NITRO-BID 2 % ointment Apply 1 inch topically daily as needed (rectal bleeding). (Patient not taking: Reported on 08/09/2023)     No current facility-administered medications for this visit.    Allergies as of 08/09/2023 - Review Complete 08/09/2023  Allergen Reaction Noted   Aspirin Rash 07/28/2016   Avelox [moxifloxacin hcl in nacl] Anaphylaxis and Itching 06/20/2011   Linzess [linaclotide] Swelling 02/04/2013   Methocarbamol  11/29/2022   Penicillins Anaphylaxis and Hives    Adhesive [tape] Itching 01/24/2013   Carbamazepine Other (See Comments)  03/16/2015   Cephalexin Hives and Itching    Clindamycin/lincomycin Hives and Itching 12/17/2018   Codeine Hives and Itching    Cymbalta [duloxetine hcl] Other (See Comments) 06/20/2011   Darvon [propoxyphene] Hives 03/17/2015   Dicyclomine Itching and Other (  See Comments) 06/20/2011   Dicyclomine hcl Other (See Comments) 11/12/2018   Famotidine Nausea And Vomiting 03/16/2015   Hydrocodone-acetaminophen Itching 03/15/2017   Ketek [telithromycin] Hives and Itching 09/06/2017   Latex Itching 01/24/2013   Macrolides and ketolides Swelling 07/18/2011   Meperidine hcl Itching and Other (See Comments)    Metronidazole Hives and Other (See Comments) 06/20/2011   Morphine Itching and Other (See Comments)    Mupirocin Itching 12/17/2018   Prednisone Hives, Swelling, and Other (See Comments) 06/20/2011   Propofol Other (See Comments) 03/17/2015   Protonix [pantoprazole] Swelling 03/17/2015   Solodyn [minocycline hcl er] Itching and Swelling 11/21/2019   Sulfamethoxazole-trimethoprim Hives and Swelling 11/12/2018   Sulfonamide derivatives Hives    Tramadol hcl Hives and Other (See Comments)    Zylet [loteprednol-tobramycin] Hives and Swelling 03/15/2017    Family History  Problem Relation Age of Onset   Anesthesia problems Sister    Cancer Sister        breast   Hypertension Sister    Parkinson's disease Mother    Kidney disease Mother    Diabetes Mother    Heart attack Father    Cancer Father        stomach cancer   Hypertension Brother    Diabetes Brother    Arthritis Brother    Kidney disease Brother    Parkinson's disease Brother    Cancer Maternal Aunt        breast   Cancer Maternal Grandmother        breast   Multiple sclerosis Daughter    Colon cancer Cousin        4 cousins   Pseudochol deficiency Neg Hx    Hypotension Neg Hx    Malignant hyperthermia Neg Hx     Social History   Socioeconomic History   Marital status: Married    Spouse name: Not on file    Number of children: Not on file   Years of education: Not on file   Highest education level: Not on file  Occupational History   Not on file  Tobacco Use   Smoking status: Never    Passive exposure: Never   Smokeless tobacco: Never  Vaping Use   Vaping status: Never Used  Substance and Sexual Activity   Alcohol use: Not Currently    Comment: drinks wine "every blue moon"   Drug use: No   Sexual activity: Not Currently  Other Topics Concern   Not on file  Social History Narrative   Not on file   Social Determinants of Health   Financial Resource Strain: Not on file  Food Insecurity: Not on file  Transportation Needs: Not on file  Physical Activity: Inactive (03/14/2023)   Received [facility]   Exercise Vital Sign    Days of Exercise per Week: 0 days    Minutes of Exercise per Session: 0 min  Stress: Not on file  Social Connections: Not on file    Subjective: Review of Systems  Constitutional:  Negative for chills and fever.  HENT:  Negative for congestion and hearing loss.   Eyes:  Negative for blurred vision and double vision.  Respiratory:  Negative for cough and shortness of breath.   Cardiovascular:  Negative for chest pain and palpitations.  Gastrointestinal:  Positive for constipation and heartburn. Negative for abdominal pain, blood in stool, diarrhea, melena and vomiting.  Genitourinary:  Negative for dysuria and urgency.  Musculoskeletal:  Negative for joint  pain and myalgias.  Skin:  Negative for itching and rash.  Neurological:  Negative for dizziness and headaches.  Psychiatric/Behavioral:  Negative for depression. The patient is not nervous/anxious.      Objective: There were no vitals taken for this visit. Physical Exam Constitutional:      Appearance: Normal appearance.  HENT:     Head: Normocephalic and atraumatic.  Eyes:     Extraocular Movements: Extraocular movements intact.     Conjunctiva/sclera: Conjunctivae  normal.  Cardiovascular:     Rate and Rhythm: Normal rate and regular rhythm.  Pulmonary:     Effort: Pulmonary effort is normal.     Breath sounds: Normal breath sounds.  Abdominal:     General: Bowel sounds are normal.     Palpations: Abdomen is soft.  Musculoskeletal:        General: No swelling. Normal range of motion.     Cervical back: Normal range of motion and neck supple.  Skin:    General: Skin is warm and dry.     Coloration: Skin is not jaundiced.  Neurological:     General: No focal deficit present.     Mental Status: She is alert and oriented to person, place, and time.  Psychiatric:        Mood and Affect: Mood normal.        Behavior: Behavior normal.      Assessment: *Irritable bowel syndrome with constipation *Chronic GERD *Dysphagia-improved status post recent dilation  Plan: Continue on Nexium twice daily for chronic GERD.  Carafate on top of this as needed.   Continue OTC hemorrhoid cream.   For constipation, will give samples of Trulance to trial.  Call with update in 1 week and I can send in formal prescription if works better than [name].  Follow-up in 3 months.   08/09/2023 9:51 AM   Disclaimer: This note was dictated with voice recognition software. Similar sounding words can inadvertently be transcribed and may not be corrected upon review.

## 2023-08-13 ENCOUNTER — Encounter: Payer: Self-pay | Admitting: Internal Medicine

## 2023-08-30 DIAGNOSIS — K219 Gastro-esophageal reflux disease without esophagitis: Secondary | ICD-10-CM | POA: Diagnosis not present

## 2023-08-30 DIAGNOSIS — F451 Undifferentiated somatoform disorder: Secondary | ICD-10-CM | POA: Diagnosis not present

## 2023-08-30 DIAGNOSIS — F408 Other phobic anxiety disorders: Secondary | ICD-10-CM | POA: Diagnosis not present

## 2023-08-30 DIAGNOSIS — Z6826 Body mass index (BMI) 26.0-26.9, adult: Secondary | ICD-10-CM | POA: Diagnosis not present

## 2023-08-30 DIAGNOSIS — J310 Chronic rhinitis: Secondary | ICD-10-CM | POA: Diagnosis not present

## 2023-08-30 DIAGNOSIS — N182 Chronic kidney disease, stage 2 (mild): Secondary | ICD-10-CM | POA: Diagnosis not present

## 2023-08-31 ENCOUNTER — Ambulatory Visit: Payer: Medicare HMO | Admitting: Gastroenterology

## 2023-09-28 DIAGNOSIS — K219 Gastro-esophageal reflux disease without esophagitis: Secondary | ICD-10-CM | POA: Diagnosis not present

## 2023-09-28 DIAGNOSIS — J310 Chronic rhinitis: Secondary | ICD-10-CM | POA: Diagnosis not present

## 2023-09-28 DIAGNOSIS — F408 Other phobic anxiety disorders: Secondary | ICD-10-CM | POA: Diagnosis not present

## 2023-09-28 DIAGNOSIS — Z6826 Body mass index (BMI) 26.0-26.9, adult: Secondary | ICD-10-CM | POA: Diagnosis not present

## 2023-09-28 DIAGNOSIS — F451 Undifferentiated somatoform disorder: Secondary | ICD-10-CM | POA: Diagnosis not present

## 2023-09-28 DIAGNOSIS — N182 Chronic kidney disease, stage 2 (mild): Secondary | ICD-10-CM | POA: Diagnosis not present

## 2023-09-28 DIAGNOSIS — R1084 Generalized abdominal pain: Secondary | ICD-10-CM | POA: Diagnosis not present

## 2023-10-26 DIAGNOSIS — K219 Gastro-esophageal reflux disease without esophagitis: Secondary | ICD-10-CM | POA: Diagnosis not present

## 2023-10-26 DIAGNOSIS — E7849 Other hyperlipidemia: Secondary | ICD-10-CM | POA: Diagnosis not present

## 2023-10-26 DIAGNOSIS — J310 Chronic rhinitis: Secondary | ICD-10-CM | POA: Diagnosis not present

## 2023-10-26 DIAGNOSIS — Z6826 Body mass index (BMI) 26.0-26.9, adult: Secondary | ICD-10-CM | POA: Diagnosis not present

## 2023-10-26 DIAGNOSIS — N182 Chronic kidney disease, stage 2 (mild): Secondary | ICD-10-CM | POA: Diagnosis not present

## 2023-10-26 DIAGNOSIS — F451 Undifferentiated somatoform disorder: Secondary | ICD-10-CM | POA: Diagnosis not present

## 2023-10-26 DIAGNOSIS — F408 Other phobic anxiety disorders: Secondary | ICD-10-CM | POA: Diagnosis not present

## 2023-10-26 DIAGNOSIS — R1084 Generalized abdominal pain: Secondary | ICD-10-CM | POA: Diagnosis not present

## 2023-10-28 DIAGNOSIS — H5213 Myopia, bilateral: Secondary | ICD-10-CM | POA: Diagnosis not present

## 2023-10-28 DIAGNOSIS — H524 Presbyopia: Secondary | ICD-10-CM | POA: Diagnosis not present

## 2023-11-10 ENCOUNTER — Telehealth: Payer: Self-pay

## 2023-11-10 NOTE — Telephone Encounter (Signed)
 PA done for Nexium 40mg  DR cap. Dx used: K21.9 and R13.19. Pt's last ov note sent as well to [employer]. Waiting on a response from [employer].

## 2023-11-13 NOTE — Telephone Encounter (Signed)
 Documentation from [facility] advising pt Rx does not need an authorization. Pt notified of this. Documentation given to Darl Pikes for scan to chart.

## 2023-11-27 DIAGNOSIS — F451 Undifferentiated somatoform disorder: Secondary | ICD-10-CM | POA: Diagnosis not present

## 2023-11-27 DIAGNOSIS — Z0000 Encounter for general adult medical examination without abnormal findings: Secondary | ICD-10-CM | POA: Diagnosis not present

## 2023-11-27 DIAGNOSIS — E7849 Other hyperlipidemia: Secondary | ICD-10-CM | POA: Diagnosis not present

## 2023-11-27 DIAGNOSIS — K219 Gastro-esophageal reflux disease without esophagitis: Secondary | ICD-10-CM | POA: Diagnosis not present

## 2023-11-27 DIAGNOSIS — N182 Chronic kidney disease, stage 2 (mild): Secondary | ICD-10-CM | POA: Diagnosis not present

## 2023-11-27 DIAGNOSIS — R1084 Generalized abdominal pain: Secondary | ICD-10-CM | POA: Diagnosis not present

## 2023-11-27 DIAGNOSIS — F408 Other phobic anxiety disorders: Secondary | ICD-10-CM | POA: Diagnosis not present

## 2023-11-27 DIAGNOSIS — J310 Chronic rhinitis: Secondary | ICD-10-CM | POA: Diagnosis not present

## 2023-11-27 DIAGNOSIS — Z6827 Body mass index (BMI) 27.0-27.9, adult: Secondary | ICD-10-CM | POA: Diagnosis not present

## 2023-12-29 ENCOUNTER — Other Ambulatory Visit: Payer: Self-pay | Admitting: Internal Medicine

## 2023-12-29 DIAGNOSIS — K581 Irritable bowel syndrome with constipation: Secondary | ICD-10-CM

## 2024-01-02 DIAGNOSIS — J310 Chronic rhinitis: Secondary | ICD-10-CM | POA: Diagnosis not present

## 2024-01-02 DIAGNOSIS — R1084 Generalized abdominal pain: Secondary | ICD-10-CM | POA: Diagnosis not present

## 2024-01-02 DIAGNOSIS — F451 Undifferentiated somatoform disorder: Secondary | ICD-10-CM | POA: Diagnosis not present

## 2024-01-02 DIAGNOSIS — N182 Chronic kidney disease, stage 2 (mild): Secondary | ICD-10-CM | POA: Diagnosis not present

## 2024-01-02 DIAGNOSIS — Z6827 Body mass index (BMI) 27.0-27.9, adult: Secondary | ICD-10-CM | POA: Diagnosis not present

## 2024-01-02 DIAGNOSIS — E7849 Other hyperlipidemia: Secondary | ICD-10-CM | POA: Diagnosis not present

## 2024-01-02 DIAGNOSIS — F408 Other phobic anxiety disorders: Secondary | ICD-10-CM | POA: Diagnosis not present

## 2024-01-02 DIAGNOSIS — K219 Gastro-esophageal reflux disease without esophagitis: Secondary | ICD-10-CM | POA: Diagnosis not present

## 2024-01-02 DIAGNOSIS — Z0000 Encounter for general adult medical examination without abnormal findings: Secondary | ICD-10-CM | POA: Diagnosis not present

## 2024-01-25 ENCOUNTER — Other Ambulatory Visit: Payer: Self-pay | Admitting: Internal Medicine

## 2024-01-25 DIAGNOSIS — K581 Irritable bowel syndrome with constipation: Secondary | ICD-10-CM

## 2024-02-05 ENCOUNTER — Telehealth: Payer: Self-pay

## 2024-02-05 DIAGNOSIS — Z6827 Body mass index (BMI) 27.0-27.9, adult: Secondary | ICD-10-CM | POA: Diagnosis not present

## 2024-02-05 DIAGNOSIS — F408 Other phobic anxiety disorders: Secondary | ICD-10-CM | POA: Diagnosis not present

## 2024-02-05 DIAGNOSIS — J310 Chronic rhinitis: Secondary | ICD-10-CM | POA: Diagnosis not present

## 2024-02-05 DIAGNOSIS — R5383 Other fatigue: Secondary | ICD-10-CM | POA: Diagnosis not present

## 2024-02-05 DIAGNOSIS — R1084 Generalized abdominal pain: Secondary | ICD-10-CM | POA: Diagnosis not present

## 2024-02-05 DIAGNOSIS — F451 Undifferentiated somatoform disorder: Secondary | ICD-10-CM | POA: Diagnosis not present

## 2024-02-05 DIAGNOSIS — N182 Chronic kidney disease, stage 2 (mild): Secondary | ICD-10-CM | POA: Diagnosis not present

## 2024-02-05 DIAGNOSIS — E7849 Other hyperlipidemia: Secondary | ICD-10-CM | POA: Diagnosis not present

## 2024-02-05 DIAGNOSIS — K219 Gastro-esophageal reflux disease without esophagitis: Secondary | ICD-10-CM | POA: Diagnosis not present

## 2024-02-05 NOTE — Telephone Encounter (Signed)
 Pt needs to schedule a f/u appt with Dr. Marletta Lor or Tobi Bastos for refills on lubiprostone.

## 2024-02-09 NOTE — Telephone Encounter (Signed)
 Left message to schedule an appointment to get refills on lubiprostone

## 2024-03-05 DIAGNOSIS — M810 Age-related osteoporosis without current pathological fracture: Secondary | ICD-10-CM | POA: Diagnosis not present

## 2024-03-05 DIAGNOSIS — Z780 Asymptomatic menopausal state: Secondary | ICD-10-CM | POA: Diagnosis not present

## 2024-03-07 DIAGNOSIS — R1084 Generalized abdominal pain: Secondary | ICD-10-CM | POA: Diagnosis not present

## 2024-03-07 DIAGNOSIS — E7849 Other hyperlipidemia: Secondary | ICD-10-CM | POA: Diagnosis not present

## 2024-03-07 DIAGNOSIS — F408 Other phobic anxiety disorders: Secondary | ICD-10-CM | POA: Diagnosis not present

## 2024-03-07 DIAGNOSIS — S93692A Other sprain of left foot, initial encounter: Secondary | ICD-10-CM | POA: Diagnosis not present

## 2024-03-07 DIAGNOSIS — K219 Gastro-esophageal reflux disease without esophagitis: Secondary | ICD-10-CM | POA: Diagnosis not present

## 2024-03-07 DIAGNOSIS — F451 Undifferentiated somatoform disorder: Secondary | ICD-10-CM | POA: Diagnosis not present

## 2024-03-07 DIAGNOSIS — J310 Chronic rhinitis: Secondary | ICD-10-CM | POA: Diagnosis not present

## 2024-03-07 DIAGNOSIS — R5383 Other fatigue: Secondary | ICD-10-CM | POA: Diagnosis not present

## 2024-03-07 DIAGNOSIS — N182 Chronic kidney disease, stage 2 (mild): Secondary | ICD-10-CM | POA: Diagnosis not present

## 2024-03-27 ENCOUNTER — Ambulatory Visit (INDEPENDENT_AMBULATORY_CARE_PROVIDER_SITE_OTHER): Admitting: Internal Medicine

## 2024-03-27 ENCOUNTER — Encounter (INDEPENDENT_AMBULATORY_CARE_PROVIDER_SITE_OTHER): Payer: Self-pay

## 2024-03-27 ENCOUNTER — Encounter: Payer: Self-pay | Admitting: Internal Medicine

## 2024-03-27 VITALS — BP 129/79 | HR 79 | Temp 98.7°F | Ht 63.0 in | Wt 167.8 lb

## 2024-03-27 DIAGNOSIS — K581 Irritable bowel syndrome with constipation: Secondary | ICD-10-CM | POA: Diagnosis not present

## 2024-03-27 DIAGNOSIS — R1310 Dysphagia, unspecified: Secondary | ICD-10-CM

## 2024-03-27 DIAGNOSIS — K648 Other hemorrhoids: Secondary | ICD-10-CM

## 2024-03-27 DIAGNOSIS — K219 Gastro-esophageal reflux disease without esophagitis: Secondary | ICD-10-CM | POA: Diagnosis not present

## 2024-03-27 DIAGNOSIS — R1319 Other dysphagia: Secondary | ICD-10-CM

## 2024-03-27 MED ORDER — ESOMEPRAZOLE MAGNESIUM 40 MG PO CPDR: 40.0000 mg | DELAYED_RELEASE_CAPSULE | Freq: Two times a day (BID) | ORAL | 3 refills | Status: AC

## 2024-03-27 MED ORDER — SUCRALFATE 1 G PO TABS: 1.0000 g | ORAL_TABLET | Freq: Three times a day (TID) | ORAL | 11 refills | Status: AC

## 2024-03-27 MED ORDER — LUBIPROSTONE 24 MCG PO CAPS: 24.0000 ug | ORAL_CAPSULE | Freq: Two times a day (BID) | ORAL | 11 refills | Status: AC

## 2024-03-27 MED ORDER — ONDANSETRON HCL 4 MG PO TABS: 4.0000 mg | ORAL_TABLET | Freq: Three times a day (TID) | ORAL | 3 refills | Status: AC | PRN

## 2024-03-27 NOTE — Patient Instructions (Addendum)
 For your chronic, I refilled your Nexium  today.  I have also refilled ondansetron  to take as needed for nausea.  Carafate  tablets sent to your pharmacy.  You can crush these up and mix with water .  If this does not work out as well then let me know and I will change it back to the liquid Carafate .  I will send in compounded hemorrhoid cream to [facility].  Continue on [name]  for your chronic constipation.  Refill sent to your pharmacy today.  Follow-up in 2 to 3 months.  It was very nice seeing you again today.  Dr. Mordechai April

## 2024-03-27 NOTE — Progress Notes (Signed)
 Referring Provider: Veda Gerald, MD Primary Care Physician:  Veda Gerald, MD Primary GI:  Dr. Mordechai April  Chief Complaint  Patient presents with   Follow-up    Patient here today for a follow up on her IBS with Constipation. She takes amitiza  24 mcg bid, and Miralax at bedtime. She says this does control her constipation. She will need a refill on the Amitzia and Esomeprazole  40 bid. She also uses dulcolax daily.     HPI:   Yesenia Boyd is a 68 y.o. female who presents to clinic today for follow-up visit.  She has complicated past GI history including Niesen fundoplication x2, history of chronic dysphagia, constipation.    EGD 10/05/2020 for dysphagia showed esophageal stricture status post dilation with 18 mm balloon with moderate improvement as well as mucosal disruption.  Biopsies of the stomach negative for H. pylori.  Hyperplastic polyp removed from stomach.  EGD 11/29/2022 with evidence of prior Niesen fundoplication, dilated with 20 mm balloon.  Otherwise unremarkable.  Colonoscopy 01/26/2021 for rectal discomfort, unremarkable besides internal hemorrhoids with recommended 10-year recall.  Today she states she is doing okay.  States her dysphagia is improved s/p dilation.  Chronic GERD: Chronically takes Nexium  twice daily.  Notes breakthrough symptoms of acid reflux and burning in her stomach. Has tried multiple PPIs prior. I gave her samples of Voquezna previously but states she did no take these because she read about possible side effects.   Constipation, currently taking [name]  24 mcg daily as well as liquid Dulcolax.  Previously trialed and failed Linzess  due to abdominal pain.  Continues to have issues with complete evacuation, extended toilet time.  Also notes mild, intermittent rectal bleeding when her constipation "gets bad."  Complaints to me about worsening hemorrhoids as well.  Feels like one of her hemorrhoids is popping out at times.  She has to push it back  in herself.  She has a history of anal stenosis as well status post being durotomy 2016.  Past Medical History:  Diagnosis Date   Arthritis    Asthma    Complication of anesthesia    Deep depression following administration of propofol    Depression    Fibromyalgia    GERD (gastroesophageal reflux disease)    History of hiatal hernia    2 surgeries, Lap. Nissen Fundiplication    IBS (irritable bowel syndrome)    Myasthenia gravis    her body mimics the disease   Peripheral neuropathy    S/P colonoscopy    Dr. Alejos Husband, internal hemorrhoids, repeat in Sept 2022   S/P endoscopy    Dr. Alejos Husband 2008: removal of impacted food bolus, Dr. Alejos Husband 2010: moderate gastritis, Sept 2012 with SLF: path with mild gastritis, no definite stricture noted, dilation with Savary 16 mm   Sciatica    Seizures (HCC)    Sleep apnea    Stroke (HCC)    TIA (transient ischemic attack) 2012    Past Surgical History:  Procedure Laterality Date   ABDOMINAL HYSTERECTOMY     complete per patient   BALLOON DILATION N/A 10/05/2020   Procedure: BALLOON DILATION;  Surgeon: Vinetta Greening, DO;  [facility]: [facility];  Service: Endoscopy;  Laterality: N/A;   BALLOON DILATION N/A 11/29/2022   Procedure: BALLOON DILATION;  Surgeon: Vinetta Greening, DO;  [facility]: [facility];  Service: Endoscopy;  Laterality: N/A;   BLADDER SURGERY     stretch bladder opening   CHOLECYSTECTOMY  COLONOSCOPY  07/18/2011   SLF: 1. internal hemorrhoids   COLONOSCOPY WITH PROPOFOL  N/A 03/21/2017   Dr. Nolene Baumgarten: Redundant left colon, hemorrhoids, next colonoscopy in 10 years.  Patient reports deep pression after propofol .   COLONOSCOPY WITH PROPOFOL  N/A 01/26/2021   Procedure: COLONOSCOPY WITH PROPOFOL ;  Surgeon: Vinetta Greening, DO;  [facility]: [facility];  Service: Endoscopy;  Laterality: N/A;  am appt   ESOPHAGEAL MANOMETRY N/A 01/21/2013   esophageal surgery?     2008 [facility]   ESOPHAGOGASTRODUODENOSCOPY  07/18/2011   SLF: 1.  Moderate gastritis 2. Dysphagia most likely 2o large food bolus moving through her Nissen, which appears to be intact. Pt has poor denttition and NL BPE 2 years ago. 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   ESOPHAGOGASTRODUODENOSCOPY (EGD) WITH PROPOFOL  N/A 08/21/2018   dr. Nolene Baumgarten: mild gastritis, esophagus appeared normal. s/p dilation   ESOPHAGOGASTRODUODENOSCOPY (EGD) WITH PROPOFOL  N/A 10/05/2020   Nissen, benign-appearing stenosis s/p dilation, gastric polyps biospied and normal duodenum. Hyperplastic polyp and reactive gastropathy noted.    ESOPHAGOGASTRODUODENOSCOPY (EGD) WITH PROPOFOL  N/A 11/29/2022   Procedure: ESOPHAGOGASTRODUODENOSCOPY (EGD) WITH PROPOFOL ;  Surgeon: Vinetta Greening, DO;  [facility];  Service: Endoscopy;  Laterality: N/A;  930am, asa 2   EVALUATION UNDER ANESTHESIA WITH HEMORRHOIDECTOMY N/A 03/27/2015   Procedure: EXAM UNDER ANESTHESIA WITH SPHINCTEROTOMY ;  Surgeon: Jacolyn Matar, MD;  Location: [facility];  Service: General;  Laterality: N/A;   FLEXIBLE SIGMOIDOSCOPY N/A 12/09/2014   SLF: Rectal pain most likely due to internal and external hemorroids   HAND SURGERY     carpal tumnnel right and removal of cyst left   HEMORRHOID BANDING N/A 12/09/2014   Procedure: HEMORRHOID BANDING;  Surgeon: Alyce Jubilee, MD;  Location: [facility];  Service: Endoscopy;  Laterality: N/A;   LAPAROSCOPIC LYSIS OF ADHESIONS  12/06/2022   Procedure: XI ROBOTIC LAPAROSCOPIC LYSIS OF ADHESIONS;  Surgeon: Alanda Allegra, MD;  Location: [facility];  Service: General;;   LAPAROSCOPIC NISSEN FUNDOPLICATION N/A 02/13/2013   Procedure: Redo LAPAROSCOPIC NISSEN FUNDOPLICATION;  Surgeon: Azucena Bollard, MD;  Location: [facility];  Service: General;  Laterality: N/A;  Forgut explortation with partial  takedown nissan funliplication from 1994 for wrap torsion and persistant dysphagia   NASAL SEPTOPLASTY W/ TURBINOPLASTY Bilateral 08/03/2016   Procedure: NASAL  SEPTOPLASTY WITH TURBINATE REDUCTION;  Surgeon: Reynold Caves, MD;  Location: [facility];  Service: ENT;  Laterality: Bilateral;   NECK SURGERY     removal of "knot" from neck   NISSEN FUNDOPLICATION     initially in 1994. redo in 2014   POLYPECTOMY  10/05/2020   Procedure: POLYPECTOMY;  Surgeon: Vinetta Greening, DO;  [facility]: [facility];  Service: Endoscopy;;   SAVORY DILATION  07/18/2011   Surgeon:Sandi M Fields   SAVORY DILATION  08/21/2018   Procedure: SAVORY DILATION;  Surgeon: Alyce Jubilee, MD;  [facility]: [facility];  Service: Endoscopy;;    Current Outpatient Medications  Medication Sig Dispense Refill   acetaminophen  (TYLENOL ) 650 MG CR tablet Take 1,300 mg by mouth every 8 (eight) hours as needed for pain.     albuterol  (VENTOLIN  HFA) 108 (90 Base) MCG/ACT inhaler Inhale into the lungs every 6 (six) hours as needed for wheezing or shortness of breath.     Ascorbic Acid (VITAMIN C GUMMIE PO) Take 2-3 tablets by mouth every evening.     Biotin w/ Vitamins C & E (HAIR SKIN & NAILS GUMMIES  PO) Take 2 tablets by mouth daily.     Cholecalciferol (VITAMIN D-3 PO) Take 10,000 Units by mouth at bedtime.     Coenzyme Q10 (CO Q-10) 100 MG CHEW Chew 600 mg by mouth daily.     cyanocobalamin 1000 MCG tablet Take 1,000 mcg by mouth daily.     diclofenac Sodium (VOLTAREN) 1 % GEL Apply 1 application  topically 2 (two) times daily.     EPINEPHrine  0.3 mg/0.3 mL IJ SOAJ injection Inject 0.3 mg into the muscle as needed for anaphylaxis.     esomeprazole  (NEXIUM ) 40 MG capsule Take 1 capsule (40 mg total) by mouth 2 (two) times daily before a meal. 180 capsule 3   estrogens, conjugated, (PREMARIN) 0.625 MG tablet Take 0.625 mg by mouth at bedtime.      Glucosamine-Chondroitin (MOVE FREE PO) Take 1 tablet by mouth every evening.      hydrOXYzine (ATARAX/VISTARIL) 25 MG tablet Take 25 mg by mouth at bedtime.      L-Methylfolate-B6-B12 (FOLTANX) 3-35-2 MG TABS Take 1 tablet by mouth daily.      loratadine (CLARITIN) 10 MG tablet Take 10 mg by mouth every evening.     lubiprostone  ([name] ) 24 MCG capsule TAKE 1 CAPSULE BY MOUTH TWICE DAILY WITH A MEAL 60 capsule 0   magnesium  hydroxide (DULCOLAX) 400 MG/5ML suspension Take 15 mLs by mouth daily as needed for mild constipation.     montelukast (SINGULAIR) 10 MG tablet Take 10 mg by mouth at bedtime.     Multiple Vitamin (MULTIVITAMIN WITH MINERALS) TABS tablet Take 2 tablets by mouth every evening. Women's Multivitamin     NASACORT ALLERGY 24HR 55 MCG/ACT AERO nasal inhaler Place 1-2 sprays into the nose daily as needed (allergies).     ondansetron  (ZOFRAN ) 4 MG tablet Take 1 tablet (4 mg total) by mouth every 8 (eight) hours as needed for nausea or vomiting. 60 tablet 3   Polyethylene Glycol 400 (VISINE DRY EYE RELIEF OP) Place 1 drop into both eyes daily as needed (dry/ red eyes).     simethicone  (MYLICON) 125 MG chewable tablet Chew 250 mg by mouth every 6 (six) hours as needed for flatulence.     sucralfate  (CARAFATE ) 1 GM/10ML suspension Take 10 mLs (1 g total) by mouth 4 (four) times daily -  with meals and at bedtime. 1260 mL 5   Tiotropium Bromide Monohydrate (SPIRIVA RESPIMAT) 2.5 MCG/ACT AERS Inhale 1 each into the lungs daily as needed (shortness of breath).     tiZANidine (ZANAFLEX) 4 MG tablet Take 4 mg by mouth at bedtime.     traZODone (DESYREL) 50 MG tablet Take 25 mg by mouth at bedtime as needed for sleep.     Turmeric (QC TUMERIC COMPLEX PO) Take 4 tablets by mouth at bedtime.     vitamin E 1000 UNIT capsule Take 1,000 Units by mouth at bedtime.      rizatriptan (MAXALT) 10 MG tablet Take 10 mg by mouth 2 (two) times daily as needed for migraine.  (Patient not taking: Reported on 03/27/2024)     No current facility-administered medications for this visit.    Allergies as of 03/27/2024 - Review Complete 03/27/2024  Allergen Reaction Noted   Aspirin Rash 07/28/2016   Avelox [moxifloxacin hcl in nacl] Anaphylaxis and  Itching 06/20/2011   Linzess  [linaclotide ] Swelling 02/04/2013   Methocarbamol  11/29/2022   Penicillins Anaphylaxis and Hives    Adhesive [tape] Itching 01/24/2013   Carbamazepine Other (See Comments) 03/16/2015  Cephalexin Hives and Itching    Clindamycin/lincomycin Hives and Itching 12/17/2018   Codeine Hives and Itching    Cymbalta [duloxetine hcl] Other (See Comments) 06/20/2011   Darvon [propoxyphene] Hives 03/17/2015   Dicyclomine Itching and Other (See Comments) 06/20/2011   Dicyclomine hcl Other (See Comments) 11/12/2018   Famotidine Nausea And Vomiting 03/16/2015   Hydrocodone -acetaminophen  Itching 03/15/2017   Ketek [telithromycin] Hives and Itching 09/06/2017   Latex Itching 01/24/2013   Macrolides and ketolides Swelling 07/18/2011   Meperidine hcl Itching and Other (See Comments)    Metronidazole Hives and Other (See Comments) 06/20/2011   Morphine Itching and Other (See Comments)    Mupirocin Itching 12/17/2018   Prednisone Hives, Swelling, and Other (See Comments) 06/20/2011   Propofol  Other (See Comments) 03/17/2015   Protonix [pantoprazole] Swelling 03/17/2015   Solodyn [minocycline hcl er] Itching and Swelling 11/21/2019   Sulfamethoxazole-trimethoprim Hives and Swelling 11/12/2018   Sulfonamide derivatives Hives    Tramadol hcl Hives and Other (See Comments)    Zylet [loteprednol-tobramycin] Hives and Swelling 03/15/2017    Family History  Problem Relation Age of Onset   Anesthesia problems Sister    Cancer Sister        breast   Hypertension Sister    Parkinson's disease Mother    Kidney disease Mother    Diabetes Mother    Heart attack Father    Cancer Father        stomach cancer   Hypertension Brother    Diabetes Brother    Arthritis Brother    Kidney disease Brother    Parkinson's disease Brother    Cancer Maternal Aunt        breast   Cancer Maternal Grandmother        breast   Multiple sclerosis Daughter    Colon cancer Cousin        4  cousins   Pseudochol deficiency Neg Hx    Hypotension Neg Hx    Malignant hyperthermia Neg Hx     Social History   Socioeconomic History   Marital status: Married    Spouse name: Not on file   Number of children: Not on file   Years of education: Not on file   Highest education level: Not on file  Occupational History   Not on file  Tobacco Use   Smoking status: Never    Passive exposure: Never   Smokeless tobacco: Never  Vaping Use   Vaping status: Never Used  Substance and Sexual Activity   Alcohol use: Not Currently    Comment: drinks wine "every blue moon"   Drug use: No   Sexual activity: Not Currently  Other Topics Concern   Not on file  Social History Narrative   Not on file   Social Drivers of Health   Financial Resource Strain: Not on file  Food Insecurity: Not on file  Transportation Needs: Not on file  Physical Activity: Inactive (03/14/2023)   Received from [facility], [facility]   Exercise Vital Sign    Days of Exercise per Week: 0 days    Minutes of Exercise per Session: 0 min  Stress: Not on file  Social Connections: Not on file    Subjective: Review of Systems  Constitutional:  Negative for chills and fever.  HENT:  Negative for congestion and hearing loss.   Eyes:  Negative for blurred vision and double vision.  Respiratory:  Negative for cough and shortness of breath.   Cardiovascular:  Negative for chest pain and palpitations.  Gastrointestinal:  Positive for constipation and heartburn. Negative for abdominal pain, blood in stool, diarrhea, melena and vomiting.  Genitourinary:  Negative for dysuria and urgency.  Musculoskeletal:  Negative for joint pain and myalgias.  Skin:  Negative for itching and rash.  Neurological:  Negative for dizziness and headaches.  Psychiatric/Behavioral:  Negative for depression. The patient is not nervous/anxious.      Objective: BP 129/79 (BP Location: Left Arm, Patient Position: Sitting, Cuff  Size: Normal)   Pulse 79   Temp 98.7 F (37.1 C) (Temporal)   Ht 5\' 3"  (1.6 m)   Wt 167 lb 12.8 oz (76.1 kg)   BMI 29.72 kg/m  Physical Exam Constitutional:      Appearance: Normal appearance.  HENT:     Head: Normocephalic and atraumatic.  Eyes:     Extraocular Movements: Extraocular movements intact.     Conjunctiva/sclera: Conjunctivae normal.  Cardiovascular:     Rate and Rhythm: Normal rate and regular rhythm.  Pulmonary:     Effort: Pulmonary effort is normal.     Breath sounds: Normal breath sounds.  Abdominal:     General: Bowel sounds are normal.     Palpations: Abdomen is soft.  Musculoskeletal:        General: No swelling. Normal range of motion.     Cervical back: Normal range of motion and neck supple.  Skin:    General: Skin is warm and dry.     Coloration: Skin is not jaundiced.  Neurological:     General: No focal deficit present.     Mental Status: She is alert and oriented to person, place, and time.  Psychiatric:        Mood and Affect: Mood normal.        Behavior: Behavior normal.   Rectal exam: No external hemorrhoids, no anal fissure.  No perianal excoriation.  Anal sphincter is mildly tight.  No prolapse hemorrhoids identified.   Assessment: *Irritable bowel syndrome with constipation *Chronic GERD *Dysphagia-improved status post recent dilation  Plan: Continue on Nexium  twice daily for chronic GERD.  Carafate  on top of this as needed.  Refill today.  Will send in compounded hemorrhoid cream to patient's pharmacy.  Consider hemorrhoid banding.  For constipation, continue on [name] .  Refill today.  Follow-up in 3 months.   03/27/2024 10:20 AM   Disclaimer: This note was dictated with voice recognition software. Similar sounding words can inadvertently be transcribed and may not be corrected upon review.

## 2024-04-11 DIAGNOSIS — E7849 Other hyperlipidemia: Secondary | ICD-10-CM | POA: Diagnosis not present

## 2024-04-11 DIAGNOSIS — K219 Gastro-esophageal reflux disease without esophagitis: Secondary | ICD-10-CM | POA: Diagnosis not present

## 2024-04-11 DIAGNOSIS — J310 Chronic rhinitis: Secondary | ICD-10-CM | POA: Diagnosis not present

## 2024-04-11 DIAGNOSIS — R1084 Generalized abdominal pain: Secondary | ICD-10-CM | POA: Diagnosis not present

## 2024-04-11 DIAGNOSIS — Z6827 Body mass index (BMI) 27.0-27.9, adult: Secondary | ICD-10-CM | POA: Diagnosis not present

## 2024-04-11 DIAGNOSIS — F408 Other phobic anxiety disorders: Secondary | ICD-10-CM | POA: Diagnosis not present

## 2024-04-11 DIAGNOSIS — F451 Undifferentiated somatoform disorder: Secondary | ICD-10-CM | POA: Diagnosis not present

## 2024-04-11 DIAGNOSIS — N182 Chronic kidney disease, stage 2 (mild): Secondary | ICD-10-CM | POA: Diagnosis not present

## 2024-04-12 DIAGNOSIS — N182 Chronic kidney disease, stage 2 (mild): Secondary | ICD-10-CM | POA: Diagnosis not present

## 2024-04-12 DIAGNOSIS — E7849 Other hyperlipidemia: Secondary | ICD-10-CM | POA: Diagnosis not present

## 2024-05-02 ENCOUNTER — Encounter: Payer: Self-pay | Admitting: Internal Medicine

## 2024-05-06 DIAGNOSIS — N6489 Other specified disorders of breast: Secondary | ICD-10-CM | POA: Diagnosis not present

## 2024-05-06 DIAGNOSIS — R928 Other abnormal and inconclusive findings on diagnostic imaging of breast: Secondary | ICD-10-CM | POA: Diagnosis not present

## 2024-05-09 DIAGNOSIS — Z6827 Body mass index (BMI) 27.0-27.9, adult: Secondary | ICD-10-CM | POA: Diagnosis not present

## 2024-05-09 DIAGNOSIS — K219 Gastro-esophageal reflux disease without esophagitis: Secondary | ICD-10-CM | POA: Diagnosis not present

## 2024-05-09 DIAGNOSIS — J310 Chronic rhinitis: Secondary | ICD-10-CM | POA: Diagnosis not present

## 2024-05-09 DIAGNOSIS — R5383 Other fatigue: Secondary | ICD-10-CM | POA: Diagnosis not present

## 2024-05-09 DIAGNOSIS — E7849 Other hyperlipidemia: Secondary | ICD-10-CM | POA: Diagnosis not present

## 2024-05-09 DIAGNOSIS — R1084 Generalized abdominal pain: Secondary | ICD-10-CM | POA: Diagnosis not present

## 2024-05-09 DIAGNOSIS — F408 Other phobic anxiety disorders: Secondary | ICD-10-CM | POA: Diagnosis not present

## 2024-05-09 DIAGNOSIS — F451 Undifferentiated somatoform disorder: Secondary | ICD-10-CM | POA: Diagnosis not present

## 2024-05-09 DIAGNOSIS — N182 Chronic kidney disease, stage 2 (mild): Secondary | ICD-10-CM | POA: Diagnosis not present

## 2024-06-22 DIAGNOSIS — Z79899 Other long term (current) drug therapy: Secondary | ICD-10-CM | POA: Diagnosis not present

## 2024-06-22 DIAGNOSIS — R079 Chest pain, unspecified: Secondary | ICD-10-CM | POA: Diagnosis not present

## 2024-06-22 DIAGNOSIS — M797 Fibromyalgia: Secondary | ICD-10-CM | POA: Diagnosis not present

## 2024-06-22 DIAGNOSIS — K219 Gastro-esophageal reflux disease without esophagitis: Secondary | ICD-10-CM | POA: Diagnosis not present

## 2024-06-22 DIAGNOSIS — R0789 Other chest pain: Secondary | ICD-10-CM | POA: Diagnosis not present

## 2024-06-22 DIAGNOSIS — R110 Nausea: Secondary | ICD-10-CM | POA: Diagnosis not present

## 2024-06-22 DIAGNOSIS — R410 Disorientation, unspecified: Secondary | ICD-10-CM | POA: Diagnosis not present

## 2024-06-22 DIAGNOSIS — R5383 Other fatigue: Secondary | ICD-10-CM | POA: Diagnosis not present

## 2024-06-24 ENCOUNTER — Telehealth: Payer: Self-pay

## 2024-06-24 NOTE — Telephone Encounter (Signed)
Returned call to pt//[email or web address]  

## 2024-06-26 ENCOUNTER — Encounter: Payer: Self-pay | Admitting: Internal Medicine

## 2024-06-26 ENCOUNTER — Ambulatory Visit (INDEPENDENT_AMBULATORY_CARE_PROVIDER_SITE_OTHER): Admitting: Internal Medicine

## 2024-06-26 VITALS — BP 135/86 | HR 77 | Temp 97.5°F | Ht 63.0 in | Wt 165.9 lb

## 2024-06-26 DIAGNOSIS — R079 Chest pain, unspecified: Secondary | ICD-10-CM

## 2024-06-26 DIAGNOSIS — K219 Gastro-esophageal reflux disease without esophagitis: Secondary | ICD-10-CM

## 2024-06-26 DIAGNOSIS — K581 Irritable bowel syndrome with constipation: Secondary | ICD-10-CM

## 2024-06-26 DIAGNOSIS — R1310 Dysphagia, unspecified: Secondary | ICD-10-CM | POA: Diagnosis not present

## 2024-06-26 DIAGNOSIS — R1319 Other dysphagia: Secondary | ICD-10-CM

## 2024-06-26 NOTE — Progress Notes (Signed)
 Referring Provider: Orpha Yancey LABOR, MD Primary Care Physician:  Orpha Yancey LABOR, MD Primary GI:  Dr. Cindie  Chief Complaint  Patient presents with   Follow-up    Patient says she is here for a follow up from her recent Ed visit to [facility] due to chest discomfort . Patient says she is having issues with dysphagia, nausea, epigastric pain. She has a history of Gerd, and IBS with constipation. Patient is taking Carafate  2-3 times per day, esomeprazole  40 mg bid. She uses gas x and has been having to use zofran  prn nausea.     HPI:   Yesenia Boyd is a 68 y.o. female who presents to clinic today for follow-up visit.  She has complicated past GI history including Niesen fundoplication x2, history of chronic dysphagia, constipation.    Chronic GERD: Chronically takes Nexium  twice daily.  Notes breakthrough symptoms of acid reflux and burning in her stomach. Has tried multiple PPIs prior. I gave her samples of Voquezna previously but states she did no take these because she read about possible side effects.   Was doing relatively okay though had exacerbation of her symptoms prompting ER visit at [facility].  Workup unremarkable.  Discharged home.  Feels like her dysphagia is worsening again.  Having significant reflux.  Food is getting stuck in her substernal region.  Also complaining of chest pain and swelling in between her breast.  EGD 10/05/2020 for dysphagia showed esophageal stricture status post dilation with 18 mm balloon with moderate improvement as well as mucosal disruption.  Biopsies of the stomach negative for H. pylori.  Hyperplastic polyp removed from stomach.  EGD 11/29/2022 with evidence of prior Niesen fundoplication, dilated with 20 mm balloon.  Otherwise unremarkable.  Constipation, currently taking [name]  24 mcg daily as well as liquid Dulcolax.  Previously trialed and failed Linzess  due to abdominal pain.  Continues to have issues with complete evacuation,  extended toilet time.  Also notes mild, intermittent rectal bleeding when her constipation gets bad.  History of anal stenosis in the past.  Colon cancer screening: Colonoscopy 01/26/2021 for rectal discomfort, unremarkable besides internal hemorrhoids with recommended 10-year recall.  Past Medical History:  Diagnosis Date   Arthritis    Asthma    Complication of anesthesia    Deep depression following administration of propofol    Depression    Fibromyalgia    GERD (gastroesophageal reflux disease)    History of hiatal hernia    2 surgeries, Lap. Nissen Fundiplication    IBS (irritable bowel syndrome)    Myasthenia gravis    her body mimics the disease   Peripheral neuropathy    S/P colonoscopy    Dr. Jeryl, internal hemorrhoids, repeat in Sept 2022   S/P endoscopy    Dr. Jeryl 2008: removal of impacted food bolus, Dr. Jeryl 2010: moderate gastritis, Sept 2012 with SLF: path with mild gastritis, no definite stricture noted, dilation with Savary 16 mm   Sciatica    Seizures (HCC)    Sleep apnea    Stroke (HCC)    TIA (transient ischemic attack) 2012    Past Surgical History:  Procedure Laterality Date   ABDOMINAL HYSTERECTOMY     complete per patient   BALLOON DILATION N/A 10/05/2020   Procedure: BALLOON DILATION;  Surgeon: Cindie Carlin POUR, DO;  [facility]: [facility];  Service: Endoscopy;  Laterality: N/A;   BALLOON DILATION N/A 11/29/2022   Procedure: BALLOON DILATION;  Surgeon: Cindie Carlin POUR, DO;  [facility]: [facility];  Service: Endoscopy;  Laterality: N/A;   BLADDER SURGERY     stretch bladder opening   CHOLECYSTECTOMY     COLONOSCOPY  07/18/2011   SLF: 1. internal hemorrhoids   COLONOSCOPY WITH PROPOFOL  N/A 03/21/2017   Dr. harvey: Redundant left colon, hemorrhoids, next colonoscopy in 10 years.  Patient reports deep pression after propofol .   COLONOSCOPY WITH PROPOFOL  N/A 01/26/2021   Procedure: COLONOSCOPY WITH PROPOFOL ;  Surgeon: Cindie Carlin POUR, DO;   [facility];  Service: Endoscopy;  Laterality: N/A;  am appt   ESOPHAGEAL MANOMETRY N/A 01/21/2013   esophageal surgery?     2008 [facility]   ESOPHAGOGASTRODUODENOSCOPY  07/18/2011   SLF: 1. Moderate gastritis 2. Dysphagia most likely 2o large food bolus moving through her Nissen, which appears to be intact. Pt has poor denttition and NL BPE 2 years ago. 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   ESOPHAGOGASTRODUODENOSCOPY (EGD) WITH PROPOFOL  N/A 08/21/2018   dr. harvey: mild gastritis, esophagus appeared normal. s/p dilation   ESOPHAGOGASTRODUODENOSCOPY (EGD) WITH PROPOFOL  N/A 10/05/2020   Nissen, benign-appearing stenosis s/p dilation, gastric polyps biospied and normal duodenum. Hyperplastic polyp and reactive gastropathy noted.    ESOPHAGOGASTRODUODENOSCOPY (EGD) WITH PROPOFOL  N/A 11/29/2022   Procedure: ESOPHAGOGASTRODUODENOSCOPY (EGD) WITH PROPOFOL ;  Surgeon: Cindie Carlin POUR, DO;  [facility];  Service: Endoscopy;  Laterality: N/A;  930am, asa 2   EVALUATION UNDER ANESTHESIA WITH HEMORRHOIDECTOMY N/A 03/27/2015   Procedure: EXAM UNDER ANESTHESIA WITH SPHINCTEROTOMY ;  Surgeon: Donnice Lunger, MD;  Location: [facility];  Service: General;  Laterality: N/A;   FLEXIBLE SIGMOIDOSCOPY N/A 12/09/2014   SLF: Rectal pain most likely due to internal and external hemorroids   HAND SURGERY     carpal tumnnel right and removal of cyst left   HEMORRHOID BANDING N/A 12/09/2014   Procedure: HEMORRHOID BANDING;  Surgeon: Margo LITTIE harvey, MD;  Location: [facility];  Service: Endoscopy;  Laterality: N/A;   LAPAROSCOPIC LYSIS OF ADHESIONS  12/06/2022   Procedure: XI ROBOTIC LAPAROSCOPIC LYSIS OF ADHESIONS;  Surgeon: Mavis Anes, MD;  Location: [facility];  Service: General;;   LAPAROSCOPIC NISSEN FUNDOPLICATION N/A 02/13/2013   Procedure: Redo LAPAROSCOPIC NISSEN FUNDOPLICATION;  Surgeon: Donnice KATHEE Lunger, MD;  Location: [facility];  Service: General;  Laterality: N/A;   Forgut explortation with partial  takedown nissan funliplication from 1994 for wrap torsion and persistant dysphagia   NASAL SEPTOPLASTY W/ TURBINOPLASTY Bilateral 08/03/2016   Procedure: NASAL SEPTOPLASTY WITH TURBINATE REDUCTION;  Surgeon: Daniel Moccasin, MD;  Location: [facility];  Service: ENT;  Laterality: Bilateral;   NECK SURGERY     removal of knot from neck   NISSEN FUNDOPLICATION     initially in 1994. redo in 2014   POLYPECTOMY  10/05/2020   Procedure: POLYPECTOMY;  Surgeon: Cindie Carlin POUR, DO;  [facility];  Service: Endoscopy;;   SAVORY DILATION  07/18/2011   Surgeon:Sandi M Fields   SAVORY DILATION  08/21/2018   Procedure: SAVORY DILATION;  Surgeon: harvey Margo LITTIE, MD;  [facility]: [facility];  Service: Endoscopy;;    Current Outpatient Medications  Medication Sig Dispense Refill   acetaminophen  (TYLENOL ) 650 MG CR tablet Take 1,300 mg by mouth every 8 (eight) hours as needed for pain. (Patient taking differently: Take 1,300 mg by mouth at bedtime.)     albuterol  (VENTOLIN  HFA) 108 (90 Base) MCG/ACT inhaler Inhale into the lungs every 6 (six) hours as needed  for wheezing or shortness of breath.     Ascorbic Acid (VITAMIN C GUMMIE PO) Take 2-3 tablets by mouth every evening.     Biotin w/ Vitamins C & E (HAIR SKIN & NAILS GUMMIES PO) Take 2 tablets by mouth daily.     Cholecalciferol (VITAMIN D-3 PO) Take 10,000 Units by mouth at bedtime.     Coenzyme Q10 (CO Q-10) 100 MG CHEW Chew 600 mg by mouth daily.     cyanocobalamin 1000 MCG tablet Take 1,000 mcg by mouth daily.     diclofenac Sodium (VOLTAREN) 1 % GEL Apply 1 application  topically 2 (two) times daily. (Patient taking differently: Apply 1 application  topically 2 (two) times daily. As needed.)     EPINEPHrine  0.3 mg/0.3 mL IJ SOAJ injection Inject 0.3 mg into the muscle as needed for anaphylaxis.     esomeprazole  (NEXIUM ) 40 MG capsule Take 1 capsule (40 mg total) by mouth 2 (two) times daily before a meal. 180  capsule 3   estrogens, conjugated, (PREMARIN) 0.625 MG tablet Take 0.625 mg by mouth at bedtime.      Glucosamine-Chondroitin (MOVE FREE PO) Take 1 tablet by mouth every evening.      hydrOXYzine (ATARAX/VISTARIL) 25 MG tablet Take 25 mg by mouth at bedtime.      L-Methylfolate-B6-B12 (FOLTANX) 3-35-2 MG TABS Take 1 tablet by mouth daily.     loratadine (CLARITIN) 10 MG tablet Take 10 mg by mouth every evening.     lubiprostone  ([name] ) 24 MCG capsule Take 1 capsule (24 mcg total) by mouth 2 (two) times daily with a meal. 60 capsule 11   magnesium  hydroxide (DULCOLAX) 400 MG/5ML suspension Take 15 mLs by mouth daily as needed for mild constipation.     montelukast (SINGULAIR) 10 MG tablet Take 10 mg by mouth at bedtime.     Multiple Vitamin (MULTIVITAMIN WITH MINERALS) TABS tablet Take 2 tablets by mouth every evening. Women's Multivitamin     NASACORT ALLERGY 24HR 55 MCG/ACT AERO nasal inhaler Place 1-2 sprays into the nose daily as needed (allergies).     ondansetron  (ZOFRAN ) 4 MG tablet Take 1 tablet (4 mg total) by mouth every 8 (eight) hours as needed for nausea or vomiting. 60 tablet 3   OVER THE COUNTER MEDICATION [facility] Hemorrhoid compounded cream Apply rectally up to QID. 30 gram tube with one refill called to Robin at [facility]. (Patient taking differently: as needed. [facility] Hemorrhoid compounded cream Apply rectally up to QID. 30 gram tube with one refill called to Robin at [facility].)     Polyethylene Glycol 400 (VISINE DRY EYE RELIEF OP) Place 1 drop into both eyes daily as needed (dry/ red eyes).     simethicone  (MYLICON) 125 MG chewable tablet Chew 250 mg by mouth every 6 (six) hours as needed for flatulence.     sucralfate  (CARAFATE ) 1 g tablet Take 1 tablet (1 g total) by mouth 4 (four) times daily -  with meals and at bedtime. (Patient taking differently: Take 1 g by mouth 4 (four) times daily -  with meals and at bedtime. 2-3 times per  day.) 120 tablet 11   Tiotropium Bromide Monohydrate (SPIRIVA RESPIMAT) 2.5 MCG/ACT AERS Inhale 1 each into the lungs daily as needed (shortness of breath).     tiZANidine (ZANAFLEX) 4 MG tablet Take 4 mg by mouth at bedtime.     traZODone (DESYREL) 50 MG tablet Take 25 mg by mouth at bedtime as needed  for sleep.     Turmeric (QC TUMERIC COMPLEX PO) Take 4 tablets by mouth at bedtime.     vitamin E 1000 UNIT capsule Take 1,000 Units by mouth at bedtime.      rizatriptan (MAXALT) 10 MG tablet Take 10 mg by mouth 2 (two) times daily as needed for migraine.  (Patient not taking: Reported on 03/27/2024)     No current facility-administered medications for this visit.    Allergies as of 06/26/2024 - Review Complete 06/26/2024  Allergen Reaction Noted   Aspirin Rash 07/28/2016   Avelox [moxifloxacin hcl in nacl] Anaphylaxis and Itching 06/20/2011   Linzess  [linaclotide ] Swelling 02/04/2013   Methocarbamol  11/29/2022   Penicillins Anaphylaxis and Hives    Adhesive [tape] Itching 01/24/2013   Carbamazepine Other (See Comments) 03/16/2015   Cephalexin Hives and Itching    Clindamycin/lincomycin Hives and Itching 12/17/2018   Codeine Hives and Itching    Cymbalta [duloxetine hcl] Other (See Comments) 06/20/2011   Darvon [propoxyphene] Hives 03/17/2015   Dicyclomine Itching and Other (See Comments) 06/20/2011   Dicyclomine hcl Other (See Comments) 11/12/2018   Famotidine Nausea And Vomiting 03/16/2015   Hydrocodone -acetaminophen  Itching 03/15/2017   Ketek [telithromycin] Hives and Itching 09/06/2017   Latex Itching 01/24/2013   Macrolides and ketolides Swelling 07/18/2011   Meperidine hcl Itching and Other (See Comments)    Metronidazole Hives and Other (See Comments) 06/20/2011   Morphine Itching and Other (See Comments)    Mupirocin Itching 12/17/2018   Prednisone Hives, Swelling, and Other (See Comments) 06/20/2011   Propofol  Other (See Comments) 03/17/2015   Protonix [pantoprazole]  Swelling 03/17/2015   Solodyn [minocycline hcl er] Itching and Swelling 11/21/2019   Sulfamethoxazole-trimethoprim Hives and Swelling 11/12/2018   Sulfonamide derivatives Hives    Tramadol hcl Hives and Other (See Comments)    Zylet [loteprednol-tobramycin] Hives and Swelling 03/15/2017    Family History  Problem Relation Age of Onset   Anesthesia problems Sister    Cancer Sister        breast   Hypertension Sister    Parkinson's disease Mother    Kidney disease Mother    Diabetes Mother    Heart attack Father    Cancer Father        stomach cancer   Hypertension Brother    Diabetes Brother    Arthritis Brother    Kidney disease Brother    Parkinson's disease Brother    Cancer Maternal Aunt        breast   Cancer Maternal Grandmother        breast   Multiple sclerosis Daughter    Colon cancer Cousin        4 cousins   Pseudochol deficiency Neg Hx    Hypotension Neg Hx    Malignant hyperthermia Neg Hx     Social History   Socioeconomic History   Marital status: Married    Spouse name: Not on file   Number of children: Not on file   Years of education: Not on file   Highest education level: Not on file  Occupational History   Not on file  Tobacco Use   Smoking status: Never    Passive exposure: Never   Smokeless tobacco: Never  Vaping Use   Vaping status: Never Used  Substance and Sexual Activity   Alcohol use: Not Currently    Comment: drinks wine every blue moon   Drug use: No   Sexual activity: Not Currently  Other Topics Concern  Not on file  Social History Narrative   Not on file   Social Drivers of Health   Financial Resource Strain: Not on file  Food Insecurity: Not on file  Transportation Needs: Not on file  Physical Activity: Inactive (03/14/2023)   Received from [facility]   Exercise Vital Sign    On average, how many days per week do you engage in moderate to strenuous exercise (like a brisk walk)?: 0 days    On average, how many  minutes do you engage in exercise at this level?: 0 min  Stress: Not on file  Social Connections: Not on file    Subjective: Review of Systems  Constitutional:  Negative for chills and fever.  HENT:  Negative for congestion and hearing loss.   Eyes:  Negative for blurred vision and double vision.  Respiratory:  Negative for cough and shortness of breath.   Cardiovascular:  Negative for chest pain and palpitations.  Gastrointestinal:  Positive for constipation and heartburn. Negative for abdominal pain, blood in stool, diarrhea, melena and vomiting.  Genitourinary:  Negative for dysuria and urgency.  Musculoskeletal:  Negative for joint pain and myalgias.  Skin:  Negative for itching and rash.  Neurological:  Negative for dizziness and headaches.  Psychiatric/Behavioral:  Negative for depression. The patient is not nervous/anxious.      Objective: BP 135/86 (BP Location: Left Arm, Patient Position: Sitting, Cuff Size: Normal)   Pulse 77   Temp (!) 97.5 F (36.4 C) (Temporal)   Ht 5' 3 (1.6 m)   Wt 165 lb 14.4 oz (75.3 kg)   BMI 29.39 kg/m  Physical Exam Constitutional:      Appearance: Normal appearance.  HENT:     Head: Normocephalic and atraumatic.  Eyes:     Extraocular Movements: Extraocular movements intact.     Conjunctiva/sclera: Conjunctivae normal.  Cardiovascular:     Rate and Rhythm: Normal rate and regular rhythm.  Pulmonary:     Effort: Pulmonary effort is normal.     Breath sounds: Normal breath sounds.  Abdominal:     General: Bowel sounds are normal.     Palpations: Abdomen is soft.  Musculoskeletal:        General: No swelling. Normal range of motion.     Cervical back: Normal range of motion and neck supple.  Skin:    General: Skin is warm and dry.     Coloration: Skin is not jaundiced.  Neurological:     General: No focal deficit present.     Mental Status: She is alert and oriented to person, place, and time.  Psychiatric:        Mood and  Affect: Mood normal.        Behavior: Behavior normal.      Assessment/Plan:  1.  Chronic GERD, dysphagia-recent progression of her dysphagia.  Has had good response to dilations in the past. Will schedule for EGD with possible dilation to evaluate for peptic ulcer disease, esophagitis, gastritis, H. Pylori, duodenitis, or other. Will also evaluate for esophageal stricture, Schatzki's ring, esophageal web or other.   The risks including infection, bleed, or perforation as well as benefits, limitations, alternatives and imponderables have been reviewed with the patient. Potential for esophageal dilation, biopsy, etc. have also been reviewed.  Questions have been answered. All parties agreeable.  Continue on Nexium  twice daily.  Carafate  on top of this as needed.  2.  Irritable bowel syndrome with constipation-continue on [name] .  3.  Chest  pain-she has a protuberance in between her breasts, feels like a bony protuberance.  She states it swells regularly.  May need to consider CT of her chest to further evaluate.  Follow-up after upper endoscopy.   06/26/2024 3:04 PM   Disclaimer: This note was dictated with voice recognition software. Similar sounding words can inadvertently be transcribed and may not be corrected upon review.

## 2024-06-26 NOTE — H&P (View-Only) (Signed)
 Referring Provider: Orpha Yancey LABOR, MD Primary Care Physician:  Orpha Yancey LABOR, MD Primary GI:  Dr. Cindie  Chief Complaint  Patient presents with   Follow-up    Patient says she is here for a follow up from her recent Ed visit to [facility] due to chest discomfort . Patient says she is having issues with dysphagia, nausea, epigastric pain. She has a history of Gerd, and IBS with constipation. Patient is taking Carafate  2-3 times per day, esomeprazole  40 mg bid. She uses gas x and has been having to use zofran  prn nausea.     HPI:   Yesenia Boyd is a 68 y.o. female who presents to clinic today for follow-up visit.  She has complicated past GI history including Niesen fundoplication x2, history of chronic dysphagia, constipation.    Chronic GERD: Chronically takes Nexium  twice daily.  Notes breakthrough symptoms of acid reflux and burning in her stomach. Has tried multiple PPIs prior. I gave her samples of Voquezna previously but states she did no take these because she read about possible side effects.   Was doing relatively okay though had exacerbation of her symptoms prompting ER visit at [facility].  Workup unremarkable.  Discharged home.  Feels like her dysphagia is worsening again.  Having significant reflux.  Food is getting stuck in her substernal region.  Also complaining of chest pain and swelling in between her breast.  EGD 10/05/2020 for dysphagia showed esophageal stricture status post dilation with 18 mm balloon with moderate improvement as well as mucosal disruption.  Biopsies of the stomach negative for H. pylori.  Hyperplastic polyp removed from stomach.  EGD 11/29/2022 with evidence of prior Niesen fundoplication, dilated with 20 mm balloon.  Otherwise unremarkable.  Constipation, currently taking [name]  24 mcg daily as well as liquid Dulcolax.  Previously trialed and failed Linzess  due to abdominal pain.  Continues to have issues with complete evacuation,  extended toilet time.  Also notes mild, intermittent rectal bleeding when her constipation gets bad.  History of anal stenosis in the past.  Colon cancer screening: Colonoscopy 01/26/2021 for rectal discomfort, unremarkable besides internal hemorrhoids with recommended 10-year recall.  Past Medical History:  Diagnosis Date   Arthritis    Asthma    Complication of anesthesia    Deep depression following administration of propofol    Depression    Fibromyalgia    GERD (gastroesophageal reflux disease)    History of hiatal hernia    2 surgeries, Lap. Nissen Fundiplication    IBS (irritable bowel syndrome)    Myasthenia gravis    her body mimics the disease   Peripheral neuropathy    S/P colonoscopy    Dr. Jeryl, internal hemorrhoids, repeat in Sept 2022   S/P endoscopy    Dr. Jeryl 2008: removal of impacted food bolus, Dr. Jeryl 2010: moderate gastritis, Sept 2012 with SLF: path with mild gastritis, no definite stricture noted, dilation with Savary 16 mm   Sciatica    Seizures (HCC)    Sleep apnea    Stroke (HCC)    TIA (transient ischemic attack) 2012    Past Surgical History:  Procedure Laterality Date   ABDOMINAL HYSTERECTOMY     complete per patient   BALLOON DILATION N/A 10/05/2020   Procedure: BALLOON DILATION;  Surgeon: Cindie Carlin POUR, DO;  [facility]: [facility];  Service: Endoscopy;  Laterality: N/A;   BALLOON DILATION N/A 11/29/2022   Procedure: BALLOON DILATION;  Surgeon: Cindie Carlin POUR, DO;  [facility]: [facility];  Service: Endoscopy;  Laterality: N/A;   BLADDER SURGERY     stretch bladder opening   CHOLECYSTECTOMY     COLONOSCOPY  07/18/2011   SLF: 1. internal hemorrhoids   COLONOSCOPY WITH PROPOFOL  N/A 03/21/2017   Dr. harvey: Redundant left colon, hemorrhoids, next colonoscopy in 10 years.  Patient reports deep pression after propofol .   COLONOSCOPY WITH PROPOFOL  N/A 01/26/2021   Procedure: COLONOSCOPY WITH PROPOFOL ;  Surgeon: Cindie Carlin POUR, DO;   [facility];  Service: Endoscopy;  Laterality: N/A;  am appt   ESOPHAGEAL MANOMETRY N/A 01/21/2013   esophageal surgery?     2008 [facility]   ESOPHAGOGASTRODUODENOSCOPY  07/18/2011   SLF: 1. Moderate gastritis 2. Dysphagia most likely 2o large food bolus moving through her Nissen, which appears to be intact. Pt has poor denttition and NL BPE 2 years ago. 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   ESOPHAGOGASTRODUODENOSCOPY (EGD) WITH PROPOFOL  N/A 08/21/2018   dr. harvey: mild gastritis, esophagus appeared normal. s/p dilation   ESOPHAGOGASTRODUODENOSCOPY (EGD) WITH PROPOFOL  N/A 10/05/2020   Nissen, benign-appearing stenosis s/p dilation, gastric polyps biospied and normal duodenum. Hyperplastic polyp and reactive gastropathy noted.    ESOPHAGOGASTRODUODENOSCOPY (EGD) WITH PROPOFOL  N/A 11/29/2022   Procedure: ESOPHAGOGASTRODUODENOSCOPY (EGD) WITH PROPOFOL ;  Surgeon: Cindie Carlin POUR, DO;  [facility];  Service: Endoscopy;  Laterality: N/A;  930am, asa 2   EVALUATION UNDER ANESTHESIA WITH HEMORRHOIDECTOMY N/A 03/27/2015   Procedure: EXAM UNDER ANESTHESIA WITH SPHINCTEROTOMY ;  Surgeon: Donnice Lunger, MD;  Location: [facility];  Service: General;  Laterality: N/A;   FLEXIBLE SIGMOIDOSCOPY N/A 12/09/2014   SLF: Rectal pain most likely due to internal and external hemorroids   HAND SURGERY     carpal tumnnel right and removal of cyst left   HEMORRHOID BANDING N/A 12/09/2014   Procedure: HEMORRHOID BANDING;  Surgeon: Margo LITTIE harvey, MD;  Location: [facility];  Service: Endoscopy;  Laterality: N/A;   LAPAROSCOPIC LYSIS OF ADHESIONS  12/06/2022   Procedure: XI ROBOTIC LAPAROSCOPIC LYSIS OF ADHESIONS;  Surgeon: Mavis Anes, MD;  Location: [facility];  Service: General;;   LAPAROSCOPIC NISSEN FUNDOPLICATION N/A 02/13/2013   Procedure: Redo LAPAROSCOPIC NISSEN FUNDOPLICATION;  Surgeon: Donnice KATHEE Lunger, MD;  Location: [facility];  Service: General;  Laterality: N/A;   Forgut explortation with partial  takedown nissan funliplication from 1994 for wrap torsion and persistant dysphagia   NASAL SEPTOPLASTY W/ TURBINOPLASTY Bilateral 08/03/2016   Procedure: NASAL SEPTOPLASTY WITH TURBINATE REDUCTION;  Surgeon: Daniel Moccasin, MD;  Location: [facility];  Service: ENT;  Laterality: Bilateral;   NECK SURGERY     removal of knot from neck   NISSEN FUNDOPLICATION     initially in 1994. redo in 2014   POLYPECTOMY  10/05/2020   Procedure: POLYPECTOMY;  Surgeon: Cindie Carlin POUR, DO;  [facility];  Service: Endoscopy;;   SAVORY DILATION  07/18/2011   Surgeon:Sandi M Fields   SAVORY DILATION  08/21/2018   Procedure: SAVORY DILATION;  Surgeon: harvey Margo LITTIE, MD;  [facility]: [facility];  Service: Endoscopy;;    Current Outpatient Medications  Medication Sig Dispense Refill   acetaminophen  (TYLENOL ) 650 MG CR tablet Take 1,300 mg by mouth every 8 (eight) hours as needed for pain. (Patient taking differently: Take 1,300 mg by mouth at bedtime.)     albuterol  (VENTOLIN  HFA) 108 (90 Base) MCG/ACT inhaler Inhale into the lungs every 6 (six) hours as needed  for wheezing or shortness of breath.     Ascorbic Acid (VITAMIN C GUMMIE PO) Take 2-3 tablets by mouth every evening.     Biotin w/ Vitamins C & E (HAIR SKIN & NAILS GUMMIES PO) Take 2 tablets by mouth daily.     Cholecalciferol (VITAMIN D-3 PO) Take 10,000 Units by mouth at bedtime.     Coenzyme Q10 (CO Q-10) 100 MG CHEW Chew 600 mg by mouth daily.     cyanocobalamin 1000 MCG tablet Take 1,000 mcg by mouth daily.     diclofenac Sodium (VOLTAREN) 1 % GEL Apply 1 application  topically 2 (two) times daily. (Patient taking differently: Apply 1 application  topically 2 (two) times daily. As needed.)     EPINEPHrine  0.3 mg/0.3 mL IJ SOAJ injection Inject 0.3 mg into the muscle as needed for anaphylaxis.     esomeprazole  (NEXIUM ) 40 MG capsule Take 1 capsule (40 mg total) by mouth 2 (two) times daily before a meal. 180  capsule 3   estrogens, conjugated, (PREMARIN) 0.625 MG tablet Take 0.625 mg by mouth at bedtime.      Glucosamine-Chondroitin (MOVE FREE PO) Take 1 tablet by mouth every evening.      hydrOXYzine (ATARAX/VISTARIL) 25 MG tablet Take 25 mg by mouth at bedtime.      L-Methylfolate-B6-B12 (FOLTANX) 3-35-2 MG TABS Take 1 tablet by mouth daily.     loratadine (CLARITIN) 10 MG tablet Take 10 mg by mouth every evening.     lubiprostone  ([name] ) 24 MCG capsule Take 1 capsule (24 mcg total) by mouth 2 (two) times daily with a meal. 60 capsule 11   magnesium  hydroxide (DULCOLAX) 400 MG/5ML suspension Take 15 mLs by mouth daily as needed for mild constipation.     montelukast (SINGULAIR) 10 MG tablet Take 10 mg by mouth at bedtime.     Multiple Vitamin (MULTIVITAMIN WITH MINERALS) TABS tablet Take 2 tablets by mouth every evening. Women's Multivitamin     NASACORT ALLERGY 24HR 55 MCG/ACT AERO nasal inhaler Place 1-2 sprays into the nose daily as needed (allergies).     ondansetron  (ZOFRAN ) 4 MG tablet Take 1 tablet (4 mg total) by mouth every 8 (eight) hours as needed for nausea or vomiting. 60 tablet 3   OVER THE COUNTER MEDICATION [facility] Hemorrhoid compounded cream Apply rectally up to QID. 30 gram tube with one refill called to Robin at [facility]. (Patient taking differently: as needed. [facility] Hemorrhoid compounded cream Apply rectally up to QID. 30 gram tube with one refill called to Robin at [facility].)     Polyethylene Glycol 400 (VISINE DRY EYE RELIEF OP) Place 1 drop into both eyes daily as needed (dry/ red eyes).     simethicone  (MYLICON) 125 MG chewable tablet Chew 250 mg by mouth every 6 (six) hours as needed for flatulence.     sucralfate  (CARAFATE ) 1 g tablet Take 1 tablet (1 g total) by mouth 4 (four) times daily -  with meals and at bedtime. (Patient taking differently: Take 1 g by mouth 4 (four) times daily -  with meals and at bedtime. 2-3 times per  day.) 120 tablet 11   Tiotropium Bromide Monohydrate (SPIRIVA RESPIMAT) 2.5 MCG/ACT AERS Inhale 1 each into the lungs daily as needed (shortness of breath).     tiZANidine (ZANAFLEX) 4 MG tablet Take 4 mg by mouth at bedtime.     traZODone (DESYREL) 50 MG tablet Take 25 mg by mouth at bedtime as needed  for sleep.     Turmeric (QC TUMERIC COMPLEX PO) Take 4 tablets by mouth at bedtime.     vitamin E 1000 UNIT capsule Take 1,000 Units by mouth at bedtime.      rizatriptan (MAXALT) 10 MG tablet Take 10 mg by mouth 2 (two) times daily as needed for migraine.  (Patient not taking: Reported on 03/27/2024)     No current facility-administered medications for this visit.    Allergies as of 06/26/2024 - Review Complete 06/26/2024  Allergen Reaction Noted   Aspirin Rash 07/28/2016   Avelox [moxifloxacin hcl in nacl] Anaphylaxis and Itching 06/20/2011   Linzess  [linaclotide ] Swelling 02/04/2013   Methocarbamol  11/29/2022   Penicillins Anaphylaxis and Hives    Adhesive [tape] Itching 01/24/2013   Carbamazepine Other (See Comments) 03/16/2015   Cephalexin Hives and Itching    Clindamycin/lincomycin Hives and Itching 12/17/2018   Codeine Hives and Itching    Cymbalta [duloxetine hcl] Other (See Comments) 06/20/2011   Darvon [propoxyphene] Hives 03/17/2015   Dicyclomine Itching and Other (See Comments) 06/20/2011   Dicyclomine hcl Other (See Comments) 11/12/2018   Famotidine Nausea And Vomiting 03/16/2015   Hydrocodone -acetaminophen  Itching 03/15/2017   Ketek [telithromycin] Hives and Itching 09/06/2017   Latex Itching 01/24/2013   Macrolides and ketolides Swelling 07/18/2011   Meperidine hcl Itching and Other (See Comments)    Metronidazole Hives and Other (See Comments) 06/20/2011   Morphine Itching and Other (See Comments)    Mupirocin Itching 12/17/2018   Prednisone Hives, Swelling, and Other (See Comments) 06/20/2011   Propofol  Other (See Comments) 03/17/2015   Protonix [pantoprazole]  Swelling 03/17/2015   Solodyn [minocycline hcl er] Itching and Swelling 11/21/2019   Sulfamethoxazole-trimethoprim Hives and Swelling 11/12/2018   Sulfonamide derivatives Hives    Tramadol hcl Hives and Other (See Comments)    Zylet [loteprednol-tobramycin] Hives and Swelling 03/15/2017    Family History  Problem Relation Age of Onset   Anesthesia problems Sister    Cancer Sister        breast   Hypertension Sister    Parkinson's disease Mother    Kidney disease Mother    Diabetes Mother    Heart attack Father    Cancer Father        stomach cancer   Hypertension Brother    Diabetes Brother    Arthritis Brother    Kidney disease Brother    Parkinson's disease Brother    Cancer Maternal Aunt        breast   Cancer Maternal Grandmother        breast   Multiple sclerosis Daughter    Colon cancer Cousin        4 cousins   Pseudochol deficiency Neg Hx    Hypotension Neg Hx    Malignant hyperthermia Neg Hx     Social History   Socioeconomic History   Marital status: Married    Spouse name: Not on file   Number of children: Not on file   Years of education: Not on file   Highest education level: Not on file  Occupational History   Not on file  Tobacco Use   Smoking status: Never    Passive exposure: Never   Smokeless tobacco: Never  Vaping Use   Vaping status: Never Used  Substance and Sexual Activity   Alcohol use: Not Currently    Comment: drinks wine every blue moon   Drug use: No   Sexual activity: Not Currently  Other Topics Concern  Not on file  Social History Narrative   Not on file   Social Drivers of Health   Financial Resource Strain: Not on file  Food Insecurity: Not on file  Transportation Needs: Not on file  Physical Activity: Inactive (03/14/2023)   Received from [facility]   Exercise Vital Sign    On average, how many days per week do you engage in moderate to strenuous exercise (like a brisk walk)?: 0 days    On average, how many  minutes do you engage in exercise at this level?: 0 min  Stress: Not on file  Social Connections: Not on file    Subjective: Review of Systems  Constitutional:  Negative for chills and fever.  HENT:  Negative for congestion and hearing loss.   Eyes:  Negative for blurred vision and double vision.  Respiratory:  Negative for cough and shortness of breath.   Cardiovascular:  Negative for chest pain and palpitations.  Gastrointestinal:  Positive for constipation and heartburn. Negative for abdominal pain, blood in stool, diarrhea, melena and vomiting.  Genitourinary:  Negative for dysuria and urgency.  Musculoskeletal:  Negative for joint pain and myalgias.  Skin:  Negative for itching and rash.  Neurological:  Negative for dizziness and headaches.  Psychiatric/Behavioral:  Negative for depression. The patient is not nervous/anxious.      Objective: BP 135/86 (BP Location: Left Arm, Patient Position: Sitting, Cuff Size: Normal)   Pulse 77   Temp (!) 97.5 F (36.4 C) (Temporal)   Ht 5' 3 (1.6 m)   Wt 165 lb 14.4 oz (75.3 kg)   BMI 29.39 kg/m  Physical Exam Constitutional:      Appearance: Normal appearance.  HENT:     Head: Normocephalic and atraumatic.  Eyes:     Extraocular Movements: Extraocular movements intact.     Conjunctiva/sclera: Conjunctivae normal.  Cardiovascular:     Rate and Rhythm: Normal rate and regular rhythm.  Pulmonary:     Effort: Pulmonary effort is normal.     Breath sounds: Normal breath sounds.  Abdominal:     General: Bowel sounds are normal.     Palpations: Abdomen is soft.  Musculoskeletal:        General: No swelling. Normal range of motion.     Cervical back: Normal range of motion and neck supple.  Skin:    General: Skin is warm and dry.     Coloration: Skin is not jaundiced.  Neurological:     General: No focal deficit present.     Mental Status: She is alert and oriented to person, place, and time.  Psychiatric:        Mood and  Affect: Mood normal.        Behavior: Behavior normal.      Assessment/Plan:  1.  Chronic GERD, dysphagia-recent progression of her dysphagia.  Has had good response to dilations in the past. Will schedule for EGD with possible dilation to evaluate for peptic ulcer disease, esophagitis, gastritis, H. Pylori, duodenitis, or other. Will also evaluate for esophageal stricture, Schatzki's ring, esophageal web or other.   The risks including infection, bleed, or perforation as well as benefits, limitations, alternatives and imponderables have been reviewed with the patient. Potential for esophageal dilation, biopsy, etc. have also been reviewed.  Questions have been answered. All parties agreeable.  Continue on Nexium  twice daily.  Carafate  on top of this as needed.  2.  Irritable bowel syndrome with constipation-continue on [name] .  3.  Chest  pain-she has a protuberance in between her breasts, feels like a bony protuberance.  She states it swells regularly.  May need to consider CT of her chest to further evaluate.  Follow-up after upper endoscopy.   06/26/2024 3:04 PM   Disclaimer: This note was dictated with voice recognition software. Similar sounding words can inadvertently be transcribed and may not be corrected upon review.

## 2024-06-26 NOTE — Patient Instructions (Signed)
 We will schedule you for upper endoscopy to further evaluate your symptoms.  I will stretch your esophagus.  If this does not help then we will consider CT scan of your chest.  It is always a pleasure seeing you.  Dr. Cindie

## 2024-06-27 ENCOUNTER — Telehealth: Payer: Self-pay | Admitting: *Deleted

## 2024-06-27 ENCOUNTER — Encounter: Payer: Self-pay | Admitting: *Deleted

## 2024-06-27 DIAGNOSIS — Z6827 Body mass index (BMI) 27.0-27.9, adult: Secondary | ICD-10-CM | POA: Diagnosis not present

## 2024-06-27 DIAGNOSIS — R1084 Generalized abdominal pain: Secondary | ICD-10-CM | POA: Diagnosis not present

## 2024-06-27 DIAGNOSIS — F451 Undifferentiated somatoform disorder: Secondary | ICD-10-CM | POA: Diagnosis not present

## 2024-06-27 DIAGNOSIS — K219 Gastro-esophageal reflux disease without esophagitis: Secondary | ICD-10-CM | POA: Diagnosis not present

## 2024-06-27 DIAGNOSIS — E7849 Other hyperlipidemia: Secondary | ICD-10-CM | POA: Diagnosis not present

## 2024-06-27 DIAGNOSIS — N182 Chronic kidney disease, stage 2 (mild): Secondary | ICD-10-CM | POA: Diagnosis not present

## 2024-06-27 DIAGNOSIS — F408 Other phobic anxiety disorders: Secondary | ICD-10-CM | POA: Diagnosis not present

## 2024-06-27 NOTE — Telephone Encounter (Signed)
 [employer] PA: Approved Authorization #[id number]  Tracking #[id number] Dates of service 07/11/2024 - 10/10/2024

## 2024-07-10 NOTE — Anesthesia Preprocedure Evaluation (Signed)
 Anesthesia Evaluation  Patient identified by MRN, date of birth, ID band Patient awake    Reviewed: Allergy & Precautions, H&P , NPO status , Patient's Chart, lab work & pertinent test results  History of Anesthesia Complications (+) history of anesthetic complications  Airway Mallampati: II  TM Distance: >3 FB Neck ROM: Full    Dental  (+) Dental Advisory Given, Missing,    Pulmonary asthma , sleep apnea    Pulmonary exam normal breath sounds clear to auscultation       Cardiovascular negative cardio ROS Normal cardiovascular exam Rhythm:Regular Rate:Normal     Neuro/Psych Seizures - (not on meds),  PSYCHIATRIC DISORDERS  Depression    Neonatal Myasthenia gravis. Peripheral neuropathy TIA Neuromuscular disease CVA    GI/Hepatic Neg liver ROS, hiatal hernia,GERD  Medicated,,  Endo/Other  negative endocrine ROS    Renal/GU negative Renal ROS  negative genitourinary   Musculoskeletal  (+) Arthritis , Osteoarthritis,  Fibromyalgia -  Abdominal   Peds negative pediatric ROS (+)  Hematology negative hematology ROS (+)   Anesthesia Other Findings   Reproductive/Obstetrics negative OB ROS                              Anesthesia Physical Anesthesia Plan  ASA: 3  Anesthesia Plan: General   Post-op Pain Management: Minimal or no pain anticipated   Induction: Intravenous  PONV Risk Score and Plan: Propofol  infusion  Airway Management Planned: Nasal Cannula and Natural Airway  Additional Equipment: None  Intra-op Plan:   Post-operative Plan:   Informed Consent: I have reviewed the patients History and Physical, chart, labs and discussed the procedure including the risks, benefits and alternatives for the proposed anesthesia with the patient or authorized representative who has indicated his/her understanding and acceptance.     Dental advisory given  Plan Discussed with: CRNA  and Surgeon  Anesthesia Plan Comments: (Propofol  makes her laugh and depressed)        Anesthesia Quick Evaluation

## 2024-07-11 ENCOUNTER — Other Ambulatory Visit: Payer: Self-pay

## 2024-07-11 ENCOUNTER — Encounter (HOSPITAL_COMMUNITY)
Admission: RE | Disposition: A | Discharge status: Home / Self Care | Payer: Self-pay | Source: Home / Self Care | Attending: Internal Medicine

## 2024-07-11 ENCOUNTER — Encounter (HOSPITAL_COMMUNITY): Payer: Self-pay | Admitting: Internal Medicine

## 2024-07-11 ENCOUNTER — Ambulatory Visit (HOSPITAL_COMMUNITY): Payer: Self-pay | Admitting: Anesthesiology

## 2024-07-11 ENCOUNTER — Ambulatory Visit (HOSPITAL_COMMUNITY)
Admission: RE | Admit: 2024-07-11 | Discharge: 2024-07-11 | Disposition: A | Discharge status: Home / Self Care | Attending: Internal Medicine | Admitting: Internal Medicine

## 2024-07-11 DIAGNOSIS — Z8673 Personal history of transient ischemic attack (TIA), and cerebral infarction without residual deficits: Secondary | ICD-10-CM | POA: Insufficient documentation

## 2024-07-11 DIAGNOSIS — G629 Polyneuropathy, unspecified: Secondary | ICD-10-CM | POA: Insufficient documentation

## 2024-07-11 DIAGNOSIS — F32A Depression, unspecified: Secondary | ICD-10-CM

## 2024-07-11 DIAGNOSIS — J45909 Unspecified asthma, uncomplicated: Secondary | ICD-10-CM

## 2024-07-11 DIAGNOSIS — R12 Heartburn: Secondary | ICD-10-CM | POA: Diagnosis not present

## 2024-07-11 DIAGNOSIS — R569 Unspecified convulsions: Secondary | ICD-10-CM | POA: Insufficient documentation

## 2024-07-11 DIAGNOSIS — T182XXA Foreign body in stomach, initial encounter: Secondary | ICD-10-CM | POA: Diagnosis not present

## 2024-07-11 DIAGNOSIS — R1310 Dysphagia, unspecified: Secondary | ICD-10-CM | POA: Diagnosis not present

## 2024-07-11 DIAGNOSIS — Z833 Family history of diabetes mellitus: Secondary | ICD-10-CM | POA: Insufficient documentation

## 2024-07-11 DIAGNOSIS — Z79899 Other long term (current) drug therapy: Secondary | ICD-10-CM | POA: Insufficient documentation

## 2024-07-11 DIAGNOSIS — M797 Fibromyalgia: Secondary | ICD-10-CM | POA: Diagnosis not present

## 2024-07-11 DIAGNOSIS — G4730 Sleep apnea, unspecified: Secondary | ICD-10-CM | POA: Diagnosis not present

## 2024-07-11 DIAGNOSIS — M1990 Unspecified osteoarthritis, unspecified site: Secondary | ICD-10-CM | POA: Diagnosis not present

## 2024-07-11 DIAGNOSIS — Z860102 Personal history of hyperplastic colon polyps: Secondary | ICD-10-CM | POA: Diagnosis not present

## 2024-07-11 DIAGNOSIS — Z9889 Other specified postprocedural states: Secondary | ICD-10-CM | POA: Insufficient documentation

## 2024-07-11 DIAGNOSIS — K219 Gastro-esophageal reflux disease without esophagitis: Secondary | ICD-10-CM | POA: Insufficient documentation

## 2024-07-11 DIAGNOSIS — K581 Irritable bowel syndrome with constipation: Secondary | ICD-10-CM | POA: Insufficient documentation

## 2024-07-11 DIAGNOSIS — Z791 Long term (current) use of non-steroidal anti-inflammatories (NSAID): Secondary | ICD-10-CM | POA: Insufficient documentation

## 2024-07-11 DIAGNOSIS — K449 Diaphragmatic hernia without obstruction or gangrene: Secondary | ICD-10-CM | POA: Diagnosis not present

## 2024-07-11 HISTORY — PX: ESOPHAGOGASTRODUODENOSCOPY: SHX5428

## 2024-07-11 HISTORY — PX: ESOPHAGEAL DILATION: SHX303

## 2024-07-11 SURGERY — EGD (ESOPHAGOGASTRODUODENOSCOPY)
Anesthesia: General

## 2024-07-11 MED ORDER — LACTATED RINGERS IV SOLN: INTRAVENOUS | Status: DC

## 2024-07-11 MED ORDER — LIDOCAINE 2% (20 MG/ML) 5 ML SYRINGE
INTRAMUSCULAR | Status: DC | PRN
Start: 2024-07-11 — End: 2024-07-11
  Administered 2024-07-11: 40 mg via INTRAVENOUS

## 2024-07-11 MED ORDER — MIDAZOLAM HCL 2 MG/2ML IJ SOLN
INTRAMUSCULAR | Status: AC
  Filled 2024-07-11: qty 2

## 2024-07-11 MED ORDER — MIDAZOLAM HCL 2 MG/2ML IJ SOLN
INTRAMUSCULAR | Status: DC | PRN
  Administered 2024-07-11: 2 mg via INTRAVENOUS

## 2024-07-11 MED ORDER — PROPOFOL 10 MG/ML IV BOLUS
INTRAVENOUS | Status: DC | PRN
Start: 2024-07-11 — End: 2024-07-11
  Administered 2024-07-11: 125 ug/kg/min via INTRAVENOUS
  Administered 2024-07-11: 40 mg via INTRAVENOUS
  Administered 2024-07-11: 60 mg via INTRAVENOUS

## 2024-07-11 NOTE — Anesthesia Postprocedure Evaluation (Signed)
 Anesthesia Post Note  Patient: Yesenia Boyd  Procedure(s) Performed: EGD (ESOPHAGOGASTRODUODENOSCOPY) DILATION, ESOPHAGUS  Patient location during evaluation: Endoscopy Anesthesia Type: General Level of consciousness: awake and alert Pain management: pain level controlled Vital Signs Assessment: post-procedure vital signs reviewed and stable Respiratory status: spontaneous breathing, nonlabored ventilation and respiratory function stable Cardiovascular status: stable Anesthetic complications: no   There were no known notable events for this encounter.   Last Vitals:  Vitals:   07/11/24 0900 07/11/24 1032  BP: 132/78 (!) 118/58  Pulse: 73 67  Resp: 18 18  Temp: 36.5 C 36.5 C  SpO2: 100% 99%    Last Pain:  Vitals:   07/11/24 1033  TempSrc:   PainSc: 0-No pain                 Maquita Sandoval L Obinna Ehresman

## 2024-07-11 NOTE — Transfer of Care (Addendum)
 Immediate Anesthesia Transfer of Care Note  Patient: Yesenia Boyd  Procedure(s) Performed: EGD (ESOPHAGOGASTRODUODENOSCOPY) DILATION, ESOPHAGUS  Patient Location: Endoscopy Unit  Anesthesia Type:General  Level of Consciousness: drowsy and patient cooperative  Airway & Oxygen Therapy: Patient Spontanous Breathing  Post-op Assessment: Report given to RN and Post -op Vital signs reviewed and stable  Post vital signs: Reviewed and stable  Last Vitals:  Vitals Value Taken Time  BP 118/58 07/11/24   1032  Temp 36.5 07/11/24   1032  Pulse 67 07/11/24   1032  Resp 18 07/11/24   1032  SpO2 99% 07/11/24   1032    Last Pain:  Vitals:   07/11/24 1018  TempSrc:   PainSc: 8       Patients Stated Pain Goal: 7 (07/11/24 0900)  Complications: No notable events documented.

## 2024-07-11 NOTE — Interval H&P Note (Signed)
 History and Physical Interval Note:  07/11/2024 10:06 AM  Yesenia Boyd  has presented today for surgery, with the diagnosis of dysphagia.  The various methods of treatment have been discussed with the patient and family. After consideration of risks, benefits and other options for treatment, the patient has consented to  Procedure(s) with comments: EGD (ESOPHAGOGASTRODUODENOSCOPY) (N/A) - 10:45 am, ok rm 1-2 DILATION, ESOPHAGUS (N/A) as a surgical intervention.  The patient's history has been reviewed, patient examined, no change in status, stable for surgery.  I have reviewed the patient's chart and labs.  Questions were answered to the patient's satisfaction.     Carlin MARLA Hasty

## 2024-07-11 NOTE — Anesthesia Procedure Notes (Signed)
 Date/Time: 07/11/2024 10:19 AM  Performed by: Para Jerelene CROME, CRNAOxygen Delivery Method: Nasal cannula

## 2024-07-11 NOTE — Discharge Instructions (Addendum)
 EGD Discharge instructions Please read the instructions outlined below and refer to this sheet in the next few weeks. These discharge instructions provide you with general information on caring for yourself after you leave the hospital. Your doctor may also give you specific instructions. While your treatment has been planned according to the most current medical practices available, unavoidable complications occasionally occur. If you have any problems or questions after discharge, please call your doctor. ACTIVITY You may resume your regular activity but move at a slower pace for the next 24 hours.  Take frequent rest periods for the next 24 hours.  Walking will help expel (get rid of) the air and reduce the bloated feeling in your abdomen.  No driving for 24 hours (because of the anesthesia (medicine) used during the test).  You may shower.  Do not sign any important legal documents or operate any machinery for 24 hours (because of the anesthesia used during the test).  NUTRITION Drink plenty of fluids.  You may resume your normal diet.  Begin with a light meal and progress to your normal diet.  Avoid alcoholic beverages for 24 hours or as instructed by your caregiver.  MEDICATIONS You may resume your normal medications unless your caregiver tells you otherwise.  WHAT YOU CAN EXPECT TODAY You may experience abdominal discomfort such as a feeling of fullness or "gas" pains.  FOLLOW-UP Your doctor will discuss the results of your test with you.  SEEK IMMEDIATE MEDICAL ATTENTION IF ANY OF THE FOLLOWING OCCUR: Excessive nausea (feeling sick to your stomach) and/or vomiting.  Severe abdominal pain and distention (swelling).  Trouble swallowing.  Temperature over 101 F (37.8 C).  Rectal bleeding or vomiting of blood.   Your upper endoscopy revealed previous Niesen fundoplication intact.  It was tight today.  I stretched this out.  Hopefully this helps with your symptoms.    You also had  medium amount of food in your stomach. This raises the question of gastroparesis (delayed emptying of the stomach). We may consider further work up for this.   Continue current medications.   Follow up in GI office in 2-3 months. Message sent to the office and will call you to schedule an appointment.    I hope you have a great rest of your week!  Carlin POUR. Cindie, D.O. Gastroenterology and Hepatology [facility]

## 2024-07-11 NOTE — Op Note (Addendum)
 [facility] Patient Name: Yesenia Boyd Procedure Date: 07/11/2024 10:09 AM MRN: [id number] Date of Birth: Apr 03, 1956 Attending MD: Carlin POUR. Cindie , [place], [id number] CSN: [id number] Age: 68 Admit Type: Outpatient Procedure:                Upper GI endoscopy Indications:              Dysphagia, Heartburn Providers:                Carlin POUR. Cindie, DO, Tammy Vaught, RN, Devere Lodge Referring MD:              Medicines:                See the Anesthesia note for documentation of the                            administered medications Complications:            No immediate complications. Estimated Blood Loss:     Estimated blood loss was minimal. Procedure:                Pre-Anesthesia Assessment:                           - The anesthesia plan was to use monitored                            anesthesia care (MAC).                           After obtaining informed consent, the endoscope was                            passed under direct vision. Throughout the                            procedure, the patient's blood pressure, pulse, and                            oxygen saturations were monitored continuously. The                            [id number]-[id number] ([id number]) Upper was introduced through                            the mouth, and advanced to the second part of                            duodenum. The upper GI endoscopy was accomplished                            without difficulty. The patient tolerated the                            procedure well. Scope In: 10:23:04 AM Scope Out: 10:27:05 AM Total Procedure Duration: 0 hours 4 minutes 1 second  Findings:      Prior Nissen fundoplication was found at the gastroesophageal  junction.       This was characterized by an intact appearance, mildly tight. A TTS       dilator was passed through the scope. Dilation with an 18-19-20 mm       balloon dilator was performed to 20 mm. The dilation site was examined       and showed mild  mucosal disruption and moderate improvement in luminal       narrowing.      A medium amount of food (residue) was found in the gastric body.      The duodenal bulb, first portion of the duodenum and second portion of       the duodenum were normal. Impression:               - Nissen fundoplication was found, characterized by                            an intact appearance. Dilated.                           - A medium amount of food (residue) in the stomach.                            Stomach incompletely visualized.                           - Normal duodenal bulb, first portion of the                            duodenum and second portion of the duodenum.                           - No specimens collected. Moderate Sedation:      Per Anesthesia Care Recommendation:           - Patient has a contact number available for                            emergencies. The signs and symptoms of potential                            delayed complications were discussed with the                            patient. Return to normal activities tomorrow.                            Written discharge instructions were provided to the                            patient.                           - Resume previous diet.                           - Continue present medications.                           -  Repeat upper endoscopy PRN for retreatment.                           - Return to GI clinic in 3 months.                           - Food in stomach raises question of delyaed                            gastric emptying. Procedure Code(s):        --- Professional ---                           [phone], Esophagogastroduodenoscopy, flexible,                            transoral; with transendoscopic balloon dilation of                            esophagus (less than 30 mm diameter) Diagnosis Code(s):        --- Professional ---                           S01.109, Other specified postprocedural states                            R13.10, Dysphagia, unspecified                           R12, Heartburn CPT copyright 2022 [employer]. All rights reserved. The codes documented in this report are preliminary and upon coder review may  be revised to meet current compliance requirements. Carlin POUR. Cindie, DO Carlin POUR. Jyles Sontag, DO 07/11/2024 10:30:51 AM This report has been signed electronically. Number of Addenda: 0

## 2024-07-15 ENCOUNTER — Encounter (HOSPITAL_COMMUNITY): Payer: Self-pay | Admitting: Internal Medicine

## 2024-08-01 DIAGNOSIS — F408 Other phobic anxiety disorders: Secondary | ICD-10-CM | POA: Diagnosis not present

## 2024-08-01 DIAGNOSIS — E7849 Other hyperlipidemia: Secondary | ICD-10-CM | POA: Diagnosis not present

## 2024-08-01 DIAGNOSIS — K219 Gastro-esophageal reflux disease without esophagitis: Secondary | ICD-10-CM | POA: Diagnosis not present

## 2024-08-01 DIAGNOSIS — Z6827 Body mass index (BMI) 27.0-27.9, adult: Secondary | ICD-10-CM | POA: Diagnosis not present

## 2024-08-01 DIAGNOSIS — F451 Undifferentiated somatoform disorder: Secondary | ICD-10-CM | POA: Diagnosis not present

## 2024-08-01 DIAGNOSIS — N182 Chronic kidney disease, stage 2 (mild): Secondary | ICD-10-CM | POA: Diagnosis not present

## 2024-08-01 DIAGNOSIS — R1084 Generalized abdominal pain: Secondary | ICD-10-CM | POA: Diagnosis not present

## 2024-08-01 DIAGNOSIS — J302 Other seasonal allergic rhinitis: Secondary | ICD-10-CM | POA: Diagnosis not present

## 2024-08-05 DIAGNOSIS — K219 Gastro-esophageal reflux disease without esophagitis: Secondary | ICD-10-CM | POA: Diagnosis not present

## 2024-08-05 DIAGNOSIS — N182 Chronic kidney disease, stage 2 (mild): Secondary | ICD-10-CM | POA: Diagnosis not present

## 2024-09-13 DIAGNOSIS — N182 Chronic kidney disease, stage 2 (mild): Secondary | ICD-10-CM | POA: Diagnosis not present

## 2024-09-13 DIAGNOSIS — K219 Gastro-esophageal reflux disease without esophagitis: Secondary | ICD-10-CM | POA: Diagnosis not present

## 2024-09-26 DIAGNOSIS — N182 Chronic kidney disease, stage 2 (mild): Secondary | ICD-10-CM | POA: Diagnosis not present

## 2024-09-26 DIAGNOSIS — Z6827 Body mass index (BMI) 27.0-27.9, adult: Secondary | ICD-10-CM | POA: Diagnosis not present

## 2024-09-26 DIAGNOSIS — F408 Other phobic anxiety disorders: Secondary | ICD-10-CM | POA: Diagnosis not present

## 2024-09-26 DIAGNOSIS — M25561 Pain in right knee: Secondary | ICD-10-CM | POA: Diagnosis not present

## 2024-09-26 DIAGNOSIS — J302 Other seasonal allergic rhinitis: Secondary | ICD-10-CM | POA: Diagnosis not present

## 2024-09-26 DIAGNOSIS — R1084 Generalized abdominal pain: Secondary | ICD-10-CM | POA: Diagnosis not present

## 2024-09-26 DIAGNOSIS — F451 Undifferentiated somatoform disorder: Secondary | ICD-10-CM | POA: Diagnosis not present

## 2024-09-26 DIAGNOSIS — K219 Gastro-esophageal reflux disease without esophagitis: Secondary | ICD-10-CM | POA: Diagnosis not present

## 2024-09-26 DIAGNOSIS — E7849 Other hyperlipidemia: Secondary | ICD-10-CM | POA: Diagnosis not present

## 2024-10-01 ENCOUNTER — Encounter: Payer: Self-pay | Admitting: Internal Medicine

## 2024-11-11 ENCOUNTER — Telehealth: Payer: Self-pay

## 2024-11-11 NOTE — Telephone Encounter (Signed)
 Error

## 2024-12-10 ENCOUNTER — Telehealth: Payer: Self-pay | Admitting: *Deleted
